# Patient Record
Sex: Female | Born: 1990 | Race: White | Hispanic: No | Smoking: Former smoker
Health system: Southern US, Community
[De-identification: ages and names within clinical notes are randomized; demographics above are authoritative.]

## PROBLEM LIST (undated history)

## (undated) ENCOUNTER — Inpatient Hospital Stay: Payer: Self-pay

## (undated) ENCOUNTER — Inpatient Hospital Stay (HOSPITAL_COMMUNITY): Payer: Self-pay

## (undated) DIAGNOSIS — J45909 Unspecified asthma, uncomplicated: Secondary | ICD-10-CM

## (undated) DIAGNOSIS — D649 Anemia, unspecified: Secondary | ICD-10-CM

## (undated) DIAGNOSIS — R569 Unspecified convulsions: Secondary | ICD-10-CM

## (undated) DIAGNOSIS — F1910 Other psychoactive substance abuse, uncomplicated: Secondary | ICD-10-CM

## (undated) DIAGNOSIS — N809 Endometriosis, unspecified: Secondary | ICD-10-CM

## (undated) DIAGNOSIS — N83209 Unspecified ovarian cyst, unspecified side: Secondary | ICD-10-CM

## (undated) HISTORY — PX: LASER ABLATION/CAUTERIZATION OF ENDOMETRIAL IMPLANTS: SHX1951

## (undated) HISTORY — DX: Anemia, unspecified: D64.9

---
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
## 2010-05-29 ENCOUNTER — Emergency Department: Payer: Self-pay | Admitting: Emergency Medicine

## 2010-07-09 ENCOUNTER — Emergency Department: Payer: Self-pay | Admitting: Emergency Medicine

## 2011-01-29 ENCOUNTER — Emergency Department: Payer: Self-pay | Admitting: Emergency Medicine

## 2011-03-16 ENCOUNTER — Emergency Department: Payer: Self-pay | Admitting: Unknown Physician Specialty

## 2011-05-15 ENCOUNTER — Emergency Department: Payer: Self-pay | Admitting: Emergency Medicine

## 2011-07-06 ENCOUNTER — Ambulatory Visit: Payer: Self-pay | Admitting: Neurology

## 2011-09-15 ENCOUNTER — Emergency Department: Payer: Self-pay | Admitting: Emergency Medicine

## 2011-09-18 ENCOUNTER — Emergency Department: Payer: Self-pay | Admitting: Emergency Medicine

## 2011-09-18 LAB — URINALYSIS, COMPLETE
Bacteria: NONE SEEN
Bilirubin,UR: NEGATIVE
Glucose,UR: NEGATIVE mg/dL (ref 0–75)
Ketone: NEGATIVE
Leukocyte Esterase: NEGATIVE
Ph: 6 (ref 4.5–8.0)
RBC,UR: 3 /HPF (ref 0–5)
Squamous Epithelial: 12
WBC UR: 1 /HPF (ref 0–5)

## 2011-09-18 LAB — DRUG SCREEN, URINE
Amphetamines, Ur Screen: NEGATIVE (ref ?–1000)
Barbiturates, Ur Screen: NEGATIVE (ref ?–200)
Benzodiazepine, Ur Scrn: NEGATIVE (ref ?–200)
Cannabinoid 50 Ng, Ur ~~LOC~~: NEGATIVE (ref ?–50)
Cocaine Metabolite,Ur ~~LOC~~: NEGATIVE (ref ?–300)
Methadone, Ur Screen: NEGATIVE (ref ?–300)
Opiate, Ur Screen: NEGATIVE (ref ?–300)
Phencyclidine (PCP) Ur S: NEGATIVE (ref ?–25)
Tricyclic, Ur Screen: NEGATIVE (ref ?–1000)

## 2011-09-18 LAB — COMPREHENSIVE METABOLIC PANEL
Albumin: 4 g/dL (ref 3.4–5.0)
Alkaline Phosphatase: 83 U/L (ref 50–136)
Anion Gap: 12 (ref 7–16)
Bilirubin,Total: 0.2 mg/dL (ref 0.2–1.0)
Calcium, Total: 8.6 mg/dL (ref 8.5–10.1)
Chloride: 108 mmol/L — ABNORMAL HIGH (ref 98–107)
Co2: 20 mmol/L — ABNORMAL LOW (ref 21–32)
Creatinine: 0.87 mg/dL (ref 0.60–1.30)
EGFR (African American): >60
Glucose: 94 mg/dL (ref 65–99)
SGOT(AST): 20 U/L (ref 15–37)
Total Protein: 7.7 g/dL (ref 6.4–8.2)

## 2011-09-18 LAB — ETHANOL: Ethanol: <3 mg/dL

## 2011-09-18 LAB — CBC
MCHC: 33.6 g/dL (ref 32.0–36.0)
Platelet: 338 10*3/uL (ref 150–440)
WBC: 10.8 10*3/uL (ref 3.6–11.0)

## 2011-10-11 ENCOUNTER — Emergency Department: Payer: Self-pay | Admitting: Emergency Medicine

## 2011-10-20 ENCOUNTER — Emergency Department: Payer: Self-pay | Admitting: Emergency Medicine

## 2012-01-18 ENCOUNTER — Emergency Department: Payer: Self-pay | Admitting: Emergency Medicine

## 2012-02-16 ENCOUNTER — Emergency Department: Payer: Self-pay | Admitting: Emergency Medicine

## 2012-02-16 LAB — URINALYSIS, COMPLETE
Bilirubin,UR: NEGATIVE
Blood: NEGATIVE
Glucose,UR: NEGATIVE mg/dL (ref 0–75)
Ketone: NEGATIVE
Nitrite: NEGATIVE
Ph: 5 (ref 4.5–8.0)
Protein: NEGATIVE
Specific Gravity: 1.026 (ref 1.003–1.030)
Squamous Epithelial: 3

## 2012-06-24 ENCOUNTER — Emergency Department: Payer: Self-pay | Admitting: Emergency Medicine

## 2012-06-24 LAB — CBC WITH DIFFERENTIAL/PLATELET
Basophil %: 0.2 %
HCT: 41.3 % (ref 35.0–47.0)
Lymphocyte #: 2.9 10*3/uL (ref 1.0–3.6)
Lymphocyte %: 26 %
Neutrophil #: 7.5 10*3/uL — ABNORMAL HIGH (ref 1.4–6.5)

## 2012-06-25 LAB — COMPREHENSIVE METABOLIC PANEL
BUN: 15 mg/dL (ref 7–18)
Calcium, Total: 10 mg/dL (ref 8.5–10.1)
Chloride: 106 mmol/L (ref 98–107)
Co2: 25 mmol/L (ref 21–32)
Creatinine: 0.96 mg/dL (ref 0.60–1.30)
EGFR (African American): >60
Osmolality: 279 (ref 275–301)
Potassium: 3.9 mmol/L (ref 3.5–5.1)
Sodium: 140 mmol/L (ref 136–145)
Total Protein: 8.8 g/dL — ABNORMAL HIGH (ref 6.4–8.2)

## 2012-06-25 LAB — URINALYSIS, COMPLETE
Blood: NEGATIVE
Leukocyte Esterase: NEGATIVE
Nitrite: NEGATIVE
Squamous Epithelial: 2
WBC UR: 5 /HPF (ref 0–5)

## 2012-09-07 ENCOUNTER — Emergency Department: Payer: Self-pay | Admitting: Emergency Medicine

## 2012-09-07 LAB — URINALYSIS, COMPLETE
Bilirubin,UR: NEGATIVE
Glucose,UR: NEGATIVE mg/dL (ref 0–75)
Leukocyte Esterase: NEGATIVE
Nitrite: NEGATIVE
Squamous Epithelial: 4

## 2012-09-07 LAB — CBC WITH DIFFERENTIAL/PLATELET
Basophil %: 0.5 %
Eosinophil %: 1.8 %
HGB: 12.2 g/dL (ref 12.0–16.0)
Lymphocyte %: 32.8 %
MCH: 29.1 pg (ref 26.0–34.0)
MCHC: 34.6 g/dL (ref 32.0–36.0)
MCV: 84 fL (ref 80–100)
Monocyte #: 0.6 x10 3/mm (ref 0.2–0.9)
Neutrophil #: 4.1 10*3/uL (ref 1.4–6.5)
Neutrophil %: 56.3 %
Platelet: 257 10*3/uL (ref 150–440)
WBC: 7.2 10*3/uL (ref 3.6–11.0)

## 2012-09-07 LAB — WET PREP, GENITAL

## 2012-10-07 ENCOUNTER — Emergency Department: Payer: Self-pay | Admitting: Emergency Medicine

## 2012-10-07 LAB — PREGNANCY, URINE: Pregnancy Test, Urine: NEGATIVE m[IU]/mL

## 2012-10-07 LAB — URINALYSIS, COMPLETE
Bacteria: NONE SEEN
Bilirubin,UR: NEGATIVE
Glucose,UR: NEGATIVE mg/dL (ref 0–75)
Nitrite: NEGATIVE
Ph: 6 (ref 4.5–8.0)
Protein: NEGATIVE
RBC,UR: 1 /HPF (ref 0–5)
Specific Gravity: 1.02 (ref 1.003–1.030)

## 2012-10-07 LAB — CBC
HGB: 13.2 g/dL (ref 12.0–16.0)
MCH: 29.6 pg (ref 26.0–34.0)
MCHC: 34.5 g/dL (ref 32.0–36.0)
WBC: 8 10*3/uL (ref 3.6–11.0)

## 2012-10-07 LAB — COMPREHENSIVE METABOLIC PANEL
Alkaline Phosphatase: 78 U/L (ref 50–136)
Anion Gap: 3 — ABNORMAL LOW (ref 7–16)
BUN: 13 mg/dL (ref 7–18)
Bilirubin,Total: 0.1 mg/dL — ABNORMAL LOW (ref 0.2–1.0)
Chloride: 107 mmol/L (ref 98–107)
Co2: 28 mmol/L (ref 21–32)
EGFR (Non-African Amer.): >60
Potassium: 3.8 mmol/L (ref 3.5–5.1)

## 2012-10-07 LAB — LIPASE, BLOOD: Lipase: 147 U/L (ref 73–393)

## 2013-03-14 ENCOUNTER — Emergency Department: Payer: Self-pay | Admitting: Emergency Medicine

## 2013-03-14 LAB — URINALYSIS, COMPLETE
Bacteria: NONE SEEN
Bilirubin,UR: NEGATIVE
Glucose,UR: NEGATIVE mg/dL (ref 0–75)
Ketone: NEGATIVE
Nitrite: NEGATIVE
Ph: 5 (ref 4.5–8.0)
Specific Gravity: 1.02 (ref 1.003–1.030)
Squamous Epithelial: 4
WBC UR: 1 /HPF (ref 0–5)

## 2013-03-14 LAB — COMPREHENSIVE METABOLIC PANEL
Alkaline Phosphatase: 76 U/L (ref 50–136)
Calcium, Total: 8.6 mg/dL (ref 8.5–10.1)
Co2: 22 mmol/L (ref 21–32)
Creatinine: 0.8 mg/dL (ref 0.60–1.30)
EGFR (African American): >60
EGFR (Non-African Amer.): >60
Glucose: 104 mg/dL — ABNORMAL HIGH (ref 65–99)
Osmolality: 274 (ref 275–301)
Potassium: 3.8 mmol/L (ref 3.5–5.1)
SGOT(AST): 15 U/L (ref 15–37)
Sodium: 137 mmol/L (ref 136–145)
Total Protein: 7.2 g/dL (ref 6.4–8.2)

## 2013-03-14 LAB — CBC
MCH: 29.5 pg (ref 26.0–34.0)
MCV: 87 fL (ref 80–100)
Platelet: 271 10*3/uL (ref 150–440)
RBC: 4.56 10*6/uL (ref 3.80–5.20)

## 2013-11-20 ENCOUNTER — Emergency Department: Payer: Self-pay | Admitting: Emergency Medicine

## 2013-11-20 LAB — URINALYSIS, COMPLETE
BACTERIA: NONE SEEN
Bilirubin,UR: NEGATIVE
Glucose,UR: NEGATIVE mg/dL (ref 0–75)
KETONE: NEGATIVE
Nitrite: NEGATIVE
Ph: 6 (ref 4.5–8.0)
Protein: 30
SPECIFIC GRAVITY: 1.013 (ref 1.003–1.030)
Squamous Epithelial: 2
WBC UR: 18 /HPF (ref 0–5)

## 2013-11-20 LAB — COMPREHENSIVE METABOLIC PANEL
ALBUMIN: 4 g/dL (ref 3.4–5.0)
ALK PHOS: 83 U/L
Anion Gap: 5 — ABNORMAL LOW (ref 7–16)
BUN: 8 mg/dL (ref 7–18)
Bilirubin,Total: 0.4 mg/dL (ref 0.2–1.0)
CO2: 28 mmol/L (ref 21–32)
CREATININE: 0.81 mg/dL (ref 0.60–1.30)
Calcium, Total: 9.2 mg/dL (ref 8.5–10.1)
Chloride: 105 mmol/L (ref 98–107)
EGFR (African American): >60
Glucose: 82 mg/dL (ref 65–99)
Osmolality: 273 (ref 275–301)
Potassium: 3.9 mmol/L (ref 3.5–5.1)
SGOT(AST): 14 U/L — ABNORMAL LOW (ref 15–37)
SGPT (ALT): 16 U/L (ref 12–78)
SODIUM: 138 mmol/L (ref 136–145)
Total Protein: 7.7 g/dL (ref 6.4–8.2)

## 2013-11-20 LAB — WET PREP, GENITAL

## 2013-11-20 LAB — CBC WITH DIFFERENTIAL/PLATELET
BASOS PCT: 0.4 %
Basophil #: 0 10*3/uL (ref 0.0–0.1)
Eosinophil #: 0.2 10*3/uL (ref 0.0–0.7)
Eosinophil %: 3.1 %
HCT: 42 % (ref 35.0–47.0)
HGB: 14.4 g/dL (ref 12.0–16.0)
Lymphocyte #: 1.9 10*3/uL (ref 1.0–3.6)
Lymphocyte %: 27.1 %
MCH: 30.2 pg (ref 26.0–34.0)
MCHC: 34.4 g/dL (ref 32.0–36.0)
MCV: 88 fL (ref 80–100)
MONO ABS: 0.6 x10 3/mm (ref 0.2–0.9)
MONOS PCT: 8.8 %
NEUTROS ABS: 4.2 10*3/uL (ref 1.4–6.5)
Neutrophil %: 60.6 %
Platelet: 293 10*3/uL (ref 150–440)
RBC: 4.78 10*6/uL (ref 3.80–5.20)
RDW: 13.1 % (ref 11.5–14.5)
WBC: 7 10*3/uL (ref 3.6–11.0)

## 2013-11-20 LAB — GC/CHLAMYDIA PROBE AMP

## 2013-11-20 LAB — HCG, QUANTITATIVE, PREGNANCY

## 2014-07-10 ENCOUNTER — Emergency Department: Payer: Self-pay | Admitting: Emergency Medicine

## 2014-07-10 LAB — URINALYSIS, COMPLETE
BILIRUBIN, UR: NEGATIVE
Bacteria: NONE SEEN
Blood: NEGATIVE
Glucose,UR: NEGATIVE mg/dL (ref 0–75)
KETONE: NEGATIVE
Leukocyte Esterase: NEGATIVE
NITRITE: NEGATIVE
PH: 6 (ref 4.5–8.0)
Protein: NEGATIVE
Specific Gravity: 1.019 (ref 1.003–1.030)
Squamous Epithelial: 5

## 2014-08-07 ENCOUNTER — Emergency Department: Payer: Self-pay | Admitting: Internal Medicine

## 2014-09-24 ENCOUNTER — Emergency Department: Admit: 2014-09-24 | Disposition: A | Discharge status: Home / Self Care | Payer: Self-pay | Admitting: Student

## 2014-09-29 ENCOUNTER — Emergency Department (HOSPITAL_COMMUNITY)
Admission: EM | Admit: 2014-09-29 | Discharge: 2014-09-30 | Discharge status: Against Medical Advice | Payer: Self-pay | Attending: Emergency Medicine | Admitting: Emergency Medicine

## 2014-09-29 ENCOUNTER — Encounter (HOSPITAL_COMMUNITY): Payer: Self-pay | Admitting: Emergency Medicine

## 2014-09-29 DIAGNOSIS — Z720 Tobacco use: Secondary | ICD-10-CM | POA: Insufficient documentation

## 2014-09-29 DIAGNOSIS — R509 Fever, unspecified: Secondary | ICD-10-CM | POA: Insufficient documentation

## 2014-09-29 DIAGNOSIS — R109 Unspecified abdominal pain: Secondary | ICD-10-CM | POA: Insufficient documentation

## 2014-09-29 DIAGNOSIS — R1110 Vomiting, unspecified: Secondary | ICD-10-CM | POA: Insufficient documentation

## 2014-09-29 HISTORY — DX: Unspecified ovarian cyst, unspecified side: N83.209

## 2014-09-29 HISTORY — DX: Endometriosis, unspecified: N80.9

## 2014-09-29 LAB — URINALYSIS, ROUTINE W REFLEX MICROSCOPIC
BILIRUBIN URINE: NEGATIVE
GLUCOSE, UA: NEGATIVE mg/dL
KETONES UR: NEGATIVE mg/dL
Nitrite: POSITIVE — AB
PH: 5.5 (ref 5.0–8.0)
Protein, ur: NEGATIVE mg/dL
Specific Gravity, Urine: 1.016 (ref 1.005–1.030)
UROBILINOGEN UA: 1 mg/dL (ref 0.0–1.0)

## 2014-09-29 LAB — URINE MICROSCOPIC-ADD ON

## 2014-09-29 LAB — COMPREHENSIVE METABOLIC PANEL
ALBUMIN: 2.8 g/dL — AB (ref 3.5–5.2)
ALK PHOS: 80 U/L (ref 39–117)
ALT: 17 U/L (ref 0–35)
AST: 20 U/L (ref 0–37)
Anion gap: 11 (ref 5–15)
BUN: 7 mg/dL (ref 6–23)
CO2: 25 mmol/L (ref 19–32)
Calcium: 8.4 mg/dL (ref 8.4–10.5)
Chloride: 98 mmol/L (ref 96–112)
Creatinine, Ser: 1.1 mg/dL (ref 0.50–1.10)
GFR calc Af Amer: 81 mL/min — ABNORMAL LOW (ref >90)
GFR calc non Af Amer: 70 mL/min — ABNORMAL LOW (ref >90)
GLUCOSE: 78 mg/dL (ref 70–99)
Potassium: 3.4 mmol/L — ABNORMAL LOW (ref 3.5–5.1)
SODIUM: 134 mmol/L — AB (ref 135–145)
Total Bilirubin: 0.5 mg/dL (ref 0.3–1.2)
Total Protein: 6.4 g/dL (ref 6.0–8.3)

## 2014-09-29 LAB — CBC WITH DIFFERENTIAL/PLATELET
BASOS PCT: 0 % (ref 0–1)
Basophils Absolute: 0 10*3/uL (ref 0.0–0.1)
EOS ABS: 0 10*3/uL (ref 0.0–0.7)
Eosinophils Relative: 0 % (ref 0–5)
HEMATOCRIT: 32.2 % — AB (ref 36.0–46.0)
Hemoglobin: 11.2 g/dL — ABNORMAL LOW (ref 12.0–15.0)
LYMPHS PCT: 13 % (ref 12–46)
Lymphs Abs: 2.1 10*3/uL (ref 0.7–4.0)
MCH: 28.3 pg (ref 26.0–34.0)
MCHC: 34.8 g/dL (ref 30.0–36.0)
MCV: 81.3 fL (ref 78.0–100.0)
MONOS PCT: 10 % (ref 3–12)
Monocytes Absolute: 1.7 10*3/uL — ABNORMAL HIGH (ref 0.1–1.0)
NEUTROS PCT: 77 % (ref 43–77)
Neutro Abs: 12.5 10*3/uL — ABNORMAL HIGH (ref 1.7–7.7)
PLATELETS: 305 10*3/uL (ref 150–400)
RBC: 3.96 MIL/uL (ref 3.87–5.11)
RDW: 13.5 % (ref 11.5–15.5)
WBC: 16.3 10*3/uL — ABNORMAL HIGH (ref 4.0–10.5)

## 2014-09-29 LAB — POC URINE PREG, ED: Preg Test, Ur: NEGATIVE

## 2014-09-29 MED ORDER — ONDANSETRON 4 MG PO TBDP
8.0000 mg | ORAL_TABLET | Freq: Once | ORAL | Status: AC
  Administered 2014-09-29: 8 mg via ORAL

## 2014-09-29 MED ORDER — ONDANSETRON 4 MG PO TBDP
ORAL_TABLET | ORAL | Status: AC
  Filled 2014-09-29: qty 2

## 2014-09-29 NOTE — ED Notes (Signed)
Pt reports right flank pain, n/v, "stinky" urine, and fevers- as high as 103.3F x 5 days; pt denies diarrhea; pt reports antipyretics have been effective in reducing fevers- pt afebrile in triage;

## 2014-10-04 ENCOUNTER — Inpatient Hospital Stay
Admit: 2014-10-04 | Disposition: A | Discharge status: Home / Self Care | Payer: Self-pay | Attending: Internal Medicine | Admitting: Internal Medicine

## 2014-10-04 LAB — CBC WITH DIFFERENTIAL/PLATELET
BASOS ABS: 0 10*3/uL (ref 0.0–0.1)
Basophil %: 0.3 %
Eosinophil #: 0.1 10*3/uL (ref 0.0–0.7)
Eosinophil %: 1.2 %
HCT: 34.3 % — AB (ref 35.0–47.0)
HGB: 11.5 g/dL — AB (ref 12.0–16.0)
Lymphocyte #: 1.6 10*3/uL (ref 1.0–3.6)
Lymphocyte %: 17.4 %
MCH: 28.2 pg (ref 26.0–34.0)
MCHC: 33.4 g/dL (ref 32.0–36.0)
MCV: 84 fL (ref 80–100)
MONOS PCT: 7.2 %
Monocyte #: 0.7 x10 3/mm (ref 0.2–0.9)
NEUTROS ABS: 6.9 10*3/uL — AB (ref 1.4–6.5)
Neutrophil %: 73.9 %
Platelet: 500 10*3/uL — ABNORMAL HIGH (ref 150–440)
RBC: 4.07 10*6/uL (ref 3.80–5.20)
RDW: 14.9 % — ABNORMAL HIGH (ref 11.5–14.5)
WBC: 9.4 10*3/uL (ref 3.6–11.0)

## 2014-10-04 LAB — URINALYSIS, COMPLETE
Bilirubin,UR: NEGATIVE
GLUCOSE, UR: NEGATIVE mg/dL (ref 0–75)
Ketone: NEGATIVE
NITRITE: POSITIVE
Ph: 6 (ref 4.5–8.0)
Protein: NEGATIVE
SPECIFIC GRAVITY: 1.014 (ref 1.003–1.030)

## 2014-10-04 LAB — COMPREHENSIVE METABOLIC PANEL
ALK PHOS: 79 U/L
ALT: 24 U/L
ANION GAP: 7 (ref 7–16)
Albumin: 3.4 g/dL — ABNORMAL LOW
BILIRUBIN TOTAL: 0.4 mg/dL
BUN: 10 mg/dL
CO2: 27 mmol/L
Calcium, Total: 8.8 mg/dL — ABNORMAL LOW
Chloride: 103 mmol/L
Creatinine: 0.81 mg/dL
Glucose: 89 mg/dL
Potassium: 4.2 mmol/L
SGOT(AST): 18 U/L
SODIUM: 137 mmol/L
Total Protein: 7.9 g/dL

## 2014-10-04 LAB — LIPASE, BLOOD: Lipase: 23 U/L

## 2014-10-04 LAB — PREGNANCY, URINE: Pregnancy Test, Urine: NEGATIVE m[IU]/mL

## 2014-10-05 LAB — CBC WITH DIFFERENTIAL/PLATELET
BASOS PCT: 0.2 %
Basophil #: 0 10*3/uL (ref 0.0–0.1)
Eosinophil #: 0.1 10*3/uL (ref 0.0–0.7)
Eosinophil %: 1.1 %
HCT: 29.9 % — AB (ref 35.0–47.0)
HGB: 9.9 g/dL — AB (ref 12.0–16.0)
LYMPHS PCT: 15.2 %
Lymphocyte #: 1.9 10*3/uL (ref 1.0–3.6)
MCH: 27.8 pg (ref 26.0–34.0)
MCHC: 33.2 g/dL (ref 32.0–36.0)
MCV: 84 fL (ref 80–100)
MONOS PCT: 7.2 %
Monocyte #: 0.9 x10 3/mm (ref 0.2–0.9)
NEUTROS ABS: 9.5 10*3/uL — AB (ref 1.4–6.5)
Neutrophil %: 76.3 %
Platelet: 383 10*3/uL (ref 150–440)
RBC: 3.57 10*6/uL — ABNORMAL LOW (ref 3.80–5.20)
RDW: 14.1 % (ref 11.5–14.5)
WBC: 12.4 10*3/uL — ABNORMAL HIGH (ref 3.6–11.0)

## 2014-10-05 LAB — MAGNESIUM: Magnesium: 1.8 mg/dL

## 2014-10-06 LAB — URINE CULTURE

## 2014-10-07 LAB — CREATININE, SERUM: Creatinine: 0.75 mg/dL

## 2014-10-07 LAB — WBC: WBC: 12.3 10*3/uL — ABNORMAL HIGH (ref 3.6–11.0)

## 2014-10-08 LAB — URINE CULTURE

## 2014-10-21 NOTE — H&P (Signed)
PATIENT NAME:  Colleen Pearson, RITTHALER MR#:  [id number] DATE OF BIRTH:  [birth year]  DATE OF ADMISSION:  10/04/2014   PRIMARY CARE PHYSICIAN:  Not on call.  REFERRING PHYSICIAN:  Janalyn Harder, M.D.   CHIEF COMPLAINT:  Right abdominal pain for 4 to 5 days.   HISTORY OF PRESENT ILLNESS: A 24 year old Caucasian female with a history of asthma and a seizure disorder was brought to the ED due to right side abdominal pain.  The patient is alert, awake, and oriented in no acute distress.  The patient said that she has had abdominal pain on the right side for the past 4 to 5 days which is crampy, intermittent, with possible radiation to her right flank.  The patient denies any hematuria, dysuria, or incontinence.  Denies any fever or chills.  The patient got a CAT scan of the abdomen which showed possible pyelonephritis with possible kidney abscess.  The patient's urinalysis showed UTI.  The patient is being admitted for pyelonephritis.   PAST MEDICAL HISTORY:  Seizure disorder, asthma, bipolar disorder, and endometriosis.   SOCIAL HISTORY:  Smokes half pack a day for 5 years.  Denies any alcohol drinking or illicit drugs.  ALLERGIES:  LAMOTRIGINE, NAPROXEN AND TRAMADOL.    HOME MEDICATIONS:  Medication reconciliation is not done yet.  We will update.    REVIEW OF SYSTEMS: CONSTITUTIONAL:  The patient denies any fever or chills.  No headache, dizziness, or weakness.  EYES:  No double vision or blurred vision.  EARS, NOSE, AND THROAT:  No postnasal drip or facial dysphagia.  CARDIOVASCULAR:  No chest pain, palpitation, orthopnea, or nocturnal dyspnea.  No leg edema.  PULMONARY:  No cough, sputum, shortness of breath, or hemoptysis.  GASTROINTESTINAL:  Positive for abdominal pain.  No nausea, vomiting, diarrhea.  No bloody stool.  No melena.  GENITOURINARY:  No dysuria, hematuria, or incontinence, but has right flank pain.  NEUROLOGY:  No syncope, loss of consciousness, or seizure.  ENDOCRINOLOGY:  No  polyuria, polydipsia, heat or cold intolerance.  HEMATOLOGY:  No easy bruising or bleeding.   PHYSICAL EXAMINATION:  VITAL SIGNS:  Temperature 98.4, blood pressure 116/67, pulse was 113 and decreased to 93, oxygen saturation 100% on room air.  GENERAL:  The patient is alert, awake, oriented, in no acute distress.  HEENT:  Pupils round and equal and reactive to light and accommodation.  Moist oral mucosa. Clear pharynx.  NECK:  Supple. No JVD or carotid bruit. No lymphadenopathy.  No thyromegaly.  CARDIOVASCULAR:  S1 and S2.  Regular rate and rhythm.  No murmurs.   PULMONARY:  Bilateral air entry.  No wheezing or rales.  No use of axillary muscles to breathe.  ABDOMEN:  Soft.  Tenderness on the right middle side.  No rigidity.  No rebound.  Bowel sounds are present.  No organomegaly.  Right flank tenderness.  EXTREMITIES:  No edema, clubbing or cyanosis.  No calf tenderness, bilateral pedal pulses are present.  SKIN: No rash or jaundice.  NEUROLOGY:  Alert and oriented x 3.  No focal deficit.   Power 5/5.  Sensation intact.   LABORATORY DATA:  CAT scan of the abdomen and pelvis with contrast showed a 3.9 cm complex cystic lesion in the interpolar  right kidney new from 2015  worrisome for  pyelonephritis with associated perirenal abscess.    Urinalysis showed nitrate- positive.   WBCs 6 to 30,  RBCs 0 to 5.  CBC showed WBC  9.4, hemoglobin 11.5, platelets 500,000.  Glucose 89, BUN 10, creatinine 0.81, electrolytes normal.  Pregnancy test is negative.   IMPRESSIONS:  1.  Acute pyelonephritis.  2.  Possible right kidney abscess.  3.  History of asthma and seizure disorder.   PLAN OF TREATMENT:  1.  The patient was treated with Rocephin in the ED. Will continue Rocephin.  Followup urine culture and complete blood count.  2.  We will get a urology consult.  3.  Pain control with Zofran p.r.n.  4.  Start normal saline IV support.  5.  For tobacco abuse, the patient was counseled for smoking  cessation for three meetings.  Will give a nicotine patch.  Discussed the patient's condition and plan of treatment with the patient.   TIME SPENT:  About 53 minutes.    ____________________________ Shaune PollackQing Aria Jarrard, MD [phone] D: 10/04/2014 21:52:00 ET T: 10/04/2014 22:47:09 ET JOB#: [id number]  cc: Shaune PollackQing Isidro Monks, MD, <Dictator> Shaune PollackQING Jaquari Reckner MD ELECTRONICALLY SIGNED 10/05/2014 16:43

## 2014-10-21 NOTE — Consult Note (Signed)
Chief Complaint:  Subjective/Chief Complaint Colleen Pearson is having less pain today but still had some chills through the night.  She reports no voiding complaints but the urine remains malodorous.    ROS: - nausea, - hematuria   Brief Assessment:  GEN well developed, well nourished, no acute distress   Gastrointestinal Soft with reduced RUQ tenderness   Additional Physical Exam She was afebrile with VSS at her last check prior to MN   Assessment/Plan:  Assessment/Plan:  Assessment She remains afebrile and reports reduced pain on the rocephin and vancomycin.   AM labs and final culture report pending.   Her Abscess is just over 3cm which of further review of recommended therapy is borderline size for percutaneous drainage.   Plan Since she is responding to therapy, I would continue current meds pending the culture and then convert to appropriate oral antibiotics. She will need treatment for 4 weeks and not 6 weeks as I originally recommended and if she continues to improve follow up imaging in a week is recommended.    I will send her information to Dr. Apolinar Pearson so that she can be followed at [facility] post discharge.   Electronic Signatures: Anner CreteWrenn, John J (MD)  (Signed 16-Apr-16 06:40)  Authored: Chief Complaint, Brief Assessment, Assessment/Plan   Last Updated: 16-Apr-16 06:40 by Anner CreteWrenn, John J (MD)

## 2014-10-21 NOTE — Consult Note (Signed)
PATIENT NAME:  Colleen Pearson, Colleen Pearson MR#:  [id number] DATE OF BIRTH:  [birth year]  DATE OF CONSULTATION:  10/05/2014  REFERRING PHYSICIAN:   CONSULTING PHYSICIAN:  Excell SeltzerJohn Pearson. Annabell HowellsWrenn, MD  CHIEF COMPLAINT: Right flank pain.  HISTORY OF PRESENT ILLNESS: Ms. Cleophas DunkerWhitfield is a 24 year old white female who I was asked to see in consultation by Dr. Shaune PollackQing Chen for right polynephritis with probable abscess.   Colleen Pearson had the onset, 6 days ago, of right flank pain with fevers to 103 that Colleen Pearson managed with ibuprofen Colleen Pearson presented to the Emergency Room today, was found to have evidence of urinary tract infection with the nitrite-positive urine with 3-6 white cells per high-powered field, a white count of 9.4 and hemoglobin of 11.1 and creatinine was 0.81. A CT without contrast was obtained, which reveals multifocal low-density areas in the right kidney that are suggestive for renal abscess. No stones or obstructions were identified.   Colleen Pearson had no hematuria, dysuria, or incontinence, but has had malodorous urine. Colleen Pearson has had some nausea and vomiting today. Colleen Pearson has no prior history of polynephritis or stones, but did have a prior CT scan in June of last year, which showed no findings of cystic lesions in the right kidney. Her fever has abated on Rocephin since admission.   REVIEW OF SYSTEMS:  CONSTITUTIONAL: Colleen Pearson has had no headache. Colleen Pearson has had the fever as noted, but that has resolved. Colleen Pearson has had nausea with vomiting today. Colleen Pearson has had the malodorous urine and the right flank pain, which can be severe. Colleen Pearson denies any diarrhea or constipation. Colleen Pearson has had no lower extremity tenderness or edema. Colleen Pearson has had no recent seizures or other neurologic complaints. Colleen Pearson is otherwise entirely without complaints on a 12-point review of systems, except as noted above.  PAST MEDICAL HISTORY PERTINENT FOR:  ALLERGIES: LAMOTRIGINE, NAPROXEN, AND TRAMADOL.   HOME MEDICATIONS: Included ibuprofen only.   HOSPITAL MEDICATIONS: Colleen Pearson is  currently on Rocephin.  PAST MEDICAL HISTORY: Pertinent for seizure disorder with no recent activity, asthma, bipolar disorder, and endometriosis.  PAST SURGICAL HISTORY:  Significant for prior laparoscopy with laser treatment of endometriosis. Colleen Pearson is gravida 0, para 0.   SOCIAL HISTORY: Colleen Pearson is a 1/2 pack a day smoker for 5 years. Colleen Pearson denies alcohol or recreational drugs. In particular, Colleen Pearson denies injectable drugs.   FAMILY HISTORY: Unremarkable.  PHYSICAL EXAMINATION:  VITAL SIGNS:  Temperature 98.6, heart rate 107, respiration 18, blood pressure 127/79. GENERAL: Colleen Pearson is a well-developed, well-nourished white female who is currently having a chill and appears uncomfortable when Colleen Pearson moves, but is otherwise in no acute distress. Colleen Pearson is alert and oriented x 3.  HEAD AND FACE: Normocephalic, atraumatic.  NECK: Supple without thyromegaly or bruits. LUNGS: Clear with normal effort.  HEART: Tachycardic without murmur.  ABDOMEN: Soft, flat, with marked right upper quadrant and right CVA tenderness with some rebound tenderness on the left. No mass or hepatosplenomegaly are noted. No hernias are noted.  GENITOURINARY:AND RECTAL: Not performed.  EXTREMITIES: Full range of motion with no calf tenderness or edema.  SKIN: Warm and dry. Colleen Pearson has tattoos on the chest.  NEUROLOGICAL: No focal deficits.   IMAGING: I have reviewed her CT films and report, and Colleen Pearson has a 3.9 x 3 x 2.6 cm regular septated cystic lesion in inner pole of right kidney with additional smaller cystic lesion in the right kidney with heterogenous perfusion of the right kidney, which is concerning for polynephritis with an inner polar perirenal abscess, as noted,  and no hydronephrosis or stones were noted. The left kidney is unremarkable. The current film was compared with the study from 2015.   LABORATORY STUDIES: Demonstrates a CMP that is normal with the exception of a calcium of 8.8 and an albumin of 3.4. White count is 12.4 with a  hemoglobin of 9.9, hematocrit of 29.9, and increased neutrophils. This is an increase in the white count from 9.4 on admission, yesterday afternoon, and hemoglobin is down with hydration from 11.5. A urine culture currently has colonies too small to read. The urinalysis noted previously, urine pregnancy test is negative.   IMPRESSION: Right polynephritis with a renal abscess.  RECOMMENDATIONS: At this time, pending the culture, I would broaden her antibiotic coverage to include vancomycin to ensure gram-positive coverage. Hopefully, we will get more definitive bacteriological findings with the culture going forward. I would recommend continuing antibiotic therapy with an appropriate oral agent for approximately 6 weeks and would recommend a repeat imaging in appropriately a week, unless her clinical condition worsens. A renal ultrasound would be appropriate for follow-up imaging. I will continue to monitor her and will be back in over the weekend on Sunday.     ____________________________ Excell Seltzer. Annabell Howells, MD [id number] D: 10/05/2014 18:06:40 ET T: 10/05/2014 18:48:19 ET JOB#: [id number]  cc: Excell Seltzer. Annabell Howells, MD, <Dictator> Excell Seltzer [facility] MD ELECTRONICALLY SIGNED 10/06/2014 7:15

## 2014-10-22 NOTE — Discharge Summary (Signed)
PATIENT NAME:  Colleen BanisterWHITFIELD, Heran J MR#:  [id number] DATE OF BIRTH:  1991/02/10  DATE OF ADMISSION:  10/04/2014 DATE OF DISCHARGE:  10/07/2014  PRESENTING COMPLAINT:  Abdominal pain.   DISCHARGE DIAGNOSES:  1.  Right-sided pyelonephritis with approximately 3 cm renal abscess.   2.  FULL CODE.  MEDICATIONS:  1.  Bactrim DS 1 tablet twice daily.   2.  Acetaminophen/hydrocodone 5/325 1 every four hours as needed.   DISCHARGE INSTRUCTIONS:   1.  Followup with Dr. Vanna ScotlandAshley Brandon first available appointment for right renal abscess.  2.  The patient advised to call ultrasound to get a renal ultrasound done on April 20.  Phone number was given to call for appointment.  3.  Urology consultation  Dr. Bjorn PippinJohn Wrenn.    DIAGNOSTIC DATA:  White count was 12.3.  Urine culture no growth in 36 hours.  CT of the abdomen and pelvis with contrast showed 3.9 cm complex cystic lesion.  CT of the abdomen showed 3.9 cm complex cystic lesion in the interpolar right kidney which is new from 2015.   BRIEF HISTORY:  Nelida MeuseKelsey Kustra is a 24 year old Caucasian female who comes in with abdominal pain.  She was diagnosed with:  1.  Acute pyelonephritis with renal abscess with questionable renal complex cyst with questionable abscess on the right side.  She was started on IV Rocephin and vancomycin, changed to oral Bactrim which she will need at least for 4 weeks.  This was discussed with Dr. Annabell HowellsWrenn, urology, who recommends followup renal ultrasound appointment.  Information was given to the patient and followup with Dr. Apolinar JunesBrandon as outpatient .  Blood cultures, urine cultures were negative.  2.  History of asthma and seizure disorder not on any medications.  She has been seizure-free for the last two years.  Hospital stay otherwise remains stable.  She remained a FULL CODE.     TIME SPENT:   40 minutes. TIME SPENT: 40 minutes and CM  ____________________________ Jearl KlinefelterSona A. Allena KatzPatel, MD [id number] D: 10/19/2014 14:20:57  ET T: 10/19/2014 21:05:21 ET JOB#: [id number]  cc: Shamari Lofquist A. Allena KatzPatel, MD, <Dictator> Excell SeltzerJohn J. Annabell HowellsWrenn, MD Claris GladdenAshley J. Brandon, MD Willow OraSONA A Neil Brickell MD ELECTRONICALLY SIGNED 10/20/2014 15:11

## 2015-01-24 ENCOUNTER — Encounter: Payer: Self-pay | Admitting: Urgent Care

## 2015-01-24 ENCOUNTER — Emergency Department: Payer: Self-pay

## 2015-01-24 ENCOUNTER — Emergency Department
Admission: EM | Admit: 2015-01-24 | Discharge: 2015-01-24 | Disposition: A | Discharge status: Home / Self Care | Payer: Self-pay | Attending: Emergency Medicine | Admitting: Emergency Medicine

## 2015-01-24 DIAGNOSIS — Z720 Tobacco use: Secondary | ICD-10-CM | POA: Insufficient documentation

## 2015-01-24 DIAGNOSIS — Z3202 Encounter for pregnancy test, result negative: Secondary | ICD-10-CM | POA: Insufficient documentation

## 2015-01-24 DIAGNOSIS — R319 Hematuria, unspecified: Secondary | ICD-10-CM | POA: Insufficient documentation

## 2015-01-24 DIAGNOSIS — R109 Unspecified abdominal pain: Secondary | ICD-10-CM | POA: Insufficient documentation

## 2015-01-24 DIAGNOSIS — Z880 Allergy status to penicillin: Secondary | ICD-10-CM | POA: Insufficient documentation

## 2015-01-24 HISTORY — DX: Unspecified asthma, uncomplicated: J45.909

## 2015-01-24 LAB — POCT PREGNANCY, URINE: Preg Test, Ur: NEGATIVE

## 2015-01-24 LAB — URINALYSIS COMPLETE WITH MICROSCOPIC (ARMC ONLY)
Bilirubin Urine: NEGATIVE
Glucose, UA: NEGATIVE mg/dL
Ketones, ur: NEGATIVE mg/dL
Leukocytes, UA: NEGATIVE
Nitrite: NEGATIVE
PH: 5 (ref 5.0–8.0)
PROTEIN: 30 mg/dL — AB
SPECIFIC GRAVITY, URINE: 1.02 (ref 1.005–1.030)

## 2015-01-24 LAB — BASIC METABOLIC PANEL
Anion gap: 5 (ref 5–15)
BUN: 13 mg/dL (ref 6–20)
CO2: 25 mmol/L (ref 22–32)
Calcium: 8.9 mg/dL (ref 8.9–10.3)
Chloride: 109 mmol/L (ref 101–111)
Creatinine, Ser: 0.92 mg/dL (ref 0.44–1.00)
GFR calc Af Amer: >60 mL/min (ref >60)
Glucose, Bld: 98 mg/dL (ref 65–99)
Potassium: 3.8 mmol/L (ref 3.5–5.1)
Sodium: 139 mmol/L (ref 135–145)

## 2015-01-24 LAB — CBC
HCT: 36 % (ref 35.0–47.0)
Hemoglobin: 12.4 g/dL (ref 12.0–16.0)
MCH: 28.7 pg (ref 26.0–34.0)
MCHC: 34.5 g/dL (ref 32.0–36.0)
MCV: 83.2 fL (ref 80.0–100.0)
Platelets: 332 10*3/uL (ref 150–440)
RBC: 4.33 MIL/uL (ref 3.80–5.20)
RDW: 13.4 % (ref 11.5–14.5)
WBC: 8.8 10*3/uL (ref 3.6–11.0)

## 2015-01-24 MED ORDER — ONDANSETRON HCL 4 MG/2ML IJ SOLN
INTRAMUSCULAR | Status: AC
  Administered 2015-01-24: 4 mg via INTRAVENOUS
  Filled 2015-01-24: qty 2

## 2015-01-24 MED ORDER — KETOROLAC TROMETHAMINE 10 MG PO TABS: 10.0000 mg | ORAL_TABLET | Freq: Once | ORAL | Status: DC

## 2015-01-24 MED ORDER — KETOROLAC TROMETHAMINE 30 MG/ML IJ SOLN
30.0000 mg | Freq: Once | INTRAMUSCULAR | Status: AC
  Administered 2015-01-24: 30 mg via INTRAVENOUS
  Filled 2015-01-24: qty 1

## 2015-01-24 MED ORDER — KETOROLAC TROMETHAMINE 10 MG PO TABS
10.0000 mg | ORAL_TABLET | Freq: Three times a day (TID) | ORAL | Status: DC | PRN
Start: 2015-01-24 — End: 2017-07-31

## 2015-01-24 MED ORDER — MORPHINE SULFATE 2 MG/ML IJ SOLN
2.0000 mg | Freq: Once | INTRAMUSCULAR | Status: AC
  Administered 2015-01-24: 2 mg via INTRAVENOUS

## 2015-01-24 MED ORDER — MORPHINE SULFATE 2 MG/ML IJ SOLN
INTRAMUSCULAR | Status: AC
  Administered 2015-01-24: 2 mg via INTRAVENOUS
  Filled 2015-01-24: qty 1

## 2015-01-24 MED ORDER — ONDANSETRON HCL 4 MG/2ML IJ SOLN
4.0000 mg | Freq: Once | INTRAMUSCULAR | Status: AC
  Administered 2015-01-24: 4 mg via INTRAVENOUS

## 2015-01-24 NOTE — ED Notes (Signed)
Patient presents with c/o RIGHT flank pain with (+) radiation into RLQ of abd. (+) dysuria reported. (+) N/V. Of note, patient reports recent admission for the same.

## 2015-01-24 NOTE — ED Provider Notes (Signed)
[facility] Emergency Department Provider Note  ____________________________________________  Time seen: 2:30 AM  I have reviewed the triage vital signs and the nursing notes.   HISTORY  Chief Complaint Dysuria     HPI Colleen Pearson is a 24 y.o. female presents with progressive right flank pain times one week accompanied by nausea. Patient denies any fever denies any dysuria.     Past Medical History  Diagnosis Date  . Ovarian cyst   . Endometriosis   . Asthma     There are no active problems to display for this patient.   Past Surgical History  Procedure Laterality Date  . Laser ablation/cauterization of endometrial implants      No current outpatient prescriptions on file.  Allergies Penicillins and Tramadol  No family history on file.  Social History History  Substance Use Topics  . Smoking status: Current Every Day Smoker -- 1.00 packs/day    Types: Cigarettes  . Smokeless tobacco: Not on file  . Alcohol Use: No    Review of Systems  Constitutional: Negative for fever. Eyes: Negative for visual changes. ENT: Negative for sore throat. Cardiovascular: Negative for chest pain. Respiratory: Negative for shortness of breath. Gastrointestinal: Negative for abdominal pain, vomiting and diarrhea. Positive for right flank pain Genitourinary: Negative for dysuria. Positive for hematuria Musculoskeletal: Negative for back pain. Skin: Negative for rash. Neurological: Negative for headaches, focal weakness or numbness.   10-point ROS otherwise negative.  ____________________________________________   PHYSICAL EXAM:  VITAL SIGNS: ED Triage Vitals  Enc Vitals Group     BP 01/24/15 0145 121/53 mmHg     Pulse Rate 01/24/15 0145 60     Resp 01/24/15 0145 20     Temp 01/24/15 0145 98 F (36.7 C)     Temp Source 01/24/15 0145 Oral     SpO2 01/24/15 0145 98 %     Weight 01/24/15 0145 125 lb (56.7 kg)     Height 01/24/15  0145 5\' 1"  (1.549 m)     Head Cir --      Peak Flow --      Pain Score 01/24/15 0149 8     Pain Loc --      Pain Edu? --      Excl. in GC? --     Constitutional: Alert and oriented. Well appearing and in no distress. Eyes: Conjunctivae are normal. PERRL. Normal extraocular movements. ENT   Head: Normocephalic and atraumatic.   Nose: No congestion/rhinnorhea.   Mouth/Throat: Mucous membranes are moist.   Neck: No stridor.. Cardiovascular: Normal rate, regular rhythm. Normal and symmetric distal pulses are present in all extremities. No murmurs, rubs, or gallops. Respiratory: Normal respiratory effort without tachypnea nor retractions. Breath sounds are clear and equal bilaterally. No wheezes/rales/rhonchi. Gastrointestinal: Soft and nontender. No distention. There is no CVA tenderness. Genitourinary: deferred Musculoskeletal: Nontender with normal range of motion in all extremities. No joint effusions.  No lower extremity tenderness nor edema. Neurologic:  Normal speech and language. No gross focal neurologic deficits are appreciated. Speech is normal.  Skin:  Skin is warm, dry and intact. No rash noted. Psychiatric: Mood and affect are normal. Speech and behavior are normal. Patient exhibits appropriate insight and judgment.  ____________________________________________    LABS (pertinent positives/negatives)  Labs Reviewed  URINALYSIS COMPLETEWITH MICROSCOPIC ([facility] ONLY) - Abnormal; Notable for the following:    Color, Urine YELLOW (*)    APPearance CLEAR (*)    Hgb urine dipstick 3+ (*)  Protein, ur 30 (*)    Bacteria, UA RARE (*)    Squamous Epithelial / LPF 0-5 (*)    All other components within normal limits  BASIC METABOLIC PANEL  CBC  POC URINE PREG, ED  POCT PREGNANCY, URINE       RADIOLOGY  CT scan of the abdomen revealed  ______ IMPRESSION: 1. No urinary calculi. No hydronephrosis or ureteral dilatation. 2. Normal appendix 3. No acute  findings are evident in the abdomen or pelvis 4. The complex right renal cystic lesion observed on 10/04/2014 is not visible on this unenhanced scan.   Electronically Signed By: Ellery Plunk M.D. On: 01/24/2015 03:30_______________  INITIAL IMPRESSION / ASSESSMENT AND PLAN / ED COURSE  Pertinent labs & imaging results that were available during my care of the patient were reviewed by me and considered in my medical decision making (see chart for details).  Patient with noted hematuria in her urine sample however CT scan of the abdomen did not reveal any potential etiology for the patient's hematuria as such patient will be referred to the urologist  ____________________________________________   FINAL CLINICAL IMPRESSION(S) / ED DIAGNOSES  Final diagnoses:  Right flank pain  Hematuria      Darci Current, MD 01/24/15 [phone]

## 2015-01-24 NOTE — Discharge Instructions (Signed)

## 2017-03-11 ENCOUNTER — Emergency Department
Admission: EM | Admit: 2017-03-11 | Discharge: 2017-03-11 | Disposition: A | Discharge status: Home / Self Care | Payer: Self-pay | Attending: Emergency Medicine | Admitting: Emergency Medicine

## 2017-03-11 ENCOUNTER — Emergency Department: Payer: Self-pay

## 2017-03-11 ENCOUNTER — Encounter: Payer: Self-pay | Admitting: Emergency Medicine

## 2017-03-11 DIAGNOSIS — J40 Bronchitis, not specified as acute or chronic: Secondary | ICD-10-CM | POA: Insufficient documentation

## 2017-03-11 DIAGNOSIS — F17210 Nicotine dependence, cigarettes, uncomplicated: Secondary | ICD-10-CM | POA: Insufficient documentation

## 2017-03-11 DIAGNOSIS — R0981 Nasal congestion: Secondary | ICD-10-CM | POA: Insufficient documentation

## 2017-03-11 MED ORDER — BENZONATATE 100 MG PO CAPS: 100.0000 mg | ORAL_CAPSULE | Freq: Three times a day (TID) | ORAL | 0 refills | Status: DC | PRN

## 2017-03-11 MED ORDER — NAPROXEN 500 MG PO TABS: 500.0000 mg | ORAL_TABLET | Freq: Two times a day (BID) | ORAL | Status: DC

## 2017-03-11 MED ORDER — SULFAMETHOXAZOLE-TRIMETHOPRIM 800-160 MG PO TABS: 1.0000 | ORAL_TABLET | Freq: Two times a day (BID) | ORAL | 0 refills | Status: DC

## 2017-03-11 NOTE — ED Triage Notes (Signed)
Pt c/o cough and congestion x 5-7 days. Taking otc but states not helping

## 2017-03-11 NOTE — ED Provider Notes (Signed)
[facility] Emergency Department Provider Note   ____________________________________________   First MD Initiated Contact with Patient 03/11/17 1159     (approximate)  I have reviewed the triage vital signs and the nursing notes.   HISTORY  Chief Complaint Nasal Congestion and Cough    HPI Colleen Pearson is a 26 y.o. female his complaint of cough congestion for 1 week. Patient stated no relief over-the-counter medications. Patient complaining of productive cough and chest wall pain. Patient stated intermittent fever and chills. Patient denies vomiting diarrhea but state there is nausea.Patient rates her pain discomfort as a 7/10. Patient described the pain as "achy".   Past Medical History:  Diagnosis Date  . Asthma   . Endometriosis   . Ovarian cyst     There are no active problems to display for this patient.   Past Surgical History:  Procedure Laterality Date  . LASER ABLATION/CAUTERIZATION OF ENDOMETRIAL IMPLANTS      Prior to Admission medications   Medication Sig Start Date End Date Taking? Authorizing Provider  benzonatate (TESSALON PERLES) 100 MG capsule Take 1 capsule (100 mg total) by mouth 3 (three) times daily as needed for cough. 03/11/17 03/11/18  Joni Reining, PA-C  ketorolac (TORADOL) 10 MG tablet Take 1 tablet (10 mg total) by mouth every 8 (eight) hours as needed. 01/24/15   Darci Current, MD  naproxen (NAPROSYN) 500 MG tablet Take 1 tablet (500 mg total) by mouth 2 (two) times daily with a meal. 03/11/17   Joni Reining, PA-C  sulfamethoxazole-trimethoprim (BACTRIM DS,SEPTRA DS) 800-160 MG tablet Take 1 tablet by mouth 2 (two) times daily. 03/11/17   Joni Reining, PA-C    Allergies Penicillins and Tramadol  History reviewed. No pertinent family history.  Social History Social History  Substance Use Topics  . Smoking status: Current Every Day Smoker    Packs/day: 1.00    Types: Cigarettes  . Smokeless  tobacco: Never Used  . Alcohol use No    Review of Systems Constitutional: No fever/chills Eyes: No visual changes. ENT: No sore throat. Nasal congestion Cardiovascular: Denies chest pain. Respiratory: Denies shortness of breath. Productive cough Gastrointestinal: No abdominal pain. Nausea without vomiting.  No diarrhea.  No constipation. Genitourinary: Negative for dysuria. Musculoskeletal: Chest wall pain. Skin: Negative for rash. Neurological: Negative for headaches, focal weakness or numbness.   ____________________________________________   PHYSICAL EXAM:  VITAL SIGNS: ED Triage Vitals  Enc Vitals Group     BP 03/11/17 1148 133/63     Pulse Rate 03/11/17 1148 87     Resp 03/11/17 1148 16     Temp 03/11/17 1148 98.3 F (36.8 C)     Temp Source 03/11/17 1148 Oral     SpO2 03/11/17 1148 98 %     Weight 03/11/17 1149 125 lb (56.7 kg)     Height 03/11/17 1149  (1.549 m)     Head Circumference --      Peak Flow --      Pain Score 03/11/17 1152 7     Pain Loc --      Pain Edu? --      Excl. in GC? --    Constitutional: Alert and oriented. Well appearing and in no acute distress. Neck: No stridor.   Hematological/Lymphatic/Immunilogical: No cervical lymphadenopathy. Cardiovascular: Normal rate, regular rhythm. Grossly normal heart sounds.  Good peripheral circulation. Respiratory: Normal respiratory effort.  No retractions. Lungs CTAB. Gastrointestinal: Soft and nontender. No distention.  No abdominal bruits. No CVA tenderness. Neurologic:  Normal speech and language. No gross focal neurologic deficits are appreciated. No gait instability. Skin:  Skin is warm, dry and intact. No rash noted. Psychiatric: Mood and affect are normal. Speech and behavior are normal.  ____________________________________________   LABS (all labs ordered are listed, but only abnormal results are displayed)  Labs Reviewed - No data to  display ____________________________________________  EKG   ____________________________________________  RADIOLOGY  Dg Chest 2 View  Result Date: 03/11/2017 CLINICAL DATA:  Cough and congestion . EXAM: CHEST  2 VIEW COMPARISON:  08/07/2014 FINDINGS: Mediastinum hilar structures are normal. Mild peribronchial cuffing noted suggesting bronchitis. No focal alveolar infiltrate. No pleural effusion or pneumothorax. Degenerative changes and scoliosis thoracic spine . IMPRESSION: Mild bilateral peribronchial cuffing suggesting bronchitis. Electronically Signed   By: Maisie Fus  Register   On: 03/11/2017 12:44    _x-ray findings consistent of bronchitis.___________________________________________   PROCEDURES  Procedure(s) performed: None  Procedures  Critical Care performed: No  ____________________________________________   INITIAL IMPRESSION / ASSESSMENT AND PLAN / ED COURSE  Pertinent labs & imaging results that were available during my care of the patient were reviewed by me and considered in my medical decision making (see chart for details).  Patient presents with nasal and chest congestion for one week. X-ray findings of bronchitis. Patient given discharge Instructions and a return to work note for tomorrow. Patient passed take medication as directed. Patient [facility].    ____________________________________________   FINAL CLINICAL IMPRESSION(S) / ED DIAGNOSES  Final diagnoses:  Bronchitis  Nasal congestion      NEW MEDICATIONS STARTED DURING THIS VISIT:  New Prescriptions   BENZONATATE (TESSALON PERLES) 100 MG CAPSULE    Take 1 capsule (100 mg total) by mouth 3 (three) times daily as needed for cough.   NAPROXEN (NAPROSYN) 500 MG TABLET    Take 1 tablet (500 mg total) by mouth 2 (two) times daily with a meal.   SULFAMETHOXAZOLE-TRIMETHOPRIM (BACTRIM DS,SEPTRA DS) 800-160 MG TABLET    Take 1 tablet by mouth 2  (two) times daily.     Note:  This document was prepared using Dragon voice recognition software and may include unintentional dictation errors.    Joni Reining, PA-C 03/11/17 1306    Jene Every, MD 03/11/17 [id number]

## 2017-03-11 NOTE — ED Notes (Signed)
Pt had knife and brass knuckles in possession - and these items collected and at security.

## 2017-07-22 ENCOUNTER — Other Ambulatory Visit: Payer: Self-pay | Admitting: Physician Assistant

## 2017-07-22 DIAGNOSIS — Z3481 Encounter for supervision of other normal pregnancy, first trimester: Secondary | ICD-10-CM

## 2017-07-22 LAB — OB RESULTS CONSOLE HIV ANTIBODY (ROUTINE TESTING): HIV: NONREACTIVE

## 2017-07-22 LAB — OB RESULTS CONSOLE GBS: GBS: POSITIVE

## 2017-07-23 ENCOUNTER — Other Ambulatory Visit: Payer: Self-pay | Admitting: Physician Assistant

## 2017-07-23 DIAGNOSIS — Z3689 Encounter for other specified antenatal screening: Secondary | ICD-10-CM

## 2017-07-23 LAB — OB RESULTS CONSOLE RUBELLA ANTIBODY, IGM: Rubella: IMMUNE

## 2017-07-23 LAB — OB RESULTS CONSOLE HEPATITIS B SURFACE ANTIGEN: Hepatitis B Surface Ag: NEGATIVE

## 2017-07-23 LAB — OB RESULTS CONSOLE VARICELLA ZOSTER ANTIBODY, IGG: VARICELLA IGG: IMMUNE

## 2017-07-23 LAB — OB RESULTS CONSOLE RPR: RPR: NONREACTIVE

## 2017-07-24 LAB — OB RESULTS CONSOLE GBS: STREP GROUP B AG: POSITIVE

## 2017-07-30 ENCOUNTER — Other Ambulatory Visit: Payer: Self-pay | Admitting: Physician Assistant

## 2017-07-31 ENCOUNTER — Observation Stay
Admission: EM | Admit: 2017-07-31 | Discharge: 2017-07-31 | Disposition: A | Discharge status: Home / Self Care | Payer: Medicaid Other | Attending: Obstetrics & Gynecology | Admitting: Obstetrics & Gynecology

## 2017-07-31 DIAGNOSIS — Z3A32 32 weeks gestation of pregnancy: Secondary | ICD-10-CM | POA: Insufficient documentation

## 2017-07-31 DIAGNOSIS — O99323 Drug use complicating pregnancy, third trimester: Secondary | ICD-10-CM | POA: Insufficient documentation

## 2017-07-31 DIAGNOSIS — F1490 Cocaine use, unspecified, uncomplicated: Secondary | ICD-10-CM | POA: Diagnosis not present

## 2017-07-31 DIAGNOSIS — O368130 Decreased fetal movements, third trimester, not applicable or unspecified: Secondary | ICD-10-CM | POA: Diagnosis present

## 2017-07-31 DIAGNOSIS — O368190 Decreased fetal movements, unspecified trimester, not applicable or unspecified: Secondary | ICD-10-CM | POA: Diagnosis present

## 2017-07-31 LAB — URINE DRUG SCREEN, QUALITATIVE (ARMC ONLY)
AMPHETAMINES, UR SCREEN: NOT DETECTED
BARBITURATES, UR SCREEN: NOT DETECTED
Benzodiazepine, Ur Scrn: NOT DETECTED
COCAINE METABOLITE, UR ~~LOC~~: POSITIVE — AB
Cannabinoid 50 Ng, Ur ~~LOC~~: NOT DETECTED
MDMA (Ecstasy)Ur Screen: NOT DETECTED
METHADONE SCREEN, URINE: NOT DETECTED
Opiate, Ur Screen: NOT DETECTED
Phencyclidine (PCP) Ur S: NOT DETECTED
TRICYCLIC, UR SCREEN: NOT DETECTED

## 2017-07-31 LAB — URINALYSIS, ROUTINE W REFLEX MICROSCOPIC
BILIRUBIN URINE: NEGATIVE
Glucose, UA: NEGATIVE mg/dL
Hgb urine dipstick: NEGATIVE
Ketones, ur: NEGATIVE mg/dL
Nitrite: NEGATIVE
Protein, ur: NEGATIVE mg/dL
SPECIFIC GRAVITY, URINE: 1.003 — AB (ref 1.005–1.030)
pH: 7 (ref 5.0–8.0)

## 2017-07-31 MED ORDER — ONDANSETRON HCL 4 MG/2ML IJ SOLN: 4.0000 mg | Freq: Four times a day (QID) | INTRAMUSCULAR | Status: DC | PRN

## 2017-07-31 MED ORDER — ACETAMINOPHEN 325 MG PO TABS: 650.0000 mg | ORAL_TABLET | ORAL | Status: DC | PRN

## 2017-07-31 NOTE — Final Progress Note (Signed)
Physician Final Progress Note  Patient ID: Colleen Pearson MRN: [id number] DOB/AGE: 01/04/1991 27 y.o.  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  Fetal Wellbeing reassuring.  Positive test for Cocaine.  Procedures: A NST procedure was performed with FHR monitoring and a normal baseline established, appropriate time of 20-40 minutes of evaluation, and accels >2 seen w 15x15 characteristics.  Results show a REACTIVE NST.   Discharge Condition: good  Disposition: 01-Home or Self Care  Diet: Regular diet  Discharge Activity: Activity as tolerated  Discharge Instructions    Call MD for:   Complete by:  As directed    Worsening contractions or pain; leakage of fluid; bleeding.   Diet general   Complete by:  As directed    Increase activity slowly   Complete by:  As directed      Allergies as of 07/31/2017      Reactions   Penicillins Hives   Tramadol       Medication List    STOP taking these medications   ketorolac 10 MG tablet Commonly known as:  TORADOL   naproxen 500 MG tablet Commonly known as:  NAPROSYN   sulfamethoxazole-trimethoprim 800-160 MG tablet Commonly known as:  BACTRIM DS,SEPTRA DS     TAKE these medications   benzonatate 100 MG capsule Commonly known as:  TESSALON PERLES Take 1 capsule (100 mg total) by mouth 3 (three) times daily as needed for cough.      Total time spent taking care of this patient: TRIAGE  Signed: Letitia Libraobert Paul Jovontae Banko 07/31/2017, 7:40 PM

## 2017-07-31 NOTE — Discharge Summary (Signed)
  See FPN 

## 2017-07-31 NOTE — OB Triage Note (Signed)
Patient here for decreased fetal movement, lower abdominal pressure and pain in her legs. No LOF some bleeding when she last voided. No burning when she voids sates the last time she felt baby move was yesterday sometime the cramps started this afternoon though the patient cannot tell me what time.

## 2017-07-31 NOTE — Discharge Instructions (Signed)
LABOR: When contractions begin, you should start to time them from the beginning of one contraction to the beginning of the next.  When contractions are 5-10 minutes apart or less and have been regular for at least an hour, you should call your health care provider.  Notify your doctor if any of the following occur: 1. Bleeding from the vagina 7. Sudden, constant, or occasional abdominal pain  2. Pain or burning when urinating 8. Sudden gushing of fluid from the vagina (with or without continued leaking)  3. Chills or fever 9. Fainting spells, "black outs" or loss of consciousness  4. Increase in vaginal discharge 10. Severe or continued nausea or vomiting  5. Pelvic pressure (sudden increase) 11. Blurring of vision or spots before the eyes  6. Baby moving less than usual 12. Leaking of fluid    FETAL KICK COUNT: Lie on your left side for one hour after a meal, and count the number of times your baby kicks. If it is less than 5 times, get up, move around and drink some juice. Repeat the test 30 minutes later. If it is still less than 5 kicks in an hour, notify your doctor.Placental Abruption Placental abruption is a condition in which the placenta partly or completely separates from the uterus before the baby is born. The placenta is the organ that nourishes the unborn baby (fetus). The baby gets his or her blood supply and nutrients through the placenta. It is the babys life support system. The placenta is attached to the inside of the uterus until after the baby is born. Placental abruption is rare, but it can happen any time after 20 weeks of pregnancy. A small separation may not cause problems, but a large separation may be dangerous for you and your baby. A large separation is usually an emergency. It requires treatment right away. What are the causes? In most cases, the cause of this condition is not known. What increases the risk? This condition is more likely to develop in women  who:  Have experienced a recent trauma such as a fall, an abdominal injury, or a car accident.  Have a previous placental abruption.  Have high blood pressure (hypertension).  Smoke cigarettes, use alcohol, or use illegal drugs such as cocaine.  Have blood clotting problems.  Experience preterm premature rupture of membranes (PPROM).  Have multiples (twins, triplets, or more).  Have had children before.  Are 27 years of age or older.  What are the signs or symptoms? Symptoms of this condition can vary from mild to severe. A small placental abruption may not cause symptoms, or it may cause mild symptoms, which may include:  Mild abdominal pain or lower back pain.  Slight vaginal bleeding.  A severe placental abruption will cause symptoms. The symptoms will depend on the size of the separation and the stage of pregnancy. They may include:  Abdominal pain or lower back pain.  Vaginal bleeding.  Tender and hard uterus.  Severe abdominal pain with tenderness.  Continual contractions of your uterus.  Weakness and light-headedness.  How is this diagnosed? This condition may be diagnosed based on:  Your symptoms.  A physical exam.  Ultrasound.  Blood work. This will be done to make sure that there are enough healthy red blood cells and that there are no clotting problems or signs of too much blood loss.  How is this treated? Treatment for placental abruption depends on the severity of the condition. For mild cases, treatment may involve  monitoring your condition and managing your symptoms. This may involve:  Bed rest and close observation.  For more severe cases, emergency treatment is needed. This may involve:  Staying in the hospital until you and your baby are stabilized.  Cesarean delivery of your baby.  A blood transfusion or other fluids given through an IV tube.  Other treatments, depending on: ? The amount of bleeding you have. ? Whether you or  your baby are in distress. ? The stage of your pregnancy. ? The maturity of the baby.  Follow these instructions at home:  Take over-the-counter and prescription medicines only as told by your health care provider. Do not take any medicines that your health care provider has not approved.  Arrange for help at home before and after you deliver your baby, especially if you had a cesarean delivery or if you lost a lot of blood.  Get plenty of rest and sleep.  Do not use illegal drugs.  Do not drink alcohol.  Do not have sexual intercourse until your health care provider says it is okay.  Do not use tampons or douche unless your health care provider says it is okay.  Do not use any products that contain nicotine or tobacco, such as cigarettes and e-cigarettes. If you need help quitting, ask your health care provider. Get help right away if:  You have vaginal bleeding or spotting.  You have any type of trauma, such as a fall, abdominal trauma, or a car accident.  You have abdominal pain.  You have continuous uterine contractions.  You have a hard, tender uterus.  You do not feel the baby move, or the baby moves very little. This information is not intended to replace advice given to you by your health care provider. Make sure you discuss any questions you have with your health care provider. Document Released: 06/08/2005 Document Revised: 02/06/2016 Document Reviewed: 12/29/2015 Elsevier Interactive Patient Education  2018 [employer].  Finding Treatment for Addiction What is addiction? Addiction is a complex disease of the brain. It causes an uncontrollable (compulsive) need for a substance. You can be addicted to alcohol, illegal drugs, or prescription medicines such as painkillers. Addiction can also be a behavior, like gambling or shopping. The need for the drug or activity can become so strong that you think about it all the time. You can also become physically dependent on  a substance. Addiction can change the way your brain works. Because of these changes, getting more of whatever you are addicted to becomes the most important thing to you and feels better than other activities or relationships. Addiction can lead to changes in health, behavior, emotions, relationships, and choices that affect you and everyone around you. How do I know if I need treatment for addiction? Addiction is a progressive disease. Without treatment, addiction can get worse. Living with addiction puts you at higher risk for injury, poor health, lost employment, loss of money, and even death. You might need treatment for addiction if:  You have tried to stop or cut down, but you cannot.  Your addiction is causing physical health problems.  You find it annoying that your friends and family are concerned about your alcohol or substance use.  You feel guilty about substance abuse or a compulsive behavior.  You have lied or tried to hide your addiction.  You need a particular substance or activity to start your day or to calm down.  You are getting in trouble at school, work, home,  or with the police.  You have done something illegal to support your addiction.  You are running out of money because of your addiction.  You have no time for anything other than your addiction.  What types of treatment are available? The treatment program that is right for you will depend on many factors, including the type of addiction you have. Treatment programs can be outpatient or inpatient. In an outpatient program, you live at home and go to work or school, but you also go to a clinic for treatment. With an inpatient program, you live and sleep at the program facility during treatment. After treatment, you might need a plan for support during recovery. Other treatment options include:  Medicine. ? Some addictions may be treated with prescription medicines. ? You might also need medicine to treat  anxiety or depression.  Counseling and behavior therapy. Therapy can help individuals and families behave in healthier ways and relate more effectively.  Support groups. Confidential group therapy, such as a 12-step program, can help individuals and families during treatment and recovery.  No single type of program is right for everyone. Many treatment programs involve a combination of education, counseling, and a 12-step, spiritually-based approach. Some treatment programs are government sponsored. They are geared for patients who do not have private insurance. Treatment programs can vary in many respects, such as:  Cost and types of insurance that are accepted.  Types of on-site medical services that are offered.  Length of stay, setting, and size.  Overall philosophy of treatment.  What should I consider when selecting a treatment program? It is important to think about your individual requirements when selecting a treatment program. There are a number of things to consider, such as:  If the program is certified by the appropriate government agency. Even private programs must be certified and employ certified professionals.  If the program is covered by your insurance. If finances are a concern, the first call you should make is to your insurance company, if you have health insurance. Ask for a list of treatment programs that are in your network, and confirm any copayments and deductibles that you may have to pay. ? If you do not have insurance, or if you choose to attend a program that does not accept your insurance, discuss whether a payment plan can be set up.  If treatment is available in languages other than English, if needed.  If the program offers detoxification treatment, if needed.  If 12-step meetings are held at the center or if transport is available for patients to attend meetings at other locations.  If the program is professional, organized, and clean.  If the  program meets all of your needs, including physical and cultural needs.  If the facility offers specific treatment for your particular addiction.  If support continues to be offered after you have left the program.  If your treatment plan is continually looked at to make sure you are receiving the right treatment at the right time.  If mental health counseling is part of your treatment.  If medicine is included in treatment, if needed.  If your family is included in your treatment plan and if support is offered to them throughout the treatment process.  How the treatment works to prevent relapse.  Where else can I get help?  Your health care provider. Ask him or her to help you find addiction treatment. These discussions are confidential.  The [employer] on Alcoholism and Drug Dependence (NCADD).  This group has information about treatment centers and programs for people who have an addiction and for family members. ? The telephone number is 1-800-NCA-CALL (236-393-7975). ? The website is https://ncadd.org/about-ncadd/our-affiliates  The Substance Abuse and Mental Health Services Administration [facility]). This group will help you find publicly funded treatment centers, help hotlines, and counseling services near you. ? The telephone number is 1-800-662-HELP (250-238-7622). ? The website is www.findtreatment.[employer].pl In countries outside of the [place]. and [place], look in [employer] for contact information for services in your area. This information is not intended to replace advice given to you by your health care provider. Make sure you discuss any questions you have with your health care provider. Document Released: 05/07/2005 Document Revised: 05/04/2016 Document Reviewed: 03/27/2014 Elsevier Interactive Patient Education  2017 [employer].

## 2017-08-02 ENCOUNTER — Ambulatory Visit
Admission: RE | Admit: 2017-08-02 | Discharge: 2017-08-02 | Disposition: A | Discharge status: Home / Self Care | Payer: Medicaid Other | Source: Ambulatory Visit | Attending: Physician Assistant | Admitting: Physician Assistant

## 2017-08-02 ENCOUNTER — Ambulatory Visit: Payer: Self-pay

## 2017-08-02 DIAGNOSIS — Z3481 Encounter for supervision of other normal pregnancy, first trimester: Secondary | ICD-10-CM | POA: Diagnosis present

## 2017-08-02 DIAGNOSIS — Z3A32 32 weeks gestation of pregnancy: Secondary | ICD-10-CM | POA: Diagnosis not present

## 2017-08-02 DIAGNOSIS — Z3483 Encounter for supervision of other normal pregnancy, third trimester: Secondary | ICD-10-CM | POA: Diagnosis not present

## 2017-08-16 ENCOUNTER — Ambulatory Visit (HOSPITAL_BASED_OUTPATIENT_CLINIC_OR_DEPARTMENT_OTHER)
Admission: RE | Admit: 2017-08-16 | Discharge: 2017-08-16 | Disposition: A | Discharge status: Home / Self Care | Payer: Medicaid Other | Source: Ambulatory Visit | Attending: Maternal & Fetal Medicine | Admitting: Maternal & Fetal Medicine

## 2017-08-16 ENCOUNTER — Ambulatory Visit
Admission: RE | Admit: 2017-08-16 | Discharge: 2017-08-16 | Disposition: A | Discharge status: Home / Self Care | Payer: Medicaid Other | Source: Ambulatory Visit | Attending: Physician Assistant | Admitting: Physician Assistant

## 2017-08-16 ENCOUNTER — Ambulatory Visit: Payer: Medicaid Other

## 2017-08-16 VITALS — BP 118/74 | HR 99 | Temp 97.4°F | Resp 18 | Ht 62.5 in | Wt 135.6 lb

## 2017-08-16 DIAGNOSIS — Z8279 Family history of other congenital malformations, deformations and chromosomal abnormalities: Secondary | ICD-10-CM | POA: Insufficient documentation

## 2017-08-16 DIAGNOSIS — Z3689 Encounter for other specified antenatal screening: Secondary | ICD-10-CM

## 2017-08-16 NOTE — Progress Notes (Signed)
Patient seen by me, agree with assessment and plan as outlined in [facility]'s note 

## 2017-08-16 NOTE — Progress Notes (Signed)
Referring Provider:   [facility] Department Length of Consultation: 40 minutes   Colleen Pearson was referred to [facility] for genetic counseling to discuss her family history and prenatal testing options.  This note is a summary of our discussion.  We first obtained a detailed family history. Colleen Pearson reported that her maternal half sister has Down syndrome. This sister is 27 years old and was born with their mother was 39 years old.  There is no other family history of children with birth defects, developmental delays, persons with multiple miscarriages or chromosome conditions.  There is a history of diabetes and hypertension, which we reviewed likely have both genetic as well as lifestyle factors.  She provided no medical or family history information about the father of the pregnancy, other than that he is of African American ancestry.  This is the third pregnancy for Colleen Pearson, the first with her current partner.  She had one very early miscarriage and one elective termination for personal reasons. She denied any complications during this pregnancy or exposures to medications or alcohol.  The patient stated that she is smoking cigarettes, but has cut back from 1 pack per day to less than 1/2 packs per day.  As we discussed, smoking during pregnancy has been associated with low birth weight, premature delivery and pregnancy loss.  For this reason, we suggest that she continue to cut back or avoid smoking for the remainder of the pregnancy.  She also reported smoking marijuana occasionally.  The use of marijuana in pregnancy is known to be associated with low birth weight and premature delivery.  We therefore suggested the patient avoid marijuana as well as other recreational drugs for the remainder of the pregnancy.   Due to the family history, we discussed that chromosomes are the inherited structures that contain our instructions for development  (genes).  Each cell of our body normally has 46 chromosomes, matched up into 23 pairs.  The last pair determines our gender and are called the sex chromosomes.  A female has an X and a Y chromosome, while a female has two X chromosomes.  Rarely, when a mother's egg and father's sperm unite, an extra or missing chromosome can be passed on to the baby by mistake.  Changes in the number or the structure of the chromosomes may result in a child with some degree of mental retardation and physical problems.  Down syndrome is caused by having three copies (instead of the usual two copies) of the genes on chromosome number 21.  There are two types of Down syndrome.  Most often (about 95% of the time), Down syndrome is caused by an entire third copy of chromosome 21, known as Trisomy 79.  This is the type that is most commonly associated with advancing maternal age.  In the other cases, Down syndrome is caused by a rearrangement of the chromosomes, known as a translocation.    Because we do not have documentation of the type of Down syndrome, we discussed the recurrence of both types.  If her sister has the more common type, it is thought to be a sporadic event that would not be likely to happen again in the family and should not increase the chance for Colleen Pearson to have a child with Down syndrome.  If her sister has the translocation type of Down syndrome, the recurrence risk may be higher depending upon which, if any, family members may be translocation carriers. She  planned to speak with her mother and try to get more information.  We offered to review medical records on chromosome testing for her sister if they are available.  Given the late gestational age, we discussed the following prenatal screening and testing options for this pregnancy:  Targeted ultrasound uses high frequency sound waves to create an image of the developing fetus.  An ultrasound is often recommended as a routine means of evaluating the  pregnancy.  It is also used to screen for fetal anatomy problems (for example, a heart defect) that might be suggestive of a chromosomal or other abnormality.   Amniocentesis involves the removal of a small amount of amniotic fluid from the sac surrounding the fetus ("bag of water") with the use of a thin needle inserted through the mother's abdomen and uterus.  Ultrasound guidance is used throughout the procedure.  Fetal cells from amniotic fluid are directly evaluated and > 99.5% of chromosome problems and > 98% of open neural tube defects can be detected. This procedure is generally performed after the 15th week of pregnancy.  The main risks to this procedure include complications leading to miscarriage in less than 1 in 200 cases (0.5%).   We also reviewed the availability of cell free fetal DNA testing from maternal blood to determine whether or not the baby may have either Down syndrome, trisomy 38, or trisomy 8.  This test utilizes a maternal blood sample and DNA sequencing technology to isolate circulating cell free fetal DNA from maternal plasma.  The fetal DNA can then be analyzed for DNA sequences that are derived from the three most common chromosomes involved in aneuploidy, chromosomes 13, 18, and 21.  If the overall amount of DNA is greater than the expected level for any of these chromosomes, aneuploidy is suspected.  While we do not consider it a replacement for invasive testing and karyotype analysis, a negative result from this testing would be reassuring, though not a guarantee of a normal chromosome complement for the baby.  An abnormal result is certainly suggestive of an abnormal chromosome complement, though we would still recommend CVS or amniocentesis to confirm any findings from this testing.  Cystic Fibrosis and Spinal Muscular Atrophy (SMA) screening were also discussed with the patient. Both conditions are recessive, which means that both parents must be carriers in order to have  a child with the disease.  Cystic fibrosis (CF) is one of the most common genetic conditions in persons of Caucasian ancestry.  This condition occurs in approximately 1 in 2,500 Caucasian persons and results in thickened secretions in the lungs, digestive, and reproductive systems.  For a baby to be at risk for having CF, both of the parents must be carriers for this condition.  Approximately 1 in 66 Caucasian persons is a carrier for CF.  Current carrier testing looks for the most common mutations in the gene for CF and can detect approximately 90% of carriers in the Caucasian population.  This means that the carrier screening can greatly reduce, but cannot eliminate, the chance for an individual to have a child with CF.  If an individual is found to be a carrier for CF, then carrier testing would be available for the partner. As part of [place] [facility]'s newborn screening profile, all babies born in the state of [place] will have a two-tier screening process.  Specimens are first tested to determine the concentration of immunoreactive trypsinogen (IRT).  The top 5% of specimens with the highest IRT values  then undergo DNA testing using a panel of over 40 common CF mutations. SMA is a neurodegenerative disorder that leads to atrophy of skeletal muscle and overall weakness.  This condition is also more prevalent in the Caucasian population, with 1 in 40-1 in 60 persons being a carrier and 1 in 6,000-1 in 10,000 children being affected.  There are multiple forms of the disease, with some causing death in infancy to other forms with survival into adulthood.  The genetics of SMA is complex, but carrier screening can detect up to 95% of carriers in the Caucasian population.  Similar to CF, a negative result can greatly reduce, but cannot eliminate, the chance to have a child with SMA.  We also offered the option of hemoglobinopathy screening because the father of the baby is reported to be African American.  We  discussed the prenatal options in detail and after consideration, the patient elected to have an ultrasound only and to decline all other screening and testing options.  An ultrasound was performed at the time of this visit.  See that report for details.  Though no findings were seen to suggest an increased risk for birth defects or a chromosome condition in the pregnancy, we cannot detect or rule out all birth differences or chromosome conditions.  We appreciate very much the opportunity to be involved with the care of this patient.  If the patient has further questions or concerns, please do not hesitate to call us at [phone].  Cherly Andersoneborah F. Jazlyn Tippens, MS, CGC

## 2017-08-29 ENCOUNTER — Encounter: Payer: Self-pay | Admitting: *Deleted

## 2017-08-29 ENCOUNTER — Other Ambulatory Visit: Payer: Self-pay

## 2017-08-29 ENCOUNTER — Inpatient Hospital Stay
Admission: EM | Admit: 2017-08-29 | Discharge: 2017-09-02 | DRG: 786 | Disposition: A | Discharge status: Home / Self Care | Payer: Medicaid Other | Attending: Maternal Newborn | Admitting: Maternal Newborn

## 2017-08-29 DIAGNOSIS — O365990 Maternal care for other known or suspected poor fetal growth, unspecified trimester, not applicable or unspecified: Secondary | ICD-10-CM

## 2017-08-29 DIAGNOSIS — Z880 Allergy status to penicillin: Secondary | ICD-10-CM

## 2017-08-29 DIAGNOSIS — F1490 Cocaine use, unspecified, uncomplicated: Secondary | ICD-10-CM | POA: Diagnosis present

## 2017-08-29 DIAGNOSIS — O99824 Streptococcus B carrier state complicating childbirth: Secondary | ICD-10-CM | POA: Diagnosis present

## 2017-08-29 DIAGNOSIS — D62 Acute posthemorrhagic anemia: Secondary | ICD-10-CM | POA: Diagnosis not present

## 2017-08-29 DIAGNOSIS — O99344 Other mental disorders complicating childbirth: Secondary | ICD-10-CM | POA: Diagnosis present

## 2017-08-29 DIAGNOSIS — F1410 Cocaine abuse, uncomplicated: Secondary | ICD-10-CM | POA: Diagnosis present

## 2017-08-29 DIAGNOSIS — O9081 Anemia of the puerperium: Secondary | ICD-10-CM | POA: Diagnosis not present

## 2017-08-29 DIAGNOSIS — Z98891 History of uterine scar from previous surgery: Secondary | ICD-10-CM

## 2017-08-29 DIAGNOSIS — O99334 Smoking (tobacco) complicating childbirth: Secondary | ICD-10-CM | POA: Diagnosis present

## 2017-08-29 DIAGNOSIS — O4103X0 Oligohydramnios, third trimester, not applicable or unspecified: Secondary | ICD-10-CM | POA: Diagnosis present

## 2017-08-29 DIAGNOSIS — F319 Bipolar disorder, unspecified: Secondary | ICD-10-CM | POA: Diagnosis present

## 2017-08-29 DIAGNOSIS — F17210 Nicotine dependence, cigarettes, uncomplicated: Secondary | ICD-10-CM | POA: Diagnosis present

## 2017-08-29 DIAGNOSIS — O99323 Drug use complicating pregnancy, third trimester: Secondary | ICD-10-CM

## 2017-08-29 DIAGNOSIS — Z3A36 36 weeks gestation of pregnancy: Secondary | ICD-10-CM

## 2017-08-29 DIAGNOSIS — O99324 Drug use complicating childbirth: Secondary | ICD-10-CM | POA: Diagnosis present

## 2017-08-29 LAB — URINE DRUG SCREEN, QUALITATIVE (ARMC ONLY)
Amphetamines, Ur Screen: NOT DETECTED
BARBITURATES, UR SCREEN: NOT DETECTED
BENZODIAZEPINE, UR SCRN: NOT DETECTED
Cannabinoid 50 Ng, Ur ~~LOC~~: NOT DETECTED
Cocaine Metabolite,Ur ~~LOC~~: POSITIVE — AB
MDMA (Ecstasy)Ur Screen: NOT DETECTED
METHADONE SCREEN, URINE: NOT DETECTED
OPIATE, UR SCREEN: NOT DETECTED
PHENCYCLIDINE (PCP) UR S: NOT DETECTED
Tricyclic, Ur Screen: NOT DETECTED

## 2017-08-29 LAB — CBC
HEMATOCRIT: 39 % (ref 35.0–47.0)
Hemoglobin: 13.3 g/dL (ref 12.0–16.0)
MCH: 29.2 pg (ref 26.0–34.0)
MCHC: 34.1 g/dL (ref 32.0–36.0)
MCV: 85.5 fL (ref 80.0–100.0)
PLATELETS: 253 10*3/uL (ref 150–440)
RBC: 4.56 MIL/uL (ref 3.80–5.20)
RDW: 15.6 % — AB (ref 11.5–14.5)
WBC: 9.9 10*3/uL (ref 3.6–11.0)

## 2017-08-29 LAB — OB RESULTS CONSOLE GC/CHLAMYDIA
Chlamydia: NEGATIVE
GC PROBE AMP, GENITAL: NEGATIVE

## 2017-08-29 LAB — TYPE AND SCREEN
ABO/RH(D): O POS
Antibody Screen: NEGATIVE

## 2017-08-29 LAB — CHLAMYDIA/NGC RT PCR (ARMC ONLY)
Chlamydia Tr: NOT DETECTED
N GONORRHOEAE: NOT DETECTED

## 2017-08-29 MED ORDER — CEFAZOLIN SODIUM-DEXTROSE 1-4 GM/50ML-% IV SOLN
1.0000 g | Freq: Three times a day (TID) | INTRAVENOUS | Status: DC
  Administered 2017-08-29 – 2017-08-30 (×3): 1 g via INTRAVENOUS
  Filled 2017-08-29 (×3): qty 50

## 2017-08-29 MED ORDER — ONDANSETRON HCL 4 MG/2ML IJ SOLN
4.0000 mg | Freq: Four times a day (QID) | INTRAMUSCULAR | Status: DC | PRN
  Administered 2017-08-30: 4 mg via INTRAVENOUS

## 2017-08-29 MED ORDER — MISOPROSTOL 200 MCG PO TABS
ORAL_TABLET | ORAL | Status: AC
  Filled 2017-08-29: qty 4

## 2017-08-29 MED ORDER — OXYTOCIN BOLUS FROM INFUSION: 500.0000 mL | Freq: Once | INTRAVENOUS | Status: DC

## 2017-08-29 MED ORDER — LACTATED RINGERS IV SOLN: 500.0000 mL | INTRAVENOUS | Status: DC | PRN

## 2017-08-29 MED ORDER — OXYTOCIN 40 UNITS IN LACTATED RINGERS INFUSION - SIMPLE MED
2.5000 [IU]/h | INTRAVENOUS | Status: DC
  Administered 2017-08-30: 1 mL via INTRAVENOUS
  Administered 2017-08-30: 399 mL via INTRAVENOUS
  Administered 2017-08-30: 2.5 [IU]/h via INTRAVENOUS
  Filled 2017-08-29: qty 1000

## 2017-08-29 MED ORDER — ACETAMINOPHEN 325 MG PO TABS
650.0000 mg | ORAL_TABLET | ORAL | Status: DC | PRN
  Administered 2017-08-29: 650 mg via ORAL
  Filled 2017-08-29: qty 2

## 2017-08-29 MED ORDER — FENTANYL CITRATE (PF) 100 MCG/2ML IJ SOLN
50.0000 ug | INTRAMUSCULAR | Status: DC | PRN
  Administered 2017-08-29: 50 ug via INTRAVENOUS
  Filled 2017-08-29: qty 2

## 2017-08-29 MED ORDER — BETAMETHASONE SOD PHOS & ACET 6 (3-3) MG/ML IJ SUSP
12.0000 mg | Freq: Once | INTRAMUSCULAR | Status: AC
  Administered 2017-08-29: 12 mg via INTRAMUSCULAR

## 2017-08-29 MED ORDER — OXYTOCIN 40 UNITS IN LACTATED RINGERS INFUSION - SIMPLE MED
INTRAVENOUS | Status: AC
  Filled 2017-08-29: qty 1000

## 2017-08-29 MED ORDER — CEFAZOLIN SODIUM-DEXTROSE 2-4 GM/100ML-% IV SOLN
2.0000 g | Freq: Once | INTRAVENOUS | Status: AC
  Administered 2017-08-29: 2 g via INTRAVENOUS
  Filled 2017-08-29: qty 100

## 2017-08-29 MED ORDER — LACTATED RINGERS IV SOLN
INTRAVENOUS | Status: DC
  Administered 2017-08-29 – 2017-08-30 (×5): via INTRAVENOUS

## 2017-08-29 MED ORDER — BETAMETHASONE SOD PHOS & ACET 6 (3-3) MG/ML IJ SUSP
12.0000 mg | Freq: Once | INTRAMUSCULAR | Status: AC
  Administered 2017-08-30: 12 mg via INTRAMUSCULAR

## 2017-08-29 MED ORDER — ZOLPIDEM TARTRATE 5 MG PO TABS
5.0000 mg | ORAL_TABLET | Freq: Every evening | ORAL | Status: AC | PRN
  Administered 2017-08-29: 5 mg via ORAL
  Filled 2017-08-29: qty 1

## 2017-08-29 MED ORDER — BETAMETHASONE SOD PHOS & ACET 6 (3-3) MG/ML IJ SUSP
INTRAMUSCULAR | Status: AC
  Filled 2017-08-29: qty 1

## 2017-08-29 MED ORDER — OXYTOCIN 10 UNIT/ML IJ SOLN
INTRAMUSCULAR | Status: AC
  Filled 2017-08-29: qty 2

## 2017-08-29 MED ORDER — LIDOCAINE HCL (PF) 1 % IJ SOLN
INTRAMUSCULAR | Status: AC
  Filled 2017-08-29: qty 30

## 2017-08-29 MED ORDER — LIDOCAINE HCL (PF) 1 % IJ SOLN
30.0000 mL | INTRAMUSCULAR | Status: DC | PRN
  Filled 2017-08-29: qty 30

## 2017-08-29 MED ORDER — AMMONIA AROMATIC IN INHA
RESPIRATORY_TRACT | Status: AC
  Filled 2017-08-29: qty 10

## 2017-08-29 NOTE — Progress Notes (Signed)
  Labor Progress Note   27 y.o. G3P0010 @ 3184w1d , admitted for  Pregnancy, Labor Management. Admitted for contractions  Subjective:  Patient feels contractions every few minutes. She is coping well through them. Discussion of no change with cervical exam and recommendations for care given history. Recommend patient stay in-patient overnight and go to MFM in the morning for growth scan and receive second dose of betamethasone.  Objective:  BP 119/75 (BP Location: Right Arm)   Pulse 65   Temp 98.1 F (36.7 C) (Oral)   Resp 18   Ht 5' (1.524 m)   Wt 130 lb (59 kg)   LMP  (LMP Unknown)   BMI 25.39 kg/m  Abd: mild Extr: no edema SVE: CERVIX: 3 cm dilated, 100 effaced, 0 station per RN at 1556. No change from earlier exam.  EFM: FHR: 135 bpm, variability: moderate,  accelerations:  Present,  decelerations:  Absent Toco: Frequency: Every 2-8 minutes Labs: I have reviewed the patient's lab results.   Assessment & Plan:  G3P0010 @ 2884w1d, admitted for  Pregnancy and Labor/Delivery Management  1. Pain management: none. 2. FWB: FHT category I.  3. ID: GBS positive prophylaxis with Ancef 4. Labor management: No augmentation at this time. Awaiting MFM consult for growth and recommendations for delivery.  All discussed with patient, see orders  Tresea MallJane Leila Schuff, CNM [facility], [facility] 08/29/2017  6:13 PM

## 2017-08-29 NOTE — Plan of Care (Signed)
Discussed plan of care with patient. Patient verbalized understanding.

## 2017-08-29 NOTE — H&P (Addendum)
OB History & Physical   History of Present Illness:  Chief Complaint: contractions  HPI:  Colleen Pearson is a 27 y.o. G3P0010 female at [redacted]w[redacted]d dated by 32 week ultrasound.  Her pregnancy has been complicated by late entry to care, bipolar disorder, polysubstance abuse, GBS bacteriuria, trichomoniasis, tobacco abuse, history of asthma, FOB unsure.    She reports contractions.   She denies leakage of fluid.   She denies vaginal bleeding.   She reports fetal movement.    Maternal Medical History:   Past Medical History:  Diagnosis Date  . Asthma   . Endometriosis   . Ovarian cyst     Past Surgical History:  Procedure Laterality Date  . LASER ABLATION/CAUTERIZATION OF ENDOMETRIAL IMPLANTS      Allergies  Allergen Reactions  . Penicillins Hives  . Tramadol Hives    Prior to Admission medications   Medication Sig Start Date End Date Taking? Authorizing Provider  Prenatal Vit-Fe Fumarate-FA (PRENATAL MULTIVITAMIN) TABS tablet Take 1 tablet by mouth daily at 12 noon.   Yes [provider]    OB History  Gravida Para Term Preterm AB Living  3 0 0 0 1 0  SAB TAB Ectopic Multiple Live Births  1            # Outcome Date GA Lbr Len/2nd Weight Sex Delivery Anes PTL Lv  3 Current           2 Gravida           1 SAB               Prenatal care site: [facility]  Social History: She  reports that she has been smoking cigarettes.  She has been smoking about 0.25 packs per day. she has never used smokeless tobacco. She reports that she uses drugs. Drugs: Marijuana and Cocaine. She reports that she does not drink alcohol.  Family History: Down Syndrome in sister    Review of Systems: Negative x 10 systems reviewed except as noted in the HPI.    Physical Exam:  Vital Signs: BP 119/75 (BP Location: Right Arm)   Pulse 65   Temp 98.1 F (36.7 C) (Oral)   Resp 18   Ht 5' (1.524 m)   Wt 130 lb (59 kg)   LMP  (LMP Unknown)   BMI 25.39 kg/m  Constitutional: Well  nourished, well developed female in no acute distress.  HEENT: normal Skin: Warm and dry.  Cardiovascular: Regular rate and rhythm.   Extremity: no edema  Respiratory: Clear to auscultation bilateral. Normal respiratory effort Abdomen: FHT present Back: no CVAT Neuro: DTRs 2+, Cranial nerves grossly intact Psych: Alert and Oriented x3. No memory deficits. Normal mood and affect.  MS: normal gait, normal bilateral lower extremity ROM/strength/stability.  Pelvic exam:  is not limited by body habitus EGBUS: within normal limits Vagina: within normal limits and with normal mucosa  Cervix: 3/100/0   Pertinent Results:  Prenatal Labs: Blood type/Rh O positive  Antibody screen negative  Rubella MMRx2  Varicella Immune    RPR Non-reactive  HBsAg negative  HIV negative  GC negative  Chlamydia negative  Genetic screening Too late for screening  1 hour GTT Not done  3 hour GTT NA  GBS positive on 1/31 in urine   Baseline FHR: 135 beats/min   Variability: moderate   Accelerations: present   Decelerations: absent Contractions: present frequency: every 2-4 minutes Overall assessment: Category I    Assessment:  Colleen Mings  Lenox PondsJ Pearson is a 27 y.o. 383P0010 female at 3879w1d with active labor.   Plan:  1. Admit to Labor & Delivery  2. CBC, T&S, Clrs, IVF 3. GBS positive. Cefazolin prophylaxis given hives reaction to penicillin   4. Betamethasone for fetal lung maturity- give 1st dose and if still pregnant in 24 hours give 2nd dose 5. Fetal well-being: Category I 6. Expectant management for vaginal delivery, augment as needed  [name], CNM 08/29/2017 12:20 PM
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
## 2017-08-29 NOTE — Progress Notes (Signed)
Patient back to bed from ambulation. Reports having "one contractions" while ambulating. Resting in bed comfortably at this time. Requesting chocolate ice cream.

## 2017-08-30 ENCOUNTER — Inpatient Hospital Stay: Payer: Medicaid Other | Admitting: Anesthesiology

## 2017-08-30 ENCOUNTER — Encounter
Admission: EM | Disposition: A | Discharge status: Home / Self Care | Payer: Self-pay | Source: Home / Self Care | Attending: Maternal Newborn

## 2017-08-30 ENCOUNTER — Inpatient Hospital Stay: Payer: Medicaid Other

## 2017-08-30 DIAGNOSIS — O99323 Drug use complicating pregnancy, third trimester: Secondary | ICD-10-CM

## 2017-08-30 DIAGNOSIS — Z98891 History of uterine scar from previous surgery: Secondary | ICD-10-CM

## 2017-08-30 DIAGNOSIS — F1490 Cocaine use, unspecified, uncomplicated: Secondary | ICD-10-CM | POA: Diagnosis present

## 2017-08-30 DIAGNOSIS — O4103X0 Oligohydramnios, third trimester, not applicable or unspecified: Secondary | ICD-10-CM

## 2017-08-30 DIAGNOSIS — Z3A36 36 weeks gestation of pregnancy: Secondary | ICD-10-CM

## 2017-08-30 HISTORY — DX: Cocaine use, unspecified, uncomplicated: F14.90

## 2017-08-30 HISTORY — DX: History of uterine scar from previous surgery: Z98.891

## 2017-08-30 HISTORY — DX: Drug use complicating pregnancy, third trimester: O99.323

## 2017-08-30 LAB — URINALYSIS, COMPLETE (UACMP) WITH MICROSCOPIC
Bilirubin Urine: NEGATIVE
Glucose, UA: 50 mg/dL — AB
Ketones, ur: NEGATIVE mg/dL
NITRITE: NEGATIVE
PH: 7 (ref 5.0–8.0)
Protein, ur: NEGATIVE mg/dL
SPECIFIC GRAVITY, URINE: 1.008 (ref 1.005–1.030)

## 2017-08-30 LAB — ROM PLUS (ARMC ONLY): Rom Plus: NEGATIVE

## 2017-08-30 SURGERY — Surgical Case
Anesthesia: Epidural

## 2017-08-30 MED ORDER — GENTAMICIN SULFATE 40 MG/ML IJ SOLN
5.0000 mg/kg | INTRAVENOUS | Status: DC
  Filled 2017-08-30: qty 7.5

## 2017-08-30 MED ORDER — NALBUPHINE HCL 10 MG/ML IJ SOLN: 5.0000 mg | Freq: Once | INTRAMUSCULAR | Status: DC | PRN

## 2017-08-30 MED ORDER — LABETALOL HCL 5 MG/ML IV SOLN
80.0000 mg | Freq: Once | INTRAVENOUS | Status: DC | PRN
  Filled 2017-08-30: qty 16

## 2017-08-30 MED ORDER — DIPHENHYDRAMINE HCL 25 MG PO CAPS: 25.0000 mg | ORAL_CAPSULE | Freq: Four times a day (QID) | ORAL | Status: DC | PRN

## 2017-08-30 MED ORDER — DIPHENHYDRAMINE HCL 25 MG PO CAPS: 25.0000 mg | ORAL_CAPSULE | ORAL | Status: DC | PRN

## 2017-08-30 MED ORDER — LABETALOL HCL 5 MG/ML IV SOLN
40.0000 mg | Freq: Once | INTRAVENOUS | Status: DC | PRN
  Filled 2017-08-30: qty 8

## 2017-08-30 MED ORDER — FENTANYL CITRATE (PF) 100 MCG/2ML IJ SOLN
INTRAMUSCULAR | Status: DC | PRN
  Administered 2017-08-30: 100 ug via EPIDURAL

## 2017-08-30 MED ORDER — BUPIVACAINE 0.25 % ON-Q PUMP DUAL CATH 400 ML
400.0000 mL | INJECTION | Status: DC
  Filled 2017-08-30: qty 400

## 2017-08-30 MED ORDER — OXYCODONE HCL 5 MG PO TABS
5.0000 mg | ORAL_TABLET | ORAL | Status: DC | PRN
  Administered 2017-08-31 (×2): 5 mg via ORAL
  Filled 2017-08-30 (×2): qty 1

## 2017-08-30 MED ORDER — DIBUCAINE 1 % RE OINT: 1.0000 "application " | TOPICAL_OINTMENT | RECTAL | Status: DC | PRN

## 2017-08-30 MED ORDER — MORPHINE SULFATE (PF) 0.5 MG/ML IJ SOLN
INTRAMUSCULAR | Status: AC
  Filled 2017-08-30: qty 10

## 2017-08-30 MED ORDER — CEFAZOLIN SODIUM-DEXTROSE 2-4 GM/100ML-% IV SOLN
2.0000 g | INTRAVENOUS | Status: AC
  Administered 2017-08-30: 2 g via INTRAVENOUS
  Filled 2017-08-30: qty 100

## 2017-08-30 MED ORDER — MIDAZOLAM HCL 2 MG/2ML IJ SOLN
INTRAMUSCULAR | Status: AC
  Filled 2017-08-30: qty 2

## 2017-08-30 MED ORDER — SODIUM CHLORIDE 0.9% FLUSH: 3.0000 mL | INTRAVENOUS | Status: DC | PRN

## 2017-08-30 MED ORDER — MIDAZOLAM HCL 2 MG/2ML IJ SOLN
INTRAMUSCULAR | Status: DC | PRN
  Administered 2017-08-30 (×2): 1 mg via INTRAVENOUS

## 2017-08-30 MED ORDER — FERROUS SULFATE 325 (65 FE) MG PO TABS
325.0000 mg | ORAL_TABLET | Freq: Two times a day (BID) | ORAL | Status: DC
  Administered 2017-08-31 – 2017-09-01 (×3): 325 mg via ORAL
  Filled 2017-08-30 (×3): qty 1

## 2017-08-30 MED ORDER — KETOROLAC TROMETHAMINE 30 MG/ML IJ SOLN
30.0000 mg | Freq: Four times a day (QID) | INTRAMUSCULAR | Status: AC
  Administered 2017-08-30 – 2017-08-31 (×4): 30 mg via INTRAVENOUS
  Filled 2017-08-30 (×3): qty 1

## 2017-08-30 MED ORDER — OXYCODONE-ACETAMINOPHEN 5-325 MG PO TABS
2.0000 | ORAL_TABLET | ORAL | Status: DC | PRN
  Administered 2017-09-01 – 2017-09-02 (×8): 2 via ORAL
  Filled 2017-08-30 (×8): qty 2

## 2017-08-30 MED ORDER — OXYCODONE-ACETAMINOPHEN 5-325 MG PO TABS
1.0000 | ORAL_TABLET | ORAL | Status: DC | PRN
  Administered 2017-08-31 – 2017-09-01 (×2): 1 via ORAL
  Filled 2017-08-30 (×2): qty 1

## 2017-08-30 MED ORDER — ACETAMINOPHEN 325 MG PO TABS
650.0000 mg | ORAL_TABLET | Freq: Four times a day (QID) | ORAL | Status: AC
  Administered 2017-08-30 – 2017-08-31 (×3): 650 mg via ORAL
  Filled 2017-08-30 (×3): qty 2

## 2017-08-30 MED ORDER — PRENATAL MULTIVITAMIN CH
1.0000 | ORAL_TABLET | Freq: Every day | ORAL | Status: DC
  Administered 2017-08-31 – 2017-09-02 (×3): 1 via ORAL
  Filled 2017-08-30 (×3): qty 1

## 2017-08-30 MED ORDER — SOD CITRATE-CITRIC ACID 500-334 MG/5ML PO SOLN
ORAL | Status: AC
  Filled 2017-08-30: qty 15

## 2017-08-30 MED ORDER — LORAZEPAM 2 MG/ML IJ SOLN: 0.5000 mg | Freq: Once | INTRAMUSCULAR | Status: DC

## 2017-08-30 MED ORDER — OXYTOCIN 40 UNITS IN LACTATED RINGERS INFUSION - SIMPLE MED
2.5000 [IU]/h | INTRAVENOUS | Status: AC
  Filled 2017-08-30: qty 1000

## 2017-08-30 MED ORDER — BUPIVACAINE HCL (PF) 0.5 % IJ SOLN
5.0000 mL | Freq: Once | INTRAMUSCULAR | Status: DC
  Filled 2017-08-30: qty 30

## 2017-08-30 MED ORDER — SIMETHICONE 80 MG PO CHEW
80.0000 mg | CHEWABLE_TABLET | Freq: Three times a day (TID) | ORAL | Status: DC
  Administered 2017-08-31 – 2017-09-02 (×5): 80 mg via ORAL
  Filled 2017-08-30 (×9): qty 1

## 2017-08-30 MED ORDER — LACTATED RINGERS IV SOLN
INTRAVENOUS | Status: DC
  Administered 2017-08-31: 08:00:00 via INTRAVENOUS

## 2017-08-30 MED ORDER — MORPHINE SULFATE-NACL 0.5-0.9 MG/ML-% IV SOSY
PREFILLED_SYRINGE | INTRAVENOUS | Status: DC | PRN
  Administered 2017-08-30: 3 mg via EPIDURAL

## 2017-08-30 MED ORDER — WITCH HAZEL-GLYCERIN EX PADS: 1.0000 "application " | MEDICATED_PAD | CUTANEOUS | Status: DC | PRN

## 2017-08-30 MED ORDER — NALBUPHINE HCL 10 MG/ML IJ SOLN: 5.0000 mg | INTRAMUSCULAR | Status: DC | PRN

## 2017-08-30 MED ORDER — BUPIVACAINE HCL 0.5 % IJ SOLN
INTRAMUSCULAR | Status: DC | PRN
  Administered 2017-08-30: 10 mL

## 2017-08-30 MED ORDER — COCONUT OIL OIL: 1.0000 "application " | TOPICAL_OIL | Status: DC | PRN

## 2017-08-30 MED ORDER — ONDANSETRON HCL 4 MG/2ML IJ SOLN
4.0000 mg | Freq: Three times a day (TID) | INTRAMUSCULAR | Status: DC | PRN
  Administered 2017-08-30: 4 mg via INTRAVENOUS
  Filled 2017-08-30: qty 2

## 2017-08-30 MED ORDER — FENTANYL CITRATE (PF) 100 MCG/2ML IJ SOLN
INTRAMUSCULAR | Status: AC
  Filled 2017-08-30: qty 2

## 2017-08-30 MED ORDER — PROPOFOL 10 MG/ML IV BOLUS
INTRAVENOUS | Status: AC
  Filled 2017-08-30: qty 20

## 2017-08-30 MED ORDER — LIDOCAINE-EPINEPHRINE (PF) 2 %-1:200000 IJ SOLN
INTRAMUSCULAR | Status: DC | PRN
  Administered 2017-08-30: 3 mL via EPIDURAL
  Administered 2017-08-30: 2 mL via EPIDURAL
  Administered 2017-08-30 (×3): 5 mL via EPIDURAL

## 2017-08-30 MED ORDER — SOD CITRATE-CITRIC ACID 500-334 MG/5ML PO SOLN: 30.0000 mL | ORAL | Status: DC

## 2017-08-30 MED ORDER — MEPERIDINE HCL 25 MG/ML IJ SOLN: 6.2500 mg | INTRAMUSCULAR | Status: DC | PRN

## 2017-08-30 MED ORDER — SENNOSIDES-DOCUSATE SODIUM 8.6-50 MG PO TABS
2.0000 | ORAL_TABLET | ORAL | Status: DC
  Administered 2017-08-31 – 2017-09-02 (×3): 2 via ORAL
  Filled 2017-08-30 (×3): qty 2

## 2017-08-30 MED ORDER — BUPIVACAINE HCL (PF) 0.5 % IJ SOLN: 5.0000 mL | Freq: Once | INTRAMUSCULAR | Status: DC

## 2017-08-30 MED ORDER — CLINDAMYCIN PHOSPHATE 900 MG/50ML IV SOLN: 900.0000 mg | INTRAVENOUS | Status: DC

## 2017-08-30 MED ORDER — LABETALOL HCL 5 MG/ML IV SOLN
20.0000 mg | Freq: Once | INTRAVENOUS | Status: DC | PRN
  Filled 2017-08-30: qty 4

## 2017-08-30 MED ORDER — NALOXONE HCL 0.4 MG/ML IJ SOLN: 0.4000 mg | INTRAMUSCULAR | Status: DC | PRN

## 2017-08-30 MED ORDER — MENTHOL 3 MG MT LOZG
1.0000 | LOZENGE | OROMUCOSAL | Status: DC | PRN
  Filled 2017-08-30: qty 9

## 2017-08-30 MED ORDER — IBUPROFEN 600 MG PO TABS
600.0000 mg | ORAL_TABLET | Freq: Four times a day (QID) | ORAL | Status: DC
  Administered 2017-08-31 – 2017-09-02 (×7): 600 mg via ORAL
  Filled 2017-08-30 (×7): qty 1

## 2017-08-30 MED ORDER — DIPHENHYDRAMINE HCL 50 MG/ML IJ SOLN: 12.5000 mg | INTRAMUSCULAR | Status: DC | PRN

## 2017-08-30 MED ORDER — BETAMETHASONE SOD PHOS & ACET 6 (3-3) MG/ML IJ SUSP
INTRAMUSCULAR | Status: AC
  Filled 2017-08-30: qty 1

## 2017-08-30 MED ORDER — KETOROLAC TROMETHAMINE 30 MG/ML IJ SOLN
30.0000 mg | Freq: Four times a day (QID) | INTRAMUSCULAR | Status: AC
  Filled 2017-08-30: qty 1

## 2017-08-30 SURGICAL SUPPLY — 32 items
CANISTER SUCT 3000ML PPV (MISCELLANEOUS) ×3 IMPLANT
CATH KIT ON-Q SILVERSOAK 5IN (CATHETERS) ×6 IMPLANT
CLOSURE WOUND 1/2 X4 (GAUZE/BANDAGES/DRESSINGS)
DERMABOND ADVANCED (GAUZE/BANDAGES/DRESSINGS) ×2
DERMABOND ADVANCED .7 DNX12 (GAUZE/BANDAGES/DRESSINGS) ×1 IMPLANT
DRSG OPSITE POSTOP 4X10 (GAUZE/BANDAGES/DRESSINGS) ×3 IMPLANT
DRSG TELFA 3X8 NADH (GAUZE/BANDAGES/DRESSINGS) IMPLANT
ELECT CAUTERY BLADE 6.4 (BLADE) ×3 IMPLANT
ELECT REM PT RETURN 9FT ADLT (ELECTROSURGICAL) ×3
ELECTRODE REM PT RTRN 9FT ADLT (ELECTROSURGICAL) ×1 IMPLANT
GAUZE SPONGE 4X4 12PLY STRL (GAUZE/BANDAGES/DRESSINGS) IMPLANT
GLOVE BIO SURGEON STRL SZ7 (GLOVE) ×9 IMPLANT
GLOVE INDICATOR 7.5 STRL GRN (GLOVE) ×9 IMPLANT
GOWN STRL REUS W/ TWL LRG LVL3 (GOWN DISPOSABLE) ×3 IMPLANT
GOWN STRL REUS W/TWL LRG LVL3 (GOWN DISPOSABLE) ×6
NS IRRIG 1000ML POUR BTL (IV SOLUTION) ×3 IMPLANT
PACK C SECTION AR (MISCELLANEOUS) ×3 IMPLANT
PAD OB MATERNITY 4.3X12.25 (PERSONAL CARE ITEMS) ×3 IMPLANT
PAD PREP 24X41 OB/GYN DISP (PERSONAL CARE ITEMS) ×3 IMPLANT
SPONGE LAP 18X18 5 PK (GAUZE/BANDAGES/DRESSINGS) IMPLANT
STRIP CLOSURE SKIN 1/2X4 (GAUZE/BANDAGES/DRESSINGS) IMPLANT
SUT CHROMIC GUT BROWN 0 54 (SUTURE) ×1 IMPLANT
SUT CHROMIC GUT BROWN 0 54IN (SUTURE) ×3
SUT MNCRL 4-0 (SUTURE) ×2
SUT MNCRL 4-0 27XMFL (SUTURE) ×1
SUT PDS AB 1 TP1 96 (SUTURE) ×3 IMPLANT
SUT PLAIN GUT 0 (SUTURE) IMPLANT
SUT VIC AB 0 CTX 36 (SUTURE) ×4
SUT VIC AB 0 CTX36XBRD ANBCTRL (SUTURE) ×2 IMPLANT
SUT VICRYL/POLYSORB 3.0 (SUTURE) ×3 IMPLANT
SUTURE MNCRL 4-0 27XMF (SUTURE) ×1 IMPLANT
SWABSTK COMLB BENZOIN TINCTURE (MISCELLANEOUS) IMPLANT

## 2017-08-30 NOTE — Anesthesia Preprocedure Evaluation (Signed)
Anesthesia Evaluation  Patient identified by MRN, date of birth, ID band Patient awake    Reviewed: Allergy & Precautions, NPO status , Patient's Chart, lab work & pertinent test results  History of Anesthesia Complications Negative for: history of anesthetic complications  Airway Mallampati: II  TM Distance: >3 FB Neck ROM: Full    Dental no notable dental hx.    Pulmonary asthma (mild intermittent) , Current Smoker,    breath sounds clear to auscultation- rhonchi (-) wheezing      Cardiovascular Exercise Tolerance: Good (-) hypertension(-) CAD, (-) Past MI, (-) Cardiac Stents and (-) CABG  Rhythm:Regular Rate:Normal - Systolic murmurs and - Diastolic murmurs    Neuro/Psych negative neurological ROS  negative psych ROS   GI/Hepatic negative GI ROS, Neg liver ROS,   Endo/Other  negative endocrine ROS  Renal/GU negative Renal ROS     Musculoskeletal negative musculoskeletal ROS (+)   Abdominal (+) - obese, Gravid abdomen  Peds  Hematology negative hematology ROS (+)   Anesthesia Other Findings Pt refusing vaginal delivery, no prior cesarean delivery  UDS positive for cocaine  Past Medical History: No date: Asthma No date: Endometriosis No date: Ovarian cyst   Reproductive/Obstetrics (+) Pregnancy                             Lab Results  Component Value Date   WBC 9.9 08/29/2017   HGB 13.3 08/29/2017   HCT 39.0 08/29/2017   MCV 85.5 08/29/2017   PLT 253 08/29/2017    Anesthesia Physical Anesthesia Plan  ASA: II  Anesthesia Plan: Epidural   Post-op Pain Management:    Induction:   PONV Risk Score and Plan: 1 and Ondansetron  Airway Management Planned: Natural Airway  Additional Equipment:   Intra-op Plan:   Post-operative Plan:   Informed Consent: I have reviewed the patients History and Physical, chart, labs and discussed the procedure including the risks,  benefits and alternatives for the proposed anesthesia with the patient or authorized representative who has indicated his/her understanding and acceptance.   Dental advisory given  Plan Discussed with: CRNA and Anesthesiologist  Anesthesia Plan Comments: (Plan for epidural to allow gradual dosing rather than spinal given positive UDS)        Anesthesia Quick Evaluation

## 2017-08-30 NOTE — Progress Notes (Signed)
Patient very irate, demanding to have a c-section right now because her baby "doesn't have any fluid around it".  Plan of care reviewed again with patient, plan to continued fetal monitoring, betamethasone at 1400 and continued IV treatment for GBS. Patient demanding to see the MD.  CNM called and notified.

## 2017-08-30 NOTE — Transfer of Care (Signed)
Immediate Anesthesia Transfer of Care Note  Patient: Colleen Pearson  Procedure(s) Performed: CESAREAN SECTION (N/A )  Patient Location: PACU  Anesthesia Type:Epidural  Level of Consciousness: awake and alert   Airway & Oxygen Therapy: Patient Spontanous Breathing  Post-op Assessment: Report given to RN and Post -op Vital signs reviewed and stable  Post vital signs: Reviewed and stable  Last Vitals:  Vitals:   08/30/17 1500 08/30/17 1646  BP: 118/60 131/69  Pulse: 67 61  Resp: 19   Temp: 36.7 C     Last Pain:  Vitals:   08/30/17 1628  TempSrc:   PainSc: 8       Patients Stated Pain Goal: 0 (08/29/17 2134)  Complications: No apparent anesthesia complications

## 2017-08-30 NOTE — Discharge Summary (Addendum)
OB Discharge Summary     Patient Name: Colleen Pearson DOB: 18-May-1991 MRN: [id number]  Date of admission: 08/29/2017 Delivering MD: Thomasene Mohair, MD  Date of Delivery: 08/30/2017  Date of discharge: 09/02/2017  Admitting diagnosis: 36 weeks Intrauterine pregnancy: [redacted]w[redacted]d     Secondary diagnosis: SROM, labor, cocaine use     Discharge diagnosis: Preterm Pregnancy Delivered                                                                                                Post partum procedures: none  Augmentation: none  Complications: None  Hospital course:  The patient was admitted on 08/29/17 with labor. She stopped laboring at 3cm. She was seen at [facility] on 08/30/17 and was found to have oligohydramnios, but did have a 2x2cm pocket of fluid. After returning to the floor she had spontaneous rupture of membranes and the onset of labor.  Upon admission she was given Ancef for GBS prophylaxis.  The patient also received betamethasone on 3/10 and 3/11. Given she was laboring and had SROM, the plan was to augment her labor, if she did not progress. However, she adamantly requested elective primary cesarean delivery.   After extensive counseling she was ultimately offered cesarean delivery, which she underwent on 08/30/17 with epidural anesthesia without incident.  Her postpartum course was routine, and she was discharged in good condition, tolerating a regular diet, ambulating and voiding without difficulty, with adequate pain control.  Physical exam  Vitals:   09/01/17 0747 09/01/17 1543 09/01/17 2314 09/02/17 0730  BP: (!) 117/52 127/65 129/80 117/71  Pulse: 65 63 (!) 59 (!) 57  Resp: 18 18 18 18   Temp: 97.9 F (36.6 C) 97.7 F (36.5 C) 98.4 F (36.9 C) 98.2 F (36.8 C)  TempSrc: Oral Oral Oral Oral  SpO2: 100% 100% 100% 99%  Weight:      Height:       General: alert, cooperative and no distress Lochia: appropriate Uterine Fundus: firm Incision: Dressing is clean, dry,  and intact DVT Evaluation: No evidence of DVT seen on physical exam.  Labs: Lab Results  Component Value Date   WBC 20.8 (H) 08/31/2017   HGB 11.1 (L) 08/31/2017   HCT 32.5 (L) 08/31/2017   MCV 85.4 08/31/2017   PLT 207 08/31/2017    Discharge instruction: per After Visit Summary.  Medications:  Allergies as of 09/02/2017      Reactions   Penicillins Hives   Tramadol Hives      Medication List    TAKE these medications   nicotine 21 mg/24hr patch Commonly known as:  NICODERM CQ - dosed in mg/24 hours Place 1 patch (21 mg total) onto the skin daily.   oxyCODONE-acetaminophen 5-325 MG tablet Commonly known as:  PERCOCET/ROXICET Take 1-2 tablets by mouth every 6 (six) hours as needed.   prenatal multivitamin Tabs tablet Take 1 tablet by mouth daily at 12 noon.            Discharge Care Instructions  (From admission, onward)        Start     Ordered  09/02/17 0000  Discharge wound care:    Comments:  You may apply a light dressing for minor discharge from the incision or to keep waistbands of clothing from rubbing.  You may also have been discharge with a clear dressing in which case this will be removed at your postoperative clinic visit.  You may shower, use soap on your incision.  Avoid baths or soaking the incision in the first 6 weeks following your surgery.Marland Kitchen.   09/02/17 1050      Diet: routine diet  Activity: Advance as tolerated. Pelvic rest for 6 weeks.   Outpatient follow up: Follow-up Information    Conard NovakJackson, Stephen D, MD. Schedule an appointment as soon as possible for a visit in 1 week(s).   Specialty:  Obstetrics and Gynecology Why:  postop incision check Contact information: [address] [place] [address] [phone]             Postpartum contraception: Nexplanon Rhogam Given postpartum: no Rubella vaccine given postpartum: no Varicella vaccine given postpartum: no TDaP given antepartum or postpartum: antepartum  08/06/2017  Newborn Data: Live born female  Birth Weight: 5 lb 6.1 oz (2440 g) APGAR: 8, 8  Newborn Delivery   Birth date/time:  08/30/2017 19:28:00 Delivery type:  C-Section, Low Transverse C-section categorization:  Primary      Baby Feeding: Formula  Disposition: home with mother (supervised by grandmother; open DSS case).  SIGNED: Marcelyn BruinsJacelyn Schmid, CNM 09/02/2017  11:40 AM

## 2017-08-30 NOTE — Progress Notes (Signed)
Maryruth EveJaci Schmid, CNM at bedside discussing plan of care with patient and family.

## 2017-08-30 NOTE — Anesthesia Post-op Follow-up Note (Signed)
Anesthesia QCDR form completed.        

## 2017-08-30 NOTE — Progress Notes (Signed)
Pt transported per RN via bed to special care nursery.

## 2017-08-30 NOTE — Progress Notes (Signed)
Patient ID: Colleen Pearson, female   DOB: [birth year], 27 y.o.   MRN: [id number]  Patient now with SROM and labor. Her cervix has changed to 3.5 from 3 cm.   She is painfully contracting. She adamantly requests a c-section. Discussed reasoning with patient, who states that she does not want to push and is afraid to do so. She claims she has seizures and is worried she will not be able to push.  Discussed that with epidural (anesthesia has agreed to place) she should have excellent pain control and pushing will not be painful.  She is quite adamant still about having a cesarean delivery. Risks of cesarean section explained and it was explained that newborns tend to do better overall after vaginal delivery.   She understands that future pregnancies will be affected by her c-section. She was warned about risk of placenta accreta and the potentially very grave nature of this condition. She was explained thoroughly the risks of major surgery versus risks of a vaginal delivery. It was explained to her that she would have a faster recovery from a vaginal delivery. With all the counseling provided, the patient remains steadfast in her desire for a cesarean delivery. Based upon the ethical principal of autonomy, I assented to her informed decision.  To OR when able.  FWB: reassuring at this time with category 1 tracing, though tracing is intermittent.   Thomasene MohairStephen Freya Zobrist, MD, Merlinda FrederickFACOG [facility], [facility] 08/30/2017 6:27 PM

## 2017-08-30 NOTE — Progress Notes (Signed)
Pt transported per L&D RN from special care nursery to MB unit via bed.

## 2017-08-30 NOTE — Progress Notes (Signed)
Patient and family irate, voices raised, demanding c-section of providers. Provider (CNM) and Nursing supervisor at bedside attempting to education patient and family members on best practices, best plan of care concerning patients history and current EFM tracing.

## 2017-08-30 NOTE — Op Note (Signed)
Cesarean Section Operative Note    Colleen Pearson   08/30/2017   Pre-operative Diagnosis:  1) intrauterine pregnancy at [redacted]w[redacted]d 2) active labor 3) spontaneous rupture of membranes 4) desired elective cesarean section 5) cocaine use  Post-operative Diagnosis:  1) intrauterine pregnancy at [redacted]w[redacted]d 2) active labor 3) spontaneous rupture of membranes 4) desired elective cesarean section 5) cocaine use   Procedure: Primary Low Transverse Cesarean Section via Pfannenstiel incision with double-layer uterine closure  Surgeon: Surgeon(s) and Role:    Conard Novak, MD - Primary   Assistants: Maryruth Eve, CNM  Anesthesia: epidural   Findings:  1) normal appearing gravid uterus, fallopian tubes, and ovaries 2) viable female infant with weight of 2,440 grams (5 lb 6 oz), APGARs 8 and 8.   Estimated Blood Loss: 700 mL  Total IV Fluids: 1,000 ml crystalloid  Urine Output: 100 mL clear urine at end of case  Specimens: none  Complications: no complications  Disposition: PACU - hemodynamically stable.   Maternal Condition: stable   Baby condition / location:  NICU  Procedure Details:  The patient was seen in the Holding Room. The risks, benefits, complications, treatment options, and expected outcomes were discussed with the patient. The patient concurred with the proposed plan, giving informed consent. identified as Milus Banister and the procedure verified as C-Section Delivery. A Time Out was held and the above information confirmed.   After induction of anesthesia, the patient was draped and prepped in the usual sterile manner. A Pfannenstiel incision was made and carried down through the subcutaneous tissue to the fascia. Fascial incision was made and extended transversely. The fascia was separated from the underlying rectus tissue superiorly and inferiorly. The peritoneum was identified and entered. Peritoneal incision was extended longitudinally. The bladder flap  was not freed from the lower uterine segment. A low transverse uterine incision was made and the hysterotomy was extended with cranial-caudal tension. Delivered from cephalic presentation was a 2,440 gram Living newborn infant(s) or Female with Apgar scores of 8 at one minute and 8 at five minutes. Cord ph was not sent the umbilical cord was clamped and cut cord blood was obtained for evaluation. The placenta was removed Intact and appeared normal. The uterine outline, tubes and ovaries appeared normal. The uterine incision was closed with running locked sutures of 0 Vicryl.  A second layer of the same suture was thrown in an imbricating fashion.  Hemostasis was assured.  The uterus was returned to the abdomen and the paracolic gutters were cleared of all clots and debris.  The rectus muscles were inspected and found to be hemostatic.  The On-Q catheter pumps were inserted in accordance with the manufacturer's recommendations.  The catheters were inserted approximately 4cm cephelad to the incision line, approximately 1cm apart, straddling the midline.  They were inserted to a depth of the 4th mark. They were positioned superficial to the rectus abdominus muscles and deep to the rectus fascia.    The fascia was then reapproximated with running sutures of 1-0 PDS, looped. Three interrupted 3-0 vicryl stitches were thrown to reduce tension on the skin closure. The subcuticular closure was performed using 4-0 monocryl. The skin closure was reinforced using surgical skin glue.  The On-Q catheters were bolused with 5 mL of 0.5% marcaine plain for a total of 10 mL.  The catheters were affixed to the skin with surgical skin glue, steri-strips, and tegaderm.    Instrument, sponge, and needle counts were correct prior the  abdominal closure and were correct at the conclusion of the case.  The patient received Ancef 2 gram IV prior to skin incision (within 30 minutes). For VTE prophylaxis she was wearing SCDs throughout  the case.  Signed: Conard NovakStephen D. Sherlene Rickel, MD 08/30/2017 8:14 PM

## 2017-08-30 NOTE — Anesthesia Procedure Notes (Signed)
Epidural Patient location during procedure: OB Start time: 08/30/2017 6:50 PM End time: 08/30/2017 6:56 PM  Staffing Anesthesiologist: Alver FisherPenwarden, Makinlee Awwad, MD Performed: anesthesiologist   Preanesthetic Checklist Completed: patient identified, site marked, surgical consent, pre-op evaluation, timeout performed, IV checked, risks and benefits discussed and monitors and equipment checked  Epidural Patient position: sitting Prep: ChloraPrep Patient monitoring: heart rate, continuous pulse ox and blood pressure Approach: midline Location: L3-L4 Injection technique: LOR saline  Needle:  Needle type: Tuohy  Needle gauge: 18 G Needle length: 9 cm and 9 Needle insertion depth: 6 cm Catheter type: closed end flexible Catheter size: 20 Guage Catheter at skin depth: 10 cm Test dose: negative and 2% lidocaine with Epi 1:200 K  Assessment Events: blood not aspirated, injection not painful, no injection resistance, negative IV test and no paresthesia  Additional Notes   Patient tolerated the insertion well without complications.Reason for block:surgical anesthesia

## 2017-08-30 NOTE — Progress Notes (Signed)
Was informed patient was upset with her plan of care and wanted to have a C-section. Heard loud voices in the room and profanity from patient.Monica Newton RN and Maryruth EveJaci Schmid CNMW at bed side speaking calmly to patient. Patient was demanding to have a C-section. The CNMW explained there were no indications for a c/s at this time. Explained the reasons why. Patient speaking loudly, demanding to have a c/s. Staff speaking calmly to the patient. Family present. Family stating they want to have a c/s scheduled, relating things that had happened in their experience.CNMW explained each pregnancy is different. Again explained the plan of care, what was being done to monitor and keep the baby and the patient safe, why at this time a C/S was not indicated at . Patient repeating multiple times " I just want to have a C-section, I am not having this baby naturally, I have decided I am going to have a c/s and will not have this baby naturally". Patient stated she wanted to talk with Dr. Jean RosenthalJackson, Shelby BingJaci said Dr. Jean RosenthalJackson would see her after office hours, sometime after 1730

## 2017-08-30 NOTE — Progress Notes (Signed)
Subjective:  Patient with labile mood, calm at this time. Reports painful contractions.  Objective:   Vitals: Blood pressure (!) 131/58, pulse 63, temperature 98 F (36.7 C), temperature source Oral, resp. rate 17, height 5' (1.524 m), weight 130 lb (59 kg). General: anxious, cooperative Abdomen: mild Cervical Exam:  Dilation: 3 Effacement (%): 100 Cervical Position: Middle Station: 0 Presentation: Vertex  FHT: baseline 130, moderate variability, accelerations present, one prolonged deceleration to 60-70s with good recovery to baseline.  Toco: irregular contractions  Results for orders placed or performed during the hospital encounter of 08/29/17 (from the past 24 hour(s))  CBC     Status: Abnormal   Collection Time: 08/29/17  1:30 PM  Result Value Ref Range   WBC 9.9 3.6 - 11.0 K/uL   RBC 4.56 3.80 - 5.20 MIL/uL   Hemoglobin 13.3 12.0 - 16.0 g/dL   HCT 16.139.0 09.635.0 - 04.547.0 %   MCV 85.5 80.0 - 100.0 fL   MCH 29.2 26.0 - 34.0 pg   MCHC 34.1 32.0 - 36.0 g/dL   RDW 40.915.6 (H) 81.111.5 - 91.414.5 %   Platelets 253 150 - 440 K/uL  Type and screen [facility]     Status: None   Collection Time: 08/29/17  1:30 PM  Result Value Ref Range   ABO/RH(D) O POS    Antibody Screen NEG    Sample Expiration      09/01/2017 Performed at [facility], [address] [place], [place] [id number]     Assessment:   27 y.o. G3P0010 523w2d admitted with contractions.  Plan:   1) Labor - has not made cervical change since yesterday at 1556. Latent labor with irregular, painful contractions.   2) Fetus - FWB Category II, continue to monitor closely.  3) Per [facility] Perinatal recommendation, re-assessed for ruptured membranes. Patient denies leaking fluid and no amniotic fluid seen on exam. Collected ROM plus and sample sent to lab.  4) Growth ultrasound showed oligohydramnios with AFI of 3.7. Recommendation to keep patient in house until 37 weeks for IOL at that time, unless  ROM occurs, in which case consider labor augmentation. Rescan for AFI at 37 weeks if not delivered before that time.  5) Give second dose of betamethasone at 1400.  Plan discussed with patient, see orders.  Marcelyn BruinsJacelyn Schmid, CNM 08/30/2017  11:45 AM

## 2017-08-31 ENCOUNTER — Encounter: Payer: Self-pay | Admitting: Obstetrics and Gynecology

## 2017-08-31 DIAGNOSIS — O9081 Anemia of the puerperium: Secondary | ICD-10-CM

## 2017-08-31 DIAGNOSIS — D62 Acute posthemorrhagic anemia: Secondary | ICD-10-CM

## 2017-08-31 DIAGNOSIS — O99824 Streptococcus B carrier state complicating childbirth: Secondary | ICD-10-CM

## 2017-08-31 LAB — CBC
HCT: 32.5 % — ABNORMAL LOW (ref 35.0–47.0)
Hemoglobin: 11.1 g/dL — ABNORMAL LOW (ref 12.0–16.0)
MCH: 29.2 pg (ref 26.0–34.0)
MCHC: 34.3 g/dL (ref 32.0–36.0)
MCV: 85.4 fL (ref 80.0–100.0)
PLATELETS: 207 10*3/uL (ref 150–440)
RBC: 3.81 MIL/uL (ref 3.80–5.20)
RDW: 15.8 % — AB (ref 11.5–14.5)
WBC: 20.8 10*3/uL — AB (ref 3.6–11.0)

## 2017-08-31 LAB — URINE CULTURE: Culture: NO GROWTH

## 2017-08-31 LAB — RPR: RPR: NONREACTIVE

## 2017-08-31 MED ORDER — NICOTINE 21 MG/24HR TD PT24
21.0000 mg | MEDICATED_PATCH | Freq: Every day | TRANSDERMAL | Status: DC
  Administered 2017-08-31 – 2017-09-01 (×2): 21 mg via TRANSDERMAL
  Filled 2017-08-31 (×2): qty 1

## 2017-08-31 MED ORDER — PROMETHAZINE HCL 25 MG/ML IJ SOLN
25.0000 mg | Freq: Four times a day (QID) | INTRAMUSCULAR | Status: DC | PRN
  Administered 2017-08-31: 25 mg via INTRAVENOUS
  Filled 2017-08-31: qty 1

## 2017-08-31 NOTE — Progress Notes (Signed)
  Subjective:   Post Op Day 1 LTCS. Patient admits tolerating PO intake and good pain control with IV and PO medications. She also has On Q pump.   Objective:  Blood pressure 111/63, pulse 63, temperature 97.8 F (36.6 C), temperature source Oral, resp. rate 18, height 5' (1.524 m), weight 130 lb (59 kg), SpO2 100 %.  General: NAD Pulmonary: no increased work of breathing Abdomen: non-distended, non-tender, fundus firm at level of umbilicus Incision: honeycomb dressing is c/d/i, On Q pump has some sero-sang drainage on gauze Extremities: no edema, no erythema, no tenderness  Results for orders placed or performed during the hospital encounter of 08/29/17 (from the past 24 hour(s))  ROM Plus ([facility] only)     Status: None   Collection Time: 08/30/17 11:30 AM  Result Value Ref Range   Rom Plus NEGATIVE   Urinalysis, Complete w Microscopic     Status: Abnormal   Collection Time: 08/30/17 11:47 AM  Result Value Ref Range   Color, Urine YELLOW (A) YELLOW   APPearance HAZY (A) CLEAR   Specific Gravity, Urine 1.008 1.005 - 1.030   pH 7.0 5.0 - 8.0   Glucose, UA 50 (A) NEGATIVE mg/dL   Hgb urine dipstick SMALL (A) NEGATIVE   Bilirubin Urine NEGATIVE NEGATIVE   Ketones, ur NEGATIVE NEGATIVE mg/dL   Protein, ur NEGATIVE NEGATIVE mg/dL   Nitrite NEGATIVE NEGATIVE   Leukocytes, UA LARGE (A) NEGATIVE   RBC / HPF 0-5 0 - 5 RBC/hpf   WBC, UA 0-5 0 - 5 WBC/hpf   Bacteria, UA RARE (A) NONE SEEN   Squamous Epithelial / LPF 0-5 (A) NONE SEEN  CBC     Status: Abnormal   Collection Time: 08/31/17  5:02 AM  Result Value Ref Range   WBC 20.8 (H) 3.6 - 11.0 K/uL   RBC 3.81 3.80 - 5.20 MIL/uL   Hemoglobin 11.1 (L) 12.0 - 16.0 g/dL   HCT 32.5 (L) 35.0 - 47.0 %   MCV 85.4 80.0 - 100.0 fL   MCH 29.2 26.0 - 34.0 pg   MCHC 34.3 32.0 - 36.0 g/dL   RDW 15.8 (H) 11.5 - 14.5 %   Platelets 207 150 - 440 K/uL    Intake/Output Summary (Last 24 hours) at 08/31/2017 1052 Last data filed at 08/31/2017  1023 Gross per 24 hour  Intake 840 ml  Output 2275 ml  Net -1435 ml      Assessment:   27 y.o. Pearson postoperativeday # 1   Plan:  1) Acute blood loss anemia - hemodynamically stable and asymptomatic - po ferrous sulfate  2) O positive, MMR x2, Varicella Immune  3) TDAP status needs postpartum  4) Fomula/Contraception: undecided  5) Social work consult- order has been placed  6) Disposition: discharge to home depending on progress  Rod Can, CNM

## 2017-08-31 NOTE — Clinical Social Work Maternal (Addendum)
CLINICAL SOCIAL WORK MATERNAL/CHILD NOTE  Patient Details  Name: Colleen Pearson MRN: [id number] Date of Birth: 1991/06/20  Date:  08/31/2017  Clinical Social Worker Initiating Note:  Shela Leff MSW,LCSW Date/Time: Initiated:  08/31/17/      Child's Name:      Biological Parents:  Mother   Need for Interpreter:  None   Reason for Referral:  Current Substance Use/Substance Use During Pregnancy    Address:  [address] [place] [address]    Phone number:  [phone] (home)     Additional phone number: none  Household Members/Support Persons (HM/SP):       HM/SP Name Relationship DOB or Age  HM/SP -1        HM/SP -2        HM/SP -3        HM/SP -4        HM/SP -5        HM/SP -6        HM/SP -7        HM/SP -8          Natural Supports (not living in the home):  Extended Family   Professional Supports: None   Employment: Full-time   Type of Work:     Education:      Homebound arranged:    Museum/gallery curator Resources:  Medicaid   Other Resources:  [employer], Physicist, medical    Cultural/Religious Considerations Which May Impact Care:  none  Strengths:  Ability to meet basic needs , Home prepared for child    Psychotropic Medications:         Pediatrician:       Pediatrician List:   [place]      Pediatrician Fax Number:    Risk Factors/Current Problems:  Substance Use    Cognitive State:  Alert , Able to Concentrate , Poor Insight    Mood/Affect:  Calm , Tearful , Agitated    CSW Assessment: CSW met with patient and her mother in patient's room this afternoon. CSW explained role and purpose of visit. Patient gave permission for her mother to remain in the room during assessment.Patient reports that she will be living with her mother indefinitely and patient's mother confirmed this. Patient went to live with her mother 2 weeks ago. Patient and  mother confirm that they have all supplies for her newborn and have no concerns regarding necessities. Patient and mother report that they have transportation. They have medicaid, [place], and food stamps. This is patient's first child.   CSW addressed education surrounding postpartum depression. Patient and mother confirm that patient was diagnosed with bipolar by [facility] agency but was never put on medication so they are not sure how accurate the diagnosis has been. CSW addressed patient and newborn being cocaine positive and CSW explained that a DSS CPS report would need to be made. As CSW explained the DSS CPS process, patient became very defensive, very agitated, yelling, and crying. Patient kept thinking DSS CPS was going to take her newborn away from her. CSW and patient's mother deescalated patient and CSW discussed at length with patient mom what DSS CPS process would be. We discussed that DSS may assign kinship placement and if her mother is found to be a suitable caregiver, that DSS may consider patient's mother as the principal caregiver and provide supervision for  patient while caring for her newborn. CSW answered many questions. Once assessment completed, CSW made the DSS CPS report to [facility] and also provided information that patient's nurse provided to [place] which entailed patient having difficulty with infant's feeding regimen and keeping patient's infant's temperature regulated. Patient and mother aware that DSS may come today to see her or tomorrow.   CSW Plan/Description:  Child Protective Service Report     Shela Leff, LCSW 08/31/2017, 2:30 PM

## 2017-09-01 ENCOUNTER — Encounter: Payer: Self-pay | Admitting: Certified Nurse Midwife

## 2017-09-01 DIAGNOSIS — D62 Acute posthemorrhagic anemia: Secondary | ICD-10-CM

## 2017-09-01 DIAGNOSIS — O9081 Anemia of the puerperium: Secondary | ICD-10-CM

## 2017-09-01 DIAGNOSIS — O99824 Streptococcus B carrier state complicating childbirth: Secondary | ICD-10-CM

## 2017-09-01 MED ORDER — FERROUS SULFATE 325 (65 FE) MG PO TABS
325.0000 mg | ORAL_TABLET | Freq: Every day | ORAL | Status: DC
  Administered 2017-09-02: 325 mg via ORAL
  Filled 2017-09-01: qty 1

## 2017-09-01 NOTE — Progress Notes (Signed)
POD #2 Elective primary Cesarean section Subjective:  Tolerating regular diet. Passing flatus. OOB and ambulating. HAs noticed more swelling in her ankles since she has been up out of bed. Voiding without difficulty. ON Q pump leaking. Seems to be comfortable. Bottle feeding baby. DSS will be here to see patient this AM.   Objective:  Blood pressure (!) 117/52, pulse 65, temperature 97.9 F (36.6 C), temperature source Oral, resp. rate 18, height 5' (1.524 m), weight 59 kg (130 lb), SpO2 100 %.  General: WF in NAD Heart: RRR without murmur Pulmonary: no increased work of breathing, CTAB Abdomen: non-distended, non-tender, BS present x 4 Incision: Dressing over ON Q pump saturated and leaking onto honey comb dressing. Both dressings changed and more Dermabond placed at the base of the catheters. Extremities: ankle edema present bilaterally, no erythema, no tenderness  Results for orders placed or performed during the hospital encounter of 08/29/17 (from the past 72 hour(s))  Urine Drug Screen, Qualitative ([facility] only)     Status: Abnormal   Collection Time: 08/29/17 11:10 AM  Result Value Ref Range   Tricyclic, Ur Screen NONE DETECTED NONE DETECTED   Amphetamines, Ur Screen NONE DETECTED NONE DETECTED   MDMA (Ecstasy)Ur Screen NONE DETECTED NONE DETECTED   Cocaine Metabolite,Ur [place] POSITIVE (A) NONE DETECTED   Opiate, Ur Screen NONE DETECTED NONE DETECTED   Phencyclidine (PCP) Ur S NONE DETECTED NONE DETECTED   Cannabinoid 50 Ng, Ur [place] NONE DETECTED NONE DETECTED   Barbiturates, Ur Screen NONE DETECTED NONE DETECTED   Benzodiazepine, Ur Scrn NONE DETECTED NONE DETECTED   Methadone Scn, Ur NONE DETECTED NONE DETECTED    Comment: (NOTE) Tricyclics + metabolites, urine    Cutoff 1000 ng/mL Amphetamines + metabolites, urine  Cutoff 1000 ng/mL MDMA (Ecstasy), urine              Cutoff 500 ng/mL Cocaine Metabolite, urine          Cutoff 300 ng/mL Opiate + metabolites, urine        Cutoff  300 ng/mL Phencyclidine (PCP), urine         Cutoff 25 ng/mL Cannabinoid, urine                 Cutoff 50 ng/mL Barbiturates + metabolites, urine  Cutoff 200 ng/mL Benzodiazepine, urine              Cutoff 200 ng/mL Methadone, urine                   Cutoff 300 ng/mL The urine drug screen provides only a preliminary, unconfirmed analytical test result and should not be used for non-medical purposes. Clinical consideration and professional judgment should be applied to any positive drug screen result due to possible interfering substances. A more specific alternate chemical method must be used in order to obtain a confirmed analytical result. Gas chromatography / mass spectrometry (GC/MS) is the preferred confirmat ory method. Performed at [facility], [address] [place], [place] [address]   [place] rt PCR [facility] only)     Status: None   Collection Time: 08/29/17 11:10 AM  Result Value Ref Range   Specimen source GC/Chlam URINE, RANDOM    Chlamydia Tr NOT DETECTED NOT DETECTED   N gonorrhoeae NOT DETECTED NOT DETECTED    Comment: (NOTE) 100  This methodology has not been evaluated in pregnant women or in 200  patients with a history of hysterectomy. 300 400  This methodology will not be  performed on patients less than 46  years of age. Performed at [facility], [address] [place], [place] [address]   CBC     Status: Abnormal   Collection Time: 08/29/17  1:30 PM  Result Value Ref Range   WBC 9.9 3.6 - 11.0 K/uL   RBC 4.56 3.80 - 5.20 MIL/uL   Hemoglobin 13.3 12.0 - 16.0 g/dL   HCT 39.0 35.0 - 47.0 %   MCV 85.5 80.0 - 100.0 fL   MCH 29.2 26.0 - 34.0 pg   MCHC 34.1 32.0 - 36.0 g/dL   RDW 15.6 (H) 11.5 - 14.5 %   Platelets 253 150 - 440 K/uL    Comment: Performed at [facility], [address] [place], [place] [address]  Type and screen [place]     Status: None   Collection Time: 08/29/17  1:30 PM   Result Value Ref Range   ABO/RH(D) O POS    Antibody Screen NEG    Sample Expiration      09/01/2017 Performed at [facility], [address] [place], [place] [address]   RPR     Status: None   Collection Time: 08/29/17  1:30 PM  Result Value Ref Range   RPR Ser Ql Non Reactive Non Reactive    Comment: (NOTE) Performed At: [facility] [place], [place] [id number] Rush Farmer MD [phone] Performed at [facility], [address] [place], [place] [address]   ROM Plus [facility] only)     Status: None   Collection Time: 08/30/17 11:30 AM  Result Value Ref Range   Rom Plus NEGATIVE     Comment: Performed at [facility], [address] [place], [place] [address]  Urinalysis, Complete w Microscopic     Status: Abnormal   Collection Time: 08/30/17 11:47 AM  Result Value Ref Range   Color, Urine YELLOW (A) YELLOW   APPearance HAZY (A) CLEAR   Specific Gravity, Urine 1.008 1.005 - 1.030   pH 7.0 5.0 - 8.0   Glucose, UA 50 (A) NEGATIVE mg/dL   Hgb urine dipstick SMALL (A) NEGATIVE   Bilirubin Urine NEGATIVE NEGATIVE   Ketones, ur NEGATIVE NEGATIVE mg/dL   Protein, ur NEGATIVE NEGATIVE mg/dL   Nitrite NEGATIVE NEGATIVE   Leukocytes, UA LARGE (A) NEGATIVE   RBC / HPF 0-5 0 - 5 RBC/hpf   WBC, UA 0-5 0 - 5 WBC/hpf   Bacteria, UA RARE (A) NONE SEEN   Squamous Epithelial / LPF 0-5 (A) NONE SEEN    Comment: Performed at [facility], [address] [place], [place] [address]  Urine Culture     Status: None   Collection Time: 08/30/17 11:47 AM  Result Value Ref Range   Specimen Description      URINE, RANDOM Performed at [facility], [address] [place], [place] [address]    Special Requests      NONE Performed at [facility], [address], [place], [place] [address]    Culture      NO GROWTH Performed at [facility], [place] [address], [place], [place] [address]    Report  Status 08/31/2017 FINAL   CBC     Status: Abnormal   Collection Time: 08/31/17  5:02 AM  Result Value Ref Range   WBC 20.8 (H) 3.6 - 11.0 K/uL   RBC 3.81 3.80 - 5.20 MIL/uL   Hemoglobin 11.1 (L)  12.0 - 16.0 g/dL   HCT 32.5 (L) 35.0 - 47.0 %   MCV 85.4 80.0 - 100.0 fL   MCH 29.2 26.0 - 34.0 pg   MCHC 34.3 32.0 - 36.0 g/dL   RDW 15.8 (H) 11.5 - 14.5 %   Platelets 207 150 - 440 K/uL    Comment: Performed at [facility], [address] [place], [place] [address]     Assessment:   27 y.o. [id number] postoperativeday # 2 s/p elective Cesarean section-stable  Continue teaching regarding self and baby care  Encourage ambulation Cocaine abuse-DSS to see patient today. Will need referral to mental health for substance abuse and treatment of bipolar disorder  Plan:  1) Acute blood loss anemia - hemodynamically stable and asymptomatic - po ferrous sulfate  2)O POS/MMR x2, VI  3) TDAP given 08/06/2017  4) Bottle  5) Contraception: undecided  6) Discharge on POD4-per patient request  Dalia Heading, CNM  Dalia Heading, CNM  Dalia Heading, CNM

## 2017-09-01 NOTE — Anesthesia Post-op Follow-up Note (Signed)
  Anesthesia Pain Follow-up Note  Patient: Colleen Pearson  Day #: 2  Date of Follow-up: 09/01/2017 Time: 7:17 AM  Last Vitals:  Vitals:   08/31/17 1537 08/31/17 2341  BP: 114/62 120/76  Pulse: 62 66  Resp: 18 18  Temp: 36.6 C (!) 36.4 C  SpO2:  99%    Level of Consciousness: alert  Pain: mild   Side Effects:None  Catheter Site Exam:clean, dry     Plan: D/C from anesthesia care at surgeon's request  Psalm Schappell

## 2017-09-01 NOTE — Clinical Social Work Note (Signed)
Keri with DSS CPS: [phone] called and left message for CSW last evening to say that he would be by this morning about 10am to see patient. CSW has notified patient's nurse and made patient aware. [place] SpanielMonica Rameen Gohlke MSW,LCSW [phone]

## 2017-09-01 NOTE — Anesthesia Postprocedure Evaluation (Signed)
Anesthesia Post Note  Patient: Milus BanisterKelsey J Gasner  Procedure(s) Performed: CESAREAN SECTION (N/A )  Patient location during evaluation: Mother Baby Anesthesia Type: Epidural Level of consciousness: oriented and awake and alert Pain management: pain level controlled Vital Signs Assessment: post-procedure vital signs reviewed and stable Respiratory status: spontaneous breathing, respiratory function stable and patient connected to nasal cannula oxygen Cardiovascular status: blood pressure returned to baseline and stable Postop Assessment: no headache, no backache and no apparent nausea or vomiting Anesthetic complications: no     Last Vitals:  Vitals:   08/31/17 1537 08/31/17 2341  BP: 114/62 120/76  Pulse: 62 66  Resp: 18 18  Temp: 36.6 C (!) 36.4 C  SpO2:  99%    Last Pain:  Vitals:   09/01/17 0100  TempSrc:   PainSc: 6                  Mahek Schlesinger

## 2017-09-01 NOTE — Clinical Social Work Note (Signed)
CSW spoke with Keri at DSS CPS and followed up with him regarding the visit with patient this morning. Lorina RabonKeri stated that patient will be able to discharge with her newborn but the patient's mother will have to supervise patient in the home. [place] SpanielMonica Asta Corbridge MSW,LCSW [phone]

## 2017-09-02 DIAGNOSIS — O9081 Anemia of the puerperium: Secondary | ICD-10-CM

## 2017-09-02 DIAGNOSIS — D62 Acute posthemorrhagic anemia: Secondary | ICD-10-CM

## 2017-09-02 DIAGNOSIS — O99824 Streptococcus B carrier state complicating childbirth: Secondary | ICD-10-CM

## 2017-09-02 MED ORDER — OXYCODONE-ACETAMINOPHEN 5-325 MG PO TABS: 1.0000 | ORAL_TABLET | Freq: Four times a day (QID) | ORAL | 0 refills | Status: DC | PRN

## 2017-09-02 MED ORDER — NICOTINE 21 MG/24HR TD PT24: 21.0000 mg | MEDICATED_PATCH | TRANSDERMAL | 3 refills | Status: DC

## 2017-09-02 NOTE — Progress Notes (Signed)
Message left with Sherrilyn RistKari with DSS about patient being discharged

## 2017-09-02 NOTE — Progress Notes (Signed)
Patient discharged home. Discharge instructions, prescriptions and follow up appointment given to and reviewed with patient. Patient verbalized understanding. Infant discharged home with patients mother (grandmother of infant). DSS notified of discharge.

## 2017-09-02 NOTE — Plan of Care (Signed)
Vs stable; ambulating well; tolerating regular diet; taking motrin and percocet for pain control; writes down very well baby feedings and diaper changes; pt has been appropriate with baby

## 2017-09-13 ENCOUNTER — Other Ambulatory Visit: Payer: Self-pay | Admitting: *Deleted

## 2017-09-13 DIAGNOSIS — Z8279 Family history of other congenital malformations, deformations and chromosomal abnormalities: Secondary | ICD-10-CM

## 2017-09-13 DIAGNOSIS — O99323 Drug use complicating pregnancy, third trimester: Secondary | ICD-10-CM

## 2017-09-13 DIAGNOSIS — F1490 Cocaine use, unspecified, uncomplicated: Secondary | ICD-10-CM

## 2017-09-14 ENCOUNTER — Encounter: Payer: Self-pay | Admitting: Obstetrics and Gynecology

## 2017-09-14 ENCOUNTER — Ambulatory Visit (INDEPENDENT_AMBULATORY_CARE_PROVIDER_SITE_OTHER): Payer: Medicaid Other | Admitting: Obstetrics and Gynecology

## 2017-09-14 ENCOUNTER — Telehealth: Payer: Self-pay | Admitting: Obstetrics and Gynecology

## 2017-09-14 VITALS — BP 100/70 | Ht 60.0 in | Wt 128.0 lb

## 2017-09-14 DIAGNOSIS — Z09 Encounter for follow-up examination after completed treatment for conditions other than malignant neoplasm: Secondary | ICD-10-CM

## 2017-09-14 NOTE — Telephone Encounter (Signed)
Patient scheduled 4/22 for 6 wk PP w/ nexplanon insertion SDJ

## 2017-09-14 NOTE — Telephone Encounter (Signed)
Noted. Will order to arrive by apt date/time. 

## 2017-09-14 NOTE — Progress Notes (Signed)
   Postoperative Follow-up Patient presents post op from cesarean section  2weeks ago.  Subjective: She denies fever, chills, nausea and vomiting. Eating a regular diet without difficulty. Pain is moderately well controlled with ibuprofen.    Activity: increasing slowly. She denies issues with her incision.    Objective: BP 100/70   Ht 5' (1.524 m)   Wt 128 lb (58.1 kg)   LMP  (LMP Unknown)   BMI 25.00 kg/m   Constitutional: Well nourished, well developed female in no acute distress.  HEENT: normal Skin: Warm and dry.  Abdomen: S/NT/ND, uterine fundus soft and non-tender at U-4 clean, dry, intact and no erythema, induration, warmth, and tenderness Extremity: no edema   Assessment: 27 y.o. s/p cesarean section progressing well  Plan: Patient has done well after surgery with no apparent complications.  I have discussed the post-operative course to date, and the expected progress moving forward.  The patient understands what complications to be concerned about.    Activity plan: increasing slowly  Discussed Nexplanon. Will place at next visit.  Return in about 1 month (around 10/12/2017) for Six Week Postpartum and Nexplanon placement with Dr Jean RosenthalJackson.  Thomasene MohairStephen Vergil Burby, MD 09/14/2017 11:29 AM

## 2017-09-16 ENCOUNTER — Ambulatory Visit: Payer: Medicaid Other

## 2017-10-06 NOTE — Telephone Encounter (Signed)
Patient is needing to reschedule due to work. Patient is schedule 10/19/17 at 8 am with [facility]

## 2017-10-11 ENCOUNTER — Ambulatory Visit: Payer: Medicaid Other | Admitting: Obstetrics and Gynecology

## 2017-10-13 NOTE — Telephone Encounter (Signed)
Noted. Nexplanon still reserved.

## 2017-10-19 ENCOUNTER — Encounter: Payer: Self-pay | Admitting: Obstetrics and Gynecology

## 2017-10-19 ENCOUNTER — Ambulatory Visit (INDEPENDENT_AMBULATORY_CARE_PROVIDER_SITE_OTHER): Payer: Medicaid Other | Admitting: Obstetrics and Gynecology

## 2017-10-19 DIAGNOSIS — Z30017 Encounter for initial prescription of implantable subdermal contraceptive: Secondary | ICD-10-CM | POA: Diagnosis not present

## 2017-10-19 DIAGNOSIS — Z124 Encounter for screening for malignant neoplasm of cervix: Secondary | ICD-10-CM

## 2017-10-19 DIAGNOSIS — Z113 Encounter for screening for infections with a predominantly sexual mode of transmission: Secondary | ICD-10-CM

## 2017-10-19 MED ORDER — ETONOGESTREL 68 MG ~~LOC~~ IMPL: 1.0000 | DRUG_IMPLANT | Freq: Once | SUBCUTANEOUS | 0 refills | Status: DC

## 2017-10-19 NOTE — Progress Notes (Signed)
Postpartum Visit  Chief Complaint:  Chief Complaint  Patient presents with  . 6 week post partum    History of Present Illness: Patient is a 27 y.o. [id number] presents for postpartum visit.  Date of delivery: 08/30/2017 Type of delivery: C-Section Episiotomy No.  Laceration: no  Pregnancy or labor problems:  Preterm delivery, PPROM, cocaine use, trichomonas in pregnancy,  Any problems since the delivery:  no  Newborn Details:  SINGLETON :  1. Baby's name: [name]. Birth weight: 5.6 Maternal Details:  Breast Feeding:  no Post partum depression/anxiety noted:  no Edinburgh Post-Partum Depression Score:  7  Date of last PAP: unknown  Past Medical History:  Diagnosis Date  . Asthma   . Endometriosis   . Ovarian cyst     Past Surgical History:  Procedure Laterality Date  . CESAREAN SECTION N/A 08/30/2017   Procedure: CESAREAN SECTION;  Surgeon: Conard Novak, MD;  Location: [facility] ORS;  Service: Obstetrics;  Laterality: N/A;  . LASER ABLATION/CAUTERIZATION OF ENDOMETRIAL IMPLANTS      Prior to Admission medications   Medication Sig Start Date End Date Taking? Authorizing Provider  Prenatal Vit-Fe Fumarate-FA (PRENATAL MULTIVITAMIN) TABS tablet Take 1 tablet by mouth daily at 12 noon.    [provider]    Allergies  Allergen Reactions  . Penicillins Hives  . Tramadol Hives     Social History   Socioeconomic History  . Marital status: Single    Spouse name: Not on file  . Number of children: Not on file  . Years of education: Not on file  . Highest education level: Not on file  Occupational History  . Not on file  Social Needs  . Financial resource strain: Not on file  . Food insecurity:    Worry: Not on file    Inability: Not on file  . Transportation needs:    Medical: Not on file    Non-medical: Not on file  Tobacco Use  . Smoking status: Current Every Day Smoker    Packs/day: 0.25    Types: Cigarettes  . Smokeless tobacco: Never Used    Substance and Sexual Activity  . Alcohol use: No  . Drug use: Yes    Types: Marijuana, Cocaine  . Sexual activity: Yes    Birth control/protection: None  Lifestyle  . Physical activity:    Days per week: Not on file    Minutes per session: Not on file  . Stress: Not on file  Relationships  . Social connections:    Talks on phone: Not on file    Gets together: Not on file    Attends religious service: Not on file    Active member of club or organization: Not on file    Attends meetings of clubs or organizations: Not on file    Relationship status: Not on file  . Intimate partner violence:    Fear of current or ex partner: Not on file    Emotionally abused: Not on file    Physically abused: Not on file    Forced sexual activity: Not on file  Other Topics Concern  . Not on file  Social History Narrative  . Not on file   Family History: denies gyn cancers in family  Review of Systems  Constitutional: Negative.   HENT: Negative.   Eyes: Negative.   Respiratory: Negative.   Cardiovascular: Negative.   Gastrointestinal: Negative.   Genitourinary: Negative.   Musculoskeletal: Negative.   Skin: Negative.   Neurological:  Negative.   Psychiatric/Behavioral: Negative.      Physical Exam BP 118/74   Ht  (1.549 m)   Wt 138 lb (62.6 kg)   LMP 10/17/2017   BMI 26.07 kg/m   Physical Exam  Constitutional: She is oriented to person, place, and time. She appears well-developed and well-nourished. No distress.  Genitourinary: Vagina normal and uterus normal. Pelvic exam was performed with patient supine. There is no rash, tenderness or lesion on the right labia. There is no rash, tenderness or lesion on the left labia. Vagina exhibits no lesion. No erythema in the vagina. No foreign body in the vagina. No vaginal discharge found. Right adnexum does not display mass, does not display tenderness and does not display fullness. Left adnexum does not display mass, does not display  tenderness and does not display fullness. Cervix does not exhibit motion tenderness, lesion or friability.   Uterus is mobile. Uterus is not enlarged, tender or exhibiting a mass.  Eyes: EOM are normal. No scleral icterus.  Neck: Normal range of motion. Neck supple.  Cardiovascular: Normal rate and regular rhythm.  Pulmonary/Chest: Effort normal and breath sounds normal. No respiratory distress. She has no wheezes. She has no rales.  Abdominal: Soft. Bowel sounds are normal. She exhibits no distension and no mass. There is no tenderness. There is no rebound and no guarding.  Musculoskeletal: Normal range of motion. She exhibits no edema.  Neurological: She is alert and oriented to person, place, and time. No cranial nerve deficit.  Skin: Skin is warm and dry. No erythema.  Psychiatric: She has a normal mood and affect. Her behavior is normal. Judgment normal.    Female Chaperone present during breast and/or pelvic exam.   GYNECOLOGY PROCEDURE NOTE  Patient is a 27 y.o. Z6X0960 presenting for Nexplanon insertion as her desires means of contraception.  She provided informed consent, signed copy in the chart, time out was performed. Pregnancy test was not done (menstruating), with self reported LMP of Patient's last menstrual period was 10/17/2017.  She understands that Nexplanon is a progesterone only therapy, and that patients often patients have irregular and unpredictable vaginal bleeding or amenorrhea. She understands that other side effects are possible related to systemic progesterone, including but not limited to, headaches, breast tenderness, nausea, and irritability. While effective at preventing pregnancy long acting reversible contraceptives do not prevent transmission of sexually transmitted diseases and use of barrier methods for this purpose was discussed. The placement procedure for Nexplanon was reviewed with the patient in detail including risks of nerve injury, infection, bleeding  and injury to other muscles or tendons. She understands that the Nexplanon implant is good for 3 years and needs to be removed at the end of that time.  She understands that Nexplanon is an extremely effective option for contraception, with failure rate of <1%. This information is reviewed today and all questions were answered. Informed consent was obtained, both verbally and written.   The patient is healthy and has no contraindications to Implanon use. Urine pregnancy test was performed today and was negative.  Procedure Appropriate time out taken.  Patient placed in dorsal supine with left arm above head, elbow flexed at 90 degrees, arm resting on examination table with hand behind her head.  The bicipital grove was palpated and site 8-10cm proximal to the medial epicondyle was indentified.  Per the manufacturer's recommendations, the insertion site was marked along a line 3-5 cm posterior (toward the triceps) to the bicipital groove and at  8-10 cm medial to the medial epicondyle. The insertion site was prepped with a two betadine swabs and then injected with 2 cc of 1% lidocaine without epinephrine.  Nexplanon removed form sterile blister packaging,  Device confirmed in needle, before inserting full length of needle, tenting up the skin as the needle was advance.  The drug eluting rod was then deployed by pulling back the slider per the manufactures recommendation.  The implant was palpable by the clinician as well as the patient.  The insertion site covered dressed with a 1/2" steri-strip before applying  a kerlex bandage pressure dressing..Minimal blood loss was noted during the procedure.  The patient tolerated the procedure well.   She was instructed to wear the bandage for 24 hours, call with any signs of infection.  She was given the Implanon card and instructed to have the rod removed in 3 years.  Assessment: 27 y.o. Z6X0960 presenting for 6 week postpartum visit  Plan: Problem List Items  Addressed This Visit    None    Visit Diagnoses    Postpartum care and examination    -  Primary   Nexplanon insertion       Relevant Medications   etonogestrel (NEXPLANON) 68 MG IMPL implant   Pap smear for cervical cancer screening       Relevant Orders   IGP, rfx Aptima HPV ASCU   Screen for STD (sexually transmitted disease)       Relevant Orders   Chlamydia/Gonococcus/Trichomonas, NAA       1) Contraception Education given regarding options for contraception, including Nexplanon. Placed today.Marland Kitchen  2)  Pap - ASCCP guidelines and rational discussed.  Patient opts for routine screening interval. Obtained today, though menstruating. So, may not return with result.  3) Patient underwent screening for postpartum depression with no concerns noted.  4) Follow up 1 year for routine annual exam  Thomasene Mohair, MD 10/19/2017 8:40 AM

## 2017-10-21 LAB — IGP, RFX APTIMA HPV ASCU: PAP SMEAR COMMENT: 0

## 2017-10-21 LAB — CHLAMYDIA/GONOCOCCUS/TRICHOMONAS, NAA
Chlamydia by NAA: NEGATIVE
Gonococcus by NAA: NEGATIVE
Trich vag by NAA: POSITIVE — AB

## 2017-10-25 ENCOUNTER — Telehealth: Payer: Self-pay

## 2017-10-25 NOTE — Telephone Encounter (Signed)
Please let me know when pt callsback ?

## 2017-10-25 NOTE — Telephone Encounter (Signed)
-----   Message from Conard Novak, MD sent at 10/22/2017  2:55 PM EDT ----- Would you mind letting Colleen Pearson know that her testing came back positive for trichomonas?  She needs treatment and we can send that in too.  Her partner needs to be treated, otherwise she will continue to get this infection again and again. Please let me know once she has received this and I can send in her Rx. Thanks!

## 2017-11-04 NOTE — Telephone Encounter (Signed)
Calling # we have on file again, busy tone. Please let me know if she returns my first call. Have not been able to get in touch with pt

## 2017-11-18 ENCOUNTER — Telehealth: Payer: Self-pay | Admitting: Obstetrics and Gynecology

## 2017-11-18 ENCOUNTER — Other Ambulatory Visit: Payer: Self-pay | Admitting: Obstetrics and Gynecology

## 2017-11-18 DIAGNOSIS — A5901 Trichomonal vulvovaginitis: Secondary | ICD-10-CM

## 2017-11-18 MED ORDER — METRONIDAZOLE 500 MG PO TABS: 2000.0000 mg | ORAL_TABLET | Freq: Every day | ORAL | 0 refills | Status: AC

## 2017-11-18 NOTE — Telephone Encounter (Signed)
Patient aware of trich results.  Will send in rx. She understands her partner needs to be treated in order for her not to get re-infected.

## 2018-07-21 ENCOUNTER — Other Ambulatory Visit: Payer: Self-pay

## 2018-07-21 ENCOUNTER — Emergency Department
Admission: EM | Admit: 2018-07-21 | Discharge: 2018-07-21 | Disposition: A | Discharge status: Home / Self Care | Payer: Medicaid Other | Attending: Emergency Medicine | Admitting: Emergency Medicine

## 2018-07-21 DIAGNOSIS — F1210 Cannabis abuse, uncomplicated: Secondary | ICD-10-CM | POA: Insufficient documentation

## 2018-07-21 DIAGNOSIS — R569 Unspecified convulsions: Secondary | ICD-10-CM

## 2018-07-21 DIAGNOSIS — J45909 Unspecified asthma, uncomplicated: Secondary | ICD-10-CM | POA: Insufficient documentation

## 2018-07-21 DIAGNOSIS — F17210 Nicotine dependence, cigarettes, uncomplicated: Secondary | ICD-10-CM | POA: Diagnosis not present

## 2018-07-21 DIAGNOSIS — F1410 Cocaine abuse, uncomplicated: Secondary | ICD-10-CM | POA: Insufficient documentation

## 2018-07-21 LAB — CBC
HEMATOCRIT: 37.5 % (ref 36.0–46.0)
HEMOGLOBIN: 12.6 g/dL (ref 12.0–15.0)
MCH: 27.5 pg (ref 26.0–34.0)
MCHC: 33.6 g/dL (ref 30.0–36.0)
MCV: 81.9 fL (ref 80.0–100.0)
Platelets: 323 10*3/uL (ref 150–400)
RBC: 4.58 MIL/uL (ref 3.87–5.11)
RDW: 12.7 % (ref 11.5–15.5)
WBC: 5.5 10*3/uL (ref 4.0–10.5)
nRBC: 0 % (ref 0.0–0.2)

## 2018-07-21 LAB — BASIC METABOLIC PANEL
Anion gap: 6 (ref 5–15)
BUN: 12 mg/dL (ref 6–20)
CHLORIDE: 106 mmol/L (ref 98–111)
CO2: 25 mmol/L (ref 22–32)
CREATININE: 0.94 mg/dL (ref 0.44–1.00)
Calcium: 8.9 mg/dL (ref 8.9–10.3)
GFR calc Af Amer: >60 mL/min (ref >60)
GFR calc non Af Amer: >60 mL/min (ref >60)
GLUCOSE: 74 mg/dL (ref 70–99)
Potassium: 3.7 mmol/L (ref 3.5–5.1)
Sodium: 137 mmol/L (ref 135–145)

## 2018-07-21 MED ORDER — ACETAMINOPHEN 325 MG PO TABS: 650.0000 mg | ORAL_TABLET | Freq: Once | ORAL | Status: DC

## 2018-07-21 MED ORDER — LEVETIRACETAM 500 MG PO TABS: 500.0000 mg | ORAL_TABLET | Freq: Two times a day (BID) | ORAL | 1 refills | Status: DC

## 2018-07-21 MED ORDER — IBUPROFEN 600 MG PO TABS: 600.0000 mg | ORAL_TABLET | ORAL | Status: DC

## 2018-07-21 NOTE — ED Triage Notes (Signed)
Pt arrived via EMS from home d/t concerns of seizure activity. Pt reports she was going to pick her daughter up out of crib, her dad told her to put her back down d/t her being unsteady on her feet. Pt states she then turned around and that was the last thing she remembers. Pt reports parents stating that she then face planted on the floor. Pt was alert upon EMS arrival. Pt is A&O x at this time with c/o of facial and jaw pain.

## 2018-07-21 NOTE — Discharge Instructions (Signed)
You have been seen in the emergency department today for a likely seizure.  Your workup today including labs are within normal limits.  Please follow up with your doctor as soon as possible regarding today's emergency department visit and your likely seizure.  You will also need to follow up with a neurologist as soon as possible, please call for appointment.  If you have been prescribed a medication for your seizures, please take this medication as prescribed.  As we have discussed it is very important that you DO NOT drive until you have been seen and cleared by your neurologist.   Return to the emergency department if you have any further seizures, develop any weakness/numbness of any arm/leg, confusion, slurred speech, or sudden/severe headache.

## 2018-07-21 NOTE — ED Provider Notes (Signed)
[facility] Emergency Department Provider Note   ____________________________________________   First MD Initiated Contact with Patient 07/21/18 [phone]     (approximate)  I have reviewed the triage vital signs and the nursing notes.   HISTORY  Chief Complaint Seizures    HPI Colleen Pearson is a 28 y.o. female here for evaluation after possible seizure  Patient is here with her father-in-law who witnessed the event.  Reports she was getting ready to get her daughter about her trip, he was with her and she began to seem a little off, almost a little bit "wobbly" and then she fell forward and she was witnessed to have shaking activity for at least several seconds was foaming at the mouth and had bit her tongue slightly  Patient reports that she woke up on the floor, was a little disoriented and had a headache, but now knows what she is doing   Past Medical History:  Diagnosis Date  . Asthma   . Endometriosis   . Ovarian cyst     Patient Active Problem List   Diagnosis Date Noted  . Cocaine use complicating pregnancy in third trimester 08/30/2017  . Status post cesarean section 08/30/2017  . Indication for care in labor and delivery, antepartum 08/29/2017  . Labor and delivery, indication for care 08/29/2017  . Family history of Down syndrome   . Decreased fetal movement 07/31/2017    Past Surgical History:  Procedure Laterality Date  . CESAREAN SECTION N/A 08/30/2017   Procedure: CESAREAN SECTION;  Surgeon: Conard NovakJackson, Stephen D, MD;  Location: [facility] ORS;  Service: Obstetrics;  Laterality: N/A;  . LASER ABLATION/CAUTERIZATION OF ENDOMETRIAL IMPLANTS      Prior to Admission medications   Medication Sig Start Date End Date Taking? Authorizing Provider  Aspirin-Acetaminophen-Caffeine (GOODY HEADACHE PO) Take 1 Package by mouth as needed.   Yes [provider]  etonogestrel (NEXPLANON) 68 MG IMPL implant 1 each (68 mg total) by Subdermal  route once for 1 dose. 10/19/17 07/21/18 Yes Conard NovakJackson, Stephen D, MD  levETIRAcetam (KEPPRA) 500 MG tablet Take 1 tablet (500 mg total) by mouth 2 (two) times daily. 07/21/18   Sharyn CreamerQuale, Bronislaw Switzer, MD    Allergies Lamotrigine; Penicillins; and Tramadol  History reviewed. No pertinent family history.  Social History Social History   Tobacco Use  . Smoking status: Current Every Day Smoker    Packs/day: 0.25    Types: Cigarettes  . Smokeless tobacco: Never Used  Substance Use Topics  . Alcohol use: No  . Drug use: Yes    Types: Marijuana, Cocaine  Patient denies that she has an ongoing history of drug use.  Occasional marijuana, No cocaine  Review of Systems Constitutional: No fever/chills normal health.  Had breakfast, watching TV this morning. Eyes: No visual changes. ENT: No sore throat.  Felt like she had a little bit of blood in her mouth afterwards, but does not feel a cut on the tongue now but she may have had normal earlier Cardiovascular: Denies chest pain. Respiratory: Denies shortness of breath. Gastrointestinal: No abdominal pain.  Denies pregnancy. Genitourinary: Negative for dysuria. Musculoskeletal: Negative for back pain. Skin: Negative for rash. Neurological: Negative for  areas of focal weakness or numbness.  Seizures see HPI    ____________________________________________   PHYSICAL EXAM:  VITAL SIGNS: ED Triage Vitals  Enc Vitals Group     BP 07/21/18 0841 116/62     Pulse Rate 07/21/18 0841 82     Resp 07/21/18  0841 13     Temp 07/21/18 0841 97.9 F (36.6 C)     Temp Source 07/21/18 0841 Oral     SpO2 07/21/18 0841 98 %     Weight 07/21/18 0842 130 lb (59 kg)     Height 07/21/18 0842 5' (1.524 m)     Head Circumference --      Peak Flow --      Pain Score 07/21/18 0842 8     Pain Loc --      Pain Edu? --      Excl. in GC? --     Constitutional: Alert and oriented. Well appearing and in no acute distress. Eyes: Conjunctivae are normal. Head:  Atraumatic.  Reports some slight tenderness over the right side of the forehead but no injury.  Normal jaw opening and closure.  Normal dental occlusion. Nose: No congestion/rhinnorhea. Mouth/Throat: Mucous membranes are moist.  No tongue lacerations noted. Neck: No stridor.  Cardiovascular: Normal rate, regular rhythm. Grossly normal heart sounds.  Good peripheral circulation. Respiratory: Normal respiratory effort.  No retractions. Lungs CTAB. Gastrointestinal: Soft and nontender. No distention. Musculoskeletal: No lower extremity tenderness nor edema. Neurologic:  Normal speech and language. No gross focal neurologic deficits are appreciated.  Skin:  Skin is warm, dry and intact. No rash noted. Psychiatric: Mood and affect are normal. Speech and behavior are normal.  ____________________________________________   LABS (all labs ordered are listed, but only abnormal results are displayed)  Labs Reviewed  CBC  BASIC METABOLIC PANEL   ____________________________________________  EKG  Reviewed entered by me at 842 Heart rate 80 QRS 90 QTc 400 Normal sinus rhythm no evidence ischemia ____________________________________________  RADIOLOGY  No results found.  Reviewed previous imaging including patient's previous MRI brain wo contrast. Discussed with Dr. Thad Rangereynolds as well.  ____________________________________________   PROCEDURES  Procedure(s) performed: None  Procedures  Critical Care performed: No  ____________________________________________   INITIAL IMPRESSION / ASSESSMENT AND PLAN / ED COURSE  Pertinent labs & imaging results that were available during my care of the patient were reviewed by me and considered in my medical decision making (see chart for details).   Patient presents for possible seizure-like episode.  Currently appears well, no acute distress.  Reports just slight headache over the right side.  No nausea vomiting.  No neurologic deficits.   Symptoms suggest probably seizure versus potential orthostatic type syncope.  Denies any cardiac or pulmonary symptoms.  Resting comfortably at this time.  Reports prior seizures as well, follow-up was years ago and has not had a seizure for over 2 years.  Clinical Course as of Jul 21 1126  Thu Jul 21, 2018  1025 Dr. Thad Rangereynolds recommends Keppra 500 mg BID if orthostatics normal.  Dr. Thad Rangereynolds advised the patient will need ongoing outpatient work-up first potential seizure.   [MQ]    Clinical Course User Index [MQ] Sharyn CreamerQuale, Misk Galentine, MD   Patient comfortable with plan for discharge.  She does not drive or work in dangerous environments.  She is comfortable with plan to go home, will start Keppra and set up follow-up with neurology for further outpatient work-up.  She does not breast-feed.  Denies pregnancy.  Return precautions and treatment recommendations and follow-up discussed with the patient who is agreeable with the plan.    ____________________________________________   FINAL CLINICAL IMPRESSION(S) / ED DIAGNOSES  Final diagnoses:  Seizure-like activity (HCC)        Note:  This document was prepared using Dragon voice recognition  software and may include unintentional dictation errors       Sharyn Creamer, MD 07/21/18 1128

## 2018-11-05 IMAGING — US US MFM OB FOLLOW-UP
1 series · 12 of 28 positions shown · non-contrast
Comparison: none

PATIENT INFO:

PERFORMED BY:
RTN PA
SERVICE(S) PROVIDED:
INDICATIONS:
36 weeks gestation of pregnancy
Polysubstamce abuse
Late entry to care
FETAL EVALUATION:
Num Of Fetuses:     1
Fetal Heart         128
Rate(bpm):
Presentation:       Cephalic
Placenta:           Anterior
AFI Sum(cm)     %Tile       Largest Pocket(cm)
3.7             < 3
RUQ(cm)       RLQ(cm)       LUQ(cm)        LLQ(cm)
1.96          0.98          0.76           0
BIOMETRY:
BPD:      86.9  mm     G. Age:  35w 0d         26  %    CI:        85.72   %    70 - 86
FL/HC:      22.6   %    20.1 -
HC:      295.6  mm     G. Age:  32w 5d        < 3  %    HC/AC:      0.93        0.93 -
AC:      318.6  mm     G. Age:  35w 5d         47  %    FL/BPD:     76.8   %    71 - 87
FL:       66.7  mm     G. Age:  34w 2d          8  %    FL/AC:      20.9   %    20 - 24
HUM:        56  mm     G. Age:  32w 4d        < 5  %
Est. FW:    5593  gm    5 lb 10 oz      25  %
GESTATIONAL AGE:
LMP:           27w 0d        Date:  02/22/17                 EDD:   11/29/17
U/S Today:     34w 3d                                        EDD:   10/08/17
Best:          36w 2d     Det. By:  Previous Ultrasound      EDD:   09/25/17
(08/02/17)
ANATOMY:
Cavum:                 Visualized             Stomach:                Seen
previously
Ventricles:            Normal appearance      Abdominal Wall:         Visualized
Cerebellum:            Visualized             Cord Vessels:           3 vessels,
previously                                     visualized previously
Posterior Fossa:       Visualized             Kidneys:                Normal appearance
Face:                  Orbits visualized      Bladder:                Seen
Lips:                  Visualized             Spine:                  Visualized
previously                                     previously
Heart:                 4-Chamber view         Upper Extremities:      Visualized
appears normal                                 previously
RVOT:                  Normal appearance      Lower Extremities:      Visualized
LVOT:                  Normal appearance

[Series 1: us mfm ob follow-up · 0.26mm/px · 12 of 55 slices shown]
[im 3/55]
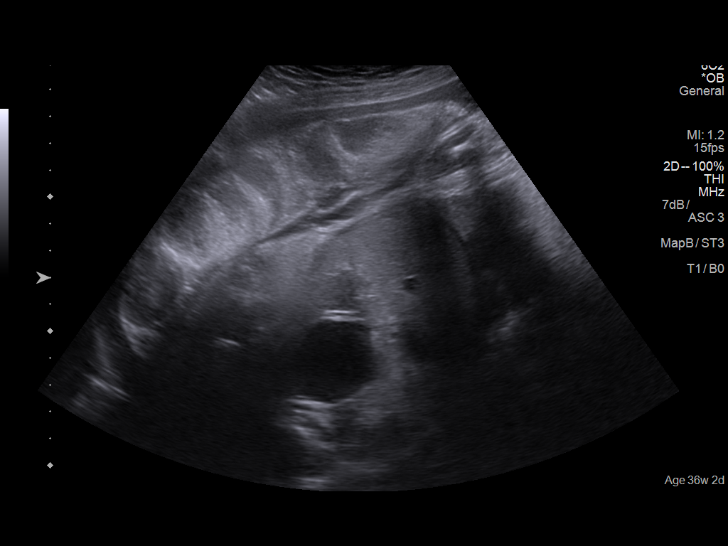
[im 7/55]
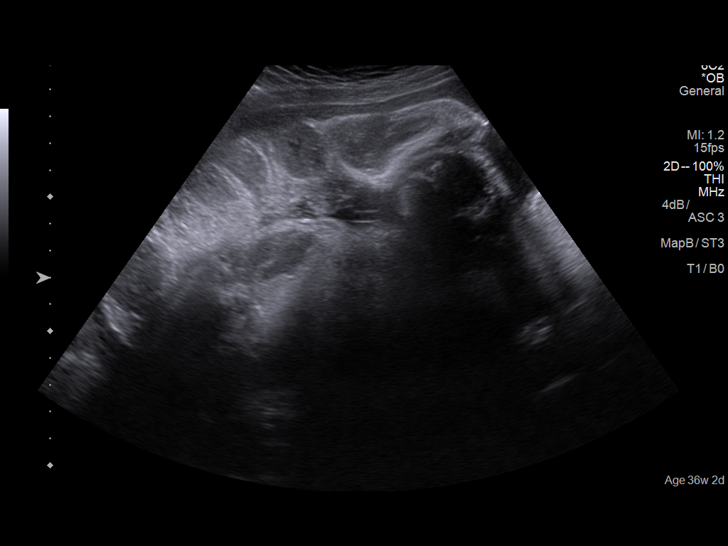
[im 11/55]
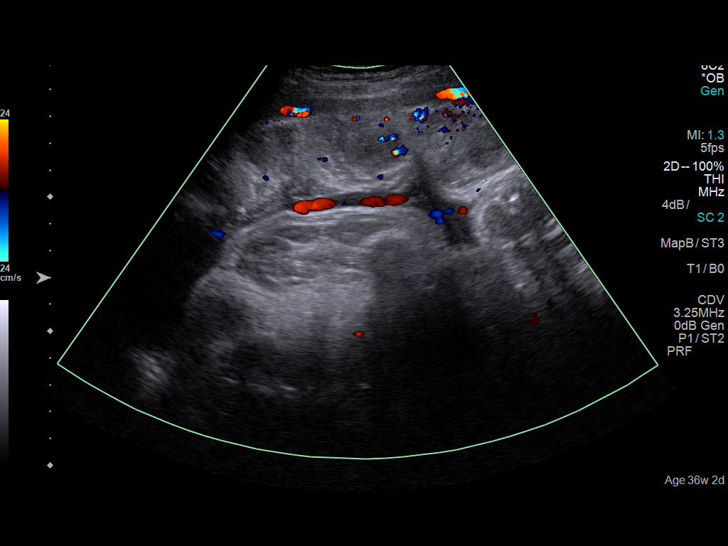
[im 17/55]
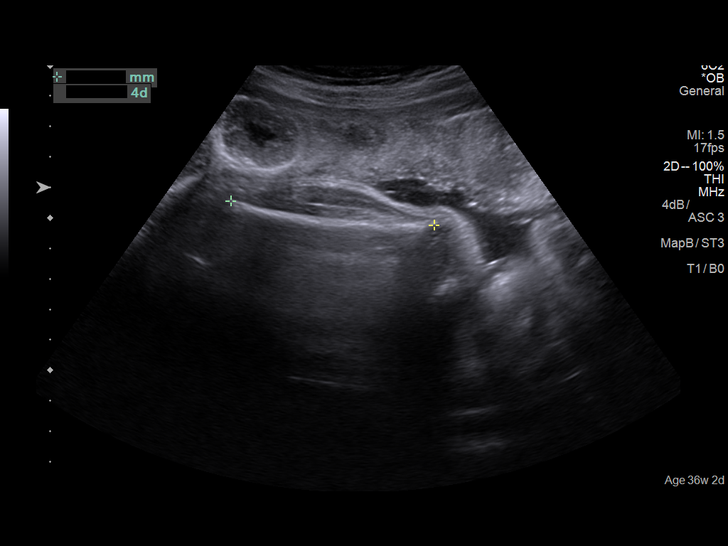
[im 21/55]
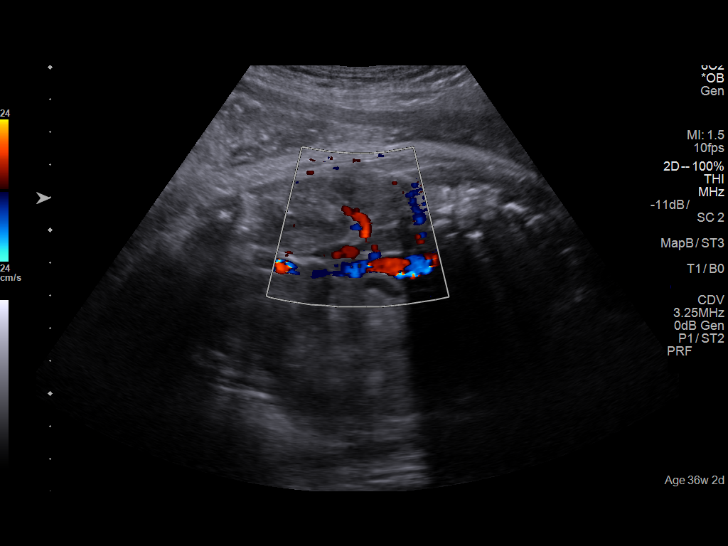
[im 25/55]
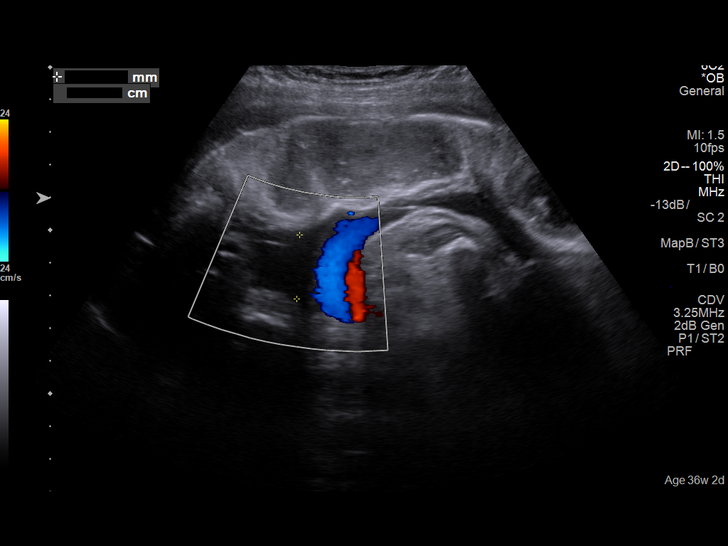
[im 31/55]
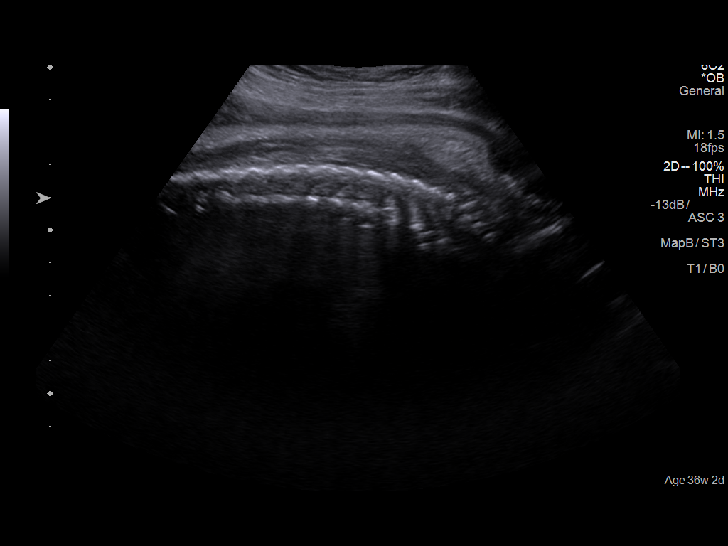
[im 35/55]
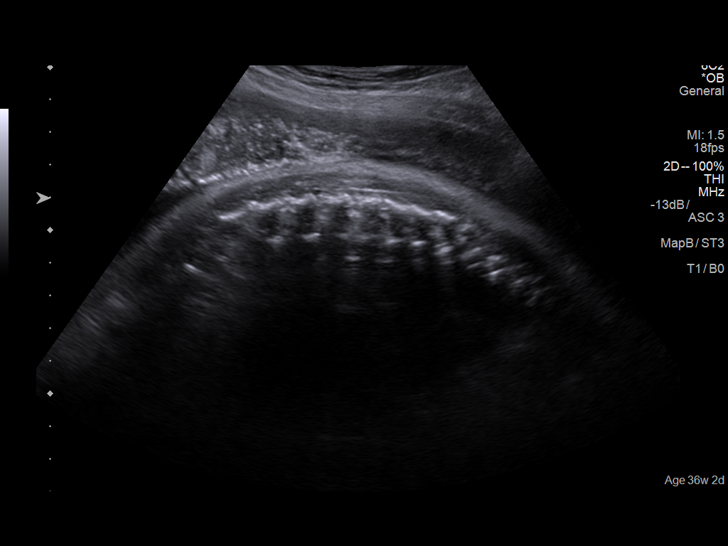
[im 39/55]
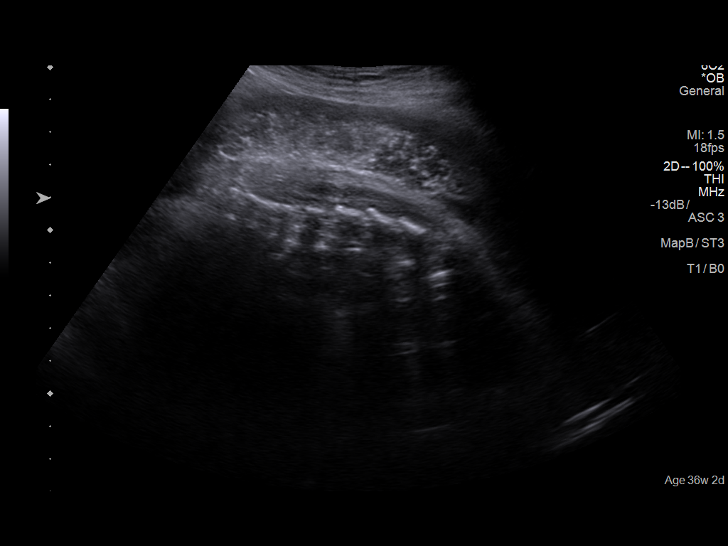
[im 45/55]
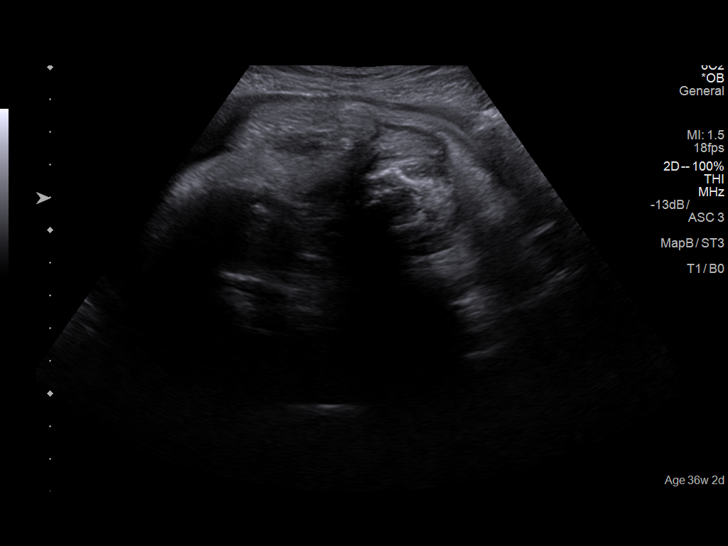
[im 49/55]
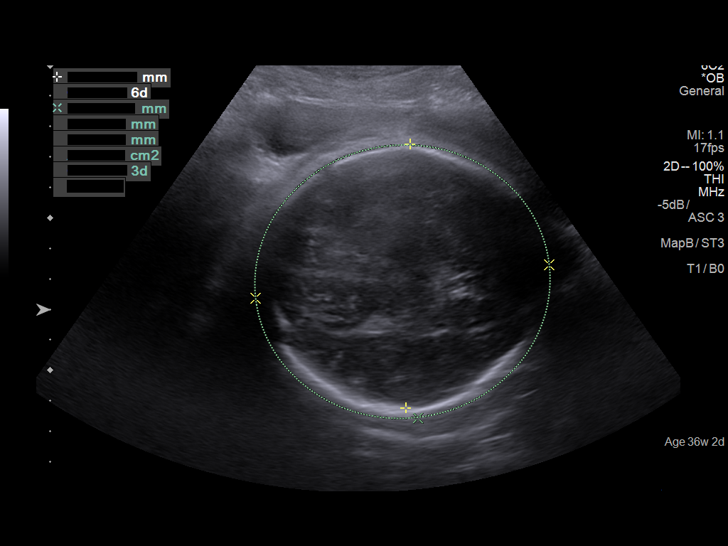
[im 53/55]
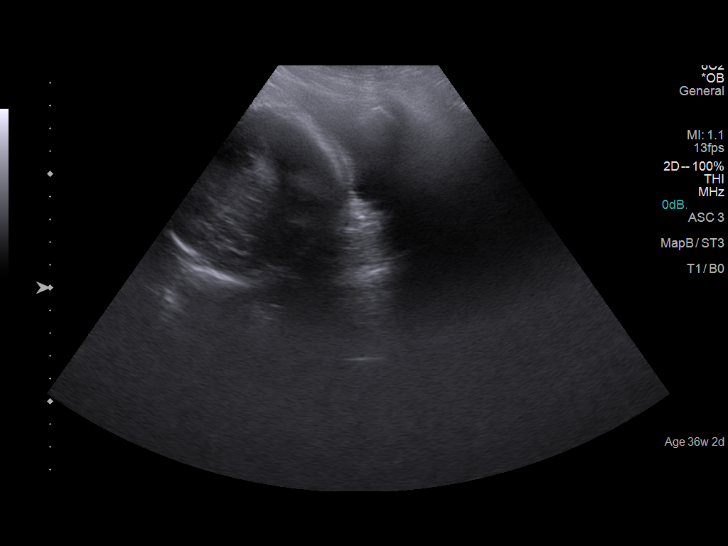

[12 of 28 positions shown; findings below may reference images not displayed]

IMPRESSION: Dear Dr. RTN,

Thank you for referring your patient to [facility] for a
fetal growth evaluation.

There is a singleton gestation with oligohydramnios with AFI
= 3.7. Scan by me revealed a >3x3cm pocket of amniotic fluid
(
3x3).

The fetal biometry correlates with established dating.
Adequate interval growth noted.
The estimated fetal weight is at the 25   percentile. MARJOLEIN.

MFM consultation:
The patient presented with contractions and was found to be
[DATE]. She has ongoing polysubstance abuse. Exam today
reveals adequate fetal growth and size but oligohydramnios
by AFI but with a 2x2 pocket of fluid present.
I have the following recommendations:
1. Consider re-rule out rupture of membranes. The patient
initially reported leaking fluid but then denied it.
2.Continue in-house monitoring for one week (until 37 weeks).
3. Agree with Sokol administration.
4. If develops early labor, augmentation would be reasonable.
5. If no labor recommend continue in-house with repeat AFI
at 29w4d. If oligohyrdramnios beyond 37 weeks  or if no 2x2
pocket at any time, recommend IOL.
6. If oligohydramnios resolves would await 39 weeks
gestation for delivery.

Thank you for allowing us to participate in your patient's care.
assistance.

## 2019-08-07 NOTE — Telephone Encounter (Signed)
Nexplanon Rcvd/charged 10/19/17

## 2020-10-29 ENCOUNTER — Ambulatory Visit: Payer: Medicaid Other | Admitting: Obstetrics and Gynecology

## 2020-11-04 ENCOUNTER — Encounter: Payer: Self-pay | Admitting: Obstetrics

## 2020-11-04 ENCOUNTER — Ambulatory Visit (INDEPENDENT_AMBULATORY_CARE_PROVIDER_SITE_OTHER): Payer: Medicaid Other | Admitting: Obstetrics

## 2020-11-04 ENCOUNTER — Other Ambulatory Visit: Payer: Self-pay

## 2020-11-04 VITALS — BP 118/70 | Ht 60.0 in | Wt 131.4 lb

## 2020-11-04 DIAGNOSIS — Z975 Presence of (intrauterine) contraceptive device: Secondary | ICD-10-CM

## 2020-11-04 DIAGNOSIS — Z3040 Encounter for surveillance of contraceptives, unspecified: Secondary | ICD-10-CM

## 2020-11-04 DIAGNOSIS — Z30017 Encounter for initial prescription of implantable subdermal contraceptive: Secondary | ICD-10-CM

## 2020-11-04 DIAGNOSIS — Z3046 Encounter for surveillance of implantable subdermal contraceptive: Secondary | ICD-10-CM | POA: Diagnosis not present

## 2020-11-04 HISTORY — DX: Presence of (intrauterine) contraceptive device: Z97.5

## 2020-11-04 LAB — POCT URINE PREGNANCY: Preg Test, Ur: NEGATIVE

## 2020-11-04 MED ORDER — NEXPLANON 68 MG ~~LOC~~ IMPL: 1.0000 | DRUG_IMPLANT | Freq: Once | SUBCUTANEOUS | 0 refills | Status: DC

## 2020-11-04 NOTE — Progress Notes (Signed)
GYNECOLOGY PROCEDURE NOTE  Implanon removal discussed in detail.  Risks of infection, bleeding, nerve injury all reviewed.  Patient understands risks and desires to proceed.  Verbal consent obtained.  Patient is certain she wants the implanon removed.  All questions answered.  Procedure: Patient placed in dorsal supine with left arm above head, elbow flexed at 90 degrees, arm resting on examination table.  Implanon identified without problems.  Betadine scrub x3.  1 ml of 1% lidocaine injected under implanon device without problems.  Sterile gloves applied.  Small 0.5cm incision made at distal tip of implanon device with 11 blade scalpel.  Implanon brought to incision and grasped with a small kelly clamp.  Implanon removed intact without problems.  Pressure applied to incision.  Hemostasis obtained.  Steri-strips applied, followed by bandage and compression dressing.  Patient tolerated procedure well.  No complications.   Assessment: 30 y.o. year old female now s/p uncomplicated implanon removal. She plasn oninsettion of antoher Nexplanon today.  Plan: 1.  Patient given post procedure precautions and asked to call for fever, chills, redness or drainage from her incision, bleeding from incision.  She understands she will likely have a small bruise near site of removal and can remove bandage tomorrow and steri-strips in approximately 1 week.  2) Contraception insertion of anothr Nexplanon.  J2001 for lidocaine block, [id number] for nexplanon removal     GYNECOLOGY PROCEDURE NOTE  Patient is a 30 y.o. [id number] presenting for Nexplanon insertion as her desires means of contraception.  She provided informed consent, signed copy in the chart, time out was performed. Pregnancy test was not done as she had Nexplanon in place already, with self reported LMP of Patient's last menstrual period was 10/14/2020.  She understands that Nexplanon is a progesterone only therapy, and that patients often patients  have irregular and unpredictable vaginal bleeding or amenorrhea. She understands that other side effects are possible related to systemic progesterone, including but not limited to, headaches, breast tenderness, nausea, and irritability. While effective at preventing pregnancy long acting reversible contraceptives do not prevent transmission of sexually transmitted diseases and use of barrier methods for this purpose was discussed. The placement procedure for Nexplanon was reviewed with the patient in detail including risks of nerve injury, infection, bleeding and injury to other muscles or tendons. She understands that the Nexplanon implant is good for 3 years and needs to be removed at the end of that time.  She understands that Nexplanon is an extremely effective option for contraception, with failure rate of <1%. This information is reviewed today and all questions were answered. Informed consent was obtained, both verbally and written.   The patient is healthy and has no contraindications to Implanon use. Urine pregnancy test was performed today and was negative.  Procedure Appropriate time out taken.  Patient placed in dorsal supine with her left arm above head, elbow flexed at 90 degrees, arm resting on examination table.  The bicipital grove was palpated and site 8-10cm proximal to the medial epicondyle was indentified . The insertion site was prepped with a two betadine swabs and then injected with 1cc of 1% lidocaine without epinephrine.  Nexplanon removed form sterile blister packaging,  Device confirmed in needle, before inserting full length of needle, tenting up the skin as the needle was advance.  The drug eluting rod was then deployed by pulling back the slider per the manufactures recommendation.  The implant was palpable by the clinician as well as the patient.  The insertion  site covered dressed with a band aid before applying  a kerlex bandage pressure dressing..Minimal blood loss was noted  during the procedure.  The patientt tolerated the procedure well.   She was instructed to wear the bandage for 24 hours, call with any signs of infection.  She was given the Implanon card and instructed to have the rod removed in 3 years.  Charge [phone] for nexplanon device, CPT [id number] for procedure J2001 for lidocaine administration Modifer 25, plus Modifer 79 is done during a global billing visit   [name], CNM  11/04/2020 12:28 PM

## 2020-11-11 ENCOUNTER — Ambulatory Visit: Payer: Medicaid Other | Admitting: Obstetrics and Gynecology

## 2020-11-24 ENCOUNTER — Other Ambulatory Visit: Payer: Self-pay

## 2020-11-24 ENCOUNTER — Emergency Department
Admission: EM | Admit: 2020-11-24 | Discharge: 2020-11-24 | Disposition: A | Discharge status: Home / Self Care | Payer: Medicaid Other | Attending: Emergency Medicine | Admitting: Emergency Medicine

## 2020-11-24 ENCOUNTER — Emergency Department: Payer: Medicaid Other

## 2020-11-24 DIAGNOSIS — Z7982 Long term (current) use of aspirin: Secondary | ICD-10-CM | POA: Insufficient documentation

## 2020-11-24 DIAGNOSIS — J45909 Unspecified asthma, uncomplicated: Secondary | ICD-10-CM | POA: Insufficient documentation

## 2020-11-24 DIAGNOSIS — R569 Unspecified convulsions: Secondary | ICD-10-CM

## 2020-11-24 DIAGNOSIS — G40909 Epilepsy, unspecified, not intractable, without status epilepticus: Secondary | ICD-10-CM | POA: Insufficient documentation

## 2020-11-24 DIAGNOSIS — F17210 Nicotine dependence, cigarettes, uncomplicated: Secondary | ICD-10-CM | POA: Insufficient documentation

## 2020-11-24 LAB — ETHANOL: Alcohol, Ethyl (B): <10 mg/dL (ref <10)

## 2020-11-24 LAB — COMPREHENSIVE METABOLIC PANEL
ALT: 24 U/L (ref 0–44)
AST: 31 U/L (ref 15–41)
Albumin: 3.9 g/dL (ref 3.5–5.0)
Alkaline Phosphatase: 70 U/L (ref 38–126)
Anion gap: 9 (ref 5–15)
BUN: 11 mg/dL (ref 6–20)
CO2: 22 mmol/L (ref 22–32)
Calcium: 8.7 mg/dL — ABNORMAL LOW (ref 8.9–10.3)
Chloride: 108 mmol/L (ref 98–111)
Creatinine, Ser: 0.8 mg/dL (ref 0.44–1.00)
GFR, Estimated: >60 mL/min (ref >60)
Glucose, Bld: 123 mg/dL — ABNORMAL HIGH (ref 70–99)
Potassium: 3.4 mmol/L — ABNORMAL LOW (ref 3.5–5.1)
Sodium: 139 mmol/L (ref 135–145)
Total Bilirubin: 0.6 mg/dL (ref 0.3–1.2)
Total Protein: 7 g/dL (ref 6.5–8.1)

## 2020-11-24 LAB — CBC WITH DIFFERENTIAL/PLATELET
Abs Immature Granulocytes: 0.01 10*3/uL (ref 0.00–0.07)
Basophils Absolute: 0 10*3/uL (ref 0.0–0.1)
Basophils Relative: 0 %
Eosinophils Absolute: 0.2 10*3/uL (ref 0.0–0.5)
Eosinophils Relative: 4 %
HCT: 30.6 % — ABNORMAL LOW (ref 36.0–46.0)
Hemoglobin: 10.4 g/dL — ABNORMAL LOW (ref 12.0–15.0)
Immature Granulocytes: 0 %
Lymphocytes Relative: 29 %
Lymphs Abs: 1.4 10*3/uL (ref 0.7–4.0)
MCH: 27.2 pg (ref 26.0–34.0)
MCHC: 34 g/dL (ref 30.0–36.0)
MCV: 80.1 fL (ref 80.0–100.0)
Monocytes Absolute: 0.5 10*3/uL (ref 0.1–1.0)
Monocytes Relative: 11 %
Neutro Abs: 2.6 10*3/uL (ref 1.7–7.7)
Neutrophils Relative %: 56 %
Platelets: 271 10*3/uL (ref 150–400)
RBC: 3.82 MIL/uL — ABNORMAL LOW (ref 3.87–5.11)
RDW: 13.9 % (ref 11.5–15.5)
WBC: 4.6 10*3/uL (ref 4.0–10.5)
nRBC: 0 % (ref 0.0–0.2)

## 2020-11-24 MED ORDER — LORAZEPAM 2 MG/ML IJ SOLN
1.0000 mg | Freq: Once | INTRAMUSCULAR | Status: AC
  Administered 2020-11-24: 1 mg via INTRAVENOUS
  Filled 2020-11-24: qty 1

## 2020-11-24 MED ORDER — LEVETIRACETAM 500 MG PO TABS: 500.0000 mg | ORAL_TABLET | Freq: Two times a day (BID) | ORAL | 0 refills | Status: DC

## 2020-11-24 MED ORDER — LEVETIRACETAM IN NACL 500 MG/100ML IV SOLN
500.0000 mg | Freq: Once | INTRAVENOUS | Status: AC
  Administered 2020-11-24: 500 mg via INTRAVENOUS
  Filled 2020-11-24: qty 100

## 2020-11-24 MED ORDER — SODIUM CHLORIDE 0.9 % IV BOLUS
1000.0000 mL | Freq: Once | INTRAVENOUS | Status: AC
  Administered 2020-11-24: 1000 mL via INTRAVENOUS

## 2020-11-24 NOTE — ED Notes (Signed)
Patient transported to CT 

## 2020-11-24 NOTE — ED Provider Notes (Signed)
[facility] Emergency Department Provider Note   ____________________________________________   Event Date/Time   First MD Initiated Contact with Patient 11/24/20 0310     (approximate)  I have reviewed the triage vital signs and the nursing notes.   HISTORY  Chief Complaint Seizures (Brought in by ambulance, witnessed seizure at home.  Hx of seizure was 3-4 years ago, was on Ativan, but no longer has insurance so she does not receive medications.)    HPI Colleen Pearson is a 30 y.o. female brought to the ED via EMS from home with a chief complaint of seizure.  Patient reports sitting at rest and her family saying good night to her.  That was the last thing she remembers and next thing she recalls is EMS was at her house and she had a tonic-clonic seizure witnessed by her family.  Did not bite tongue or suffer urinary incontinence.  Patient has a history of seizure disorder and is supposed to be taking Keppra 500 mg twice daily but has not done so since 2020 due to financial issues.  Tells me Ativan has worked for her seizures in the past.  Admits to marijuana use 3 days ago.  Denies fever, cough, chest pain, shortness of breath, abdominal pain, nausea, vomiting or dizziness.  Denies EtOH use.  Denies fall/injury/trauma.     Past Medical History:  Diagnosis Date  . Asthma   . Cocaine use complicating pregnancy in third trimester 08/30/2017  . Endometriosis   . Ovarian cyst   . Status post cesarean section 08/30/2017    Patient Active Problem List   Diagnosis Date Noted  . Nexplanon in place 11/04/2020  . Family history of Down syndrome     Past Surgical History:  Procedure Laterality Date  . CESAREAN SECTION N/A 08/30/2017   Procedure: CESAREAN SECTION;  Surgeon: Conard Novak, MD;  Location: [facility] ORS;  Service: Obstetrics;  Laterality: N/A;  . LASER ABLATION/CAUTERIZATION OF ENDOMETRIAL IMPLANTS      Prior to Admission medications    Medication Sig Start Date End Date Taking? Authorizing Provider  levETIRAcetam (KEPPRA) 500 MG tablet Take 1 tablet (500 mg total) by mouth 2 (two) times daily. 11/24/20  Yes Irean Hong, MD  Aspirin-Acetaminophen-Caffeine (GOODY HEADACHE PO) Take 1 Package by mouth as needed. Patient not taking: Reported on 11/04/2020    [provider]  etonogestrel (NEXPLANON) 68 MG IMPL implant 1 each (68 mg total) by Subdermal route once for 1 dose. 11/04/20 11/04/20  [name], CNM    Allergies Lamotrigine, Penicillins, and Tramadol  No family history on file.  Social History Social History   Tobacco Use  . Smoking status: Current Every Day Smoker    Packs/day: 0.25    Types: Cigarettes  . Smokeless tobacco: Never Used  Substance Use Topics  . Alcohol use: No  . Drug use: Yes    Types: Marijuana, Cocaine    Review of Systems  Constitutional: No fever/chills Eyes: No visual changes. ENT: No sore throat. Cardiovascular: Denies chest pain. Respiratory: Denies shortness of breath. Gastrointestinal: No abdominal pain.  No nausea, no vomiting.  No diarrhea.  No constipation. Genitourinary: Negative for dysuria. Musculoskeletal: Negative for back pain. Skin: Negative for rash. Neurological: Positive for seizure.  Negative for headaches, focal weakness or numbness.   ____________________________________________   PHYSICAL EXAM:  VITAL SIGNS: ED Triage Vitals  Enc Vitals Group     BP      Pulse  Resp      Temp      Temp src      SpO2      Weight      Height      Head Circumference      Peak Flow      Pain Score      Pain Loc      Pain Edu?      Excl. in GC?     Constitutional: Alert and oriented. Well appearing and in no acute distress. Eyes: Conjunctivae are normal. PERRL. EOMI. Head: Atraumatic. Nose: No congestion/rhinnorhea. Mouth/Throat: Mucous membranes are moist.  No tongue laceration or abrasion.   Neck: No stridor.  No cervical spine  tenderness to palpation. Cardiovascular: Normal rate, regular rhythm. Grossly normal heart sounds.  Good peripheral circulation. Respiratory: Normal respiratory effort.  No retractions. Lungs CTAB. Gastrointestinal: Soft and nontender. No distention. No abdominal bruits. No CVA tenderness. Musculoskeletal: No lower extremity tenderness nor edema.  No joint effusions. Neurologic: Not postictal.  Alert and oriented x3.  CN II to XII grossly intact.  Normal speech and language. No gross focal neurologic deficits are appreciated.  Skin:  Skin is warm, dry and intact. No rash noted. Psychiatric: Mood and affect are normal. Speech and behavior are normal.  ____________________________________________   LABS (all labs ordered are listed, but only abnormal results are displayed)  Labs Reviewed  CBC WITH DIFFERENTIAL/PLATELET - Abnormal; Notable for the following components:      Result Value   RBC 3.82 (*)    Hemoglobin 10.4 (*)    HCT 30.6 (*)    All other components within normal limits  COMPREHENSIVE METABOLIC PANEL - Abnormal; Notable for the following components:   Potassium 3.4 (*)    Glucose, Bld 123 (*)    Calcium 8.7 (*)    All other components within normal limits  ETHANOL  URINE DRUG SCREEN, QUALITATIVE ([facility] ONLY)  POC URINE PREG, ED   ____________________________________________  EKG  ED ECG REPORT I, Falecia Vannatter J, the attending physician, personally viewed and interpreted this ECG.   Date: 11/24/2020  EKG Time: 0317  Rate: 62  Rhythm: normal EKG, normal sinus rhythm  Axis: Normal  Intervals:none  ST&T Change: Nonspecific  ____________________________________________  RADIOLOGY I, Adilee Lemme J, personally viewed and evaluated these images (plain radiographs) as part of my medical decision making, as well as reviewing the written report by the radiologist.  ED MD interpretation: No ICH  Official radiology report(s): CT Head Wo Contrast  Result Date:  11/24/2020 CLINICAL DATA:  Seizure.  Nontraumatic.  Mental status change. EXAM: CT HEAD WITHOUT CONTRAST TECHNIQUE: Contiguous axial images were obtained from the base of the skull through the vertex without intravenous contrast. COMPARISON:  CT head 02/12/2012 FINDINGS: Brain: Nonspecific bilateral basal ganglia mineralization. No evidence of large-territorial acute infarction. No parenchymal hemorrhage. No mass lesion. No extra-axial collection. No mass effect or midline shift. No hydrocephalus. Basilar cisterns are patent. Vascular: No hyperdense vessel. Skull: No acute fracture or focal lesion. Sinuses/Orbits: Paranasal sinuses and mastoid air cells are clear. The orbits are unremarkable. Other: None. IMPRESSION: No acute intracranial abnormality. Electronically Signed   By: Tish Frederickson M.D.   On: 11/24/2020 04:03    ____________________________________________   PROCEDURES  Procedure(s) performed (including Critical Care):  .1-3 Lead EKG Interpretation Performed by: Irean Hong, MD Authorized by: Irean Hong, MD     Interpretation: normal     ECG rate:  71   ECG rate assessment:  normal     Rhythm: sinus rhythm     Ectopy: none     Conduction: normal   Comments:     Patient placed on cardiac monitor to evaluate for arrthymias     ____________________________________________   INITIAL IMPRESSION / ASSESSMENT AND PLAN / ED COURSE  As part of my medical decision making, I reviewed the following data within the electronic MEDICAL RECORD NUMBER Nursing notes reviewed and incorporated, Labs reviewed, EKG interpreted, Old chart reviewed, Radiograph reviewed and Notes from prior ED visits     30 year old female who presents to the ED with seizure.  Differential diagnosis includes but is not limited to subtherapeutic Keppra level, EtOH, drug use, metabolic, infectious etiology, etc.  Will obtain basic lab work, UA.  CT head, administer IV Keppra and Ativan.  Will reassess.  Clinical  Course as of 11/24/20 1884  Wynelle Link Nov 24, 2020  0608 CT and lab work unremarkable.  Patient sleeping soundly after Ativan.  Will administer second liter IV fluids.  Anticipate discharge once patient is awake.  Will discharge home on Keppra 500 twice daily and follow-up with neurology. [JS]  0655 Remains asleep.  Anticipate discharge home once patient is awake.  Will discharge home on Keppra 500 mg twice daily and close follow-up with neurology. [JS]    Clinical Course User Index [JS] Irean Hong, MD     ____________________________________________   FINAL CLINICAL IMPRESSION(S) / ED DIAGNOSES  Final diagnoses:  Seizure [facility])     ED Discharge Orders         Ordered    levETIRAcetam (KEPPRA) 500 MG tablet  2 times daily        11/24/20 0406           Note:  This document was prepared using Dragon voice recognition software and may include unintentional dictation errors.   Irean Hong, MD 11/24/20 [phone]

## 2020-11-24 NOTE — ED Triage Notes (Signed)
AOx4 able to voice needs appropriately.  No acute distress.

## 2020-11-24 NOTE — ED Provider Notes (Signed)
On reassessment patient is awake and alert.  Discussed plan for restarting her on Keppra.  She is amenable to this.  Discharged stable condition.  Rx written for Keppra.   Gilles Chiquito, MD 11/24/20 [phone]

## 2020-11-24 NOTE — Discharge Instructions (Signed)
Restart Keppra 500mg  twice daily for seizures.  Do not drive or drink alcohol until cleared by the neurologist.  Return to the ER for worsening symptoms, persistent vomiting, difficulty breathing or other concerns.

## 2020-12-03 ENCOUNTER — Ambulatory Visit: Payer: Medicaid Other | Admitting: Obstetrics

## 2021-07-20 IMAGING — CT CT HEAD W/O CM
3 series · 15 of 46 positions shown, 18 images · non-contrast
Comparison: CT head 02/12/2012

CLINICAL DATA: Seizure.  Nontraumatic.  Mental status change.

EXAM:
CT HEAD WITHOUT CONTRAST
TECHNIQUE: Contiguous axial images were obtained from the base of the skull
through the vertex without intravenous contrast.

[Series 2: head wo · axial · 0.39mm/px · z∈[-163,-43]mm · 9 of 29 slices shown, 12 images]
[im 3/29  brain]
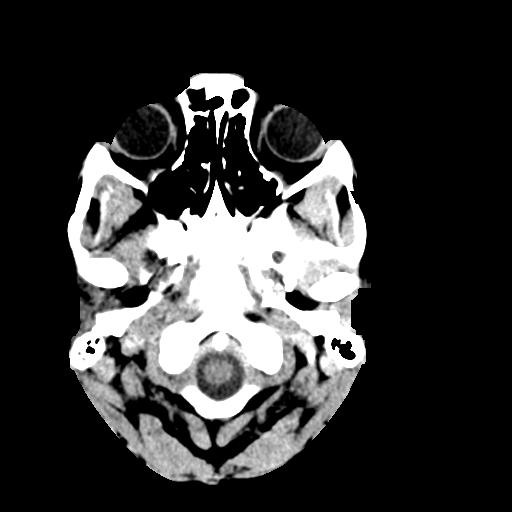
[im 3/29  bone]
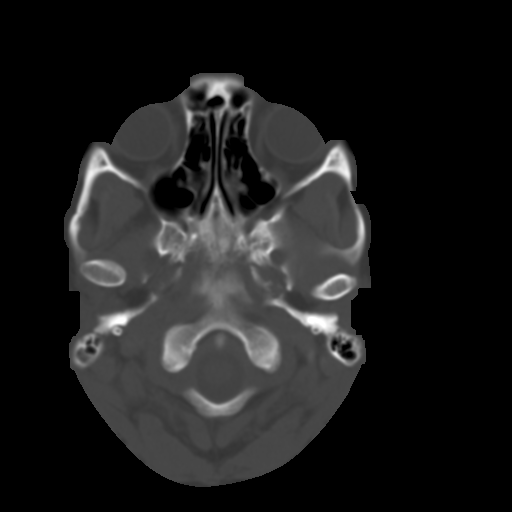
[im 6/29  brain]
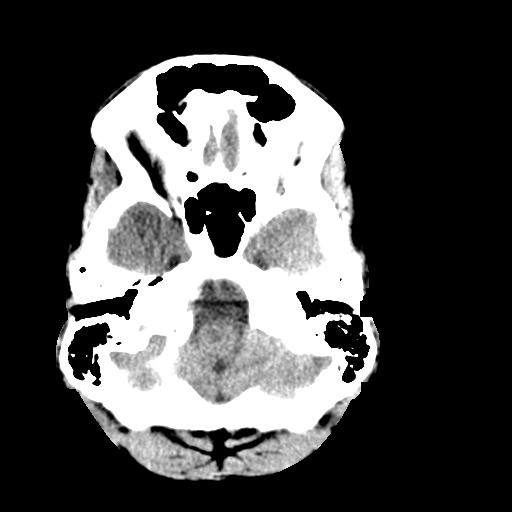
[im 9/29  brain]
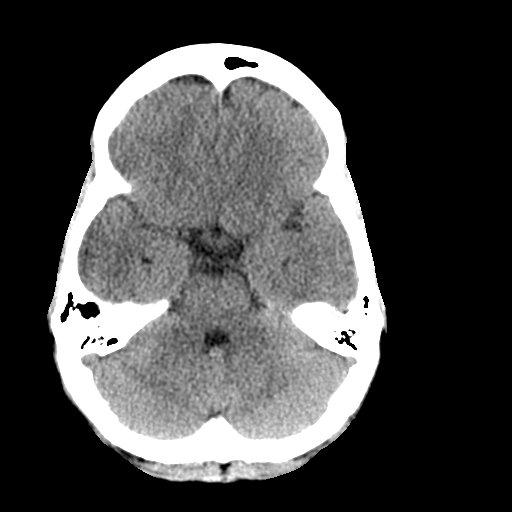
[im 12/29  brain]
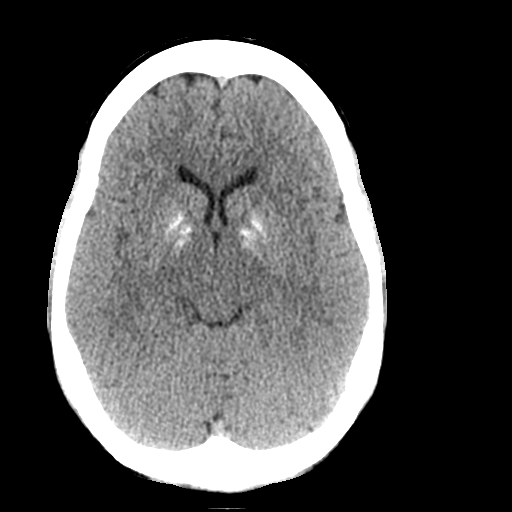
[im 15/29  brain]
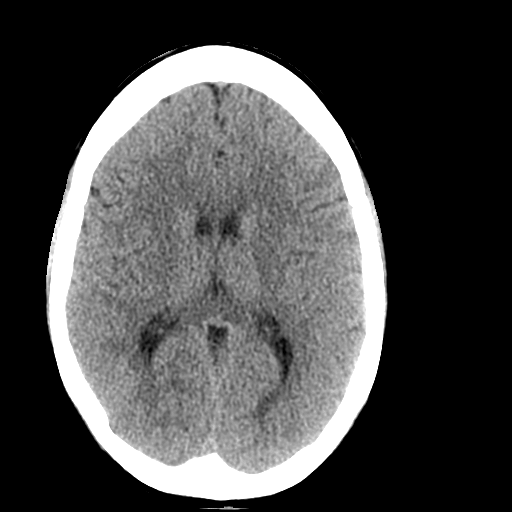
[im 15/29  bone]
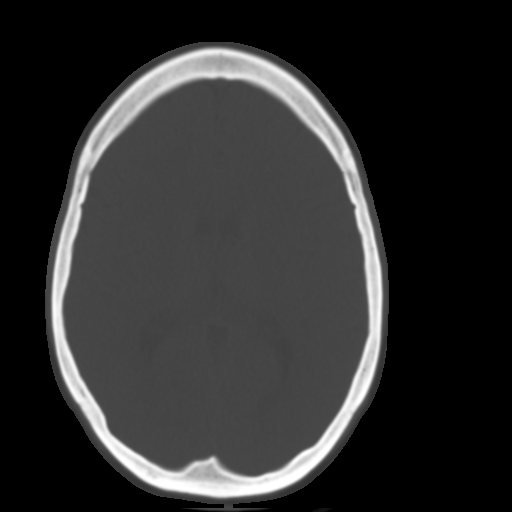
[im 18/29  brain]
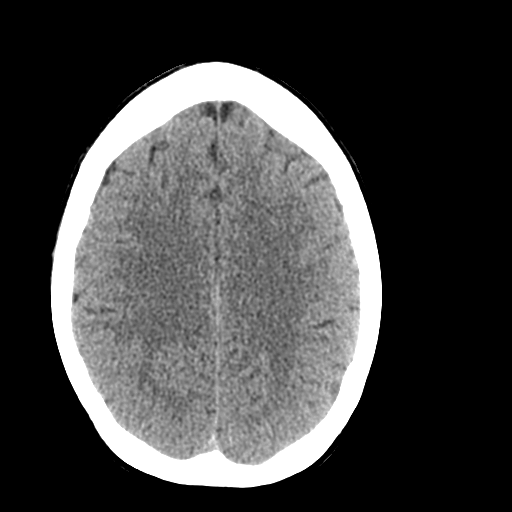
[im 21/29  brain]
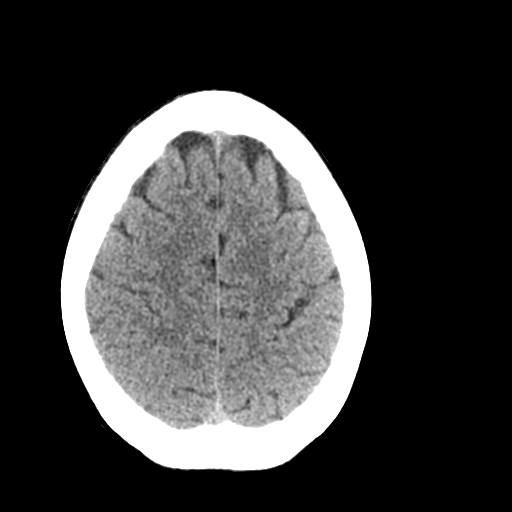
[im 24/29  brain]
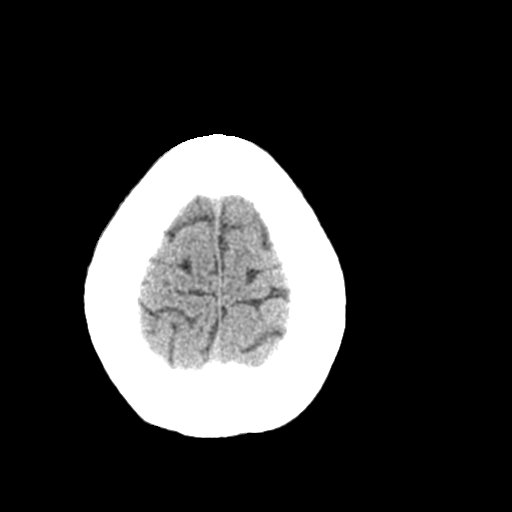
[im 27/29  brain]
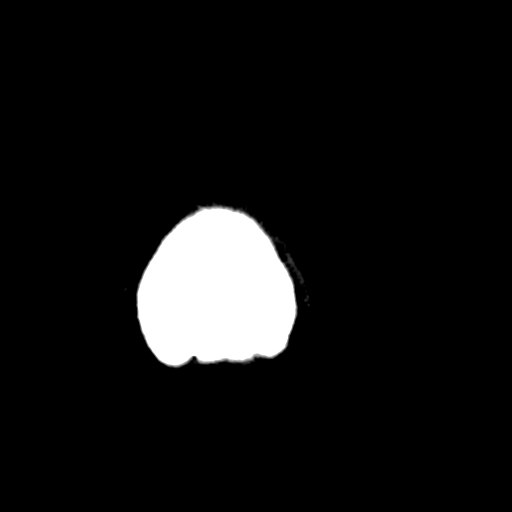
[im 27/29  bone]
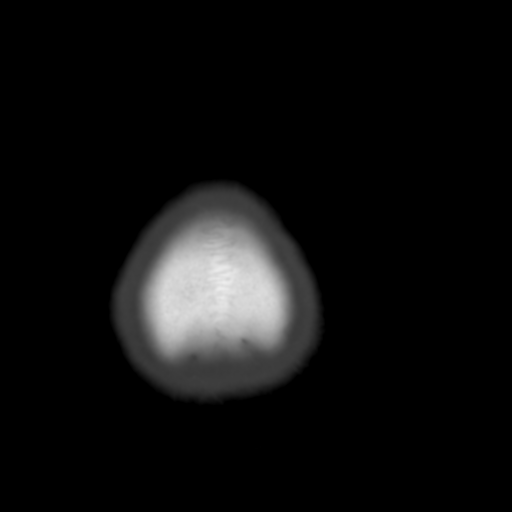

[Series 3: coronal soft tissue · coronal · 0.31mm/px · 3 of 67 slices shown]
[im 23/67  brain]
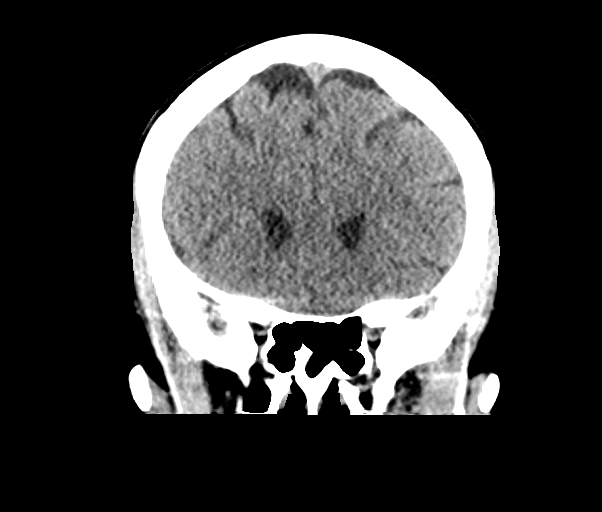
[im 30/67  brain]
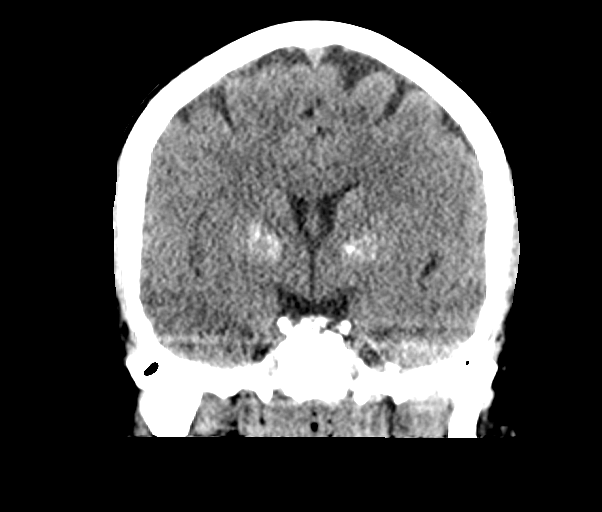
[im 37/67  brain]
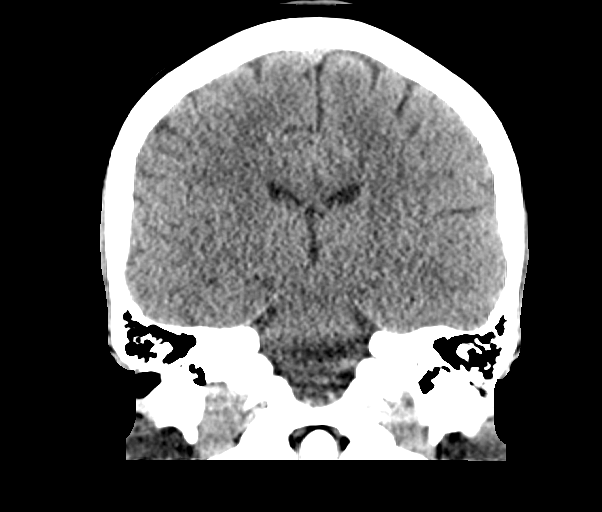

[Series 5: sagittal soft tissue · sagittal · 0.31mm/px · 3 of 59 slices shown]
[im 20/59  brain]
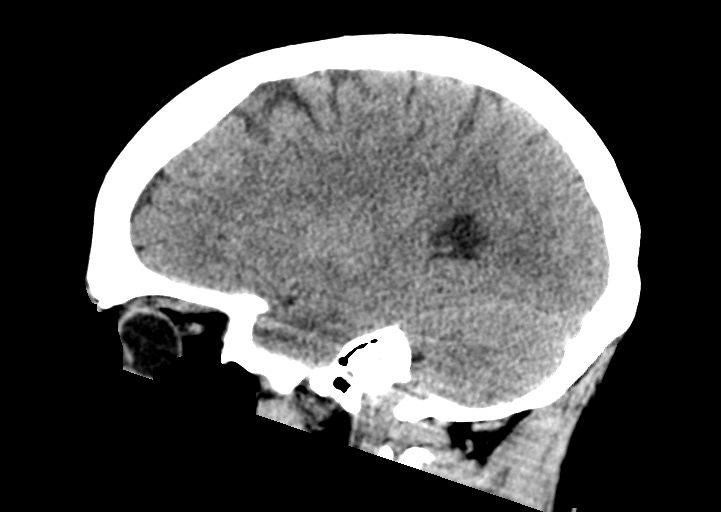
[im 30/59  brain]
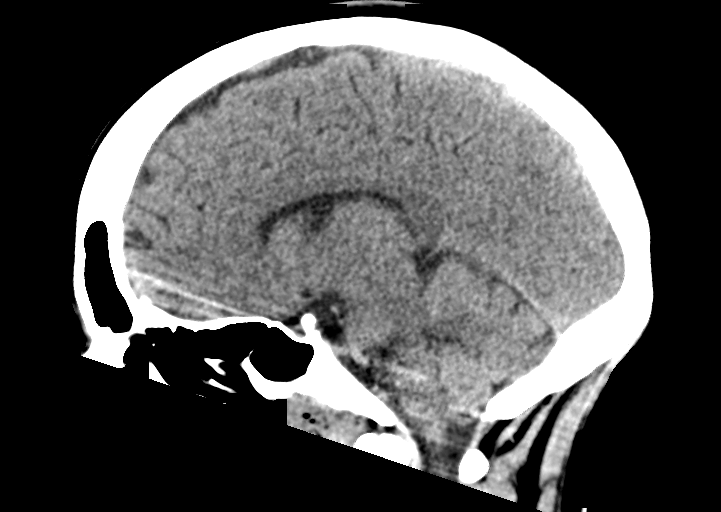
[im 39/59  brain]
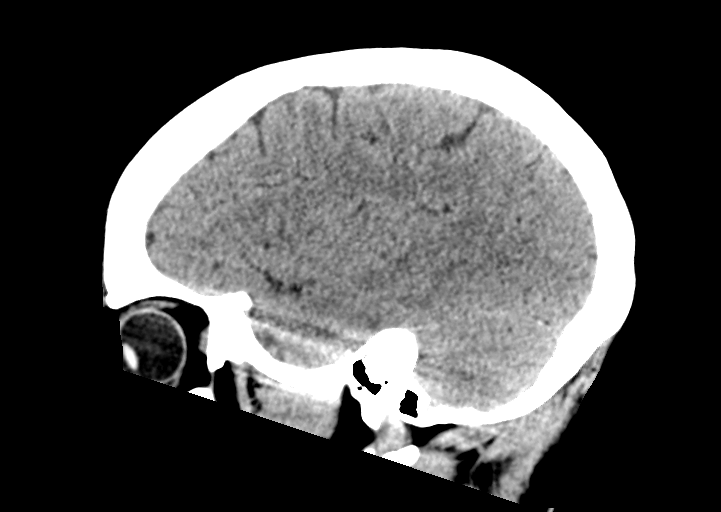

[15 of 46 positions shown; findings below may reference images not displayed]

FINDINGS: Brain:

Nonspecific bilateral basal ganglia mineralization.

No evidence of large-territorial acute infarction. No parenchymal
hemorrhage. No mass lesion. No extra-axial collection.

No mass effect or midline shift. No hydrocephalus. Basilar cisterns
are patent.

Vascular: No hyperdense vessel.

Skull: No acute fracture or focal lesion.

Sinuses/Orbits: Paranasal sinuses and mastoid air cells are clear.
The orbits are unremarkable.

Other: None.
IMPRESSION: No acute intracranial abnormality.

## 2021-08-21 ENCOUNTER — Emergency Department
Admission: EM | Admit: 2021-08-21 | Discharge: 2021-08-21 | Disposition: A | Discharge status: Home / Self Care | Payer: Medicaid Other | Attending: Emergency Medicine | Admitting: Emergency Medicine

## 2021-08-21 ENCOUNTER — Other Ambulatory Visit: Payer: Self-pay

## 2021-08-21 DIAGNOSIS — F1910 Other psychoactive substance abuse, uncomplicated: Secondary | ICD-10-CM | POA: Insufficient documentation

## 2021-08-21 DIAGNOSIS — R569 Unspecified convulsions: Secondary | ICD-10-CM | POA: Insufficient documentation

## 2021-08-21 DIAGNOSIS — J45909 Unspecified asthma, uncomplicated: Secondary | ICD-10-CM | POA: Insufficient documentation

## 2021-08-21 HISTORY — DX: Unspecified convulsions: R56.9

## 2021-08-21 LAB — URINE DRUG SCREEN, QUALITATIVE (ARMC ONLY)
Amphetamines, Ur Screen: POSITIVE — AB
Barbiturates, Ur Screen: NOT DETECTED
Benzodiazepine, Ur Scrn: NOT DETECTED
Cannabinoid 50 Ng, Ur ~~LOC~~: POSITIVE — AB
Cocaine Metabolite,Ur ~~LOC~~: POSITIVE — AB
MDMA (Ecstasy)Ur Screen: NOT DETECTED
Methadone Scn, Ur: NOT DETECTED
Opiate, Ur Screen: NOT DETECTED
Phencyclidine (PCP) Ur S: NOT DETECTED
Tricyclic, Ur Screen: NOT DETECTED

## 2021-08-21 LAB — URINALYSIS, COMPLETE (UACMP) WITH MICROSCOPIC
Bilirubin Urine: NEGATIVE
Glucose, UA: NEGATIVE mg/dL
Ketones, ur: NEGATIVE mg/dL
Nitrite: NEGATIVE
Protein, ur: 100 mg/dL — AB
Specific Gravity, Urine: 1.018 (ref 1.005–1.030)
pH: 6 (ref 5.0–8.0)

## 2021-08-21 LAB — COMPREHENSIVE METABOLIC PANEL
ALT: 29 U/L (ref 0–44)
AST: 42 U/L — ABNORMAL HIGH (ref 15–41)
Albumin: 4.1 g/dL (ref 3.5–5.0)
Alkaline Phosphatase: 66 U/L (ref 38–126)
Anion gap: 14 (ref 5–15)
BUN: 14 mg/dL (ref 6–20)
CO2: 19 mmol/L — ABNORMAL LOW (ref 22–32)
Calcium: 9.2 mg/dL (ref 8.9–10.3)
Chloride: 104 mmol/L (ref 98–111)
Creatinine, Ser: 0.87 mg/dL (ref 0.44–1.00)
GFR, Estimated: >60 mL/min (ref >60)
Glucose, Bld: 112 mg/dL — ABNORMAL HIGH (ref 70–99)
Potassium: 3.7 mmol/L (ref 3.5–5.1)
Sodium: 137 mmol/L (ref 135–145)
Total Bilirubin: 0.3 mg/dL (ref 0.3–1.2)
Total Protein: 7.6 g/dL (ref 6.5–8.1)

## 2021-08-21 LAB — CBC
HCT: 35.8 % — ABNORMAL LOW (ref 36.0–46.0)
Hemoglobin: 12.1 g/dL (ref 12.0–15.0)
MCH: 28.2 pg (ref 26.0–34.0)
MCHC: 33.8 g/dL (ref 30.0–36.0)
MCV: 83.4 fL (ref 80.0–100.0)
Platelets: 341 10*3/uL (ref 150–400)
RBC: 4.29 MIL/uL (ref 3.87–5.11)
RDW: 13.1 % (ref 11.5–15.5)
WBC: 8.9 10*3/uL (ref 4.0–10.5)
nRBC: 0 % (ref 0.0–0.2)

## 2021-08-21 LAB — HCG, QUANTITATIVE, PREGNANCY: hCG, Beta Chain, Quant, S: <1 m[IU]/mL (ref <5)

## 2021-08-21 LAB — ETHANOL: Alcohol, Ethyl (B): <10 mg/dL (ref <10)

## 2021-08-21 MED ORDER — LEVETIRACETAM 500 MG PO TABS: 500.0000 mg | ORAL_TABLET | Freq: Two times a day (BID) | ORAL | 6 refills | Status: DC

## 2021-08-21 MED ORDER — LEVETIRACETAM IN NACL 1500 MG/100ML IV SOLN
1500.0000 mg | Freq: Once | INTRAVENOUS | Status: AC
  Administered 2021-08-21: 1500 mg via INTRAVENOUS
  Filled 2021-08-21: qty 100

## 2021-08-21 NOTE — ED Triage Notes (Signed)
Pt c/o seizure tonight while asleep. EMS reports pt's mother walked in at end of the seizure. Pt reports she was feeling a little dizzy when she went to bed.  ?

## 2021-08-21 NOTE — ED Provider Notes (Signed)
? ?[facility] ?Provider Note ? ? ? Event Date/Time  ? First MD Initiated Contact with Patient 08/21/21 0215   ?  (approximate) ? ? ?History  ? ?Seizures ? ? ?HPI ? ?Colleen Pearson is a 31 y.o. female with a history of epilepsy, medication noncompliant, asthma who presents after having a generalized tonic-clonic seizure.  Patient reports that she has run out of her medications for over 6 months.  She does not have a doctor and no one to prescribe her antiepileptic medications.  She had a generalized tonic-clonic seizure while sleeping today.  Her mother heard her grunting and went to check on her, and found her seizing.  Patient reports that she usually has a seizure every week.  She does not often come to the hospital.  She denies any recent illness, fever or chills, chest pain or shortness of breath, abdominal pain, vomiting or diarrhea.  She denies any drugs or alcohol. ?  ? ? ?Past Medical History:  ?Diagnosis Date  ? Asthma   ? Cocaine use complicating pregnancy in third trimester 08/30/2017  ? Endometriosis   ? Ovarian cyst   ? Seizures (HCC)   ? Status post cesarean section 08/30/2017  ? ? ?Past Surgical History:  ?Procedure Laterality Date  ? CESAREAN SECTION N/A 08/30/2017  ? Procedure: CESAREAN SECTION;  Surgeon: Conard Novak, MD;  Location: [facility] ORS;  Service: Obstetrics;  Laterality: N/A;  ? LASER ABLATION/CAUTERIZATION OF ENDOMETRIAL IMPLANTS    ? ? ? ?Physical Exam  ? ?Triage Vital Signs: ?ED Triage Vitals  ?Enc Vitals Group  ?   BP --   ?   Pulse --   ?   Resp --   ?   Temp --   ?   Temp src --   ?   SpO2 --   ?   Weight 08/21/21 0215 134 lb (60.8 kg)  ?   Height 08/21/21 0215 5' (1.524 m)  ?   Head Circumference --   ?   Peak Flow --   ?   Pain Score 08/21/21 0214 0  ?   Pain Loc --   ?   Pain Edu? --   ?   Excl. in GC? --   ? ? ?Most recent vital signs: ?Vitals:  ? 08/21/21 0243 08/21/21 0320  ?BP:  108/68  ?Pulse:  79  ?Resp:  (!) 26  ?SpO2: 100% 100%   ? ? ? ?Constitutional: Alert and oriented. Well appearing and in no apparent distress. ?HEENT: ?     Head: Normocephalic and atraumatic.    ?     Eyes: Conjunctivae are normal. Sclera is non-icteric.  ?     Mouth/Throat: Mucous membranes are moist.  ?     Neck: Supple with no signs of meningismus. ?Cardiovascular: Regular rate and rhythm. No murmurs, gallops, or rubs. 2+ symmetrical distal pulses are present in all extremities.  ?Respiratory: Normal respiratory effort. Lungs are clear to auscultation bilaterally.  ?Gastrointestinal: Soft, non tender, and non distended with positive bowel sounds. No rebound or guarding. ?Genitourinary: No CVA tenderness. ?Musculoskeletal:  No edema, cyanosis, or erythema of extremities. ?Neurologic: Normal speech and language. Face is symmetric. Moving all extremities. No gross focal neurologic deficits are appreciated. ?Skin: Skin is warm, dry and intact. No rash noted. ?Psychiatric: Mood and affect are normal. Speech and behavior are normal. ? ?ED Results / Procedures / Treatments  ? ?Labs ?(all labs ordered are listed, but only abnormal results  are displayed) ?Labs Reviewed  ?CBC - Abnormal; Notable for the following components:  ?    Result Value  ? HCT 35.8 (*)   ? All other components within normal limits  ?COMPREHENSIVE METABOLIC PANEL - Abnormal; Notable for the following components:  ? CO2 19 (*)   ? Glucose, Bld 112 (*)   ? AST 42 (*)   ? All other components within normal limits  ?URINE DRUG SCREEN, QUALITATIVE ([facility] ONLY) - Abnormal; Notable for the following components:  ? Amphetamines, Ur Screen POSITIVE (*)   ? Cocaine Metabolite,Ur [place] POSITIVE (*)   ? Cannabinoid 50 Ng, Ur [place] POSITIVE (*)   ? All other components within normal limits  ?URINALYSIS, COMPLETE (UACMP) WITH MICROSCOPIC - Abnormal; Notable for the following components:  ? Color, Urine YELLOW (*)   ? APPearance CLOUDY (*)   ? Hgb urine dipstick SMALL (*)   ? Protein, ur 100 (*)   ? Leukocytes,Ua MODERATE  (*)   ? Bacteria, UA RARE (*)   ? All other components within normal limits  ?HCG, QUANTITATIVE, PREGNANCY  ?ETHANOL  ? ? ? ?EKG ? ?ED ECG REPORT ?I, Nita Sickle, the attending physician, personally viewed and interpreted this ECG. ? ?Sinus rhythm with a rate of 86, normal intervals, normal axis, no ST elevations or depressions. ? ?RADIOLOGY ?none ? ? ?PROCEDURES: ? ?Critical Care performed: No ? ?Procedures ? ? ? ?IMPRESSION / MDM / ASSESSMENT AND PLAN / ED COURSE  ?I reviewed the triage vital signs and the nursing notes. ? ?31 y.o. female with a history of epilepsy, medication noncompliant, asthma who presents after having a generalized tonic-clonic seizure.  Patient arrives after having a generalized tonic-clonic seizure in the setting of medication noncompliance.  Per EMS patient was postictal.  Upon arrival to the emergency room patient is well-appearing, GCS of 15, alert and oriented, neurologically intact, she has no complaints. ? ?Plan: We will load with Keppra, check blood work including CBC, CMP, ethanol level, UDS, urinalysis, and pregnancy test.  Seizure precautions initiated.  Patient placed on telemetry for close monitoring. ? ? ?MEDICATIONS GIVEN IN ED: ?Medications  ?levETIRAcetam (KEPPRA) IVPB 1500 mg/ 100 mL premix (0 mg Intravenous Stopped 08/21/21 0306)  ? ? ? ?ED COURSE: No significant electrolyte derangements, UDS positive for cannabinoids, cocaine and amphetamines.  UA with bacteria's and WBCs but patient denies any signs of a UTI.  No signs of sepsis with normal white count and no fever.  Pregnancy test negative.  We did discuss the importance of taking her antiepileptic medications and the importance of abstaining from drugs as these will lower her seizure threshold.  I discussed seizure precautions including not driving, operating heavy machinery, or swimming alone.  Recommended follow-up and a referral for primary care has been sent.  Prescription for her Keppra with refills for 6  months has been sent to the pharmacy.  Patient reports that she is able to afford it and will get the prescription in the morning.  She was loaded with Keppra and monitored for 2 hours in the emergency room and no further episodes of seizure.  Admission was considered but felt unnecessary since patient is now loaded and is able to afford her medications.  Discussed my standard return precautions. ? ? ?Consults: None ? ? ?EMR reviewed including patient's last visit to primary care doctor from May 2022 for contraceptive care ? ? ? ?FINAL CLINICAL IMPRESSION(S) / ED DIAGNOSES  ? ?Final diagnoses:  ?Seizure (HCC)  ?Polysubstance  abuse (HCC)  ? ? ? ?Rx / DC Orders  ? ?ED Discharge Orders   ? ?      Ordered  ?  levETIRAcetam (KEPPRA) 500 MG tablet  2 times daily       ? 08/21/21 0352  ? ?  ?  ? ?  ? ? ? ?Note:  This document was prepared using Dragon voice recognition software and may include unintentional dictation errors. ? ? ?Please note:  Patient was evaluated in Emergency Department today for the symptoms described in the history of present illness. Patient was evaluated in the context of the global COVID-19 pandemic, which necessitated consideration that the patient might be at risk for infection with the SARS-CoV-2 virus that causes COVID-19. Institutional protocols and algorithms that pertain to the evaluation of patients at risk for COVID-19 are in a state of rapid change based on information released by regulatory bodies including the CDC and federal and state organizations. These policies and algorithms were followed during the patient's care in the ED.  Some ED evaluations and interventions may be delayed as a result of limited staffing during the pandemic. ? ? ? ? ?  ?Nita Sickle, MD ?08/21/21 5427 ? ?

## 2022-05-09 ENCOUNTER — Encounter: Payer: Self-pay | Admitting: Intensive Care

## 2022-05-09 ENCOUNTER — Other Ambulatory Visit: Payer: Self-pay

## 2022-05-09 ENCOUNTER — Emergency Department
Admission: EM | Admit: 2022-05-09 | Discharge: 2022-05-09 | Disposition: A | Discharge status: Home / Self Care | Payer: Medicaid Other | Attending: Emergency Medicine | Admitting: Emergency Medicine

## 2022-05-09 DIAGNOSIS — G40909 Epilepsy, unspecified, not intractable, without status epilepticus: Secondary | ICD-10-CM | POA: Insufficient documentation

## 2022-05-09 LAB — BASIC METABOLIC PANEL
Anion gap: 7 (ref 5–15)
BUN: 10 mg/dL (ref 6–20)
CO2: 22 mmol/L (ref 22–32)
Calcium: 9 mg/dL (ref 8.9–10.3)
Chloride: 110 mmol/L (ref 98–111)
Creatinine, Ser: 0.73 mg/dL (ref 0.44–1.00)
GFR, Estimated: >60 mL/min (ref >60)
Glucose, Bld: 108 mg/dL — ABNORMAL HIGH (ref 70–99)
Potassium: 4 mmol/L (ref 3.5–5.1)
Sodium: 139 mmol/L (ref 135–145)

## 2022-05-09 LAB — CBC
HCT: 33 % — ABNORMAL LOW (ref 36.0–46.0)
Hemoglobin: 11.4 g/dL — ABNORMAL LOW (ref 12.0–15.0)
MCH: 28.1 pg (ref 26.0–34.0)
MCHC: 34.5 g/dL (ref 30.0–36.0)
MCV: 81.5 fL (ref 80.0–100.0)
Platelets: 287 10*3/uL (ref 150–400)
RBC: 4.05 MIL/uL (ref 3.87–5.11)
RDW: 13 % (ref 11.5–15.5)
WBC: 7.1 10*3/uL (ref 4.0–10.5)
nRBC: 0 % (ref 0.0–0.2)

## 2022-05-09 MED ORDER — LEVETIRACETAM 500 MG PO TABS
1000.0000 mg | ORAL_TABLET | Freq: Once | ORAL | Status: AC
  Administered 2022-05-09: 1000 mg via ORAL
  Filled 2022-05-09: qty 2

## 2022-05-09 NOTE — Discharge Instructions (Signed)
Please fill your Keppra prescription, and once again begin utilizing it.  It works best if you take it as prescribed and do not miss days.  Missing days puts you at risk of having additional seizures  No driving, as you informed me you do not currently drive, and no driving for at least 6 months.  [facility] as [facility] already [facility].  I also recommend you make an appointment with our neurologist

## 2022-05-09 NOTE — ED Provider Notes (Signed)
[facility] Provider Note    Event Date/Time   First MD Initiated Contact with Patient 05/09/22 1331     (approximate)   History   Seizures   HPI  Colleen Pearson is a 31 y.o. female who has a history of epilepsy  She was preparing to go to work today when she next recalls suddenly there were about "10 paramedics" at her side.  She was informed she had had a seizure.  She has had same in the past.  She bit her tongue on the left side and is no longer causing pain or bleeding.  She feels fine now.  She generally feels sore and has slight headache after her seizures, this is true of today and she is no longer experiencing a headache but just feels slightly achy.  She has been up able to move around and walk since then.  She reports she has not taken her seizure medicine for a few days.  She has 2 or 3 refills remaining at [facility] but had not yet picked it up.  She tells me that she is going to have her mother bring her to [facility] to pick up her Keppra prescription today  No recent illness.  No fevers or chills.  Denies pregnancy.  Has an Implanon.  No neck pain.     Physical Exam   Triage Vital Signs: ED Triage Vitals [05/09/22 1203]  Enc Vitals Group     BP 102/65     Pulse Rate 74     Resp 16     Temp 97.6 F (36.4 C)     Temp Source Oral     SpO2 98 %     Weight 125 lb (56.7 kg)     Height 5\' 1"  (1.549 m)     Head Circumference      Peak Flow      Pain Score 10     Pain Loc      Pain Edu?      Excl. in GC?     Most recent vital signs: Vitals:   05/09/22 1203  BP: 102/65  Pulse: 74  Resp: 16  Temp: 97.6 F (36.4 C)  SpO2: 98%     General: Awake, no distress.  Sitting comfortably.  Fully alert well oriented no distress.  Pleasant.CV:  Good peripheral perfusion.  Resp:  Normal effort. Abd:  No distention.  Moves all extremities normally.  Normal facial expressions.  Oropharynx is normal, she has a small no longer bleeding area  that appears to been a small laceration on the medial surface of the left tongue.  No lingular edema. Other:     ED Results / Procedures / Treatments   Labs (all labs ordered are listed, but only abnormal results are displayed) Labs Reviewed  CBC - Abnormal; Notable for the following components:      Result Value   Hemoglobin 11.4 (*)    HCT 33.0 (*)    All other components within normal limits  BASIC METABOLIC PANEL - Abnormal; Notable for the following components:   Glucose, Bld 108 (*)    All other components within normal limits  POC URINE PREG, ED     EKG     RADIOLOGY     PROCEDURES:  Critical Care performed: No  Procedures   MEDICATIONS ORDERED IN ED: Medications  levETIRAcetam (KEPPRA) tablet 1,000 mg (1,000 mg Oral Given 05/09/22 1346)     IMPRESSION / MDM / ASSESSMENT  AND PLAN / ED COURSE  I reviewed the triage vital signs and the nursing notes.                              Differential diagnosis includes, but is not limited to, seizure, medication noncompliance, electrolyte derangements, etc.  Patient has a known history of epilepsy.  Appears that she had a witnessed seizure-like episode today.  Her clinical examination and history are consistent with same.  She reports that she is typically taking Keppra, but ran out and has a couple of refills remaining at [facility].  She will be able to fill those today, instructed her to start taking tomorrow and we will give her Keppra oral load here prior to her departure.  She is currently awake alert well-appearing without distress.  No evidence of trauma or injury to the small tongue laceration.  She does not drive.  She understands not to put herself in dangerous areas which could result in severe injury if she had a seizure unexpectedly  Patient's presentation is most consistent with acute complicated illness / injury requiring diagnostic workup.    Patient's mother driving her home  Return  precautions and treatment recommendations and follow-up discussed with the patient who is agreeable with the plan.   FINAL CLINICAL IMPRESSION(S) / ED DIAGNOSES   Final diagnoses:  Nonintractable epilepsy without status epilepticus, unspecified epilepsy type (HCC)     Rx / DC Orders   ED Discharge Orders     None        Note:  This document was prepared using Dragon voice recognition software and may include unintentional dictation errors.   Sharyn Creamer, MD 05/09/22 1353

## 2022-05-09 NOTE — ED Triage Notes (Signed)
Pt in via EMS from home with c/ seizure that last 10 minutes. Pt takes Keppra once a day but has been out for 3 days. Pt with hx of seizures. Pt alert at this time. 108/54, CBG 143, HR 119, 98% RA.

## 2022-05-09 NOTE — ED Notes (Signed)
Patient has noted bite mark on left side of tongue. No bleeding at this time

## 2022-06-12 ENCOUNTER — Emergency Department
Admission: EM | Admit: 2022-06-12 | Discharge: 2022-06-12 | Disposition: A | Discharge status: Home / Self Care | Payer: Medicaid Other | Attending: Emergency Medicine | Admitting: Emergency Medicine

## 2022-06-12 ENCOUNTER — Encounter: Payer: Self-pay | Admitting: Emergency Medicine

## 2022-06-12 ENCOUNTER — Other Ambulatory Visit: Payer: Self-pay

## 2022-06-12 DIAGNOSIS — G40909 Epilepsy, unspecified, not intractable, without status epilepticus: Secondary | ICD-10-CM | POA: Diagnosis not present

## 2022-06-12 LAB — CBC
HCT: 33.1 % — ABNORMAL LOW (ref 36.0–46.0)
Hemoglobin: 11.4 g/dL — ABNORMAL LOW (ref 12.0–15.0)
MCH: 28.7 pg (ref 26.0–34.0)
MCHC: 34.4 g/dL (ref 30.0–36.0)
MCV: 83.4 fL (ref 80.0–100.0)
Platelets: 247 10*3/uL (ref 150–400)
RBC: 3.97 MIL/uL (ref 3.87–5.11)
RDW: 13.2 % (ref 11.5–15.5)
WBC: 6.1 10*3/uL (ref 4.0–10.5)
nRBC: 0 % (ref 0.0–0.2)

## 2022-06-12 LAB — BASIC METABOLIC PANEL
Anion gap: 6 (ref 5–15)
BUN: 14 mg/dL (ref 6–20)
CO2: 24 mmol/L (ref 22–32)
Calcium: 8.3 mg/dL — ABNORMAL LOW (ref 8.9–10.3)
Chloride: 109 mmol/L (ref 98–111)
Creatinine, Ser: 0.86 mg/dL (ref 0.44–1.00)
GFR, Estimated: >60 mL/min (ref >60)
Glucose, Bld: 121 mg/dL — ABNORMAL HIGH (ref 70–99)
Potassium: 3.8 mmol/L (ref 3.5–5.1)
Sodium: 139 mmol/L (ref 135–145)

## 2022-06-12 MED ORDER — LEVETIRACETAM 1000 MG PO TABS: 1000.0000 mg | ORAL_TABLET | Freq: Two times a day (BID) | ORAL | 1 refills | Status: DC

## 2022-06-12 MED ORDER — LEVETIRACETAM IN NACL 1500 MG/100ML IV SOLN
1500.0000 mg | Freq: Once | INTRAVENOUS | Status: AC
  Administered 2022-06-12: 1500 mg via INTRAVENOUS
  Filled 2022-06-12: qty 100

## 2022-06-12 NOTE — ED Triage Notes (Signed)
Pt to ED via EMS from home c/o seizures.  Witnessed seizure tonight by family lasting approx 5 min.  Hx of seizures and pt states can feel them coming on, felt typical.  Took seizure medication last night but forgot this morning.  Was post ictal with fire department, A&Ox4 with EMS and upon arrival, vomited x2 and given 4mg  zofran and 100cc normal saline, EMS vitals 113 CBG, 113/68 BP, 100% RA, 87 HR.  Denies biting tongue or pain, denies incontinence.

## 2022-06-12 NOTE — Discharge Instructions (Signed)
Stop taking the 500 mg Keppra and start taking 1000 mg twice daily.  We have prescribed a 11-month supply.  Return to the ER for any new or recurrent seizures or any other new or worsening symptoms that concern you..  Make sure to be careful not to miss any doses.  Follow-up with a neurologist; a referral has been provided.

## 2022-06-12 NOTE — ED Provider Notes (Addendum)
[facility] Provider Note    Event Date/Time   First MD Initiated Contact with Patient 06/12/22 1952     (approximate)   History   Seizures   HPI  Colleen Pearson is a 31 y.o. female with a known seizure disorder on Keppra 500 mg twice daily who presents after a seizure that was witnessed by family members at home.  She initially appeared postictal with the fire department and now has returned to baseline.  She had 2 episodes of vomiting but now denies any nausea, headache, or other acute symptoms.  She did not bite her tongue or have incontinence.  She states that her last seizure was about a month ago.  She reports that she has been compliant with her Keppra.  She had not yet taken her evening dose but did not miss any doses.  I reviewed the past medical records.  The patient was seen for a seizure on 11/18 after missing Keppra for some time.  In March she was seen at the [facility] ED for neck pain but LW BS.  She has no recent outpatient visits.   Physical Exam   Triage Vital Signs: ED Triage Vitals  Enc Vitals Group     BP      Pulse      Resp      Temp      Temp src      SpO2      Weight      Height      Head Circumference      Peak Flow      Pain Score      Pain Loc      Pain Edu?      Excl. in GC?     Most recent vital signs: Vitals:   06/12/22 2041  BP: (!) 104/48  Pulse: 66  Resp: 18  Temp: 97.8 F (36.6 C)  SpO2: 99%     General: Alert and oriented, well-appearing. CV:  Good peripheral perfusion.  Resp:  Normal effort.  Abd:  No distention.  Other:  EOMI.  PERRLA.  No facial droop.  Normal speech.  Motor and sensory intact in all extremities.  No ataxia on finger-to-nose.  No pronator drift.   ED Results / Procedures / Treatments   Labs (all labs ordered are listed, but only abnormal results are displayed) Labs Reviewed  BASIC METABOLIC PANEL - Abnormal; Notable for the following components:      Result Value    Glucose, Bld 121 (*)    Calcium 8.3 (*)    All other components within normal limits  CBC - Abnormal; Notable for the following components:   Hemoglobin 11.4 (*)    HCT 33.1 (*)    All other components within normal limits  POC URINE PREG, ED     EKG     RADIOLOGY    PROCEDURES:  Critical Care performed: No  Procedures   MEDICATIONS ORDERED IN ED: Medications  levETIRAcetam (KEPPRA) IVPB 1500 mg/ 100 mL premix (0 mg Intravenous Stopped 06/12/22 2055)     IMPRESSION / MDM / ASSESSMENT AND PLAN / ED COURSE  I reviewed the triage vital signs and the nursing notes.  31 year old female with PMH as noted above presents after an apparent seizure.  She states she has been compliant with Keppra.  She is currently asymptomatic and her neurologic exam is normal.  Differential diagnosis includes, but is not limited to, epileptic seizure, pseudoseizure, syncope.  We will obtain basic labs and observe the patient for few hours.  I will load with IV Keppra as the patient has not had her evening dose.  Patient's presentation is most consistent with acute presentation with potential threat to life or bodily function.  The patient is on the cardiac monitor to evaluate for evidence of arrhythmia and/or significant heart rate changes.   ----------------------------------------- 10:12 PM on 06/12/2022 -----------------------------------------  The patient received IV Keppra.  She has not had any recurrent seizure activity.  She remains asymptomatic.  Basic labs are unremarkable; electrolytes are normal and there is no leukocytosis or significant anemia.  She is stable for discharge home at this time.  Given that she has now had a couple of breakthrough seizures on her current dose of Keppra and has been on this dose for several months, we will increase to 1000 mg twice daily.  However, the patient has only received her Keppra prescriptions from the ED.  She needs follow-up with a  neurologist.  I counseled her on this and have referred her to see a neurologist at [facility] clinic.  Return precautions given, and she expresses understanding.   FINAL CLINICAL IMPRESSION(S) / ED DIAGNOSES   Final diagnoses:  Seizure disorder (HCC)     Rx / DC Orders   ED Discharge Orders          Ordered    levETIRAcetam (KEPPRA) 1000 MG tablet  2 times daily        06/12/22 2211             Note:  This document was prepared using Dragon voice recognition software and may include unintentional dictation errors.    Dionne Bucy, MD 06/12/22 2226    Dionne Bucy, MD 06/12/22 2227

## 2022-10-09 ENCOUNTER — Other Ambulatory Visit: Payer: Self-pay | Admitting: Neurology

## 2022-10-09 DIAGNOSIS — R42 Dizziness and giddiness: Secondary | ICD-10-CM

## 2022-10-09 DIAGNOSIS — R569 Unspecified convulsions: Secondary | ICD-10-CM

## 2022-12-17 ENCOUNTER — Emergency Department: Payer: Medicaid Other

## 2022-12-17 ENCOUNTER — Other Ambulatory Visit: Payer: Self-pay

## 2022-12-17 ENCOUNTER — Emergency Department
Admission: EM | Admit: 2022-12-17 | Discharge: 2022-12-17 | Disposition: A | Discharge status: Home / Self Care | Payer: Medicaid Other | Attending: Emergency Medicine | Admitting: Emergency Medicine

## 2022-12-17 ENCOUNTER — Encounter: Payer: Self-pay | Admitting: Pharmacy Technician

## 2022-12-17 DIAGNOSIS — G4089 Other seizures: Secondary | ICD-10-CM | POA: Diagnosis not present

## 2022-12-17 DIAGNOSIS — R531 Weakness: Secondary | ICD-10-CM | POA: Diagnosis not present

## 2022-12-17 DIAGNOSIS — R569 Unspecified convulsions: Secondary | ICD-10-CM | POA: Diagnosis present

## 2022-12-17 DIAGNOSIS — R202 Paresthesia of skin: Secondary | ICD-10-CM | POA: Insufficient documentation

## 2022-12-17 DIAGNOSIS — R0781 Pleurodynia: Secondary | ICD-10-CM | POA: Diagnosis not present

## 2022-12-17 DIAGNOSIS — R29898 Other symptoms and signs involving the musculoskeletal system: Secondary | ICD-10-CM

## 2022-12-17 DIAGNOSIS — G40919 Epilepsy, unspecified, intractable, without status epilepticus: Secondary | ICD-10-CM

## 2022-12-17 DIAGNOSIS — G8384 Todd's paralysis (postepileptic): Secondary | ICD-10-CM | POA: Diagnosis not present

## 2022-12-17 LAB — COMPREHENSIVE METABOLIC PANEL
ALT: 14 U/L (ref 0–44)
AST: 18 U/L (ref 15–41)
Albumin: 3.9 g/dL (ref 3.5–5.0)
Alkaline Phosphatase: 54 U/L (ref 38–126)
Anion gap: 5 (ref 5–15)
BUN: 15 mg/dL (ref 6–20)
CO2: 21 mmol/L — ABNORMAL LOW (ref 22–32)
Calcium: 8.6 mg/dL — ABNORMAL LOW (ref 8.9–10.3)
Chloride: 108 mmol/L (ref 98–111)
Creatinine, Ser: 0.83 mg/dL (ref 0.44–1.00)
GFR, Estimated: >60 mL/min (ref >60)
Glucose, Bld: 86 mg/dL (ref 70–99)
Potassium: 3.5 mmol/L (ref 3.5–5.1)
Sodium: 134 mmol/L — ABNORMAL LOW (ref 135–145)
Total Bilirubin: 0.7 mg/dL (ref 0.3–1.2)
Total Protein: 6.9 g/dL (ref 6.5–8.1)

## 2022-12-17 LAB — CBC WITH DIFFERENTIAL/PLATELET
Abs Immature Granulocytes: 0.03 10*3/uL (ref 0.00–0.07)
Basophils Absolute: 0 10*3/uL (ref 0.0–0.1)
Basophils Relative: 0 %
Eosinophils Absolute: 0 10*3/uL (ref 0.0–0.5)
Eosinophils Relative: 0 %
HCT: 34.7 % — ABNORMAL LOW (ref 36.0–46.0)
Hemoglobin: 12.1 g/dL (ref 12.0–15.0)
Immature Granulocytes: 0 %
Lymphocytes Relative: 10 %
Lymphs Abs: 0.7 10*3/uL (ref 0.7–4.0)
MCH: 29.1 pg (ref 26.0–34.0)
MCHC: 34.9 g/dL (ref 30.0–36.0)
MCV: 83.4 fL (ref 80.0–100.0)
Monocytes Absolute: 0.4 10*3/uL (ref 0.1–1.0)
Monocytes Relative: 6 %
Neutro Abs: 6.1 10*3/uL (ref 1.7–7.7)
Neutrophils Relative %: 84 %
Platelets: 271 10*3/uL (ref 150–400)
RBC: 4.16 MIL/uL (ref 3.87–5.11)
RDW: 12.8 % (ref 11.5–15.5)
WBC: 7.3 10*3/uL (ref 4.0–10.5)
nRBC: 0 % (ref 0.0–0.2)

## 2022-12-17 LAB — POC URINE PREG, ED: Preg Test, Ur: NEGATIVE

## 2022-12-17 MED ORDER — GADOBUTROL 1 MMOL/ML IV SOLN
6.0000 mL | Freq: Once | INTRAVENOUS | Status: AC | PRN
  Administered 2022-12-17: 6 mL via INTRAVENOUS

## 2022-12-17 NOTE — Discharge Instructions (Signed)
You were seen in the ER today after a seizure-like episode with right-sided weakness and numbness.  Please fill your prescription and take your medication as directed.  Let your neurologist know you were seen in the ER and arrange follow-up with them as soon as possible.  Please do not drive, swim or bathe unsupervised, or climb to heights for six months or until otherwise instructed by your doctor.  Return to the ER for new or worsening symptoms.

## 2022-12-17 NOTE — ED Provider Notes (Signed)
[facility] Provider Note    Event Date/Time   First MD Initiated Contact with Patient 12/17/22 1009     (approximate)   History   Seizures   HPI  Colleen Pearson is a 32 y.o. female with history of epilepsy presenting to the emergency department for evaluation following a breakthrough seizure episode.  Episode lasted for about a minute.  Patient did hit her head and her left chest, complains of rib pain.  Does additionally report blurry vision as well as numbness over her right arm and weakness.  Has had issues with weakness, but sensation changes are new.  Did miss her dose of Lamictal this morning and took a double dose of Keppra instead.  Does report headache.    Physical Exam   Triage Vital Signs: ED Triage Vitals  Enc Vitals Group     BP 12/17/22 1041 127/71     Pulse Rate 12/17/22 1041 63     Resp 12/17/22 1041 18     Temp 12/17/22 1041 98.1 F (36.7 C)     Temp src --      SpO2 12/17/22 1041 97 %     Weight --      Height --      Head Circumference --      Peak Flow --      Pain Score 12/17/22 1040 5     Pain Loc --      Pain Edu? --      Excl. in GC? --     Most recent vital signs: Vitals:   12/17/22 1041  BP: 127/71  Pulse: 63  Resp: 18  Temp: 98.1 F (36.7 C)  SpO2: 97%     General: Awake, interactive  CV:  Regular rate, good peripheral perfusion.  Resp:  Lungs clear, unlabored respirations.  Abd:  Soft, nondistended.  Neuro:  Symmetric facial movement, fluid speech, unable to sense light touch or pinpoint over the right upper extremity except in the fingertips, normal sensation throughout the remainder of the extremities.  Impaired vision over the right eye, 20/200, slightly impaired about 20/30 with the left eye.  5 of 5 strength in the left upper and bilateral lower extremities.  4/5 strength in the right upper extremity.   ED Results / Procedures / Treatments   Labs (all labs ordered are listed, but only  abnormal results are displayed) Labs Reviewed  COMPREHENSIVE METABOLIC PANEL - Abnormal; Notable for the following components:      Result Value   Sodium 134 (*)    CO2 21 (*)    Calcium 8.6 (*)    All other components within normal limits  CBC WITH DIFFERENTIAL/PLATELET - Abnormal; Notable for the following components:   HCT 34.7 (*)    All other components within normal limits  POC URINE PREG, ED     EKG EKG independently reviewed interpreted by myself (ER attending) demonstrates:    RADIOLOGY Imaging independently reviewed and interpreted by myself demonstrates:  X-Renae Mottley without evidence of rib fracture CT head without acute bleed, does demonstrate basal ganglia mineralization MRI brain without acute infarct  PROCEDURES:  Critical Care performed: No  Procedures   MEDICATIONS ORDERED IN ED: Medications  gadobutrol (GADAVIST) 1 MMOL/ML injection 6 mL (6 mLs Intravenous Contrast Given 12/17/22 1210)     IMPRESSION / MDM / ASSESSMENT AND PLAN / ED COURSE  I reviewed the triage vital signs and the nursing notes.  Differential diagnosis includes, but is not  limited to, breakthrough seizure in the setting of medication nonadherence, anemia, electrolyte abnormality, Todd's paralysis, CVA, demyelinating process, other acute intracranial process  Patient's presentation is most consistent with acute presentation with potential threat to life or bodily function.  32 year old female presenting with breakthrough seizure with known history of epilepsy found to have new right arm numbness and blurred vision.  Case reviewed with Dr. Wilford Corner.  Patient did present within the thrombolytic window, but her clinical presentation is much more suggestive of Todd's paralysis.  He did recommend obtaining an MRI, but did not feel that the patient needs to be activated as a code stroke.  Imaging reviewed with Dr. Jerrell Belfast.  He did feel that the patient could be discharged home with continued outpatient  follow-up.  Discussed with patient who is comfortable this plan.  Strict return precautions provided.     FINAL CLINICAL IMPRESSION(S) / ED DIAGNOSES   Final diagnoses:  Breakthrough seizure (HCC)  Todd's paralysis (HCC)  Right arm weakness  Paresthesia of right arm     Rx / DC Orders   ED Discharge Orders     None        Note:  This document was prepared using Dragon voice recognition software and may include unintentional dictation errors.   Trinna Post, MD 12/17/22 1640

## 2022-12-17 NOTE — ED Notes (Signed)
Patient Alert and oriented to baseline. Stable and ambulatory to baseline. Patient verbalized understanding of the discharge instructions.  Patient belongings were taken by the patient.   

## 2022-12-17 NOTE — ED Notes (Signed)
Patient transported to MRI 

## 2022-12-17 NOTE — ED Triage Notes (Signed)
Pt bib ems with witnessed seizure, hx epilepsy. Pt fell between bed and dresser, complains of ribcage pain. 12 lead unremarkable. Pt awake and alert on arrival. Pt endorses grey blurry vision to R eye and decreased sensation to R clavicle to hand. Pt with weakness to R side as well. Sensation deficits and weakness started after seizure this morning. Missed dose of lamictal this morning. Took double dose of keppra this morning. Pt endorses headache.

## 2023-02-22 ENCOUNTER — Emergency Department (HOSPITAL_COMMUNITY): Payer: Medicaid Other

## 2023-02-22 ENCOUNTER — Inpatient Hospital Stay (HOSPITAL_COMMUNITY): Payer: Medicaid Other

## 2023-02-22 ENCOUNTER — Inpatient Hospital Stay (HOSPITAL_COMMUNITY)
Admission: EM | Admit: 2023-02-22 | Discharge: 2023-03-03 | DRG: 100 | Disposition: A | Discharge status: Rehabilitation Facility | Payer: Medicaid Other | Attending: Internal Medicine | Admitting: Internal Medicine

## 2023-02-22 ENCOUNTER — Other Ambulatory Visit: Payer: Self-pay

## 2023-02-22 DIAGNOSIS — A4102 Sepsis due to Methicillin resistant Staphylococcus aureus: Secondary | ICD-10-CM | POA: Diagnosis present

## 2023-02-22 DIAGNOSIS — J45909 Unspecified asthma, uncomplicated: Secondary | ICD-10-CM | POA: Diagnosis present

## 2023-02-22 DIAGNOSIS — F1490 Cocaine use, unspecified, uncomplicated: Secondary | ICD-10-CM | POA: Diagnosis present

## 2023-02-22 DIAGNOSIS — F1290 Cannabis use, unspecified, uncomplicated: Secondary | ICD-10-CM | POA: Diagnosis present

## 2023-02-22 DIAGNOSIS — I952 Hypotension due to drugs: Secondary | ICD-10-CM | POA: Diagnosis not present

## 2023-02-22 DIAGNOSIS — F05 Delirium due to known physiological condition: Secondary | ICD-10-CM | POA: Diagnosis not present

## 2023-02-22 DIAGNOSIS — R739 Hyperglycemia, unspecified: Secondary | ICD-10-CM | POA: Diagnosis present

## 2023-02-22 DIAGNOSIS — F419 Anxiety disorder, unspecified: Secondary | ICD-10-CM | POA: Diagnosis present

## 2023-02-22 DIAGNOSIS — R6521 Severe sepsis with septic shock: Secondary | ICD-10-CM | POA: Diagnosis not present

## 2023-02-22 DIAGNOSIS — J690 Pneumonitis due to inhalation of food and vomit: Secondary | ICD-10-CM

## 2023-02-22 DIAGNOSIS — R6520 Severe sepsis without septic shock: Secondary | ICD-10-CM | POA: Diagnosis not present

## 2023-02-22 DIAGNOSIS — Z888 Allergy status to other drugs, medicaments and biological substances status: Secondary | ICD-10-CM

## 2023-02-22 DIAGNOSIS — R1310 Dysphagia, unspecified: Secondary | ICD-10-CM | POA: Diagnosis not present

## 2023-02-22 DIAGNOSIS — R0602 Shortness of breath: Secondary | ICD-10-CM | POA: Diagnosis not present

## 2023-02-22 DIAGNOSIS — J9601 Acute respiratory failure with hypoxia: Secondary | ICD-10-CM | POA: Diagnosis not present

## 2023-02-22 DIAGNOSIS — F17210 Nicotine dependence, cigarettes, uncomplicated: Secondary | ICD-10-CM | POA: Diagnosis present

## 2023-02-22 DIAGNOSIS — R000 Tachycardia, unspecified: Secondary | ICD-10-CM | POA: Diagnosis not present

## 2023-02-22 DIAGNOSIS — E872 Acidosis, unspecified: Secondary | ICD-10-CM | POA: Diagnosis present

## 2023-02-22 DIAGNOSIS — M6282 Rhabdomyolysis: Secondary | ICD-10-CM | POA: Insufficient documentation

## 2023-02-22 DIAGNOSIS — G40901 Epilepsy, unspecified, not intractable, with status epilepticus: Principal | ICD-10-CM | POA: Diagnosis present

## 2023-02-22 DIAGNOSIS — J9602 Acute respiratory failure with hypercapnia: Secondary | ICD-10-CM | POA: Diagnosis not present

## 2023-02-22 DIAGNOSIS — F1910 Other psychoactive substance abuse, uncomplicated: Secondary | ICD-10-CM | POA: Diagnosis not present

## 2023-02-22 DIAGNOSIS — R5381 Other malaise: Secondary | ICD-10-CM | POA: Diagnosis not present

## 2023-02-22 DIAGNOSIS — Z885 Allergy status to narcotic agent status: Secondary | ICD-10-CM

## 2023-02-22 DIAGNOSIS — I959 Hypotension, unspecified: Secondary | ICD-10-CM | POA: Diagnosis present

## 2023-02-22 DIAGNOSIS — R410 Disorientation, unspecified: Secondary | ICD-10-CM | POA: Diagnosis not present

## 2023-02-22 DIAGNOSIS — U071 COVID-19: Secondary | ICD-10-CM | POA: Insufficient documentation

## 2023-02-22 DIAGNOSIS — J9600 Acute respiratory failure, unspecified whether with hypoxia or hypercapnia: Secondary | ICD-10-CM

## 2023-02-22 DIAGNOSIS — N179 Acute kidney failure, unspecified: Secondary | ICD-10-CM | POA: Diagnosis present

## 2023-02-22 DIAGNOSIS — Z880 Allergy status to penicillin: Secondary | ICD-10-CM

## 2023-02-22 DIAGNOSIS — Z79899 Other long term (current) drug therapy: Secondary | ICD-10-CM

## 2023-02-22 DIAGNOSIS — B9562 Methicillin resistant Staphylococcus aureus infection as the cause of diseases classified elsewhere: Secondary | ICD-10-CM | POA: Diagnosis not present

## 2023-02-22 DIAGNOSIS — F17290 Nicotine dependence, other tobacco product, uncomplicated: Secondary | ICD-10-CM | POA: Diagnosis present

## 2023-02-22 DIAGNOSIS — I5A Non-ischemic myocardial injury (non-traumatic): Secondary | ICD-10-CM | POA: Diagnosis not present

## 2023-02-22 DIAGNOSIS — R7989 Other specified abnormal findings of blood chemistry: Secondary | ICD-10-CM | POA: Diagnosis not present

## 2023-02-22 DIAGNOSIS — M7989 Other specified soft tissue disorders: Secondary | ICD-10-CM | POA: Diagnosis not present

## 2023-02-22 DIAGNOSIS — R079 Chest pain, unspecified: Secondary | ICD-10-CM | POA: Diagnosis not present

## 2023-02-22 DIAGNOSIS — T796XXD Traumatic ischemia of muscle, subsequent encounter: Secondary | ICD-10-CM | POA: Diagnosis not present

## 2023-02-22 DIAGNOSIS — T4275XA Adverse effect of unspecified antiepileptic and sedative-hypnotic drugs, initial encounter: Secondary | ICD-10-CM | POA: Diagnosis not present

## 2023-02-22 DIAGNOSIS — Z793 Long term (current) use of hormonal contraceptives: Secondary | ICD-10-CM

## 2023-02-22 DIAGNOSIS — R569 Unspecified convulsions: Secondary | ICD-10-CM | POA: Diagnosis not present

## 2023-02-22 DIAGNOSIS — J1282 Pneumonia due to coronavirus disease 2019: Secondary | ICD-10-CM | POA: Diagnosis present

## 2023-02-22 DIAGNOSIS — T796XXA Traumatic ischemia of muscle, initial encounter: Secondary | ICD-10-CM | POA: Diagnosis present

## 2023-02-22 DIAGNOSIS — M79652 Pain in left thigh: Secondary | ICD-10-CM | POA: Diagnosis not present

## 2023-02-22 LAB — CBC WITH DIFFERENTIAL/PLATELET
Abs Immature Granulocytes: 0.21 10*3/uL — ABNORMAL HIGH (ref 0.00–0.07)
Basophils Absolute: 0.1 10*3/uL (ref 0.0–0.1)
Basophils Relative: 0 %
Eosinophils Absolute: 0.4 10*3/uL (ref 0.0–0.5)
Eosinophils Relative: 2 %
HCT: 39.2 % (ref 36.0–46.0)
Hemoglobin: 12 g/dL (ref 12.0–15.0)
Immature Granulocytes: 1 %
Lymphocytes Relative: 47 %
Lymphs Abs: 8.5 10*3/uL — ABNORMAL HIGH (ref 0.7–4.0)
MCH: 29 pg (ref 26.0–34.0)
MCHC: 30.6 g/dL (ref 30.0–36.0)
MCV: 94.7 fL (ref 80.0–100.0)
Monocytes Absolute: 1.7 10*3/uL — ABNORMAL HIGH (ref 0.1–1.0)
Monocytes Relative: 10 %
Neutro Abs: 7.2 10*3/uL (ref 1.7–7.7)
Neutrophils Relative %: 40 %
Platelets: 379 10*3/uL (ref 150–400)
RBC: 4.14 MIL/uL (ref 3.87–5.11)
RDW: 11.9 % (ref 11.5–15.5)
WBC: 18.1 10*3/uL — ABNORMAL HIGH (ref 4.0–10.5)
nRBC: 0 % (ref 0.0–0.2)

## 2023-02-22 LAB — AMMONIA: Ammonia: 45 umol/L — ABNORMAL HIGH (ref 9–35)

## 2023-02-22 LAB — CBC
HCT: 34.2 % — ABNORMAL LOW (ref 36.0–46.0)
HCT: 34.9 % — ABNORMAL LOW (ref 36.0–46.0)
Hemoglobin: 11.2 g/dL — ABNORMAL LOW (ref 12.0–15.0)
Hemoglobin: 11.8 g/dL — ABNORMAL LOW (ref 12.0–15.0)
MCH: 28.4 pg (ref 26.0–34.0)
MCH: 29.3 pg (ref 26.0–34.0)
MCHC: 32.7 g/dL (ref 30.0–36.0)
MCHC: 33.8 g/dL (ref 30.0–36.0)
MCV: 86.8 fL (ref 80.0–100.0)
MCV: 86.6 fL (ref 80.0–100.0)
Platelets: 223 10*3/uL (ref 150–400)
Platelets: 196 10*3/uL (ref 150–400)
RBC: 3.94 MIL/uL (ref 3.87–5.11)
RBC: 4.03 MIL/uL (ref 3.87–5.11)
RDW: 12.2 % (ref 11.5–15.5)
RDW: 12.2 % (ref 11.5–15.5)
WBC: 13.8 10*3/uL — ABNORMAL HIGH (ref 4.0–10.5)
WBC: 12 10*3/uL — ABNORMAL HIGH (ref 4.0–10.5)
nRBC: 0 % (ref 0.0–0.2)
nRBC: 0 % (ref 0.0–0.2)

## 2023-02-22 LAB — COMPREHENSIVE METABOLIC PANEL
ALT: 21 U/L (ref 0–44)
ALT: 36 U/L (ref 0–44)
AST: 35 U/L (ref 15–41)
AST: 80 U/L — ABNORMAL HIGH (ref 15–41)
Albumin: 3.1 g/dL — ABNORMAL LOW (ref 3.5–5.0)
Albumin: 3.6 g/dL (ref 3.5–5.0)
Alkaline Phosphatase: 62 U/L (ref 38–126)
Alkaline Phosphatase: 82 U/L (ref 38–126)
Anion gap: 7 (ref 5–15)
BUN: 15 mg/dL (ref 6–20)
BUN: 17 mg/dL (ref 6–20)
CO2: 17 mmol/L — ABNORMAL LOW (ref 22–32)
CO2: <7 mmol/L — ABNORMAL LOW (ref 22–32)
Calcium: 7.4 mg/dL — ABNORMAL LOW (ref 8.9–10.3)
Calcium: 8.9 mg/dL (ref 8.9–10.3)
Chloride: 100 mmol/L (ref 98–111)
Chloride: 110 mmol/L (ref 98–111)
Creatinine, Ser: 1.58 mg/dL — ABNORMAL HIGH (ref 0.44–1.00)
Creatinine, Ser: 1.74 mg/dL — ABNORMAL HIGH (ref 0.44–1.00)
GFR, Estimated: 39 mL/min — ABNORMAL LOW (ref >60)
GFR, Estimated: 44 mL/min — ABNORMAL LOW (ref >60)
Glucose, Bld: 120 mg/dL — ABNORMAL HIGH (ref 70–99)
Glucose, Bld: 274 mg/dL — ABNORMAL HIGH (ref 70–99)
Potassium: 3.5 mmol/L (ref 3.5–5.1)
Potassium: 4.5 mmol/L (ref 3.5–5.1)
Sodium: 131 mmol/L — ABNORMAL LOW (ref 135–145)
Sodium: 134 mmol/L — ABNORMAL LOW (ref 135–145)
Total Bilirubin: 0.4 mg/dL (ref 0.3–1.2)
Total Bilirubin: 0.5 mg/dL (ref 0.3–1.2)
Total Protein: 5.8 g/dL — ABNORMAL LOW (ref 6.5–8.1)
Total Protein: 6.6 g/dL (ref 6.5–8.1)

## 2023-02-22 LAB — GLUCOSE, CAPILLARY
Glucose-Capillary: 117 mg/dL — ABNORMAL HIGH (ref 70–99)
Glucose-Capillary: 93 mg/dL (ref 70–99)
Glucose-Capillary: 108 mg/dL — ABNORMAL HIGH (ref 70–99)
Glucose-Capillary: 108 mg/dL — ABNORMAL HIGH (ref 70–99)
Glucose-Capillary: 101 mg/dL — ABNORMAL HIGH (ref 70–99)
Glucose-Capillary: 142 mg/dL — ABNORMAL HIGH (ref 70–99)

## 2023-02-22 LAB — POCT I-STAT 7, (LYTES, BLD GAS, ICA,H+H)
Acid-base deficit: 10 mmol/L — ABNORMAL HIGH (ref 0.0–2.0)
Acid-base deficit: 12 mmol/L — ABNORMAL HIGH (ref 0.0–2.0)
Bicarbonate: 15.5 mmol/L — ABNORMAL LOW (ref 20.0–28.0)
Bicarbonate: 14.4 mmol/L — ABNORMAL LOW (ref 20.0–28.0)
Calcium, Ion: 1.09 mmol/L — ABNORMAL LOW (ref 1.15–1.40)
Calcium, Ion: 1.12 mmol/L — ABNORMAL LOW (ref 1.15–1.40)
HCT: 30 % — ABNORMAL LOW (ref 36.0–46.0)
HCT: 35 % — ABNORMAL LOW (ref 36.0–46.0)
Hemoglobin: 10.2 g/dL — ABNORMAL LOW (ref 12.0–15.0)
Hemoglobin: 11.9 g/dL — ABNORMAL LOW (ref 12.0–15.0)
O2 Saturation: 91 %
O2 Saturation: 90 %
Patient temperature: 39.2
Patient temperature: 38.9
Potassium: 3.8 mmol/L (ref 3.5–5.1)
Potassium: 3.8 mmol/L (ref 3.5–5.1)
Sodium: 135 mmol/L (ref 135–145)
Sodium: 137 mmol/L (ref 135–145)
TCO2: 16 mmol/L — ABNORMAL LOW (ref 22–32)
TCO2: 15 mmol/L — ABNORMAL LOW (ref 22–32)
pCO2 arterial: 34.3 mmHg (ref 32–48)
pCO2 arterial: 35.1 mmHg (ref 32–48)
pH, Arterial: 7.274 — ABNORMAL LOW (ref 7.35–7.45)
pH, Arterial: 7.231 — ABNORMAL LOW (ref 7.35–7.45)
pO2, Arterial: 77 mmHg — ABNORMAL LOW (ref 83–108)
pO2, Arterial: 77 mmHg — ABNORMAL LOW (ref 83–108)

## 2023-02-22 LAB — I-STAT CHEM 8, ED
BUN: 17 mg/dL (ref 6–20)
Calcium, Ion: 1.19 mmol/L (ref 1.15–1.40)
Chloride: 105 mmol/L (ref 98–111)
Creatinine, Ser: 1.6 mg/dL — ABNORMAL HIGH (ref 0.44–1.00)
Glucose, Bld: 261 mg/dL — ABNORMAL HIGH (ref 70–99)
HCT: 38 % (ref 36.0–46.0)
Hemoglobin: 12.9 g/dL (ref 12.0–15.0)
Potassium: 4.5 mmol/L (ref 3.5–5.1)
Sodium: 134 mmol/L — ABNORMAL LOW (ref 135–145)
TCO2: 9 mmol/L — ABNORMAL LOW (ref 22–32)

## 2023-02-22 LAB — URINALYSIS, ROUTINE W REFLEX MICROSCOPIC
Bacteria, UA: NONE SEEN
Bilirubin Urine: NEGATIVE
Glucose, UA: 150 mg/dL — AB
Ketones, ur: NEGATIVE mg/dL
Leukocytes,Ua: NEGATIVE
Nitrite: NEGATIVE
Protein, ur: 100 mg/dL — AB
Specific Gravity, Urine: 1.011 (ref 1.005–1.030)
pH: 6 (ref 5.0–8.0)

## 2023-02-22 LAB — I-STAT VENOUS BLOOD GAS, ED
Acid-base deficit: 27 mmol/L — ABNORMAL HIGH (ref 0.0–2.0)
Bicarbonate: 6.3 mmol/L — ABNORMAL LOW (ref 20.0–28.0)
Calcium, Ion: 1.2 mmol/L (ref 1.15–1.40)
HCT: 37 % (ref 36.0–46.0)
Hemoglobin: 12.6 g/dL (ref 12.0–15.0)
O2 Saturation: 98 %
Potassium: 4.5 mmol/L (ref 3.5–5.1)
Sodium: 135 mmol/L (ref 135–145)
TCO2: 7 mmol/L — ABNORMAL LOW (ref 22–32)
pCO2, Ven: 36.9 mmHg — ABNORMAL LOW (ref 44–60)
pH, Ven: 6.838 — CL (ref 7.25–7.43)
pO2, Ven: 189 mmHg — ABNORMAL HIGH (ref 32–45)

## 2023-02-22 LAB — LACTIC ACID, PLASMA: Lactic Acid, Venous: 2.8 mmol/L (ref 0.5–1.9)

## 2023-02-22 LAB — PROCALCITONIN
Procalcitonin: 0.74 ng/mL
Procalcitonin: 4.2 ng/mL

## 2023-02-22 LAB — HCG, SERUM, QUALITATIVE: Preg, Serum: NEGATIVE

## 2023-02-22 LAB — I-STAT ARTERIAL BLOOD GAS, ED
Acid-base deficit: 17 mmol/L — ABNORMAL HIGH (ref 0.0–2.0)
Bicarbonate: 10.7 mmol/L — ABNORMAL LOW (ref 20.0–28.0)
Calcium, Ion: 1.14 mmol/L — ABNORMAL LOW (ref 1.15–1.40)
HCT: 30 % — ABNORMAL LOW (ref 36.0–46.0)
Hemoglobin: 10.2 g/dL — ABNORMAL LOW (ref 12.0–15.0)
O2 Saturation: 100 %
Patient temperature: 99.5
Potassium: 3.9 mmol/L (ref 3.5–5.1)
Sodium: 136 mmol/L (ref 135–145)
TCO2: 12 mmol/L — ABNORMAL LOW (ref 22–32)
pCO2 arterial: 32.1 mmHg (ref 32–48)
pH, Arterial: 7.133 — CL (ref 7.35–7.45)
pO2, Arterial: 492 mmHg — ABNORMAL HIGH (ref 83–108)

## 2023-02-22 LAB — I-STAT CG4 LACTIC ACID, ED
Lactic Acid, Venous: 6.3 mmol/L (ref 0.5–1.9)
Lactic Acid, Venous: >15 mmol/L (ref 0.5–1.9)

## 2023-02-22 LAB — RAPID URINE DRUG SCREEN, HOSP PERFORMED
Amphetamines: NOT DETECTED
Barbiturates: NOT DETECTED
Benzodiazepines: POSITIVE — AB
Cocaine: NOT DETECTED
Opiates: NOT DETECTED
Tetrahydrocannabinol: POSITIVE — AB

## 2023-02-22 LAB — BETA-HYDROXYBUTYRIC ACID: Beta-Hydroxybutyric Acid: 0.12 mmol/L (ref 0.05–0.27)

## 2023-02-22 LAB — MAGNESIUM: Magnesium: 2.3 mg/dL (ref 1.7–2.4)

## 2023-02-22 LAB — LIPASE, BLOOD: Lipase: 46 U/L (ref 11–51)

## 2023-02-22 LAB — CK
Total CK: 252 U/L — ABNORMAL HIGH (ref 38–234)
Total CK: 3583 U/L — ABNORMAL HIGH (ref 38–234)

## 2023-02-22 LAB — HEMOGLOBIN A1C
Hgb A1c MFr Bld: 5 % (ref 4.8–5.6)
Mean Plasma Glucose: 96.8 mg/dL

## 2023-02-22 LAB — PHOSPHORUS
Phosphorus: 2.5 mg/dL (ref 2.5–4.6)
Phosphorus: 3.6 mg/dL (ref 2.5–4.6)

## 2023-02-22 LAB — CREATININE, SERUM
Creatinine, Ser: 1.4 mg/dL — ABNORMAL HIGH (ref 0.44–1.00)
GFR, Estimated: 51 mL/min — ABNORMAL LOW (ref >60)

## 2023-02-22 LAB — MRSA NEXT GEN BY PCR, NASAL: MRSA by PCR Next Gen: DETECTED — AB

## 2023-02-22 LAB — ETHANOL: Alcohol, Ethyl (B): <10 mg/dL (ref <10)

## 2023-02-22 LAB — HIV ANTIBODY (ROUTINE TESTING W REFLEX): HIV Screen 4th Generation wRfx: NONREACTIVE

## 2023-02-22 MED ORDER — IOHEXOL 350 MG/ML SOLN
75.0000 mL | Freq: Once | INTRAVENOUS | Status: AC | PRN
  Administered 2023-02-22: 75 mL via INTRAVENOUS

## 2023-02-22 MED ORDER — CHLORHEXIDINE GLUCONATE CLOTH 2 % EX PADS
6.0000 | MEDICATED_PAD | Freq: Every day | CUTANEOUS | Status: DC
  Administered 2023-02-22 – 2023-03-03 (×11): 6 via TOPICAL

## 2023-02-22 MED ORDER — POLYETHYLENE GLYCOL 3350 17 G PO PACK
17.0000 g | PACK | Freq: Every day | ORAL | Status: DC
  Administered 2023-02-22 – 2023-02-25 (×3): 17 g
  Filled 2023-02-22 (×4): qty 1

## 2023-02-22 MED ORDER — LAMOTRIGINE 150 MG PO TABS
150.0000 mg | ORAL_TABLET | Freq: Two times a day (BID) | ORAL | Status: DC
  Administered 2023-02-22 – 2023-03-03 (×18): 150 mg
  Filled 2023-02-22: qty 2
  Filled 2023-02-22 (×2): qty 1
  Filled 2023-02-22 (×3): qty 2
  Filled 2023-02-22: qty 1
  Filled 2023-02-22: qty 2
  Filled 2023-02-22: qty 1
  Filled 2023-02-22 (×3): qty 2
  Filled 2023-02-22: qty 1
  Filled 2023-02-22 (×2): qty 2
  Filled 2023-02-22: qty 1
  Filled 2023-02-22: qty 2
  Filled 2023-02-22: qty 1
  Filled 2023-02-22 (×2): qty 2

## 2023-02-22 MED ORDER — FENTANYL BOLUS VIA INFUSION
50.0000 ug | INTRAVENOUS | Status: DC | PRN
  Administered 2023-02-25 – 2023-02-26 (×8): 100 ug via INTRAVENOUS

## 2023-02-22 MED ORDER — FENTANYL CITRATE PF 50 MCG/ML IJ SOSY
50.0000 ug | PREFILLED_SYRINGE | INTRAMUSCULAR | Status: AC | PRN
  Administered 2023-02-22 – 2023-02-26 (×3): 50 ug via INTRAVENOUS
  Filled 2023-02-22: qty 1

## 2023-02-22 MED ORDER — DOCUSATE SODIUM 50 MG/5ML PO LIQD: 100.0000 mg | Freq: Two times a day (BID) | ORAL | Status: DC | PRN

## 2023-02-22 MED ORDER — CHLORHEXIDINE GLUCONATE CLOTH 2 % EX PADS
6.0000 | MEDICATED_PAD | Freq: Every day | CUTANEOUS | Status: AC
  Administered 2023-02-22 – 2023-02-26 (×4): 6 via TOPICAL

## 2023-02-22 MED ORDER — POTASSIUM CHLORIDE 20 MEQ PO PACK
40.0000 meq | PACK | Freq: Once | ORAL | Status: AC
  Administered 2023-02-22: 40 meq
  Filled 2023-02-22: qty 2

## 2023-02-22 MED ORDER — ORAL CARE MOUTH RINSE
15.0000 mL | OROMUCOSAL | Status: DC
  Administered 2023-02-22 – 2023-02-23 (×21): 15 mL via OROMUCOSAL

## 2023-02-22 MED ORDER — INSULIN ASPART 100 UNIT/ML IJ SOLN
0.0000 [IU] | INTRAMUSCULAR | Status: DC
  Administered 2023-02-22 – 2023-02-23 (×3): 2 [IU] via SUBCUTANEOUS
  Administered 2023-02-23 (×2): 3 [IU] via SUBCUTANEOUS
  Administered 2023-02-24: 5 [IU] via SUBCUTANEOUS
  Administered 2023-02-24: 2 [IU] via SUBCUTANEOUS
  Administered 2023-02-24: 3 [IU] via SUBCUTANEOUS
  Administered 2023-02-24: 2 [IU] via SUBCUTANEOUS
  Administered 2023-02-24 (×2): 3 [IU] via SUBCUTANEOUS
  Administered 2023-02-25: 5 [IU] via SUBCUTANEOUS
  Administered 2023-02-25 (×2): 8 [IU] via SUBCUTANEOUS
  Administered 2023-02-25 (×2): 3 [IU] via SUBCUTANEOUS
  Administered 2023-02-26: 2 [IU] via SUBCUTANEOUS
  Administered 2023-02-26 (×3): 3 [IU] via SUBCUTANEOUS
  Administered 2023-02-26: 2 [IU] via SUBCUTANEOUS
  Administered 2023-02-27: 3 [IU] via SUBCUTANEOUS
  Administered 2023-02-27 – 2023-03-02 (×6): 2 [IU] via SUBCUTANEOUS

## 2023-02-22 MED ORDER — LACTATED RINGERS IV BOLUS
1000.0000 mL | Freq: Once | INTRAVENOUS | Status: AC
  Administered 2023-02-22: 1000 mL via INTRAVENOUS

## 2023-02-22 MED ORDER — MUPIROCIN 2 % EX OINT
1.0000 | TOPICAL_OINTMENT | Freq: Two times a day (BID) | CUTANEOUS | Status: AC
  Administered 2023-02-22 – 2023-02-26 (×10): 1 via NASAL
  Filled 2023-02-22: qty 22

## 2023-02-22 MED ORDER — PHENYLEPHRINE HCL-NACL 20-0.9 MG/250ML-% IV SOLN
25.0000 ug/min | INTRAVENOUS | Status: DC
  Administered 2023-02-22 – 2023-02-23 (×2): 25 ug/min via INTRAVENOUS
  Administered 2023-02-23 (×2): 85 ug/min via INTRAVENOUS
  Administered 2023-02-24: 95 ug/min via INTRAVENOUS
  Filled 2023-02-22 (×5): qty 250

## 2023-02-22 MED ORDER — FENTANYL CITRATE PF 50 MCG/ML IJ SOSY
50.0000 ug | PREFILLED_SYRINGE | INTRAMUSCULAR | Status: DC | PRN
  Administered 2023-02-22 – 2023-02-26 (×4): 50 ug via INTRAVENOUS
  Administered 2023-02-27: 100 ug via INTRAVENOUS
  Administered 2023-02-27: 50 ug via INTRAVENOUS
  Administered 2023-02-27: 100 ug via INTRAVENOUS
  Filled 2023-02-22 (×4): qty 2
  Filled 2023-02-22 (×3): qty 1
  Filled 2023-02-22: qty 2
  Filled 2023-02-22: qty 1

## 2023-02-22 MED ORDER — SUCCINYLCHOLINE CHLORIDE 200 MG/10ML IV SOSY
100.0000 mg | PREFILLED_SYRINGE | Freq: Once | INTRAVENOUS | Status: AC
  Administered 2023-02-22: 100 mg via INTRAVENOUS

## 2023-02-22 MED ORDER — ACETAMINOPHEN 160 MG/5ML PO SOLN
650.0000 mg | Freq: Four times a day (QID) | ORAL | Status: DC | PRN
  Administered 2023-02-22 – 2023-03-01 (×11): 650 mg
  Filled 2023-02-22 (×10): qty 20.3

## 2023-02-22 MED ORDER — SODIUM CHLORIDE 0.9 % IV SOLN: 250.0000 mL | INTRAVENOUS | Status: DC

## 2023-02-22 MED ORDER — ORAL CARE MOUTH RINSE: 15.0000 mL | OROMUCOSAL | Status: DC | PRN

## 2023-02-22 MED ORDER — LACTULOSE 10 GM/15ML PO SOLN
30.0000 g | Freq: Two times a day (BID) | ORAL | Status: DC
  Administered 2023-02-22: 30 g
  Filled 2023-02-22: qty 45

## 2023-02-22 MED ORDER — PROPOFOL 1000 MG/100ML IV EMUL
INTRAVENOUS | Status: AC
  Administered 2023-02-22: 20 ug/kg/min via INTRAVENOUS
  Filled 2023-02-22: qty 100

## 2023-02-22 MED ORDER — SODIUM CHLORIDE 0.9 % IV BOLUS
1000.0000 mL | Freq: Once | INTRAVENOUS | Status: AC
  Administered 2023-02-22: 1000 mL via INTRAVENOUS

## 2023-02-22 MED ORDER — FENTANYL CITRATE PF 50 MCG/ML IJ SOSY: 50.0000 ug | PREFILLED_SYRINGE | Freq: Once | INTRAMUSCULAR | Status: DC

## 2023-02-22 MED ORDER — PROPOFOL 1000 MG/100ML IV EMUL
5.0000 ug/kg/min | INTRAVENOUS | Status: DC
  Administered 2023-02-22: 50 ug/kg/min via INTRAVENOUS
  Administered 2023-02-22: 60 ug/kg/min via INTRAVENOUS
  Administered 2023-02-22 – 2023-02-23 (×4): 50 ug/kg/min via INTRAVENOUS
  Filled 2023-02-22 (×7): qty 100

## 2023-02-22 MED ORDER — HEPARIN SODIUM (PORCINE) 5000 UNIT/ML IJ SOLN
5000.0000 [IU] | Freq: Three times a day (TID) | INTRAMUSCULAR | Status: DC
  Administered 2023-02-22 – 2023-03-01 (×22): 5000 [IU] via SUBCUTANEOUS
  Filled 2023-02-22 (×20): qty 1

## 2023-02-22 MED ORDER — SODIUM BICARBONATE 8.4 % IV SOLN
INTRAVENOUS | Status: DC
  Filled 2023-02-22: qty 1000
  Filled 2023-02-22: qty 150
  Filled 2023-02-22 (×2): qty 1000

## 2023-02-22 MED ORDER — PROSOURCE TF20 ENFIT COMPATIBL EN LIQD
60.0000 mL | Freq: Every day | ENTERAL | Status: DC
  Administered 2023-02-22 – 2023-03-02 (×9): 60 mL
  Filled 2023-02-22 (×8): qty 60

## 2023-02-22 MED ORDER — FAMOTIDINE 20 MG PO TABS
20.0000 mg | ORAL_TABLET | Freq: Two times a day (BID) | ORAL | Status: DC
  Administered 2023-02-22 – 2023-02-24 (×4): 20 mg
  Filled 2023-02-22 (×4): qty 1

## 2023-02-22 MED ORDER — POLYETHYLENE GLYCOL 3350 17 G PO PACK: 17.0000 g | PACK | Freq: Every day | ORAL | Status: DC | PRN

## 2023-02-22 MED ORDER — LACTATED RINGERS IV SOLN: INTRAVENOUS | Status: DC

## 2023-02-22 MED ORDER — MIDAZOLAM HCL 2 MG/2ML IJ SOLN: 2.0000 mg | Freq: Once | INTRAMUSCULAR | Status: AC

## 2023-02-22 MED ORDER — LEVETIRACETAM 500 MG PO TABS
1000.0000 mg | ORAL_TABLET | Freq: Two times a day (BID) | ORAL | Status: DC
  Administered 2023-02-22 – 2023-02-23 (×3): 1000 mg
  Filled 2023-02-22 (×3): qty 2

## 2023-02-22 MED ORDER — LEVETIRACETAM IN NACL 1000 MG/100ML IV SOLN
1000.0000 mg | Freq: Once | INTRAVENOUS | Status: AC
  Administered 2023-02-22: 1000 mg via INTRAVENOUS
  Filled 2023-02-22: qty 100

## 2023-02-22 MED ORDER — DOCUSATE SODIUM 50 MG/5ML PO LIQD
100.0000 mg | Freq: Two times a day (BID) | ORAL | Status: DC
  Administered 2023-02-22 – 2023-02-28 (×6): 100 mg
  Filled 2023-02-22 (×9): qty 10

## 2023-02-22 MED ORDER — DOCUSATE SODIUM 50 MG/5ML PO LIQD: 100.0000 mg | Freq: Two times a day (BID) | ORAL | Status: DC

## 2023-02-22 MED ORDER — OSMOLITE 1.5 CAL PO LIQD
1000.0000 mL | ORAL | Status: DC
  Administered 2023-02-22 – 2023-03-01 (×7): 1000 mL

## 2023-02-22 MED ORDER — SODIUM CHLORIDE 0.9 % IV SOLN
1.0000 g | INTRAVENOUS | Status: DC
  Administered 2023-02-22 – 2023-02-23 (×2): 1 g via INTRAVENOUS
  Filled 2023-02-22 (×2): qty 10

## 2023-02-22 MED ORDER — MIDAZOLAM HCL 2 MG/2ML IJ SOLN
INTRAMUSCULAR | Status: AC
  Administered 2023-02-22: 2 mg via INTRAVENOUS
  Filled 2023-02-22: qty 2

## 2023-02-22 MED ORDER — INSULIN ASPART 100 UNIT/ML IJ SOLN: 0.0000 [IU] | INTRAMUSCULAR | Status: DC

## 2023-02-22 MED ORDER — ETOMIDATE 2 MG/ML IV SOLN
20.0000 mg | Freq: Once | INTRAVENOUS | Status: AC
  Administered 2023-02-22: 20 mg via INTRAVENOUS

## 2023-02-22 MED ORDER — FENTANYL 2500MCG IN NS 250ML (10MCG/ML) PREMIX INFUSION
0.0000 ug/h | INTRAVENOUS | Status: DC
  Administered 2023-02-22 – 2023-02-23 (×2): 50 ug/h via INTRAVENOUS
  Administered 2023-02-24: 200 ug/h via INTRAVENOUS
  Administered 2023-02-24: 50 ug/h via INTRAVENOUS
  Administered 2023-02-25: 300 ug/h via INTRAVENOUS
  Administered 2023-02-25: 200 ug/h via INTRAVENOUS
  Administered 2023-02-26: 300 ug/h via INTRAVENOUS
  Filled 2023-02-22 (×7): qty 250

## 2023-02-22 NOTE — Procedures (Signed)
Patient Name: Colleen Pearson  MRN: [id number]  Epilepsy Attending: Charlsie Quest  Referring Physician/Provider: Rejeana Brock, MD  Duration: 02/22/2023 0142 to 0700  Patient history: 32 y.o. female with a history of seizure disorder, cocaine use who presents with status epilepticus. EEG to evaluate for seizure  Level of alertness: comatose  AEDs during EEG study: LEV, propofol  Technical aspects: This EEG was obtained using a 10 lead EEG system positioned circumferentially without any parasagittal coverage (rapid EEG). Computer selected EEG is reviewed as  well as background features and all clinically significant events.  Description: EEG showed continuous generalized 3 to 6 Hz theta-delta slowing admixed with 13-15hz  beta activity distributed symmetrically and diffusely. At around 0326 on 02/22/2023, EEG showed rhythmic polymorphic 3-6z theta-delta slowing in left temporal region  which appeared to evolve in morphology and involve right temporal region lasting for about 35 seconds. No video available with rapid EEG. Hyperventilation and photic stimulation were not performed.     Parts of study were difficult to interpret due to significant electrode artifact.   ABNORMALITY - Continuous slow, generalized  IMPRESSION: This study is suggestive of severe diffuse encephalopathy likely related to sedation.  At around 0326 on 02/22/2023, EEG showed rhythmic polymorphic 3-6z theta-delta slowing in left temporal region  which appeared to evolve in morphology and involve right temporal region lasting for about 35 seconds. This pattern was concerning for focal seizure. However, artifact cannot be completley excluded due to lack of video and limited EEG leads.   Please obtain conventional video eeg for further evaluation.   Noah Pelaez [name]

## 2023-02-22 NOTE — Progress Notes (Signed)
RT and RN transported vent patient to CT and back to ICU. Vital signs stable through out.

## 2023-02-22 NOTE — ED Provider Notes (Signed)
  Home EMERGENCY DEPARTMENT AT [facility] Provider Note      Procedures Procedure Name: Intubation Date/Time: 02/22/2023 12:20 AM  Performed by: Roxy Horseman, PA-CPre-anesthesia Checklist: Patient identified, Patient being monitored, Emergency Drugs available, Timeout performed and Suction available Oxygen Delivery Method: Non-rebreather mask Preoxygenation: Pre-oxygenation with 100% oxygen Induction Type: Rapid sequence Ventilation: Mask ventilation without difficulty Laryngoscope Size: Glidescope and 3 Tube size: 7.5 mm Number of attempts: 1 Airway Equipment and Method: Video-laryngoscopy Placement Confirmation: ETT inserted through vocal cords under direct vision, CO2 detector and Breath sounds checked- equal and bilateral Secured at: 22 cm Tube secured with: ETT holder Dental Injury: Teeth and Oropharynx as per pre-operative assessment          Roxy Horseman, PA-C 02/22/23 0021    Gilda Crease, MD 02/22/23 [phone]

## 2023-02-22 NOTE — Progress Notes (Deleted)
Pharmacy Antibiotic Note  Colleen Pearson is a 32 y.o. female admitted on 02/22/2023 with seizures and concern for aspiration pneumonia.  Pharmacy has been consulted for Unasyn dosing.

## 2023-02-22 NOTE — H&P (Addendum)
NAME:  Colleen Pearson, MRN:  [id number], DOB:  [birth year], LOS: 0 ADMISSION DATE:  02/22/2023, CONSULTATION DATE:  9/2 REFERRING MD:  Blinda Leatherwood, CHIEF COMPLAINT:  statis epilepticus    History of Present Illness:   This is a 32 year old female patient with a history of epilepsy, for which she takes Keppra 1000 mg twice daily, last breakthrough seizure noted in June 2024.  Presents to the hospital via EMS after family contacted them for prolonged seizure.  Reportedly seizure lasted for approximately 45 minutes. She was still in status epilepticus on EMS arrival requiring multiple doses of midazolam prehospital.  On initial evaluation by EDP patient was unresponsive, requiring assisted ventilation via mask.  She was intubated loaded with IV Keppra seen by neurology, CT head was negative, additionally received a total of 4 L of crystalloid for intermittent hypotension UDS positive for THC.   Additional ER evaluation demonstrated the following: White blood cell count 18.1, sodium 131, bicarbonate less than 7, glucose 274, creatinine 1.74, lipase 46 beta hydroxybutyric acid was negative at 0.12 initial total CKs 252 urine pregnancy negative initial venous blood gas showed pH of 6.84 pCO2 of 37 and HCO3 of 6.3 her lactate was > 15.   Critical care asked to admit Pertinent  Medical History  Seizure disorder, followed at [facility] Events: Including procedures, antibiotic start and stop dates in addition to other pertinent events   32 year old female patient admitted in status epilepticus intubated in the emergency room, received 4 L of crystalloid for intermittent hypotension.  Loaded with Keppra, , started on ceftriaxone for possible aspiration    Interim History / Subjective:  Sedated on propofol, but currently starting to reach for endotracheal tube   Objective   Blood pressure 118/74, pulse (!) 123, temperature 99.1 F (37.3 C), resp. rate (!) 30, height 5\' 1"  (1.549 m),  weight 62.4 kg, SpO2 100%.    Vent Mode: PRVC FiO2 (%):  [40 %-100 %] 40 % Set Rate:  [14 bmp-30 bmp] 30 bmp Vt Set:  [380 mL] 380 mL PEEP:  [5 cmH20] 5 cmH20 Plateau Pressure:  [19 cmH20] 19 cmH20   Intake/Output Summary (Last 24 hours) at 02/22/2023 0253 Last data filed at 02/22/2023 4098 Gross per 24 hour  Intake 3100 ml  Output --  Net 3100 ml   Filed Weights   02/22/23 0010  Weight: 62.4 kg    Examination: General: Well-nourished 32 year old female, currently sedated on propofol HENT: Normocephalic atraumatic orally intubated pupils pinpoint Lungs: Coarse scattered rhonchi.  Portable chest x-ray personally reviewed.  Endotracheal tube was in satisfactory position there was no obvious infiltrate or consolidation Cardiovascular: Tachycardic regular rhythm Abdomen: Soft not tender Extremities: Multiple tattoos, brisk capillary refill, strong pulses, no edema Neuro: Sedated on propofol, moving the right side a little bit more readily than the left.  Seemingly purposeful reaching for endotracheal tube, but not able to follow commands GU: Due to void  Resolved Hospital Problem list     Assessment & Plan:  Status epilepticus.  Etiology not clear.  She has had breakthrough seizure in the past after missing medications. Plan Keppra as directed by neurology Continuous EEG Sedation with propofol RASS goal -3 until we are sure no further seizure Additional recommendations per neurology   Intubated for airway protection/ventilator management -Currently has a fairly high minute ventilation.  Almost certainly secondary to her severe lactic acidosis after prolonged seizure Plan Continuing full ventilator support, minute ventilation adjusted to try to meet patient's  demand Follow-up serial arterial blood gas, as lactate clears minute ventilation requirements should improve PAD protocol RASS goal -3 VAP bundle Starting IV ceftriaxone to cover for possible aspiration Check respiratory  culture Trend procalcitonin A.m. chest x-ray   Severe anion gap/lactic acidosis and following prolonged refractory seizure Will have received 4 L of crystalloid in the emergency room, beta hydroxybutyric acid was negative.  Think her acidosis is solely from lactic acidosis Plan Ensure mean arterial pressure greater than 65 Treat seizures Trend lactate   AKI after prolonged seizure.  Suspect also element of volume depletion Plan Continuing IV hydration Checking total CKs again, the initial 252 Send urine myoglobin   Hyperglycemia. No recorded history of diabetes.  I wonder if she received D50 at some point Plan Sliding scale insulin Goal glucose 140-180 Check hemoglobin A1c  Elevated ammonia Plan Lactulose   Best Practice (right click and "Reselect all SmartList Selections" daily)   Diet/type: NPO w/ meds via tube DVT prophylaxis: prophylactic heparin  GI prophylaxis: H2B Lines: N/A Foley:  Yes, and it is still needed Code Status:  full code Last date of multidisciplinary goals of care discussion [pending]  Labs   CBC: Recent Labs  Lab 02/22/23 0007 02/22/23 0020 02/22/23 0128  WBC 18.1*  --   --   NEUTROABS 7.2  --   --   HGB 12.0 12.9  12.6 10.2*  HCT 39.2 38.0  37.0 30.0*  MCV 94.7  --   --   PLT 379  --   --     Basic Metabolic Panel: Recent Labs  Lab 02/22/23 0007 02/22/23 0020 02/22/23 0128  NA 131* 134*  135 136  K 4.5 4.5  4.5 3.9  CL 100 105  --   CO2 <7*  --   --   GLUCOSE 274* 261*  --   BUN 17 17  --   CREATININE 1.74* 1.60*  --   CALCIUM 8.9  --   --    GFR: Estimated Creatinine Clearance: 42.7 mL/min (A) (by C-G formula based on SCr of 1.6 mg/dL (H)). Recent Labs  Lab 02/22/23 0007 02/22/23 0020  WBC 18.1*  --   LATICACIDVEN  --  >15.0*    Liver Function Tests: Recent Labs  Lab 02/22/23 0007  AST 35  ALT 21  ALKPHOS 82  BILITOT 0.4  PROT 6.6  ALBUMIN 3.6   Recent Labs  Lab 02/22/23 0007  LIPASE 46   Recent  Labs  Lab 02/22/23 0053  AMMONIA 45*    ABG    Component Value Date/Time   PHART 7.133 (LL) 02/22/2023 0128   PCO2ART 32.1 02/22/2023 0128   PO2ART 492 (H) 02/22/2023 0128   HCO3 10.7 (L) 02/22/2023 0128   TCO2 12 (L) 02/22/2023 0128   ACIDBASEDEF 17.0 (H) 02/22/2023 0128   O2SAT 100 02/22/2023 0128     Coagulation Profile: No results for input(s): "INR", "PROTIME" in the last 168 hours.  Cardiac Enzymes: Recent Labs  Lab 02/22/23 0007  CKTOTAL 252*    HbA1C: No results found for: "HGBA1C"  CBG: No results for input(s): "GLUCAP" in the last 168 hours.  Review of Systems:   Not able   Past Medical History:  She,  has a past medical history of Asthma, Cocaine use complicating pregnancy in third trimester (08/30/2017), Endometriosis, Ovarian cyst, Seizures (HCC), and Status post cesarean section (08/30/2017).   Surgical History:   Past Surgical History:  Procedure Laterality Date   CESAREAN SECTION N/A 08/30/2017  Procedure: CESAREAN SECTION;  Surgeon: Conard Novak, MD;  Location: [facility] ORS;  Service: Obstetrics;  Laterality: N/A;   LASER ABLATION/CAUTERIZATION OF ENDOMETRIAL IMPLANTS       Social History:   reports that she has been smoking cigarettes and e-cigarettes. She has never used smokeless tobacco. She reports that she does not currently use drugs after having used the following drugs: Marijuana and Cocaine. She reports that she does not drink alcohol.   Family History:  Her family history is not on file.   Allergies Allergies  Allergen Reactions   Lamotrigine Hives and Swelling   Penicillins Hives and Swelling   Tramadol Hives and Swelling     Home Medications  Prior to Admission medications   Medication Sig Start Date End Date Taking? Authorizing Provider  levETIRAcetam (KEPPRA) 1000 MG tablet Take 1 tablet (1,000 mg total) by mouth 2 (two) times daily. 06/12/22 02/22/24 Yes Dionne Bucy, MD  etonogestrel (NEXPLANON) 68 MG IMPL  implant 1 each (68 mg total) by Subdermal route once for 1 dose. 11/04/20 11/04/20  [name], CNM     Critical care time: 32 min     Simonne Martinet ACNP-BC [facility] Pager # [phone] OR # [phone] if no answer

## 2023-02-22 NOTE — Progress Notes (Signed)
   02/22/23 1103  Spiritual Encounters  Type of Visit Initial  Care provided to: Pt and family  Conversation partners present during encounter Nurse  Referral source Nurse (RN/NT/LPN)  Reason for visit Urgent spiritual support  OnCall Visit Yes   Responded to call for spiritual presence. Patient intubated and induced sleep due to seizure activity being monitored. Patient's two aunts present. Reviewed family dynamics. Patient has a 32 year old daughter at home. Patient's mother and sister will be visiting later this afternoon. Practiced reflective listening to comfort aunts and provided prayer for patient. Advised family to call chaplain if furter assistance required when other family members arrive.  Aunt was able to make contact with patient's pastor who will follow up with family.

## 2023-02-22 NOTE — Progress Notes (Signed)
PCCM:  32 year old female, past medical history of seizure disorder.  UDS positive for marijuana.  Patient presents to the hospital with approximately 45 minutes of seizure-like activity.  Patient was somnolent acidotic pH 6.8 upon presentation she was also found to be hyperglycemic.  Patient was intubated placed on mechanical support.  EEG started.  Loaded with Keppra seen by neurology.  Pulmonary critical care was asked to admit.  BP 103/61   Pulse (!) 132   Temp 99.4 F (37.4 C)   Resp (!) 30   Ht 5\' 1"  (1.549 m)   Wt 62.4 kg   SpO2 100%   BMI 25.99 kg/m   General: Young female intubated on mechanical life support HEENT: Endotracheal tube in place Heart: Regular rhythm S1-S2 Lungs: Bilateral mechanically ventilated breath sounds with anterior rhonchi  Labs: Reviewed  Assessment: Status epilepticus Known seizure disorder Concern for possible aspiration UDS positive THC. Severe lactic acidosis Anion gap metabolic acidosis  Plan: Admit to the ICU Fluid resuscitation Trend lactate AEDs per neurology Continue EEG LT VV PAD guideline sedation.- propofol  RASS goal -1  This patient is critically ill with multiple organ system failure; which, requires frequent high complexity decision making, assessment, support, evaluation, and titration of therapies. This was completed through the application of advanced monitoring technologies and extensive interpretation of multiple databases. During this encounter critical care time was devoted to patient care services described in this note for 32 minutes.  Full H&P to follow by Anders Simmonds, NP   Josephine Igo, DO [facility] 02/22/2023 2:41 AM

## 2023-02-22 NOTE — ED Provider Notes (Signed)
[place] EMERGENCY DEPARTMENT AT [facility] Provider Note   CSN: [id number] Arrival date & time: 02/22/23  0002     History  Chief Complaint  Patient presents with   Seizures    Colleen Pearson is a 32 y.o. female.  Patient with known history of seizure disorder is brought to the emergency department by ambulance from home.  EMS was called out for prolonged seizure, report that the patient has had seizure for at least 45 minutes continuously.  Patient given multiple doses of Versed prehospital and at arrival breathing is being assisted secondary to sedation and possible continued seizure.  No further information available.       Home Medications Prior to Admission medications   Medication Sig Start Date End Date Taking? Authorizing Provider  Aspirin-Acetaminophen-Caffeine (GOODY HEADACHE PO) Take 1 Package by mouth as needed. Patient not taking: Reported on 11/04/2020    [provider]  etonogestrel (NEXPLANON) 68 MG IMPL implant 1 each (68 mg total) by Subdermal route once for 1 dose. 11/04/20 11/04/20  [name], CNM  levETIRAcetam (KEPPRA) 1000 MG tablet Take 1 tablet (1,000 mg total) by mouth 2 (two) times daily. 06/12/22 08/11/22  Dionne Bucy, MD      Allergies    Lamotrigine, Penicillins, and Tramadol    Review of Systems   Review of Systems  Physical Exam Updated Vital Signs BP 103/61   Pulse (!) 132   Temp 99.4 F (37.4 C)   Resp (!) 30   Ht 5\' 1"  (1.549 m)   Wt 62.4 kg   SpO2 100%   BMI 25.99 kg/m  Physical Exam Constitutional:      General: She is in acute distress.  HENT:     Head: Atraumatic.     Mouth/Throat:     Mouth: Mucous membranes are dry.  Eyes:     Comments: Pupils pinpoint bilaterally  Cardiovascular:     Rate and Rhythm: Regular rhythm. Tachycardia present.     Heart sounds: Normal heart sounds.  Pulmonary:     Effort: Tachypnea present.     Breath sounds: Normal breath sounds and air entry.   Musculoskeletal:        General: No signs of injury.     Cervical back: Neck supple.  Skin:    General: Skin is warm.     Capillary Refill: Capillary refill takes less than 2 seconds.  Neurological:     Mental Status: She is lethargic.     Comments: Patient lethargic, minimally responsive at arrival.  No focal deficits noted.     ED Results / Procedures / Treatments   Labs (all labs ordered are listed, but only abnormal results are displayed) Labs Reviewed  CBC WITH DIFFERENTIAL/PLATELET - Abnormal; Notable for the following components:      Result Value   WBC 18.1 (*)    Lymphs Abs 8.5 (*)    Monocytes Absolute 1.7 (*)    Abs Immature Granulocytes 0.21 (*)    All other components within normal limits  COMPREHENSIVE METABOLIC PANEL - Abnormal; Notable for the following components:   Sodium 131 (*)    CO2 <7 (*)    Glucose, Bld 274 (*)    Creatinine, Ser 1.74 (*)    GFR, Estimated 39 (*)    All other components within normal limits  AMMONIA - Abnormal; Notable for the following components:   Ammonia 45 (*)    All other components within normal limits  URINALYSIS, ROUTINE  W REFLEX MICROSCOPIC - Abnormal; Notable for the following components:   Color, Urine STRAW (*)    Glucose, UA 150 (*)    Hgb urine dipstick MODERATE (*)    Protein, ur 100 (*)    All other components within normal limits  RAPID URINE DRUG SCREEN, HOSP PERFORMED - Abnormal; Notable for the following components:   Benzodiazepines POSITIVE (*)    Tetrahydrocannabinol POSITIVE (*)    All other components within normal limits  CK - Abnormal; Notable for the following components:   Total CK 252 (*)    All other components within normal limits  I-STAT VENOUS BLOOD GAS, ED - Abnormal; Notable for the following components:   pH, Ven 6.838 (*)    pCO2, Ven 36.9 (*)    pO2, Ven 189 (*)    Bicarbonate 6.3 (*)    TCO2 7 (*)    Acid-base deficit 27.0 (*)    All other components within normal limits  I-STAT  CG4 LACTIC ACID, ED - Abnormal; Notable for the following components:   Lactic Acid, Venous >15.0 (*)    All other components within normal limits  I-STAT CHEM 8, ED - Abnormal; Notable for the following components:   Sodium 134 (*)    Creatinine, Ser 1.60 (*)    Glucose, Bld 261 (*)    TCO2 9 (*)    All other components within normal limits  I-STAT ARTERIAL BLOOD GAS, ED - Abnormal; Notable for the following components:   pH, Arterial 7.133 (*)    pO2, Arterial 492 (*)    Bicarbonate 10.7 (*)    TCO2 12 (*)    Acid-base deficit 17.0 (*)    Calcium, Ion 1.14 (*)    HCT 30.0 (*)    Hemoglobin 10.2 (*)    All other components within normal limits  CULTURE, BLOOD (ROUTINE X 2)  CULTURE, BLOOD (ROUTINE X 2)  URINE CULTURE  LIPASE, BLOOD  ETHANOL  BETA-HYDROXYBUTYRIC ACID  HCG, SERUM, QUALITATIVE    EKG EKG Interpretation Date/Time:  Monday February 22 2023 00:05:01 EDT Ventricular Rate:  146 PR Interval:  149 QRS Duration:  80 QT Interval:  261 QTC Calculation: 407 R Axis:   76  Text Interpretation: Sinus tachycardia Consider right atrial enlargement Borderline repolarization abnormality Borderline ST elevation, anterolateral leads Confirmed by Gilda Crease [phone]) on 02/22/2023 12:21:56 AM  Radiology CT HEAD WO CONTRAST ( )  Result Date: 02/22/2023 CLINICAL DATA:  Initial evaluation for mental status change, unknown cause. Seizure. EXAM: CT HEAD WITHOUT CONTRAST TECHNIQUE: Contiguous axial images were obtained from the base of the skull through the vertex without intravenous contrast. RADIATION DOSE REDUCTION: This exam was performed according to the departmental dose-optimization program which includes automated exposure control, adjustment of the mA and/or kV according to patient size and/or use of iterative reconstruction technique. COMPARISON:  Comparison made with prior CT and MRI from 12/17/2022. FINDINGS: Brain: Cerebral volume within normal limits. Symmetric  calcification/mineralization involving the lentiform nuclei bilaterally noted, stable. No acute intracranial hemorrhage. No acute large vessel territory infarct. No mass lesion, midline shift or mass effect. No hydrocephalus or extra-axial fluid collection. Vascular: No abnormal hyperdense vessel. Skull: Scalp soft tissues demonstrate no acute finding. Calvarium intact. Sinuses/Orbits: Globes orbital soft tissues within normal limits. Mild scattered mucoperiosteal thickening present about the ethmoidal air cells and maxillary sinuses. Mastoid air cells are clear. Other: Endotracheal and enteric tubes in place. IMPRESSION: 1. No acute intracranial abnormality. 2. Symmetric calcification/mineralization involving the lentiform nuclei  bilaterally, stable. Electronically Signed   By: Rise Mu M.D.   On: 02/22/2023 01:31   DG Chest Port 1 View  Result Date: 02/22/2023 CLINICAL DATA:  Intubation EXAM: PORTABLE CHEST 1 VIEW COMPARISON:  Radiographs 12/17/2022 FINDINGS: Endotracheal tube tip in the mid intrathoracic trachea 2.6 cm from the carina. Subdiaphragmatic enteric tube. Stable cardiomediastinal silhouette. No focal consolidation, pleural effusion, or pneumothorax. No displaced rib fractures. IMPRESSION: Endotracheal tube tip 2.6 cm from the carina. Electronically Signed   By: Minerva Fester M.D.   On: 02/22/2023 00:33    Procedures Procedures    Medications Ordered in ED Medications  propofol (DIPRIVAN) 1000 MG/100ML infusion (40 mcg/kg/min  62.4 kg Intravenous Rate/Dose Change 02/22/23 0147)  lactated ringers bolus 1,000 mL (has no administration in time range)  etomidate (AMIDATE) injection 20 mg (20 mg Intravenous Given 02/22/23 0012)  succinylcholine (ANECTINE) syringe 100 mg (100 mg Intravenous Given 02/22/23 0013)  levETIRAcetam (KEPPRA) IVPB 1000 mg/100 mL premix (0 mg Intravenous Stopped 02/22/23 0146)  lactated ringers bolus 1,000 mL (1,000 mLs Intravenous New Bag/Given 02/22/23 0029)     Followed by  lactated ringers bolus 1,000 mL (1,000 mLs Intravenous New Bag/Given 02/22/23 0033)  sodium chloride 0.9 % bolus 1,000 mL (0 mLs Intravenous Stopped 02/22/23 0146)  midazolam (VERSED) injection 2 mg (2 mg Intravenous Given 02/22/23 0107)    ED Course/ Medical Decision Making/ A&P                                 Medical Decision Making Amount and/or Complexity of Data Reviewed Independent Historian: EMS External Data Reviewed: notes. Labs: ordered. Decision-making details documented in ED Course. Radiology: ordered and independent interpretation performed. Decision-making details documented in ED Course. ECG/medicine tests: ordered and independent interpretation performed. Decision-making details documented in ED Course.  Risk Prescription drug management.   Differential diagnosis considered includes, but not limited to: TIA; Stroke; ICH; Seizure; electrolyte abnormality; hypoglycemia; toxic/pharmacologic causes; CNS infection; psychiatric disorder  Patient brought to the emergency department by EMS from home with prolonged seizure.  She does have a known seizure disorder.  Not much more information was available by EMS.  Reviewing her prior hospital records does reveal at least a history of substance abuse.  It is not clear if she is compliant with her prescribed Keppra.  At arrival, patient's condition seems to be different than what EMS was seeing during transport.  Patient reportedly became sedated and bradypneic after multiple doses of Versed.  At arrival to the ED she has noted to be clinically dehydrated, increased respiratory rate that appears to be consistent with metabolic acidosis.    Decision was made to intubate as she was not protecting her airway.  EMS did document a blood sugar of 300 and she is not a known diabetic.  New onset diabetes and DKA was considered.  A venous blood gas at arrival reveals a pH of 6.8.  Lactic acid greater than 15.  Beta hydroxybutyric  acid is negative.  Current presentation appears to be severe lactic acidosis secondary to prolonged seizure.  Patient was administered IV Keppra and placed on propofol for sedation and she does not appear to be seizing at this time.  Head CT without abnormality.  Neurology consultation appreciated.  Will be admitted to critical care service.  CRITICAL CARE Performed by: Gilda Crease   Total critical care time: 30 minutes  Critical care time was exclusive of  separately billable procedures and treating other patients.  Critical care was necessary to treat or prevent imminent or life-threatening deterioration.  Critical care was time spent personally by me on the following activities: development of treatment plan with patient and/or surrogate as well as nursing, discussions with consultants, evaluation of patient's response to treatment, examination of patient, obtaining history from patient or surrogate, ordering and performing treatments and interventions, ordering and review of laboratory studies, ordering and review of radiographic studies, pulse oximetry and re-evaluation of patient's condition.         Final Clinical Impression(s) / ED Diagnoses Final diagnoses:  Seizure [facility])    Rx / DC Orders ED Discharge Orders     None         Sricharan Lacomb, Canary Brim, MD 02/22/23 [phone]

## 2023-02-22 NOTE — ED Notes (Signed)
Provider aware of abnormal I-Stats.

## 2023-02-22 NOTE — Progress Notes (Signed)
Cerebel rapid EEG reviewed at the bedside, no ongoing seizures, I will leave to continue monitoring for the time being, she will need formal EEG in the morning.  Ritta Slot, MD [facility] [phone]  If 7pm- 7am, please page neurology on call as listed in AMION.

## 2023-02-22 NOTE — Progress Notes (Addendum)
Same day progress note  S:// Seen and examined. Patient admitted overnight for concern for status epilepticus.  Known history of seizure disorder.  Questionable compliance to antiepileptics. Currently intubated, on sedation with propofol. Hooked up to Ceribell (Rapid EEG) overnight for a limited duration of time.  Change in exam prompted imaging for which ceribell was taken off and remains off after CTA, awaiting conventional EEG lead hook up for LTM  O:// General: Appears well-developed well-nourished HEENT: Normocephalic atraumatic Lungs: Vented Cardiovascular: Regular rhythm Extremities warm well-perfused Neurological exam: Sedated intubated No spontaneous movement Gaze disconjugate Pupils pinpoint minimally reactive No movement to noxious stimulation  Imaging personally reviewed-CT angiography head and neck with no emergent LVO. Rapid EEG read concerning for left temporal slowing which then evolved in morphology and involved right temporal region lasting about 35 seconds concerning for focal seizure.  CBC: WBC 12.0, hemoglobin 10.2, platelet count 196,000. CMP: Sodium 134, potassium 3.5, glucose 120, chloride 110, bicarb 17.  Creatinine 1.58.  Magnesium 2.3. U-Tox: +THC and +Benzo(likely from EMS) Prior Utox 03/23 was  + for amphetamines, cocaine, benzo, THC and cannabinoids.  A&P:// Breakthrough seizure in a patient with known history of seizures that clinically abated with benzos. Rapid EEG concerning for possible underlying focal seizures  Status epilepticus-clinically abated with benzodiazepines-needs further monitoring with video EEG  - Hookup LTM EEG - Keppra 1 g twice daily -Vent management per primary team  Neurology will follow Plan discussed with Dr. Vassie Loll on the unit  - Addendum: Med reconciliation done by pharmacy and patient is on lamictal 150mg  BID despite allergy listed in Epic per Dr. Malvin Johns at [facility]. Last visit October 14, 2022 when dose was  increased to 150mg  BID. Will resume at home dose as last dose was yesterday per family.   Milon Dikes, MD Neurologist [facility] Pager: [phone]  CRITICAL CARE ATTESTATION Performed by: Milon Dikes, MD Total critical care time: 30 minutes Critical care time was exclusive of separately billable procedures and treating other patients and/or supervising APPs/Residents/Students Critical care was necessary to treat or prevent imminent or life-threatening deterioration. This patient is critically ill and at significant risk for neurological worsening and/or death and care requires constant monitoring. Critical care was time spent personally by me on the following activities: development of treatment plan with patient and/or surrogate as well as nursing, discussions with consultants, evaluation of patient's response to treatment, examination of patient, obtaining history from patient or surrogate, ordering and performing treatments and interventions, ordering and review of laboratory studies, ordering and review of radiographic studies, pulse oximetry, re-evaluation of patient's condition, participation in multidisciplinary rounds and medical decision making of high complexity in the care of this patient.

## 2023-02-22 NOTE — Consult Note (Signed)
Neurology Consultation Reason for Consult: Status epilepticus  Referring Physician: Polina, C  CC: Status epilepticus  History is obtained from: Chart review, referring physician  HPI: Colleen Pearson is a 32 y.o. female with a history of seizure disorder, cocaine use who presents with status epilepticus.  She apparently was seizing for about 15 to 20 minutes prior to EMS being called, and on EMS arrival she was given 10 mg of Versed.  She was not protecting her airway on arrival and was intubated.  It is estimated she seized for around 45 minutes.  She is supposed to be taking Keppra at home, unclear if compliant.  UDS is positive only for benzodiazepines which she was given prior to arrival.  Neurology was consulted for concern for possible continuing nonconvulsive seizures.  Past Medical History:  Diagnosis Date   Asthma    Cocaine use complicating pregnancy in third trimester 08/30/2017   Endometriosis    Ovarian cyst    Seizures (HCC)    Status post cesarean section 08/30/2017    No family history on file.   Social History:  reports that she has been smoking cigarettes and e-cigarettes. She has never used smokeless tobacco. She reports that she does not currently use drugs after having used the following drugs: Marijuana and Cocaine. She reports that she does not drink alcohol.   Exam: Current vital signs: BP 103/61   Pulse (!) 132   Temp 99.4 F (37.4 C)   Resp (!) 30   Ht 5\' 1"  (1.549 m)   Wt 62.4 kg   SpO2 100%   BMI 25.99 kg/m  Vital signs in last 24 hours: Temp:  [99.4 F (37.4 C)-100.4 F (38 C)] 99.4 F (37.4 C) (09/02 0145) Pulse Rate:  [126-157] 132 (09/02 0145) Resp:  [30-49] 30 (09/02 0145) BP: (101-131)/(54-86) 103/61 (09/02 0130) SpO2:  [99 %-100 %] 100 % (09/02 0145) FiO2 (%):  [40 %-100 %] 40 % (09/02 0216) Weight:  [62.4 kg] 62.4 kg (09/02 0010)   Physical Exam  Appears well-developed and well-nourished.  She is intubated and on  propofol  Neuro: Mental Status: She is obtunded, does not follow commands Cranial Nerves: II: She does not blink to threat. Pupils are equal, round, and reactive to light.   III,IV, VI: EOM intact to doll's eye, no gaze deviation V:VII: Corneals are intact but weak bilaterally X: Cough is intact Motor: Withdraws to noxious stimulation in all four extremities  sensory: As above  Cerebellar: She does not perform     I have reviewed labs in epic and the results pertinent to this consultation are: pH on arrival was 7.13  I have reviewed the images obtained: CT head-unremarkable  Impression: 31 year old female with a history of seizures who presents with status epilepticus.  Seizures have abated with benzodiazepines, and she has received a dose of Keppra.  We will need to rule out ongoing status epilepticus with rapid EEG and will leave it connected to continue to monitor.    Recommendations: 1) continue Keppra at 1 g twice daily 2) continue EEG monitoring 3) check magnesium   Ritta Slot, MD [facility] [phone]  If 7pm- 7am, please page neurology on call as listed in AMION.

## 2023-02-22 NOTE — ED Triage Notes (Addendum)
Pt BIB EMS for seizure activity for at least 45 minutes. Pt takes keppra unknown if pt has been taking meds. Pt received 10mg  of versed enroute. Pt being bagged with BVM on arrival.

## 2023-02-22 NOTE — Plan of Care (Signed)
  Problem: Pain Managment: Goal: General experience of comfort will improve Outcome: Progressing   Problem: Safety: Goal: Ability to remain free from injury will improve Outcome: Progressing   Problem: Skin Integrity: Goal: Risk for impaired skin integrity will decrease Outcome: Progressing   

## 2023-02-22 NOTE — Progress Notes (Signed)
LTM EEG hooked up and running - no initial skin breakdown - push button tested - [facility] monitoring.  

## 2023-02-22 NOTE — TOC Initial Note (Signed)
Transition of Care [facility]) - Initial/Assessment Note    Patient Details  Name: Colleen Pearson MRN: [id number] Date of Birth: January 12, 1991  Transition of [facility]) CM/SW Contact:    Mearl Latin, LCSW Phone Number: 02/22/2023, 3:16 PM  Clinical Narrative:                 Patient admitted from home with 32 year old child who is currently at home with family members. Patient is currently intubated. Please place [facility] consult if needs arise.     Barriers to Discharge: Continued Medical Work up   Patient Goals and CMS Choice            Expected Discharge Plan and Services       Living arrangements for the past 2 months: Single Family Home                                      Prior Living Arrangements/Services Living arrangements for the past 2 months: Single Family Home Lives with:: Minor Children, Parents Patient language and need for interpreter reviewed:: Yes        Need for Family Participation in Patient Care: Yes (Comment) Care giver support system in place?: Yes (comment)   Criminal Activity/Legal Involvement Pertinent to Current Situation/Hospitalization: No - Comment as needed  Activities of Daily Living      Permission Sought/Granted                  Emotional Assessment   Attitude/Demeanor/Rapport: Unable to Assess Affect (typically observed): Unable to Assess Orientation: :  (Intubated) Alcohol / Substance Use: Not Applicable Psych Involvement: No (comment)  Admission diagnosis:  Status epilepticus (HCC) [G40.901] Seizure (HCC) [R56.9] Patient Active Problem List   Diagnosis Date Noted   Status epilepticus (HCC) 02/22/2023   Nexplanon in place 11/04/2020   Family history of Down syndrome    PCP:  Morene Crocker, MD Pharmacy:   [facility] - [place], [place] - [address] [place] [address] Phone: [phone] Fax: [phone]  [facility] [address] [place]), [place] - [address] [place]) [place] [address] Phone: [phone] Fax: [phone]     Social Determinants of Health (SDOH) Social History: SDOH Screenings   Tobacco Use: High Risk (12/17/2022)   SDOH Interventions:     Readmission Risk Interventions     No data to display

## 2023-02-22 NOTE — Progress Notes (Signed)
Pt transported to 4N23. No complications noted

## 2023-02-22 NOTE — Progress Notes (Signed)
Pt transported to CT and back without complications. 

## 2023-02-22 NOTE — Progress Notes (Signed)
NAME:  Colleen Pearson, MRN:  [id number], DOB:  Nov 03, 1990, LOS: 0 ADMISSION DATE:  02/22/2023, CONSULTATION DATE:  9/2 REFERRING MD:  Blinda Leatherwood, CHIEF COMPLAINT:  statis epilepticus    History of Present Illness:   This is a 32 year old female patient with a history of epilepsy, for which she takes Keppra 1000 mg twice daily, last breakthrough seizure noted in June 2024.  Presents to the hospital via EMS after family contacted them for prolonged seizure.  Reportedly seizure lasted for approximately 45 minutes. She was still in status epilepticus on EMS arrival requiring multiple doses of midazolam prehospital.  On initial evaluation by EDP patient was unresponsive, requiring assisted ventilation via mask.  She was intubated loaded with IV Keppra seen by neurology, CT head was negative, additionally received a total of 4 L of crystalloid for intermittent hypotension UDS positive for THC.   Additional ER evaluation demonstrated the following: White blood cell count 18.1, sodium 131, bicarbonate less than 7, glucose 274, creatinine 1.74, lipase 46 beta hydroxybutyric acid was negative at 0.12 initial total CKs 252 urine pregnancy negative initial venous blood gas showed pH of 6.84 pCO2 of 37 and HCO3 of 6.3 her lactate was > 15.   Critical care asked to admit Pertinent  Medical History  Seizure disorder, followed at [facility] Events: Including procedures, antibiotic start and stop dates in addition to other pertinent events   32 year old female patient admitted in status epilepticus intubated in the emergency room, received 4 L of crystalloid for intermittent hypotension.  Loaded with Keppra, , started on ceftriaxone for possible aspiration 9/2 ceribell EEG suggesting focal seizure episode at 03 26  Interim History / Subjective:   Critically ill, sedated on propofol, fentanyl added overnight. Tachycardic, febrile 39 Good urine output   Objective   Blood pressure 102/71,  pulse (!) 119, temperature (!) 102.2 F (39 C), resp. rate 17, height 5\' 1"  (1.549 m), weight 66.4 kg, SpO2 100%.    Vent Mode: PRVC FiO2 (%):  [40 %-100 %] 40 % Set Rate:  [14 bmp-30 bmp] 20 bmp Vt Set:  [380 mL] 380 mL PEEP:  [5 cmH20] 5 cmH20 Plateau Pressure:  [19 cmH20] 19 cmH20   Intake/Output Summary (Last 24 hours) at 02/22/2023 0830 Last data filed at 02/22/2023 0800 Gross per 24 hour  Intake 4715.75 ml  Output 1725 ml  Net 2990.75 ml   Filed Weights   02/22/23 0010 02/22/23 0437  Weight: 62.4 kg 66.4 kg    Examination: General: Well-nourished  sedated on propofol and fentanyl, orally intubated HENT: Normocephalic atraumatic orally intubated pupils pinpoint Lungs: Bilateral ventilated breath sounds, no accessory muscle use Cardiovascular: Tachycardic regular rhythm Abdomen: Soft not tender Extremities: Multiple tattoos, brisk capillary refill, strong pulses, no edema Neuro: RASS -3 on propofol and fentanyl GU: Clear urine  ABG shows metabolic acidosis. Labs show mild hyponatremia, BUN/creatinine 15/1.6, CK3 583 , decreased leukocytosis, rising procalcitonin  Resolved Hospital Problem list     Assessment & Plan:  Status epilepticus.  Etiology not clear.   Known seizure disorder -she has had breakthrough seizure in the past after missing medications.  Clinically resolved with benzodiazepines CTA -no CVA or LVO Plan Keppra , neurology following Rapid EEG concerning for focal seizure , changing to LTM EEG Sedation with propofol RASS goal -3 until we are sure no further seizure  Acute respiratory failure Intubated for airway protection/ventilator management - Almost certainly secondary to her severe lactic acidosis after prolonged seizure Plan  Continuing full ventilator support, lower RR to 20 now that acidosis resolving VAP bundle Continue IV ceftriaxone to cover for possible aspiration Await respiratory culture , procalcitonin rising  Hypotension -mostly  related to propofol Add peripheral Neo-Synephrine    Severe anion gap/lactic acidosis and following prolonged refractory seizure , improving Will have received 4 L of crystalloid in the emergency room, beta hydroxybutyric acid was negative.  Think her acidosis is solely from lactic acidosis which is resolving Plan Repeat ABG and lactate to ensure they are resolving   AKI after prolonged seizure.   Mild rhabdomyolysis Plan Continuing IV hydration Trend CK   Hyperglycemia. No recorded history of diabetes.  I wonder if she received D50 at some point Plan DC SSI Goal glucose 140-180 Check hemoglobin A1c  Mildly elevated ammonia Plan Hold lactulose for now  Best Practice (right click and "Reselect all SmartList Selections" daily)   Diet/type: NPO w/ meds via tube DVT prophylaxis: prophylactic heparin  GI prophylaxis: H2B Lines: N/A Foley:  Yes, and it is still needed Code Status:  full code Last date of multidisciplinary goals of care discussion [pending]  Labs   CBC: Recent Labs  Lab 02/22/23 0007 02/22/23 0020 02/22/23 0128 02/22/23 0245 02/22/23 0413 02/22/23 0553  WBC 18.1*  --   --  13.8* 12.0*  --   NEUTROABS 7.2  --   --   --   --   --   HGB 12.0 12.9  12.6 10.2* 11.2* 11.8* 10.2*  HCT 39.2 38.0  37.0 30.0* 34.2* 34.9* 30.0*  MCV 94.7  --   --  86.8 86.6  --   PLT 379  --   --  223 196  --     Basic Metabolic Panel: Recent Labs  Lab 02/22/23 0007 02/22/23 0020 02/22/23 0128 02/22/23 0245 02/22/23 0413 02/22/23 0553  NA 131* 134*  135 136  --  134* 135  K 4.5 4.5  4.5 3.9  --  3.5 3.8  CL 100 105  --   --  110  --   CO2 <7*  --   --   --  17*  --   GLUCOSE 274* 261*  --   --  120*  --   BUN 17 17  --   --  15  --   CREATININE 1.74* 1.60*  --  1.40* 1.58*  --   CALCIUM 8.9  --   --   --  7.4*  --   MG  --   --   --   --  2.3  --    GFR: Estimated Creatinine Clearance: 44.5 mL/min (A) (by C-G formula based on SCr of 1.58 mg/dL (H)). Recent  Labs  Lab 02/22/23 0007 02/22/23 0020 02/22/23 0245 02/22/23 0251 02/22/23 0413  PROCALCITON  --   --  0.74  --  4.20  WBC 18.1*  --  13.8*  --  12.0*  LATICACIDVEN  --  >15.0*  --  6.3*  --     Liver Function Tests: Recent Labs  Lab 02/22/23 0007 02/22/23 0413  AST 35 80*  ALT 21 36  ALKPHOS 82 62  BILITOT 0.4 0.5  PROT 6.6 5.8*  ALBUMIN 3.6 3.1*   Recent Labs  Lab 02/22/23 0007  LIPASE 46   Recent Labs  Lab 02/22/23 0053  AMMONIA 45*    ABG    Component Value Date/Time   PHART 7.274 (L) 02/22/2023 0553   PCO2ART 34.3 02/22/2023 0553  PO2ART 77 (L) 02/22/2023 0553   HCO3 15.5 (L) 02/22/2023 0553   TCO2 16 (L) 02/22/2023 0553   ACIDBASEDEF 10.0 (H) 02/22/2023 0553   O2SAT 91 02/22/2023 0553     Coagulation Profile: No results for input(s): "INR", "PROTIME" in the last 168 hours.  Cardiac Enzymes: Recent Labs  Lab 02/22/23 0007 02/22/23 0413  CKTOTAL 252* 3,583*    HbA1C: Hgb A1c MFr Bld  Date/Time Value Ref Range Status  02/22/2023 02:45 AM 5.0 4.8 - 5.6 % Final    Comment:    (NOTE) Pre diabetes:          5.7%-6.4%  Diabetes:              >6.4%  Glycemic control for   <7.0% adults with diabetes     CBG: Recent Labs  Lab 02/22/23 0358  GLUCAP 117*    Critical care time: 35 min     Cyril Mourning MD. FCCP. [facility] Pager : [phone]  If no response to pager , please call [phone] until 7 pm After 7:00 pm call Elink  [phone]   02/22/2023

## 2023-02-22 NOTE — Progress Notes (Signed)
[facility] ADULT ICU REPLACEMENT PROTOCOL   The patient does apply for the [facility] Adult ICU Electrolyte Replacment Protocol based on the criteria listed below:   1.Exclusion criteria: TCTS, ECMO, Dialysis, and Myasthenia Gravis patients 2. Is GFR >/= 30 ml/min? Yes.    Patient's GFR today is 44 3. Is SCr </= 2? Yes.   Patient's SCr is 1.58 mg/dL 4. Did SCr increase >/= 0.5 in 24 hours? No. 5.Pt's weight >40kg  Yes.   6. Abnormal electrolyte(s): K  7. Electrolytes replaced per protocol 8.  Call MD STAT for K+ </= 2.5, Phos </= 1, or Mag </= 1 Physician:  Arnette Schaumann [facility] 02/22/2023 5:26 AM

## 2023-02-22 NOTE — Progress Notes (Signed)
RT obtained ABG @1122 , ABG as follows: 7.23/35.1/77/14.4. RT will continue to monitor.

## 2023-02-22 NOTE — Progress Notes (Addendum)
eLink Physician-Brief Progress Note Patient Name: Colleen Pearson DOB: Aug 22, 1990 MRN: [id number]   Date of Service  02/22/2023  HPI/Events of Note  40F with hx epilepsy admitted for SE. Intubated for airway protection. CT head negative.  Labs significant for WBC 18, Na 131, bicarb <7, Cr 1.74. Glucose 274 with normal BHA. VBG significant for metabolic acidosis. LA >15, improved to 6.3. Received 4L IVF  On MV, spontaneous movements, not following commands.  eICU Interventions  EEG in place  Full vent support PAD protocol: Start continuous fentanyl Maintenance fluids Trend LA. Likely d/t seizures PRN tylenol for fever    >>Cooling blanket orderd Post- intubation CXR pending>>ETT adequate position     Intervention Category Evaluation Type: New Patient Evaluation  Erhard Senske Mechele Collin 02/22/2023, 3:35 AM

## 2023-02-22 NOTE — Progress Notes (Signed)
Upon pt arrival to the unit, the pt was febrile. Contacted e-link for PRN tylenol. Awaited OG placement verification. In the meantime, applied ice packs and contacted e-link for cooling blanket. Per portable, there are none available and one will be delivered when available. Continued to assess and monitor the pt.   Upon pt assessment, the pt began what appeared to be posturing. Everett Graff, MD. He arrived and assessed the pt and ordered a STAT CT Angio. No new orders at this time, continuing to monitor and assess the pt and will notify of any changes.   Continuous EEG is ordered for this pt. They were contacted multiple times with no reply. Informed by Amada Jupiter, MD that they would be by around 0700. Continuing to monitor and assess the pt.

## 2023-02-22 NOTE — Progress Notes (Signed)
Initial Nutrition Assessment  DOCUMENTATION CODES:   Not applicable  INTERVENTION:   Initiate tube feeding via OG tube: Osmolite 1.5 at 55 ml/h (1320 ml per day) Prosource TF20 60 ml daily  Provides 2060 kcal, 102 gm protein, 1003 ml free water daily   NUTRITION DIAGNOSIS:   Inadequate oral intake related to inability to eat as evidenced by NPO status.  GOAL:   Patient will meet greater than or equal to 90% of their needs  MONITOR:   TF tolerance  REASON FOR ASSESSMENT:   Consult Enteral/tube feeding initiation and management  ASSESSMENT:   Pt with PMH of epilepsy with last breakthrough sz 11/2022 admitted in status epilepticus.   Pt discussed during ICU rounds and with RN.  Pt intubated, no family present. Started LTM this am.  Ok with MD to start nutrition.    Medications reviewed and include: colace, pepcid, heparin, SSI, keppra, miralax Fentanyl  LR @ 125 ml/hr Propofol @ 18 ml/hr provides: 475 kcal   Labs reviewed  40  F OG tube; gastric per xray   NUTRITION - FOCUSED PHYSICAL EXAM:  WNL  Diet Order:   Diet Order             Diet NPO time specified  Diet effective now                   EDUCATION NEEDS:   Not appropriate for education at this time  Skin:  Skin Assessment: Reviewed RN Assessment  Last BM:  unknown  Height:   Ht Readings from Last 1 Encounters:  02/22/23 5\' 1"  (1.549 m)    Weight:   Wt Readings from Last 1 Encounters:  02/22/23 66.4 kg    BMI:  Body mass index is 27.66 kg/m.  Estimated Nutritional Needs:   Kcal:  2000-2200  Protein:  90-105 grams  Fluid:  >2 L/day  Cammy Copa., RD, LDN, CNSC See AMiON for contact information

## 2023-02-23 ENCOUNTER — Other Ambulatory Visit: Payer: Self-pay

## 2023-02-23 ENCOUNTER — Inpatient Hospital Stay (HOSPITAL_COMMUNITY): Payer: Medicaid Other

## 2023-02-23 DIAGNOSIS — J9601 Acute respiratory failure with hypoxia: Secondary | ICD-10-CM

## 2023-02-23 DIAGNOSIS — G40901 Epilepsy, unspecified, not intractable, with status epilepticus: Secondary | ICD-10-CM | POA: Diagnosis not present

## 2023-02-23 LAB — BASIC METABOLIC PANEL
Anion gap: 8 (ref 5–15)
BUN: 17 mg/dL (ref 6–20)
CO2: 23 mmol/L (ref 22–32)
Calcium: 7.1 mg/dL — ABNORMAL LOW (ref 8.9–10.3)
Chloride: 108 mmol/L (ref 98–111)
Creatinine, Ser: 1.64 mg/dL — ABNORMAL HIGH (ref 0.44–1.00)
GFR, Estimated: 42 mL/min — ABNORMAL LOW (ref >60)
Glucose, Bld: 162 mg/dL — ABNORMAL HIGH (ref 70–99)
Potassium: 3.6 mmol/L (ref 3.5–5.1)
Sodium: 139 mmol/L (ref 135–145)

## 2023-02-23 LAB — URINE CULTURE: Culture: NO GROWTH

## 2023-02-23 LAB — CBC WITH DIFFERENTIAL/PLATELET
Abs Immature Granulocytes: 0.11 10*3/uL — ABNORMAL HIGH (ref 0.00–0.07)
Basophils Absolute: 0.1 10*3/uL (ref 0.0–0.1)
Basophils Relative: 1 %
Eosinophils Absolute: 0 10*3/uL (ref 0.0–0.5)
Eosinophils Relative: 0 %
HCT: 32.7 % — ABNORMAL LOW (ref 36.0–46.0)
Hemoglobin: 11.1 g/dL — ABNORMAL LOW (ref 12.0–15.0)
Immature Granulocytes: 1 %
Lymphocytes Relative: 5 %
Lymphs Abs: 0.9 10*3/uL (ref 0.7–4.0)
MCH: 29.3 pg (ref 26.0–34.0)
MCHC: 33.9 g/dL (ref 30.0–36.0)
MCV: 86.3 fL (ref 80.0–100.0)
Monocytes Absolute: 0.9 10*3/uL (ref 0.1–1.0)
Monocytes Relative: 5 %
Neutro Abs: 16.3 10*3/uL — ABNORMAL HIGH (ref 1.7–7.7)
Neutrophils Relative %: 88 %
Platelets: 175 10*3/uL (ref 150–400)
RBC: 3.79 MIL/uL — ABNORMAL LOW (ref 3.87–5.11)
RDW: 13.2 % (ref 11.5–15.5)
WBC: 18.4 10*3/uL — ABNORMAL HIGH (ref 4.0–10.5)
nRBC: 0.2 % (ref 0.0–0.2)

## 2023-02-23 LAB — GLUCOSE, CAPILLARY
Glucose-Capillary: 133 mg/dL — ABNORMAL HIGH (ref 70–99)
Glucose-Capillary: 176 mg/dL — ABNORMAL HIGH (ref 70–99)
Glucose-Capillary: 131 mg/dL — ABNORMAL HIGH (ref 70–99)
Glucose-Capillary: 151 mg/dL — ABNORMAL HIGH (ref 70–99)
Glucose-Capillary: 99 mg/dL (ref 70–99)
Glucose-Capillary: 104 mg/dL — ABNORMAL HIGH (ref 70–99)

## 2023-02-23 LAB — PHOSPHORUS: Phosphorus: 3 mg/dL (ref 2.5–4.6)

## 2023-02-23 LAB — CK: Total CK: 15317 U/L — ABNORMAL HIGH (ref 38–234)

## 2023-02-23 LAB — MAGNESIUM: Magnesium: 2.3 mg/dL (ref 1.7–2.4)

## 2023-02-23 MED ORDER — LEVETIRACETAM IN NACL 1000 MG/100ML IV SOLN
1000.0000 mg | Freq: Two times a day (BID) | INTRAVENOUS | Status: DC
  Administered 2023-02-23: 1000 mg via INTRAVENOUS
  Filled 2023-02-23: qty 100

## 2023-02-23 MED ORDER — LORAZEPAM 2 MG/ML PO CONC
2.0000 mg | Freq: Once | ORAL | Status: DC
Start: 2023-02-23 — End: 2023-02-23

## 2023-02-23 MED ORDER — KETAMINE HCL 50 MG/5ML IJ SOSY
PREFILLED_SYRINGE | INTRAMUSCULAR | Status: AC
  Filled 2023-02-23: qty 10

## 2023-02-23 MED ORDER — ORAL CARE MOUTH RINSE: 15.0000 mL | OROMUCOSAL | Status: DC

## 2023-02-23 MED ORDER — DEXMEDETOMIDINE HCL IN NACL 400 MCG/100ML IV SOLN
0.0000 ug/kg/h | INTRAVENOUS | Status: DC
  Administered 2023-02-23: 1 ug/kg/h via INTRAVENOUS
  Administered 2023-02-23: 0.4 ug/kg/h via INTRAVENOUS
  Administered 2023-02-24: 0.9 ug/kg/h via INTRAVENOUS
  Administered 2023-02-24 – 2023-02-25 (×5): 1.2 ug/kg/h via INTRAVENOUS
  Administered 2023-02-25 (×2): 1.1 ug/kg/h via INTRAVENOUS
  Administered 2023-02-26 (×2): 1.8 ug/kg/h via INTRAVENOUS
  Administered 2023-02-26: 1.2 ug/kg/h via INTRAVENOUS
  Administered 2023-02-27: 1 ug/kg/h via INTRAVENOUS
  Administered 2023-02-28: 1.4 ug/kg/h via INTRAVENOUS
  Administered 2023-02-28: 1.8 ug/kg/h via INTRAVENOUS
  Filled 2023-02-23 (×2): qty 200
  Filled 2023-02-23 (×10): qty 100
  Filled 2023-02-23: qty 200
  Filled 2023-02-23: qty 100
  Filled 2023-02-23: qty 200
  Filled 2023-02-23 (×9): qty 100
  Filled 2023-02-23: qty 200

## 2023-02-23 MED ORDER — ORAL CARE MOUTH RINSE: 15.0000 mL | OROMUCOSAL | Status: DC | PRN

## 2023-02-23 MED ORDER — SODIUM CHLORIDE 0.9 % IV SOLN
150.0000 mg | Freq: Two times a day (BID) | INTRAVENOUS | Status: DC
  Filled 2023-02-23: qty 15

## 2023-02-23 MED ORDER — DEXMEDETOMIDINE HCL IN NACL 400 MCG/100ML IV SOLN
INTRAVENOUS | Status: AC
  Filled 2023-02-23: qty 100

## 2023-02-23 MED ORDER — FUROSEMIDE 10 MG/ML IJ SOLN
40.0000 mg | Freq: Once | INTRAMUSCULAR | Status: AC
  Administered 2023-02-23: 40 mg via INTRAVENOUS
  Filled 2023-02-23: qty 4

## 2023-02-23 MED ORDER — LORAZEPAM 2 MG/ML IJ SOLN
1.0000 mg | INTRAMUSCULAR | Status: DC | PRN
  Administered 2023-02-23 – 2023-02-27 (×14): 1 mg via INTRAVENOUS
  Filled 2023-02-23 (×15): qty 1

## 2023-02-23 MED ORDER — LORAZEPAM 2 MG/ML IJ SOLN: 2.0000 mg | Freq: Once | INTRAMUSCULAR | Status: AC

## 2023-02-23 MED ORDER — MIDAZOLAM HCL 2 MG/2ML IJ SOLN
INTRAMUSCULAR | Status: AC
  Administered 2023-02-23: 2 mg
  Filled 2023-02-23: qty 2

## 2023-02-23 MED ORDER — ETOMIDATE 2 MG/ML IV SOLN
INTRAVENOUS | Status: AC
  Filled 2023-02-23: qty 20

## 2023-02-23 MED ORDER — SODIUM CHLORIDE 0.9 % IV SOLN
2.0000 g | INTRAVENOUS | Status: DC
  Administered 2023-02-24: 2 g via INTRAVENOUS
  Filled 2023-02-23: qty 20

## 2023-02-23 MED ORDER — PHENOBARBITAL SODIUM 65 MG/ML IJ SOLN: 32.5000 mg | Freq: Three times a day (TID) | INTRAMUSCULAR | Status: DC

## 2023-02-23 MED ORDER — ROCURONIUM BROMIDE 10 MG/ML (PF) SYRINGE
PREFILLED_SYRINGE | INTRAVENOUS | Status: AC
  Filled 2023-02-23: qty 10

## 2023-02-23 MED ORDER — FENTANYL CITRATE PF 50 MCG/ML IJ SOSY
PREFILLED_SYRINGE | INTRAMUSCULAR | Status: AC
  Filled 2023-02-23: qty 2

## 2023-02-23 MED ORDER — PHENOBARBITAL SODIUM 65 MG/ML IJ SOLN: 65.0000 mg | Freq: Three times a day (TID) | INTRAMUSCULAR | Status: DC

## 2023-02-23 MED ORDER — SUCCINYLCHOLINE CHLORIDE 200 MG/10ML IV SOSY
PREFILLED_SYRINGE | INTRAVENOUS | Status: AC
  Filled 2023-02-23: qty 10

## 2023-02-23 MED ORDER — SODIUM CHLORIDE 0.9 % IV SOLN
260.0000 mg | Freq: Once | INTRAVENOUS | Status: AC
  Administered 2023-02-23: 260 mg via INTRAVENOUS
  Filled 2023-02-23: qty 2

## 2023-02-23 MED ORDER — PHENOBARBITAL SODIUM 130 MG/ML IJ SOLN
97.5000 mg | Freq: Three times a day (TID) | INTRAMUSCULAR | Status: DC
  Administered 2023-02-23 – 2023-02-24 (×2): 97.5 mg via INTRAVENOUS
  Filled 2023-02-23 (×2): qty 1

## 2023-02-23 MED ORDER — LORAZEPAM 2 MG/ML IJ SOLN
INTRAMUSCULAR | Status: AC
  Administered 2023-02-23: 2 mg
  Filled 2023-02-23: qty 1

## 2023-02-23 NOTE — Progress Notes (Signed)
Neurology Progress Note  Brief HPI:  Colleen Pearson is a 32 y.o. female with a past medical history of epilepsy on Keppra 1000 mg twice daily and Lamictal 150 mg twice daily.  She currently follows with Dr. Malvin Johns at the [facility].  She was brought to the hospital via EMS after family called due to a prolonged seizure.  The seizure reportedly lasted for approximately 45 minutes and she was still in status on arrival to the hospital.  She was intubated, loaded with Keppra and she is now on LTM.  She is currently on high-dose propofol for sedation, plan to titrate this down on 9/3.  Med reconciliation done by pharmacy and patient is on lamictal 150mg  BID despite allergy listed in Epic per Dr. Malvin Johns at [facility]. Last visit October 14, 2022 when dose was increased to 150mg  BID.   Current AEDs: Keppra 1000 mg twice daily Lamictal 150 mg twice daily  Subjective: Patient admitted 9/2 for concern for status epilepticus.  Known history of seizure disorder.  Questionable compliance to antiepileptics. Currently intubated, on sedation with propofol. Nursing to decrease propofol via every hour  Exam: Vitals:   02/23/23 0700 02/23/23 0804  BP: 100/67   Pulse: (!) 110   Resp: 18   Temp:  99.1 F (37.3 C)  SpO2: 99% 99%   Gen: In bed, NAD. Propofol at 27mcg/kg Resp: remains intubated Abd: soft, nt  Neuro: Mental Status: Intubated, sedated Cranial Nerves: II: PERRL.  No blink to threat III,IV, VI: Eyes dysconjugate X: Cough and gag intact XI: Head is midline Motor/sensory: Tone is normal. Bulk is normal.  No spontaneous or purposeful movement Withdraws in bilateral upper extremities  Flexion to noxious stimuli in right lower Extension to noxious stimuli in left lower Cerebellar: Unable to complete Gait: steady or deferred for safety    Pertinent Labs: CBC: WBC 12.0, hemoglobin 10.2, platelet count 196,000. CMP: Sodium 134, potassium 3.5, glucose 120, chloride  110, bicarb 17.  Creatinine 1.58.  Magnesium 2.3. U-Tox: +THC and +Benzo(likely from EMS) Prior Utox 03/23 was  + for amphetamines, cocaine, benzo, THC and cannabinoids.    Imaging Reviewed:  LTM EEG: 02/22/2023 0822 to 02/23/2023 6045  This study is suggestive of severe diffuse encephalopathy likely related to sedation. No seizures or epileptiform discharges were seen throughout the recording.   Assessment:  32 year old female found to be in status epilepticus by family.  Status broken with benzos and Keppra load.  Currently on Keppra 1000 mg twice daily and Lamictal 150 mg twice daily.  Urine tox positive for THC.  She is currently intubated in the ICU and on propofol for sedation.  Per CCM plan to titrate down sedation today.   Impression:  Status epilepticus - treated with benzodiazepines Breakthrough seizure caused by [facility] use  Recommendations: - Titrate down continuous sedation, wean per CCM - Maintain LTM today - Continue Keppra 1000mg  BID and Lamictal 150mg  BID - Will need close outpatient follow up with her OP Neuro team  - Seizure precautions  Seizure precautions: Per [facility] statutes, patients with seizures are not allowed to drive until they have been seizure-free for six months and cleared by a physician. Use caution when using heavy equipment or power tools. Avoid working on ladders or at heights. Take showers instead of baths. Ensure the water temperature is not too high on the home water heater. Do not go swimming alone. Do not lock yourself in a room alone (i.e. bathroom). When caring  for infants or small children, sit down when holding, feeding, or changing them to minimize risk of injury to the child in the event you have a seizure. Maintain good sleep hygiene. Avoid alcohol.     Patient seen and examined by NP/APP with MD. MD to update note as needed.   Elmer Picker, DNP, FNP-BC [facility] Pager: [phone]   Attending Neurohospitalist  Addendum Patient seen and examined with APP/Resident. Agree with the history and physical as documented above. Agree with the plan as documented, which I helped formulate. I have independently reviewed the chart, obtained history, review of systems and examined the patient.I have personally reviewed pertinent head/neck/spine imaging (CT/MRI). Please feel free to call with any questions. Plan discussed with the care team including Dr. Merrily Pew from PCCM  -- Milon Dikes, MD Neurologist [facility] Pager: [phone]  CRITICAL CARE ATTESTATION Performed by: Milon Dikes, MD Total critical care time: 36 minutes Critical care time was exclusive of separately billable procedures and treating other patients and/or supervising APPs/Residents/Students Critical care was necessary to treat or prevent imminent or life-threatening deterioration. This patient is critically ill and at significant risk for neurological worsening and/or death and care requires constant monitoring. Critical care was time spent personally by me on the following activities: development of treatment plan with patient and/or surrogate as well as nursing, discussions with consultants, evaluation of patient's response to treatment, examination of patient, obtaining history from patient or surrogate, ordering and performing treatments and interventions, ordering and review of laboratory studies, ordering and review of radiographic studies, pulse oximetry, re-evaluation of patient's condition, participation in multidisciplinary rounds and medical decision making of high complexity in the care of this patient.

## 2023-02-23 NOTE — Progress Notes (Signed)
NAME:  Colleen Pearson, MRN:  [id number], DOB:  05-29-1991, LOS: 1 ADMISSION DATE:  02/22/2023, CONSULTATION DATE:  9/2 REFERRING MD:  Blinda Leatherwood, CHIEF COMPLAINT:  statis epilepticus    History of Present Illness:   This is a 32 year old female patient with a history of epilepsy, for which she takes Keppra 1000 mg twice daily, last breakthrough seizure noted in June 2024.  Presents to the hospital via EMS after family contacted them for prolonged seizure.  Reportedly seizure lasted for approximately 45 minutes. She was still in status epilepticus on EMS arrival requiring multiple doses of midazolam prehospital.  On initial evaluation by EDP patient was unresponsive, requiring assisted ventilation via mask.  She was intubated loaded with IV Keppra seen by neurology, CT head was negative, additionally received a total of 4 L of crystalloid for intermittent hypotension UDS positive for THC.   Additional ER evaluation demonstrated the following: White blood cell count 18.1, sodium 131, bicarbonate less than 7, glucose 274, creatinine 1.74, lipase 46 beta hydroxybutyric acid was negative at 0.12 initial total CKs 252 urine pregnancy negative initial venous blood gas showed pH of 6.84 pCO2 of 37 and HCO3 of 6.3 her lactate was > 15.   Critical care asked to admit Pertinent  Medical History  Seizure disorder, followed at [facility] Events: Including procedures, antibiotic start and stop dates in addition to other pertinent events   32 year old female patient admitted in status epilepticus intubated in the emergency room, received 4 L of crystalloid for intermittent hypotension.  Loaded with Keppra, , started on ceftriaxone for possible aspiration 9/2 ceribell EEG suggesting focal seizure episode at 03 26  Interim History / Subjective:  Still sedated with propofol Became afebrile EEG is negative for active seizures  Objective   Blood pressure 100/67, pulse (!) 110, temperature  100.3 F (37.9 C), temperature source Bladder, resp. rate 18, height 5\' 1"  (1.549 m), weight 66.5 kg, SpO2 99%.    Vent Mode: PRVC FiO2 (%):  [40 %-50 %] 40 % Set Rate:  [20 bmp] 20 bmp Vt Set:  [380 mL] 380 mL PEEP:  [5 cmH20] 5 cmH20 Plateau Pressure:  [11 cmH20-15 cmH20] 15 cmH20   Intake/Output Summary (Last 24 hours) at 02/23/2023 0813 Last data filed at 02/23/2023 0700 Gross per 24 hour  Intake 3929.84 ml  Output 3525 ml  Net 404.84 ml   Filed Weights   02/22/23 0010 02/22/23 0437 02/23/23 0500  Weight: 62.4 kg 66.4 kg 66.5 kg    Examination: General: Crtitically ill-appearing young female, orally intubated HEENT: [place]/AT, eyes anicteric.  ETT and OGT in place Neuro: Sedated, not following commands.  Eyes are closed.  Pupils 3 mm bilateral reactive to light, disconjugate gaze Chest: Coarse breath sounds, no wheezes or rhonchi Heart: Regular rate and rhythm, no murmurs or gallops Abdomen: Soft, nondistended, bowel sounds present Skin: No rash.  Multiple tattoo marks noted  Labs pending  Resolved Hospital Problem list     Assessment & Plan:  Status epilepticus Known seizure disorder -she has had breakthrough seizure in the past after missing medications.   CTA -no CVA or LVO EEG is negative for active seizures now Continue Keppra and Lamictal Neurology is following Titrate propofol by 10 every hour while keeping EEG on to see if there is a recurrence of seizure  Acute respiratory failure with hypoxia Aspiration pneumonia Continue lung protective ventilation VAP prevention bundle in place PAD protocol with propofol and fentanyl Tolerating spontaneous breathing trial,  once she is off sedation, will try to extubate her Respiratory culture is pending Continue IV ceftriaxone Procalcitonin is high  Hypotension, related to sedation.  Shock is ruled out   Severe anion gap/lactic acidosis and following prolonged refractory seizure , improving Repeat labs are  pending Currently on bicarbonate infusion, which will be stopped once labs are back   AKI after prolonged seizure.   Acute rhabdomyolysis Continue IV fluid Follow-up labs   Best Practice (right click and "Reselect all SmartList Selections" daily)   Diet/type: Tube feeds DVT prophylaxis: prophylactic heparin  GI prophylaxis: H2B Lines: N/A Foley:  Yes, and it is still needed Code Status:  full code Last date of multidisciplinary goals of care discussion [pending]  Labs   CBC: Recent Labs  Lab 02/22/23 0007 02/22/23 0020 02/22/23 0128 02/22/23 0245 02/22/23 0413 02/22/23 0553 02/22/23 1122  WBC 18.1*  --   --  13.8* 12.0*  --   --   NEUTROABS 7.2  --   --   --   --   --   --   HGB 12.0   < > 10.2* 11.2* 11.8* 10.2* 11.9*  HCT 39.2   < > 30.0* 34.2* 34.9* 30.0* 35.0*  MCV 94.7  --   --  86.8 86.6  --   --   PLT 379  --   --  223 196  --   --    < > = values in this interval not displayed.    Basic Metabolic Panel: Recent Labs  Lab 02/22/23 0007 02/22/23 0020 02/22/23 0128 02/22/23 0245 02/22/23 0413 02/22/23 0553 02/22/23 1122 02/22/23 1219 02/22/23 1729  NA 131* 134*  135 136  --  134* 135 137  --   --   K 4.5 4.5  4.5 3.9  --  3.5 3.8 3.8  --   --   CL 100 105  --   --  110  --   --   --   --   CO2 <7*  --   --   --  17*  --   --   --   --   GLUCOSE 274* 261*  --   --  120*  --   --   --   --   BUN 17 17  --   --  15  --   --   --   --   CREATININE 1.74* 1.60*  --  1.40* 1.58*  --   --   --   --   CALCIUM 8.9  --   --   --  7.4*  --   --   --   --   MG  --   --   --   --  2.3  --   --   --   --   PHOS  --   --   --   --   --   --   --  2.5 3.6   GFR: Estimated Creatinine Clearance: 44.6 mL/min (A) (by C-G formula based on SCr of 1.58 mg/dL (H)). Recent Labs  Lab 02/22/23 0007 02/22/23 0020 02/22/23 0245 02/22/23 0251 02/22/23 0413 02/22/23 1219  PROCALCITON  --   --  0.74  --  4.20  --   WBC 18.1*  --  13.8*  --  12.0*  --   LATICACIDVEN  --   >15.0*  --  6.3*  --  2.8*    Liver Function Tests: Recent Labs  Lab 02/22/23 0007 02/22/23 0413  AST 35 80*  ALT 21 36  ALKPHOS 82 62  BILITOT 0.4 0.5  PROT 6.6 5.8*  ALBUMIN 3.6 3.1*   Recent Labs  Lab 02/22/23 0007  LIPASE 46   Recent Labs  Lab 02/22/23 0053  AMMONIA 45*    ABG    Component Value Date/Time   PHART 7.231 (L) 02/22/2023 1122   PCO2ART 35.1 02/22/2023 1122   PO2ART 77 (L) 02/22/2023 1122   HCO3 14.4 (L) 02/22/2023 1122   TCO2 15 (L) 02/22/2023 1122   ACIDBASEDEF 12.0 (H) 02/22/2023 1122   O2SAT 90 02/22/2023 1122     Coagulation Profile: No results for input(s): "INR", "PROTIME" in the last 168 hours.  Cardiac Enzymes: Recent Labs  Lab 02/22/23 0007 02/22/23 0413  CKTOTAL 252* 3,583*    HbA1C: Hgb A1c MFr Bld  Date/Time Value Ref Range Status  02/22/2023 02:45 AM 5.0 4.8 - 5.6 % Final    Comment:    (NOTE) Pre diabetes:          5.7%-6.4%  Diabetes:              >6.4%  Glycemic control for   <7.0% adults with diabetes     CBG: Recent Labs  Lab 02/22/23 1549 02/22/23 1916 02/22/23 2315 02/23/23 0315 02/23/23 0740  GLUCAP 108* 101* 142* 133* 176*    The patient is critically ill due to status epilepticus/acute respiratory failure with hypoxia.  Critical care was necessary to treat or prevent imminent or life-threatening deterioration.  Critical care was time spent personally by me on the following activities: development of treatment plan with patient and/or surrogate as well as nursing, discussions with consultants, evaluation of patient's response to treatment, examination of patient, obtaining history from patient or surrogate, ordering and performing treatments and interventions, ordering and review of laboratory studies, ordering and review of radiographic studies, pulse oximetry, re-evaluation of patient's condition and participation in multidisciplinary rounds.   During this encounter critical care time was devoted to  patient care services described in this note for 38 minutes.     Cheri Fowler, MD [facility] See Amion for pager If no response to pager, please call [phone] until 7pm After 7pm, Please call E-link [phone]

## 2023-02-23 NOTE — Plan of Care (Signed)
  Problem: Nutrition: Goal: Adequate nutrition will be maintained Outcome: Progressing   Problem: Elimination: Goal: Will not experience complications related to bowel motility Outcome: Progressing   Problem: Coping: Goal: Level of anxiety will decrease Outcome: Not Progressing

## 2023-02-23 NOTE — Procedures (Signed)
Extubation Procedure Note  Patient Details:   Name: RYAH MIRRO DOB: 1991-03-16 MRN: [id number]   Airway Documentation:    Vent end date: 02/23/23 Vent end time: 1150   Evaluation  O2 sats: transiently fell during during procedure Complications: No apparent complications Patient did tolerate procedure well. Bilateral Breath Sounds: Diminished, Clear   Yes  RT extubated patient per MD order with RN at bedside. Patient sats 85% on 4L [place]. RT placed patient on Venti-mask 55% and patients sats 87%. Per MD sats 87% or greater are acceptable at this time. Patient had positive cuff leak and can speak. No other new orders for RT at this time. No stridor noted at this time. RT will continue to monitor as needed.    Jaquelyn Bitter 02/23/2023, 12:01 PM

## 2023-02-23 NOTE — Progress Notes (Signed)
LTM maint complete - no skin breakdown under: FZ,FP2

## 2023-02-23 NOTE — Progress Notes (Addendum)
eLink Physician-Brief Progress Note Patient Name: Colleen Pearson DOB: August 08, 1990 MRN: [id number]   Date of Service  02/23/2023  HPI/Events of Note  Foley catheter removed earlier in the day now has 460 mm bladder scan.  eICU Interventions  In-N-Out cath per post indwelling catheter guidelines   0439 -around 4 AM this morning, she had a sudden decline in respiratory status with tachypnea up into the 50s, stable breath sounds, and hypoxemia in the 60s-70s.  Attempt was made to escalate to nonrebreather with minimal benefit.  Patient continues to cough intermittently with no benefit in oxygenation.  Mentation somewhat depressed but she is on low-dose Precedex as well and 0.6 mcg.  Lowered to the ground team about persistent hypoxemia -assess for intubation.  Ground team at bedside.  0540-chest radiograph reviewed-ET tube in appropriate positioning.  Will replace in/out catheterizations for retention with Foley catheter.  Maintain Precedex with fentanyl infusion/pushes.  RASS goal adequate on Precedex.  0631 -ABG with hypercapnic respiratory failure (tidal volume 380, rate 16) and hypoxemia (PEEP 10, FiO2 100%).  PF ratio 80.  Likely benefit from prone ventilation.  Her left side.  Concern for ARDS may be secondary to aspiration.  Increased rate to 26, increase volume to 400.  Arterial line placement.  Duodenal tube placement.  Day team to assess for prone ventilation after access has been established.  Intervention Category Minor Interventions: Routine modifications to care plan (e.g. PRN medications for pain, fever)  Colleen Pearson 02/23/2023, 10:31 PM

## 2023-02-23 NOTE — Progress Notes (Signed)
Patient extremely agitated. Verbal order to max precedex at 1.2 mcg/kg. Verbal order for 4 point restraints. MD on the way to bedside.

## 2023-02-23 NOTE — Procedures (Signed)
Patient Name: Colleen Pearson  MRN: [id number]  Epilepsy Attending: Charlsie Quest  Referring Physician/Provider: Rejeana Brock, MD  Duration: 02/22/2023 3664 to 02/23/2023 4034  Patient history: 32 y.o. female with a history of seizure disorder, cocaine use who presents with status epilepticus. EEG to evaluate for seizure   Level of alertness: comatose  AEDs during EEG study: LEV, LTG, Propofol  Technical aspects: This EEG study was done with scalp electrodes positioned according to the 10-20 International system of electrode placement. Electrical activity was reviewed with band pass filter of 1-70Hz , sensitivity of 7 uV/mm, display speed of 40mm/sec with a 60Hz  notched filter applied as appropriate. EEG data were recorded continuously and digitally stored.  Video monitoring was available and reviewed as appropriate.  Description: EEG showed continuous generalized 3 to 6 Hz theta-delta slowing admixed with 13-15hz  beta activity distributed symmetrically and diffusely. Hyperventilation and photic stimulation were not performed.     ABNORMALITY - Continuous slow, generalized - Excessive beta, generalized  IMPRESSION: This study is suggestive of severe diffuse encephalopathy likely related to sedation. No seizures or epileptiform discharges were seen throughout the recording.  Colleen Pearson [name]

## 2023-02-23 NOTE — Progress Notes (Signed)
Order for PICC noted. Patient currently still very active at times and Illene Labrador RN states she isn't sure the patient could comply with a sterile field procedure. Patient with 2 PIV's. VAST to follow up on 9-4.

## 2023-02-24 ENCOUNTER — Inpatient Hospital Stay (HOSPITAL_COMMUNITY): Payer: Medicaid Other

## 2023-02-24 DIAGNOSIS — R569 Unspecified convulsions: Principal | ICD-10-CM

## 2023-02-24 DIAGNOSIS — J690 Pneumonitis due to inhalation of food and vomit: Secondary | ICD-10-CM | POA: Diagnosis not present

## 2023-02-24 DIAGNOSIS — G40901 Epilepsy, unspecified, not intractable, with status epilepticus: Secondary | ICD-10-CM | POA: Diagnosis not present

## 2023-02-24 DIAGNOSIS — J9602 Acute respiratory failure with hypercapnia: Secondary | ICD-10-CM

## 2023-02-24 DIAGNOSIS — J9601 Acute respiratory failure with hypoxia: Secondary | ICD-10-CM | POA: Diagnosis not present

## 2023-02-24 LAB — BASIC METABOLIC PANEL
Anion gap: 12 (ref 5–15)
BUN: 27 mg/dL — ABNORMAL HIGH (ref 6–20)
CO2: 18 mmol/L — ABNORMAL LOW (ref 22–32)
Calcium: 7.1 mg/dL — ABNORMAL LOW (ref 8.9–10.3)
Chloride: 111 mmol/L (ref 98–111)
Creatinine, Ser: 1.81 mg/dL — ABNORMAL HIGH (ref 0.44–1.00)
GFR, Estimated: 38 mL/min — ABNORMAL LOW (ref >60)
Glucose, Bld: 145 mg/dL — ABNORMAL HIGH (ref 70–99)
Potassium: 3.6 mmol/L (ref 3.5–5.1)
Sodium: 141 mmol/L (ref 135–145)

## 2023-02-24 LAB — POCT I-STAT 7, (LYTES, BLD GAS, ICA,H+H)
Acid-base deficit: 7 mmol/L — ABNORMAL HIGH (ref 0.0–2.0)
Acid-base deficit: 6 mmol/L — ABNORMAL HIGH (ref 0.0–2.0)
Bicarbonate: 22.7 mmol/L (ref 20.0–28.0)
Bicarbonate: 18.6 mmol/L — ABNORMAL LOW (ref 20.0–28.0)
Calcium, Ion: 1.05 mmol/L — ABNORMAL LOW (ref 1.15–1.40)
Calcium, Ion: 1.13 mmol/L — ABNORMAL LOW (ref 1.15–1.40)
HCT: 29 % — ABNORMAL LOW (ref 36.0–46.0)
HCT: 30 % — ABNORMAL LOW (ref 36.0–46.0)
Hemoglobin: 9.9 g/dL — ABNORMAL LOW (ref 12.0–15.0)
Hemoglobin: 10.2 g/dL — ABNORMAL LOW (ref 12.0–15.0)
O2 Saturation: 90 %
O2 Saturation: 98 %
Patient temperature: 101.1
Patient temperature: 99.1
Potassium: 3.6 mmol/L (ref 3.5–5.1)
Potassium: 4.6 mmol/L (ref 3.5–5.1)
Sodium: 145 mmol/L (ref 135–145)
Sodium: 142 mmol/L (ref 135–145)
TCO2: 25 mmol/L (ref 22–32)
TCO2: 20 mmol/L — ABNORMAL LOW (ref 22–32)
pCO2 arterial: 70.5 mmHg (ref 32–48)
pCO2 arterial: 33.2 mmHg (ref 32–48)
pH, Arterial: 7.124 — CL (ref 7.35–7.45)
pH, Arterial: 7.358 (ref 7.35–7.45)
pO2, Arterial: 84 mmHg (ref 83–108)
pO2, Arterial: 115 mmHg — ABNORMAL HIGH (ref 83–108)

## 2023-02-24 LAB — MAGNESIUM
Magnesium: 2.1 mg/dL (ref 1.7–2.4)
Magnesium: 2.2 mg/dL (ref 1.7–2.4)

## 2023-02-24 LAB — PHOSPHORUS: Phosphorus: 2.7 mg/dL (ref 2.5–4.6)

## 2023-02-24 LAB — GLUCOSE, CAPILLARY
Glucose-Capillary: 122 mg/dL — ABNORMAL HIGH (ref 70–99)
Glucose-Capillary: 150 mg/dL — ABNORMAL HIGH (ref 70–99)
Glucose-Capillary: 162 mg/dL — ABNORMAL HIGH (ref 70–99)
Glucose-Capillary: 191 mg/dL — ABNORMAL HIGH (ref 70–99)
Glucose-Capillary: 195 mg/dL — ABNORMAL HIGH (ref 70–99)
Glucose-Capillary: 218 mg/dL — ABNORMAL HIGH (ref 70–99)

## 2023-02-24 LAB — CK: Total CK: 47715 U/L — ABNORMAL HIGH (ref 38–234)

## 2023-02-24 LAB — SARS CORONAVIRUS 2 BY RT PCR: SARS Coronavirus 2 by RT PCR: POSITIVE — AB

## 2023-02-24 MED ORDER — NOREPINEPHRINE 4 MG/250ML-% IV SOLN
2.0000 ug/min | INTRAVENOUS | Status: DC
  Administered 2023-02-24: 2 ug/min via INTRAVENOUS
  Filled 2023-02-24: qty 250

## 2023-02-24 MED ORDER — SODIUM CHLORIDE 0.9 % IV SOLN
250.0000 mL | INTRAVENOUS | Status: DC
  Administered 2023-02-24: 250 mL via INTRAVENOUS

## 2023-02-24 MED ORDER — ETOMIDATE 2 MG/ML IV SOLN: 20.0000 mg | Freq: Once | INTRAVENOUS | Status: AC

## 2023-02-24 MED ORDER — FAMOTIDINE 20 MG PO TABS
20.0000 mg | ORAL_TABLET | Freq: Every day | ORAL | Status: DC
  Administered 2023-02-25 – 2023-03-01 (×5): 20 mg
  Filled 2023-02-24 (×5): qty 1

## 2023-02-24 MED ORDER — STERILE WATER FOR INJECTION IV SOLN
INTRAVENOUS | Status: DC
  Filled 2023-02-24 (×4): qty 1000

## 2023-02-24 MED ORDER — SODIUM CHLORIDE 0.9 % IV SOLN
750.0000 mg | Freq: Two times a day (BID) | INTRAVENOUS | Status: DC
  Administered 2023-02-24 – 2023-02-25 (×2): 750 mg via INTRAVENOUS
  Filled 2023-02-24 (×3): qty 7.5

## 2023-02-24 MED ORDER — SUCCINYLCHOLINE CHLORIDE 200 MG/10ML IV SOSY
PREFILLED_SYRINGE | INTRAVENOUS | Status: AC
  Filled 2023-02-24: qty 10

## 2023-02-24 MED ORDER — PIPERACILLIN-TAZOBACTAM 3.375 G IVPB
3.3750 g | Freq: Three times a day (TID) | INTRAVENOUS | Status: DC
  Administered 2023-02-24 – 2023-02-25 (×4): 3.375 g via INTRAVENOUS
  Filled 2023-02-24 (×4): qty 50

## 2023-02-24 MED ORDER — SODIUM CHLORIDE 0.9 % IV SOLN: INTRAVENOUS | Status: DC | PRN

## 2023-02-24 MED ORDER — ROCURONIUM BROMIDE 10 MG/ML (PF) SYRINGE
PREFILLED_SYRINGE | INTRAVENOUS | Status: AC
  Administered 2023-02-24: 70 mg
  Filled 2023-02-24: qty 10

## 2023-02-24 MED ORDER — MIDAZOLAM HCL 2 MG/2ML IJ SOLN
INTRAMUSCULAR | Status: AC
  Administered 2023-02-24: 2 mg
  Filled 2023-02-24: qty 2

## 2023-02-24 MED ORDER — ORAL CARE MOUTH RINSE
15.0000 mL | OROMUCOSAL | Status: DC
  Administered 2023-02-24 – 2023-02-26 (×32): 15 mL via OROMUCOSAL

## 2023-02-24 MED ORDER — PANTOPRAZOLE SODIUM 40 MG IV SOLR
40.0000 mg | Freq: Every day | INTRAVENOUS | Status: DC
  Administered 2023-02-24 – 2023-03-01 (×6): 40 mg via INTRAVENOUS
  Filled 2023-02-24 (×7): qty 10

## 2023-02-24 MED ORDER — KETAMINE HCL 50 MG/5ML IJ SOSY
PREFILLED_SYRINGE | INTRAMUSCULAR | Status: AC
  Filled 2023-02-24: qty 10

## 2023-02-24 MED ORDER — ORAL CARE MOUTH RINSE: 15.0000 mL | OROMUCOSAL | Status: DC | PRN

## 2023-02-24 MED ORDER — ETOMIDATE 2 MG/ML IV SOLN
INTRAVENOUS | Status: AC
  Administered 2023-02-24: 20 mg
  Filled 2023-02-24: qty 20

## 2023-02-24 MED ORDER — ROCURONIUM BROMIDE 50 MG/5ML IV SOLN
100.0000 mg | Freq: Once | INTRAVENOUS | Status: AC
  Filled 2023-02-24: qty 10

## 2023-02-24 MED ORDER — ROCURONIUM BROMIDE 10 MG/ML (PF) SYRINGE
PREFILLED_SYRINGE | INTRAVENOUS | Status: AC
  Administered 2023-02-24: 100 mg
  Filled 2023-02-24: qty 10

## 2023-02-24 MED ORDER — FENTANYL CITRATE PF 50 MCG/ML IJ SOSY
PREFILLED_SYRINGE | INTRAMUSCULAR | Status: AC
  Administered 2023-02-24: 100 ug
  Filled 2023-02-24: qty 2

## 2023-02-24 MED ORDER — CALCIUM GLUCONATE-NACL 2-0.675 GM/100ML-% IV SOLN
2.0000 g | Freq: Once | INTRAVENOUS | Status: AC
  Administered 2023-02-24: 2000 mg via INTRAVENOUS
  Filled 2023-02-24: qty 100

## 2023-02-24 MED ORDER — MIDAZOLAM HCL 2 MG/2ML IJ SOLN
INTRAMUSCULAR | Status: AC
  Filled 2023-02-24: qty 2

## 2023-02-24 MED ORDER — DEXAMETHASONE SODIUM PHOSPHATE 10 MG/ML IJ SOLN
6.0000 mg | INTRAMUSCULAR | Status: DC
  Administered 2023-02-24 – 2023-03-02 (×7): 6 mg via INTRAVENOUS
  Filled 2023-02-24 (×7): qty 1

## 2023-02-24 MED ORDER — ETOMIDATE 2 MG/ML IV SOLN
INTRAVENOUS | Status: AC
  Filled 2023-02-24: qty 20

## 2023-02-24 MED ORDER — VANCOMYCIN HCL 1250 MG/250ML IV SOLN
1250.0000 mg | Freq: Once | INTRAVENOUS | Status: AC
  Administered 2023-02-24: 1250 mg via INTRAVENOUS
  Filled 2023-02-24: qty 250

## 2023-02-24 MED ORDER — MIDAZOLAM HCL 2 MG/2ML IJ SOLN
2.0000 mg | Freq: Once | INTRAMUSCULAR | Status: AC
  Administered 2023-02-24: 2 mg via INTRAVENOUS

## 2023-02-24 MED ORDER — IPRATROPIUM-ALBUTEROL 0.5-2.5 (3) MG/3ML IN SOLN
3.0000 mL | Freq: Four times a day (QID) | RESPIRATORY_TRACT | Status: DC
  Administered 2023-02-24 – 2023-02-27 (×13): 3 mL via RESPIRATORY_TRACT
  Filled 2023-02-24 (×13): qty 3

## 2023-02-24 NOTE — Procedures (Signed)
Bronchoscopy Procedure Note  Colleen Pearson  [id number]  [birth year]  Date:02/24/23  Time:10:48 AM   Provider Performing:Chaz Ronning   Procedure(s):  Flexible bronchoscopy with bronchial alveolar lavage [phone]) and Initial Therapeutic Aspiration of Tracheobronchial Tree [phone])  Indication(s) Aspiration [place]  Consent Risks of the procedure as well as the alternatives and risks of each were explained to the patient and/or caregiver.  Consent for the procedure was obtained and is signed in the bedside chart  Anesthesia Etomidate and Rocuronium   Time Out Verified patient identification, verified procedure, site/side was marked, verified correct patient position, special equipment/implants available, medications/allergies/relevant history reviewed, required imaging and test results available.   Sterile Technique Usual hand hygiene, masks, gowns, and gloves were used   Procedure Description Bronchoscope advanced through endotracheal tube and into airway.  Airways were examined down to subsegmental level with findings noted below.   Following diagnostic evaluation, BAL(s) performed in LLL with normal saline and return of Brown colored fluid and Therapeutic aspiration performed in RLL/LLL  Findings: Moderate amounts of tenacious secretions noted throughout airway, but especially in lower lobes. Mucosa looks inflammed   Complications/Tolerance None; patient tolerated the procedure well. Chest X-ray is not needed post procedure.   EBL Minimal   Specimen(s) BAL

## 2023-02-24 NOTE — Progress Notes (Signed)
Pharmacy Antibiotic Note  Colleen Pearson is a 32 y.o. female admitted on 02/22/2023 with  seizures, new aspiration pneumonia .  9/4 CXR demonstrated worsening opacities compared to previous imaging and patient was re-intubated overnight. Pharmacy has been consulted for vancomycin/zosyn dosing.  Plan: Zosyn 3.375g IV q8h (4 hour infusion). Vancomyin 1250 mg IV x1 bolus dose for unstable renal function Discontinue ceftriaxone  Vanc random with AM labs 9/5   Height: 5\' 1"  (154.9 cm) Weight: 59.3 kg (130 lb 11.7 oz) IBW/kg (Calculated) : 47.8  Temp (24hrs), Avg:99.2 F (37.3 C), Min:97.3 F (36.3 C), Max:100.3 F (37.9 C)  Recent Labs  Lab 02/22/23 0007 02/22/23 0020 02/22/23 0245 02/22/23 0251 02/22/23 0413 02/22/23 1219 02/23/23 0750 02/23/23 0915 02/24/23 0613  WBC 18.1*  --  13.8*  --  12.0*  --  18.4*  --   --   CREATININE 1.74* 1.60* 1.40*  --  1.58*  --   --  1.64* 1.81*  LATICACIDVEN  --  >15.0*  --  6.3*  --  2.8*  --   --   --     Estimated Creatinine Clearance: 36.9 mL/min (A) (by C-G formula based on SCr of 1.81 mg/dL (H)).    Allergies  Allergen Reactions   Penicillins Hives and Swelling   Tramadol Hives and Swelling    Antimicrobials this admission: CTX 9/2 > 9/4 Zosyn 9/4 > Vanc 9/4 >  Microbiology results: 9/2 BCX > ng2d 9/2 TA CX> no organisms  9/2 MRSA PCR > positive 9/4 COVID > positive  Thank you for allowing pharmacy to be a part of this patient's care.  Rutherford Nail, PharmD PGY2 Critical Care Pharmacy Resident 02/24/2023 10:54 AM

## 2023-02-24 NOTE — Procedures (Signed)
Intubation Procedure Note  Colleen Pearson  [id number]  [birth year]  Date:02/24/23  Time:5:10 AM   Provider Performing:Jorgina Binning R Savino Whisenant    Procedure: Intubation (31500)  Indication(s) Respiratory Failure  Consent Unable to obtain consent due to emergent nature of procedure.   Anesthesia Versed, Fentanyl, and Rocuronium   Time Out Verified patient identification, verified procedure, site/side was marked, verified correct patient position, special equipment/implants available, medications/allergies/relevant history reviewed, required imaging and test results available.   Sterile Technique Usual hand hygeine, masks, and gloves were used   Procedure Description Patient positioned in bed supine.  Sedation given as noted above.  Patient was intubated with endotracheal tube using Glidescope.  View was Grade 1 full glottis .  Number of attempts was 1.  Colorimetric CO2 detector was consistent with tracheal placement.   Complications/Tolerance None; patient tolerated the procedure well. Chest X-ray is ordered to verify placement.   EBL none   Specimen(s) None

## 2023-02-24 NOTE — Procedures (Signed)
Arterial Catheter Insertion Procedure Note  Colleen Pearson  [id number]  [birth year]  Date:02/24/23  Time:5:13 PM    Provider Performing: Tim Lair    Procedure: Insertion of Arterial Line (40981) with US guidance (19147)    Indication(s): Blood pressure monitoring and/or need for frequent ABGs   Consent: Unable to obtain consent due to emergent nature of procedure.   Anesthesia: None   Time Out: Verified patient identification, verified procedure, site/side was marked, verified correct patient position, special equipment/implants available, medications/allergies/relevant history reviewed, required imaging and test results available.   Sterile Technique: Maximal sterile technique including full sterile barrier drape, hand hygiene, sterile gown, sterile gloves, mask, hair covering, sterile ultrasound probe cover (if used).   Procedure Description: Area of catheter insertion was cleaned with chlorhexidine and draped in sterile fashion. With real-time ultrasound guidance an arterial catheter was placed into the right radial artery.  Appropriate arterial tracings confirmed on monitor.     Complications/Tolerance: None; patient tolerated the procedure well.   EBL: Minimal   Specimen(s): None   Tim Lair, [facility] 02/24/23 5:13 PM  Please see [email or web address] for pager details.  From 7A-7P if no response, please call [phone] After hours, please call ELink [phone]

## 2023-02-24 NOTE — Progress Notes (Signed)
Patient found on ventilator settings of RR: 16 and VT: 380.  Per MD vent order, changed VT to 400 and RR to 26.  Will obtain ABG in an hour.  Will continue to monitor.

## 2023-02-24 NOTE — Progress Notes (Signed)
iSTAT ran.  Critical values resulted.  pH 7.124 and pCO2 70.5.  Elink already in room on camera and RT at bedside.  Critical results read to Elink.  RT aware.

## 2023-02-24 NOTE — Procedures (Signed)
 Cortrak  Person Inserting Tube:  Mahala Menghini, RD Tube Type:  Cortrak - 43 inches Tube Size:  10 Tube Location:  Right nare Secured by: Bridle Technique Used to Measure Tube Placement:  Marking at nare/corner of mouth Cortrak Secured At:  65 cm   Cortrak Tube Team Note:  Consult received to place a Cortrak feeding tube.   X-ray is required, abdominal x-ray has been ordered by the Cortrak team. Please confirm tube placement before using the Cortrak tube.   If the tube becomes dislodged please keep the tube and contact the Cortrak team at [email or web address] for replacement.  If after hours and replacement cannot be delayed, place a NG tube and confirm placement with an abdominal x-ray.    Mertie Clause, MS, RD, LDN Registered Dietitian II Please see AMiON for contact information.

## 2023-02-24 NOTE — Progress Notes (Signed)
vLTM maintenance  All impedances below 10kohms.  No skin breakdown at FP1  C4  P4  02

## 2023-02-24 NOTE — Progress Notes (Signed)
NAME:  Colleen Pearson, MRN:  [id number], DOB:  [birth year], LOS: 2 ADMISSION DATE:  02/22/2023, CONSULTATION DATE:  9/2 REFERRING MD:  Blinda Leatherwood, CHIEF COMPLAINT:  statis epilepticus    History of Present Illness:   This is a 32 year old female patient with a history of epilepsy, for which she takes Keppra 1000 mg twice daily, last breakthrough seizure noted in June 2024.  Presents to the hospital via EMS after family contacted them for prolonged seizure.  Reportedly seizure lasted for approximately 45 minutes. She was still in status epilepticus on EMS arrival requiring multiple doses of midazolam prehospital.  On initial evaluation by EDP patient was unresponsive, requiring assisted ventilation via mask.  She was intubated loaded with IV Keppra seen by neurology, CT head was negative, additionally received a total of 4 L of crystalloid for intermittent hypotension UDS positive for THC.   Additional ER evaluation demonstrated the following: White blood cell count 18.1, sodium 131, bicarbonate less than 7, glucose 274, creatinine 1.74, lipase 46 beta hydroxybutyric acid was negative at 0.12 initial total CKs 252 urine pregnancy negative initial venous blood gas showed pH of 6.84 pCO2 of 37 and HCO3 of 6.3 her lactate was > 15.   Critical care asked to admit Pertinent  Medical History  Seizure disorder, followed at [facility] Events: Including procedures, antibiotic start and stop dates in addition to other pertinent events   32 year old female patient admitted in status epilepticus intubated in the emergency room, received 4 L of crystalloid for intermittent hypotension.  Loaded with Keppra, , started on ceftriaxone for possible aspiration 9/2 ceribell EEG suggesting focal seizure episode at 03 26  Interim History / Subjective:  Patient failed extubation trial, overnight became agitated, hypoxic requiring reintubation She came back COVID-positive CK rose to 45K, she developed  AKI  Objective   Blood pressure 98/74, pulse 90, temperature 99.1 F (37.3 C), temperature source Oral, resp. rate 17, height 5\' 1"  (1.549 m), weight 59.3 kg, SpO2 94%.    Vent Mode: PRVC FiO2 (%):  [40 %-100 %] 100 % Set Rate:  [16 bmp-26 bmp] 26 bmp Vt Set:  [380 mL-400 mL] 400 mL PEEP:  [5 cmH20-10 cmH20] 10 cmH20 Pressure Support:  [5 cmH20] 5 cmH20 Plateau Pressure:  [22 cmH20] 22 cmH20   Intake/Output Summary (Last 24 hours) at 02/24/2023 0839 Last data filed at 02/24/2023 0800 Gross per 24 hour  Intake 2499.32 ml  Output 3425 ml  Net -925.68 ml   Filed Weights   02/22/23 0437 02/23/23 0500 02/24/23 5956  Weight: 66.4 kg 66.5 kg 59.3 kg    Examination: General: Crtitically ill-appearing young female, orally intubated HEENT: Bruni/AT, eyes anicteric.  ETT and OGT in place Neuro: Sedated, not following commands.  Eyes are closed.  Pupils 3 mm bilateral reactive to light Chest: Bilateral crackles at bases, no wheezes or rhonchi Heart: Regular rate and rhythm, no murmurs or gallops Abdomen: Soft, nondistended, bowel sounds present Skin: No rash.  Multiple tattoo marks noted  Labs and images were reviewed  Resolved Hospital Problem list     Assessment & Plan:  Status epilepticus Known seizure disorder -she has had breakthrough seizure in the past after missing medications.   CTA -no CVA or LVO Could be related to substance abuse, in the past she used cocaine but this time her U tox is positive for [facility] but there is a possibility she may have used synthetic substances EEG is negative for active seizures now Continue  Keppra and Lamictal Appreciate neurology follow-up  Acute respiratory failure with hypoxia and hypercapnia Severe sepsis with septic shock due to aspiration pneumonia with ARDS, not POA COVID pneumonia Continue lung protective ventilation VAP prevention bundle in place Vent setting was adjusted to clear hypercapnia Repeat ABG showed pH 7.36, pCO2 32 and PaO2  110 PAD protocol with Precedex and fentanyl with RASS goal -2 Continue IV antibiotics Follow-up respiratory culture She is COVID-positive Will start dexamethasone 6 mg for 10 days   Severe anion gap/lactic acidosis and following prolonged refractory seizure , improving In the setting of seizure and AKI Continue bicarbonate infusion   AKI after prolonged seizure.   Severe acute rhabdomyolysis Continue IV fluid Serum creatinine is trending up Avoid nephrotoxic agent Monitor intake and output Her CK went up to 47K  Best Practice (right click and "Reselect all SmartList Selections" daily)   Diet/type: Tube feeds DVT prophylaxis: prophylactic heparin  GI prophylaxis: H2B Lines: N/A Foley:  Yes, and it is still needed Code Status:  full code Last date of multidisciplinary goals of care discussion [9/4: Patient's father and mother both were updated over the phone, decision was to continue full scope of care]  Labs   CBC: Recent Labs  Lab 02/22/23 0007 02/22/23 0020 02/22/23 0245 02/22/23 0413 02/22/23 0553 02/22/23 1122 02/23/23 0750 02/24/23 0617  WBC 18.1*  --  13.8* 12.0*  --   --  18.4*  --   NEUTROABS 7.2  --   --   --   --   --  16.3*  --   HGB 12.0   < > 11.2* 11.8* 10.2* 11.9* 11.1* 9.9*  HCT 39.2   < > 34.2* 34.9* 30.0* 35.0* 32.7* 29.0*  MCV 94.7  --  86.8 86.6  --   --  86.3  --   PLT 379  --  223 196  --   --  175  --    < > = values in this interval not displayed.    Basic Metabolic Panel: Recent Labs  Lab 02/22/23 0007 02/22/23 0020 02/22/23 0128 02/22/23 0245 02/22/23 0413 02/22/23 0553 02/22/23 1122 02/22/23 1219 02/22/23 1729 02/23/23 0915 02/24/23 0613 02/24/23 0617  NA 131* 134*  135   < >  --  134* 135 137  --   --  139 141 145  K 4.5 4.5  4.5   < >  --  3.5 3.8 3.8  --   --  3.6 3.6 3.6  CL 100 105  --   --  110  --   --   --   --  108 111  --   CO2 <7*  --   --   --  17*  --   --   --   --  23 18*  --   GLUCOSE 274* 261*  --   --   120*  --   --   --   --  162* 145*  --   BUN 17 17  --   --  15  --   --   --   --  17 27*  --   CREATININE 1.74* 1.60*  --  1.40* 1.58*  --   --   --   --  1.64* 1.81*  --   CALCIUM 8.9  --   --   --  7.4*  --   --   --   --  7.1* 7.1*  --   MG  --   --   --   --  2.3  --   --   --   --  2.3 2.1  --   PHOS  --   --   --   --   --   --   --  2.5 3.6 3.0  --   --    < > = values in this interval not displayed.   GFR: Estimated Creatinine Clearance: 36.9 mL/min (A) (by C-G formula based on SCr of 1.81 mg/dL (H)). Recent Labs  Lab 02/22/23 0007 02/22/23 0020 02/22/23 0245 02/22/23 0251 02/22/23 0413 02/22/23 1219 02/23/23 0750  PROCALCITON  --   --  0.74  --  4.20  --   --   WBC 18.1*  --  13.8*  --  12.0*  --  18.4*  LATICACIDVEN  --  >15.0*  --  6.3*  --  2.8*  --     Liver Function Tests: Recent Labs  Lab 02/22/23 0007 02/22/23 0413  AST 35 80*  ALT 21 36  ALKPHOS 82 62  BILITOT 0.4 0.5  PROT 6.6 5.8*  ALBUMIN 3.6 3.1*   Recent Labs  Lab 02/22/23 0007  LIPASE 46   Recent Labs  Lab 02/22/23 0053  AMMONIA 45*    ABG    Component Value Date/Time   PHART 7.124 (LL) 02/24/2023 0617   PCO2ART 70.5 (HH) 02/24/2023 0617   PO2ART 84 02/24/2023 0617   HCO3 22.7 02/24/2023 0617   TCO2 25 02/24/2023 0617   ACIDBASEDEF 7.0 (H) 02/24/2023 0617   O2SAT 90 02/24/2023 0617     Coagulation Profile: No results for input(s): "INR", "PROTIME" in the last 168 hours.  Cardiac Enzymes: Recent Labs  Lab 02/22/23 0007 02/22/23 0413 02/23/23 0915 02/24/23 0613  CKTOTAL 252* 3,583* 15,317* 47,715*    HbA1C: Hgb A1c MFr Bld  Date/Time Value Ref Range Status  02/22/2023 02:45 AM 5.0 4.8 - 5.6 % Final    Comment:    (NOTE) Pre diabetes:          5.7%-6.4%  Diabetes:              >6.4%  Glycemic control for   <7.0% adults with diabetes     CBG: Recent Labs  Lab 02/23/23 1119 02/23/23 1541 02/23/23 1928 02/23/23 2327 02/24/23 0305  GLUCAP 131* 151* 99  104* 122*    The patient is critically ill due to status epilepticus/acute respiratory failure with hypoxia.  Critical care was necessary to treat or prevent imminent or life-threatening deterioration.  Critical care was time spent personally by me on the following activities: development of treatment plan with patient and/or surrogate as well as nursing, discussions with consultants, evaluation of patient's response to treatment, examination of patient, obtaining history from patient or surrogate, ordering and performing treatments and interventions, ordering and review of laboratory studies, ordering and review of radiographic studies, pulse oximetry, re-evaluation of patient's condition and participation in multidisciplinary rounds.   During this encounter critical care time was devoted to patient care services described in this note for 46 minutes.     Cheri Fowler, MD [facility] See Amion for pager If no response to pager, please call [phone] until 7pm After 7pm, Please call E-link [phone]

## 2023-02-24 NOTE — Progress Notes (Signed)
eLink Physician-Brief Progress Note Patient Name: SCOTTY STOHLER DOB: 01/26/1991 MRN: [id number]   Date of Service  02/24/2023  HPI/Events of Note  32 year old with a history of epilepsy none came in with a prolonged breakthrough seizure and was briefly intubated, got extubated on 9/3 and reintubated overnight with progressive respiratory failure found to be COVID-positive.  eICU Interventions  Resume tube feeds, tube in appropriate position near the pylorus   0015 -reporting dark tea colored urine-orange versus pink in the bag.  Urinalysis ordered, last urinalysis unremarkable   0420 - add Glargine 10 units this AM. Progressive hyperglycemia  0602 -received a dose of Versed at shift change and was fairly well sedated most of the night.  This morning, she has become progressively more agitated now on 1.2 of Precedex and 200 mcg of fentanyl and still agitated, attempting to pull at lines and tubes, attempting to get out of bed-with an Ativan bolus and fentanyl bolus, she was able to calm down for about 15 minutes until she needed an additional fentanyl bolus.  Based on clinical progression, anticipate spontaneous awakening/breathing trial later this morning with potential extubation-will initiate propofol as an adjunct in the interim.  Intervention Category Minor Interventions: Routine modifications to care plan (e.g. PRN medications for pain, fever)  Rodolfo Gaster 02/24/2023, 7:54 PM

## 2023-02-24 NOTE — Progress Notes (Signed)
ABG ordered post ventilator changes.  Sample obtained on ventilator settings of VT: 400, RR: 26, FIO2: 100%, and PEEP: 10.  Results given to MD.  Decreased FIO2 to 70%.  Will continue to monitor and wean FIO2 and PEEP as tolerated.     Latest Reference Range & Units 02/24/23 09:02  Sample type  ARTERIAL  pH, Arterial 7.35 - 7.45  7.358  pCO2 arterial 32 - 48 mmHg 33.2  pO2, Arterial 83 - 108 mmHg 115 (H)  TCO2 22 - 32 mmol/L 20 (L)  Acid-base deficit 0.0 - 2.0 mmol/L 6.0 (H)  Bicarbonate 20.0 - 28.0 mmol/L 18.6 (L)  O2 Saturation % 98  Patient temperature  99.1 F  Collection site  RADIAL, ALLEN'S TEST ACCEPTABLE

## 2023-02-24 NOTE — Progress Notes (Signed)
PT Cancellation Note  Patient Details Name: Colleen Pearson MRN: [id number] DOB: 03-27-1991   Cancelled Treatment:    Reason Eval/Treat Not Completed: (P) Patient not medically ready. Per chart, pt intubated earlier this morning and on high vent settings. Will plan to follow-up as able.   Virgil Benedict, PT, DPT Acute Rehabilitation Services  Office: [phone]    Bettina Gavia 02/24/2023, 7:29 AM

## 2023-02-24 NOTE — Progress Notes (Signed)
Neurology Progress Note  Brief HPI:  Colleen Pearson is a 32 y.o. female with a past medical history of epilepsy on Keppra 1000 mg twice daily and Lamictal 150 mg twice daily.  She currently follows with Dr. Malvin Johns at the [facility].  She was brought to the hospital via EMS after family called due to a prolonged seizure.  The seizure reportedly lasted for approximately 45 minutes and she was still in status on arrival to the hospital.  She was intubated, loaded with Keppra and she is now on LTM. Extubated on 9/3 and re intubated 9/4. COVID positive.   Med reconciliation done by pharmacy and patient is on lamictal 150mg  BID despite allergy listed in Epic per Dr. Malvin Johns at [facility]. Last visit October 14, 2022 when dose was increased to 150mg  BID.   Current AEDs: Keppra 1000 mg twice daily Lamictal 150 mg twice daily  Subjective: Patient admitted 9/2 for concern for status epilepticus. Failed extubation and re intubated 9/4. CK elevated to 153[address] -> 47715, WBC 18.4. Stop phenobarbital while on sedation. Respiratory swab-COVID positive   Exam: Vitals:   02/24/23 0723 02/24/23 0738  BP:  90/76  Pulse:  97  Resp:  (!) 32  Temp: 99.1 F (37.3 C)   SpO2:  93%   General: Reintubated overnight.  On sedation with fentanyl and Precedex. Neurological exam Sedated intubated No spontaneous movement Pupils equal round reactive to light Mildly disconjugate gaze No movement to noxious stimulation Breathing over the ventilator  Pertinent Labs: CBC: WBC 12.0, hemoglobin 10.2, platelet count 196,000. CMP: Sodium 134, potassium 3.5, glucose 120, chloride 110, bicarb 17.  Creatinine 1.58.  Magnesium 2.3. U-Tox: +THC and +Benzo(likely from EMS) Prior Utox 03/23 was  + for amphetamines, cocaine, benzo, THC and cannabinoids. COVID positive  CK: 252 -> 3,583 -> 15,317 -> 47,715  Imaging Reviewed:  LTM EEG: 02/22/2023 0822 to 02/23/2023 4696  This study is suggestive of severe diffuse  encephalopathy likely related to sedation. No seizures or epileptiform discharges were seen throughout the recording.   Assessment:  32 year old female found to be in status epilepticus by family-reportedly seizing upwards of 45 minutes.   Status broken with benzos and Keppra load.   Currently on Keppra 1000 mg twice daily and Lamictal 150 mg twice daily.  Urine tox positive for THC.  She was extubated yesterday but needed reintubation for respiratory distress.  Concern for pneumonia versus ARDS.  Also has CK that has been rising and has 47,000 today. She is currently reintubated in the ICU and on fentanyl and Precedex for sedation.  Per CCM plan to titrate down sedation today.  Respiratory swab also positive for COVID-19.  Might have lowered seizure threshold.  Impression:  Status epilepticus - treated with benzodiazepines Breakthrough seizure caused by [facility] use  Recommendations: - Titrate down continuous sedation, wean per CCM -Had plan to DC LTM if no seizures overnight but given that she is reintubated and has evidence of a new respiratory infection and possible large, I will keep her on LTM for another day, may be more-to make sure that she is not seizing through while sedation is being lowered. - Continue Keppra 1000mg  BID and Lamictal 150mg  BID - Seizure precautions  Seizure precautions: Per [facility] statutes, patients with seizures are not allowed to drive until they have been seizure-free for six months and cleared by a physician. Use caution when using heavy equipment or power tools. Avoid working on ladders or at heights. Take showers  instead of baths. Ensure the water temperature is not too high on the home water heater. Do not go swimming alone. Do not lock yourself in a room alone (i.e. bathroom). When caring for infants or small children, sit down when holding, feeding, or changing them to minimize risk of injury to the child in the event you have a seizure. Maintain good  sleep hygiene. Avoid alcohol.     Patient seen and examined by NP/APP with MD. MD to update note as needed.   Elmer Picker, DNP, FNP-BC [facility] Pager: [phone]  Attending Neurohospitalist Addendum Patient seen and examined with APP/Resident. Agree with the history and physical as documented above. Agree with the plan as documented, which I helped formulate. I have independently reviewed the chart, obtained history, review of systems and examined the patient.I have personally reviewed pertinent head/neck/spine imaging (CT/MRI). Please feel free to call with any questions.  -- Milon Dikes, MD Neurologist [facility] Pager: [phone]  CRITICAL CARE ATTESTATION Performed by: Milon Dikes, MD Total critical care time: 45 minutes Critical care time was exclusive of separately billable procedures and treating other patients and/or supervising APPs/Residents/Students Critical care was necessary to treat or prevent imminent or life-threatening deterioration. This patient is critically ill and at significant risk for neurological worsening and/or death and care requires constant monitoring. Critical care was time spent personally by me on the following activities: development of treatment plan with patient and/or surrogate as well as nursing, discussions with consultants, evaluation of patient's response to treatment, examination of patient, obtaining history from patient or surrogate, ordering and performing treatments and interventions, ordering and review of laboratory studies, ordering and review of radiographic studies, pulse oximetry, re-evaluation of patient's condition, participation in multidisciplinary rounds and medical decision making of high complexity in the care of this patient.

## 2023-02-24 NOTE — Progress Notes (Signed)
Nutrition Follow-up  DOCUMENTATION CODES:   Not applicable  INTERVENTION:   Tube feeding via Cortrak tube: Osmolite 1.5 at 55 ml/h (1320 ml per day) Prosource TF20 60 ml daily  Provides 2060 kcal, 102 gm protein, 1003 ml free water daily   NUTRITION DIAGNOSIS:   Inadequate oral intake related to inability to eat as evidenced by NPO status. Ongoing.   GOAL:   Patient will meet greater than or equal to 90% of their needs Met with TF at goal   MONITOR:   TF tolerance  REASON FOR ASSESSMENT:   Consult Enteral/tube feeding initiation and management  ASSESSMENT:   Pt with PMH of epilepsy with last breakthrough sz 11/2022 admitted in status epilepticus.   Pt discussed during ICU rounds and with RN and MD.  Pt intubated, no family present. Pt had cortrak placed this am.  Pt failed extubation trial, noted to be COVID positive. Pt with severe sepsis with septic shock due to aspiration PNA with ARDS.   9/4 - s/p cortrak placement; tip near pylorus    Medications reviewed and include: decadron, colace, pepcid, heparin, SSI, miralax Precedex Levophed @ 6 mcg  Sodium bicarb  Labs reviewed CK: 47,715 CBG's: 122-162   Diet Order:   Diet Order             Diet NPO time specified  Diet effective now                   EDUCATION NEEDS:   Not appropriate for education at this time  Skin:  Skin Assessment: Reviewed RN Assessment  Last BM:  FMS placed early this am, 1 occurance; type 6  Height:   Ht Readings from Last 1 Encounters:  02/22/23 5\' 1"  (1.549 m)    Weight:   Wt Readings from Last 1 Encounters:  02/24/23 59.3 kg    BMI:  Body mass index is 24.7 kg/m.  Estimated Nutritional Needs:   Kcal:  2000-2200  Protein:  90-105 grams  Fluid:  >2 L/day  Cammy Copa., RD, LDN, CNSC See AMiON for contact information

## 2023-02-24 NOTE — Procedures (Signed)
Patient Name: Colleen Pearson  MRN: [id number]  Epilepsy Attending: Charlsie Quest  Referring Physician/Provider: Rejeana Brock, MD  Duration: 02/23/2023 4696 to 02/24/2023 2952   Patient history: 32 y.o. female with a history of seizure disorder, cocaine use who presents with status epilepticus. EEG to evaluate for seizure    Level of alertness: comatose-->awake, asleep   AEDs during EEG study: LEV, LTG, Phenobarb  Technical aspects: This EEG study was done with scalp electrodes positioned according to the 10-20 International system of electrode placement. Electrical activity was reviewed with band pass filter of 1-70Hz , sensitivity of 7 uV/mm, display speed of 64mm/sec with a 60Hz  notched filter applied as appropriate. EEG data were recorded continuously and digitally stored.  Video monitoring was available and reviewed as appropriate.   Description: EEG initially showed continuous generalized 3 to 6 Hz theta-delta slowing admixed with 13-15hz  beta activity distributed symmetrically and diffusely.  Gradually as sedation was weaned, EEG showed continuous generalized 3 to 5 Hz theta-delta slowing.  Sleep was characterized by vertex waves, sleep spindles (12 to 14 Hz), maximal frontocentral region.  hyperventilation and photic stimulation were not performed.      ABNORMALITY - Continuous slow, generalized   IMPRESSION: This study was initially suggestive of severe diffuse encephalopathy likely related to sedation.  Sedation was weaned, EEG improved and was suggestive of moderate diffuse encephalopathy.  No seizures or epileptiform discharges were seen throughout the recording.   [name]

## 2023-02-24 NOTE — Progress Notes (Signed)
0400 patient became a little restless and O2 saturation was noted to be in the high 80s. Earlier in the night patient was redirectable when restless and would calm back down with repositioning. Patient repositioned and O2 sats rose to 90%. Respiratory at bedside and patient became more restless and agitated, O2 sats dropped into the high 70s and fluctuated to the mid 80s. PRN ativan and fentanyl given for agitation at 0413, patient calmed but O2 sats remained in the mid-low 80s.   CCM contacted and orders initiated for increased respiratory support and mechanical intubation.  CCM rounding physicians on route and patient was intubated at bedside. During intubation, LR bolus given per CCM MD for blood pressure support with RSI medications.   Patient had difficulty voiding earlier in the shift, order for I+O cath, and then once reintubated order placed for indwelling catheter.

## 2023-02-25 ENCOUNTER — Inpatient Hospital Stay (HOSPITAL_COMMUNITY): Payer: Medicaid Other

## 2023-02-25 DIAGNOSIS — G40901 Epilepsy, unspecified, not intractable, with status epilepticus: Secondary | ICD-10-CM | POA: Diagnosis not present

## 2023-02-25 LAB — POCT I-STAT 7, (LYTES, BLD GAS, ICA,H+H)
Acid-Base Excess: 1 mmol/L (ref 0.0–2.0)
Acid-Base Excess: 0 mmol/L (ref 0.0–2.0)
Bicarbonate: 26.4 mmol/L (ref 20.0–28.0)
Bicarbonate: 25.3 mmol/L (ref 20.0–28.0)
Calcium, Ion: 1.08 mmol/L — ABNORMAL LOW (ref 1.15–1.40)
Calcium, Ion: 1.08 mmol/L — ABNORMAL LOW (ref 1.15–1.40)
HCT: 24 % — ABNORMAL LOW (ref 36.0–46.0)
HCT: 24 % — ABNORMAL LOW (ref 36.0–46.0)
Hemoglobin: 8.2 g/dL — ABNORMAL LOW (ref 12.0–15.0)
Hemoglobin: 8.2 g/dL — ABNORMAL LOW (ref 12.0–15.0)
O2 Saturation: 97 %
O2 Saturation: 99 %
Potassium: 3.7 mmol/L (ref 3.5–5.1)
Potassium: 4.2 mmol/L (ref 3.5–5.1)
Sodium: 139 mmol/L (ref 135–145)
Sodium: 140 mmol/L (ref 135–145)
TCO2: 28 mmol/L (ref 22–32)
TCO2: 27 mmol/L (ref 22–32)
pCO2 arterial: 46.3 mmHg (ref 32–48)
pCO2 arterial: 42.1 mmHg (ref 32–48)
pH, Arterial: 7.364 (ref 7.35–7.45)
pH, Arterial: 7.387 (ref 7.35–7.45)
pO2, Arterial: 98 mmHg (ref 83–108)
pO2, Arterial: 140 mmHg — ABNORMAL HIGH (ref 83–108)

## 2023-02-25 LAB — CULTURE, RESPIRATORY W GRAM STAIN

## 2023-02-25 LAB — URINALYSIS, ROUTINE W REFLEX MICROSCOPIC
Bilirubin Urine: NEGATIVE
Glucose, UA: >=500 mg/dL — AB
Ketones, ur: 5 mg/dL — AB
Leukocytes,Ua: NEGATIVE
Nitrite: NEGATIVE
Protein, ur: 100 mg/dL — AB
Specific Gravity, Urine: 1.026 (ref 1.005–1.030)
pH: 7 (ref 5.0–8.0)

## 2023-02-25 LAB — BASIC METABOLIC PANEL
Anion gap: 13 (ref 5–15)
BUN: 26 mg/dL — ABNORMAL HIGH (ref 6–20)
CO2: 22 mmol/L (ref 22–32)
Calcium: 7.4 mg/dL — ABNORMAL LOW (ref 8.9–10.3)
Chloride: 102 mmol/L (ref 98–111)
Creatinine, Ser: 1.34 mg/dL — ABNORMAL HIGH (ref 0.44–1.00)
GFR, Estimated: 54 mL/min — ABNORMAL LOW (ref >60)
Glucose, Bld: 251 mg/dL — ABNORMAL HIGH (ref 70–99)
Potassium: 3.4 mmol/L — ABNORMAL LOW (ref 3.5–5.1)
Sodium: 137 mmol/L (ref 135–145)

## 2023-02-25 LAB — GLUCOSE, CAPILLARY
Glucose-Capillary: 256 mg/dL — ABNORMAL HIGH (ref 70–99)
Glucose-Capillary: 257 mg/dL — ABNORMAL HIGH (ref 70–99)
Glucose-Capillary: 184 mg/dL — ABNORMAL HIGH (ref 70–99)
Glucose-Capillary: 189 mg/dL — ABNORMAL HIGH (ref 70–99)
Glucose-Capillary: 243 mg/dL — ABNORMAL HIGH (ref 70–99)

## 2023-02-25 LAB — CBC
HCT: 24.7 % — ABNORMAL LOW (ref 36.0–46.0)
Hemoglobin: 8.6 g/dL — ABNORMAL LOW (ref 12.0–15.0)
MCH: 29.4 pg (ref 26.0–34.0)
MCHC: 34.8 g/dL (ref 30.0–36.0)
MCV: 84.3 fL (ref 80.0–100.0)
Platelets: 169 10*3/uL (ref 150–400)
RBC: 2.93 MIL/uL — ABNORMAL LOW (ref 3.87–5.11)
RDW: 13.2 % (ref 11.5–15.5)
WBC: 9.3 10*3/uL (ref 4.0–10.5)
nRBC: 0 % (ref 0.0–0.2)

## 2023-02-25 LAB — MYOGLOBIN, URINE: Myoglobin, Ur: 5407 ng/mL — ABNORMAL HIGH (ref 0–13)

## 2023-02-25 LAB — PROCALCITONIN: Procalcitonin: 15.64 ng/mL

## 2023-02-25 LAB — CK: Total CK: 30176 U/L — ABNORMAL HIGH (ref 38–234)

## 2023-02-25 LAB — VANCOMYCIN, RANDOM: Vancomycin Rm: 6 ug/mL

## 2023-02-25 MED ORDER — POTASSIUM CHLORIDE 20 MEQ PO PACK
40.0000 meq | PACK | Freq: Once | ORAL | Status: AC
  Administered 2023-02-25: 40 meq
  Filled 2023-02-25: qty 2

## 2023-02-25 MED ORDER — POTASSIUM PHOSPHATES 15 MMOLE/5ML IV SOLN
15.0000 mmol | Freq: Once | INTRAVENOUS | Status: AC
  Administered 2023-02-25: 15 mmol via INTRAVENOUS
  Filled 2023-02-25: qty 5

## 2023-02-25 MED ORDER — INSULIN GLARGINE-YFGN 100 UNIT/ML ~~LOC~~ SOLN
10.0000 [IU] | Freq: Every day | SUBCUTANEOUS | Status: DC
  Filled 2023-02-25: qty 0.1

## 2023-02-25 MED ORDER — PROPOFOL 1000 MG/100ML IV EMUL
5.0000 ug/kg/min | INTRAVENOUS | Status: DC
  Administered 2023-02-25: 5 ug/kg/min via INTRAVENOUS
  Filled 2023-02-25: qty 100

## 2023-02-25 MED ORDER — INSULIN GLARGINE-YFGN 100 UNIT/ML ~~LOC~~ SOLN
10.0000 [IU] | Freq: Two times a day (BID) | SUBCUTANEOUS | Status: DC
  Administered 2023-02-25 – 2023-03-01 (×8): 10 [IU] via SUBCUTANEOUS
  Filled 2023-02-25 (×10): qty 0.1

## 2023-02-25 MED ORDER — PROPOFOL 1000 MG/100ML IV EMUL
INTRAVENOUS | Status: AC
  Filled 2023-02-25: qty 100

## 2023-02-25 MED ORDER — LEVETIRACETAM IN NACL 1000 MG/100ML IV SOLN
1000.0000 mg | Freq: Two times a day (BID) | INTRAVENOUS | Status: DC
  Administered 2023-02-25 – 2023-03-01 (×9): 1000 mg via INTRAVENOUS
  Filled 2023-02-25 (×11): qty 100

## 2023-02-25 MED ORDER — LACTATED RINGERS IV SOLN: INTRAVENOUS | Status: DC

## 2023-02-25 MED ORDER — PROPOFOL 1000 MG/100ML IV EMUL
5.0000 ug/kg/min | INTRAVENOUS | Status: AC
  Administered 2023-02-25: 10 ug/kg/min via INTRAVENOUS
  Filled 2023-02-25: qty 100

## 2023-02-25 MED ORDER — VANCOMYCIN HCL IN DEXTROSE 1-5 GM/200ML-% IV SOLN
1000.0000 mg | INTRAVENOUS | Status: DC
  Administered 2023-02-25 – 2023-03-01 (×5): 1000 mg via INTRAVENOUS
  Filled 2023-02-25 (×7): qty 200

## 2023-02-25 NOTE — Progress Notes (Signed)
[facility] ADULT ICU REPLACEMENT PROTOCOL   The patient does apply for the [facility] Adult ICU Electrolyte Replacment Protocol based on the criteria listed below:   1.Exclusion criteria: TCTS, ECMO, Dialysis, and Myasthenia Gravis patients 2. Is GFR >/= 30 ml/min? Yes.    Patient's GFR today is 54 3. Is SCr </= 2? Yes.   Patient's SCr is 1.34 mg/dL 4. Did SCr increase >/= 0.5 in 24 hours? No. 5.Pt's weight >40kg  Yes.   6. Abnormal electrolyte(s): Potassium 3.4  7. Electrolytes replaced per protocol 8.  Call MD STAT for K+ </= 2.5, Phos </= 1, or Mag </= 1 Physician:  Dr. Faye Ramsay A Seann Genther 02/25/2023 5:38 AM

## 2023-02-25 NOTE — Progress Notes (Signed)
NAME:  Colleen Pearson, MRN:  [id number], DOB:  Feb 04, 1991, LOS: 3 ADMISSION DATE:  02/22/2023, CONSULTATION DATE:  9/2 REFERRING MD:  Blinda Leatherwood, CHIEF COMPLAINT:  statis epilepticus    History of Present Illness:   This is a 32 year old female patient with a history of epilepsy, for which she takes Keppra 1000 mg twice daily, last breakthrough seizure noted in June 2024.  Presents to the hospital via EMS after family contacted them for prolonged seizure.  Reportedly seizure lasted for approximately 45 minutes. She was still in status epilepticus on EMS arrival requiring multiple doses of midazolam prehospital.  On initial evaluation by EDP patient was unresponsive, requiring assisted ventilation via mask.  She was intubated loaded with IV Keppra seen by neurology, CT head was negative, additionally received a total of 4 L of crystalloid for intermittent hypotension UDS positive for THC.   Additional ER evaluation demonstrated the following: White blood cell count 18.1, sodium 131, bicarbonate less than 7, glucose 274, creatinine 1.74, lipase 46 beta hydroxybutyric acid was negative at 0.12 initial total CKs 252 urine pregnancy negative initial venous blood gas showed pH of 6.84 pCO2 of 37 and HCO3 of 6.3 her lactate was > 15.   Critical care asked to admit Pertinent  Medical History  Seizure disorder, followed at [facility] Events: Including procedures, antibiotic start and stop dates in addition to other pertinent events   32 year old female patient admitted in status epilepticus intubated in the emergency room, received 4 L of crystalloid for intermittent hypotension.  Loaded with Keppra, , started on ceftriaxone for possible aspiration 9/2 ceribell EEG suggesting focal seizure episode at 03 26  Interim History / Subjective:  On vent heavily sedated.  Objective   Blood pressure 98/73, pulse 77, temperature 97.6 F (36.4 C), temperature source Axillary, resp. rate (!) 26,  height 5\' 1"  (1.549 m), weight 62.4 kg, SpO2 98%.    Vent Mode: PRVC FiO2 (%):  [70 %] 70 % Set Rate:  [26 bmp] 26 bmp Vt Set:  [400 mL] 400 mL PEEP:  [5 cmH20-10 cmH20] 5 cmH20 Plateau Pressure:  [14 cmH20-23 cmH20] 22 cmH20   Intake/Output Summary (Last 24 hours) at 02/25/2023 0800 Last data filed at 02/25/2023 0700 Gross per 24 hour  Intake 4397.03 ml  Output 1500 ml  Net 2897.03 ml   Filed Weights   02/23/23 0500 02/24/23 0702 02/25/23 0343  Weight: 66.5 kg 59.3 kg 62.4 kg    Examination: Poorly responsive  Pupils small discongjugate gaze but on heavily sedation Lungs scattered rhonci Woke up and followed commands at one point but then needed heavy sedation to help vent compliance Ext warm without edema, heart sounds regular  Potassium repleted Sugars up with dexamethasone CBC benign  Resolved Hospital Problem list     Assessment & Plan:  Status epilepticus AEDs and cEEG per neuro Wean as able  Acute respiratory failure with hypoxia and hypercapnia Severe sepsis with septic shock due to aspiration pneumonia with ARDS, not POA COVID pneumonia Staph pneumonia Vent bundle Vanc/zosyn, narrow pending final culture data Will let rest today and consider extubation tomorrow (fio2 needs still a bit high)   Severe anion gap/lactic acidosis and following prolonged refractory seizure , improving AKI after prolonged seizure.   Severe acute rhabdomyolysis Switch bicarb gtt to LR Check ABG, reduce mandatory ventilation as able  Overall improved, will touch base w/ neuro RE: EEG findings, if benign can start weaning again as early as tomorrow  Best  Practice (right click and "Reselect all SmartList Selections" daily)   Diet/type: Tube feeds DVT prophylaxis: prophylactic heparin  GI prophylaxis: H2B Lines: N/A Foley:  Yes, and it is still needed Code Status:  full code Last date of multidisciplinary goals of care discussion [9/5: father updated on phone]  32 min cc  time Myrla Halsted MD [facility] See Amion for pager If no response to pager, please call [phone] until 7pm After 7pm, Please call E-link [phone]

## 2023-02-25 NOTE — Progress Notes (Signed)
Neurology Progress Note  Brief HPI:  Colleen Pearson is a 32 y.o. female with a past medical history of epilepsy on Keppra 1000 mg twice daily and Lamictal 150 mg twice daily.  She currently follows with Dr. Malvin Johns at the [place] [facility].  She was brought to the hospital via EMS after family called due to a prolonged seizure.  The seizure reportedly lasted for approximately 45 minutes and she was still in status on arrival to the hospital.  She was intubated, loaded with Keppra and she is now on LTM. Extubated on 9/3 and re intubated 9/4. COVID positive.   Med reconciliation done by pharmacy and patient is on lamictal 150mg  BID despite allergy listed in Epic per Dr. Malvin Johns at [place] [facility]. Last visit October 14, 2022 when dose was increased to 150mg  BID.   Current AEDs: Keppra 1000 mg twice daily Lamictal 150 mg twice daily  Subjective: Seen and examined. Remains non purposeful on exam when awake. Has been needing sedation for vent synchrony  Exam: Vitals:   02/25/23 0900 02/25/23 1000  BP: 104/71 103/71  Pulse: 82 (!) 102  Resp: (!) 29 (!) 8  Temp:    SpO2: 97% 96%   General: Remains intubated, on propofol 5 Neurological exam Sedated intubated No spontaneous movements Pupils equal round reactive to light Mildly disconjugate gaze, which is better than a few days ago. No spontaneous movement No movement to noxious stimulation She is breathing over the ventilator   Pertinent Labs: CBC: WBC 12.0, hemoglobin 10.2, platelet count 196,000. CMP: Sodium 134, potassium 3.5, glucose 120, chloride 110, bicarb 17.  Creatinine 1.58.  Magnesium 2.3. U-Tox: +THC and +Benzo(likely from EMS) Prior Utox 03/23 was  + for amphetamines, cocaine, benzo, THC and cannabinoids. COVID positive  CK: 252 -> 3,583 -> 15,317 -> 47,715-.>  30176  Imaging Reviewed:  LTM EEG from 02/24/2023 to 02/25/2023 with no evidence of seizures.  Continuous generalized slowing seen.  Assessment:  32 year old  female found to be in status epilepticus by family-reportedly seizing upwards of 45 minutes.   Status broken with benzos and Keppra load.   Currently on Keppra 1000 mg twice daily and Lamictal 150 mg twice daily.  Urine tox positive for THC.  She was extubated yesterday but needed reintubation for respiratory distress.  Concern for pneumonia versus ARDS.  Also has CK that has been rising. She will extubated, then reintubated for the arts.  Remains intubated.  On minimal sedation has extremely poor exam.  EEG with no evidence of seizures and only generalized slowing. Respiratory swab also positive for COVID-19.  Might have lowered seizure threshold and poor exam is likely due to acute illness. CK downtrending  Impression:  Status epilepticus - treated with benzodiazepines Breakthrough seizure caused by [facility] use  Recommendations: Continue current seizure regimen with Keppra and Lamictal Maintain seizure precautions Supportive management including management of respiratory status and respiratory infection as well as hyper CKemia per primary team as you are Try to lower sedation as tolerated We will leave on LTM EEG as sedation is being lowered because I do not want to miss electrographic seizures given the fact that she came in with extremely prolonged seizure activity. Preliminary plan discussed with PCCM attending in the morning. Neurology will continue to follow with you.  -- Milon Dikes, MD Neurologist [facility] Pager: [phone]  CRITICAL CARE ATTESTATION Performed by: Milon Dikes, MD Total critical care time: 30 minutes Critical care time was exclusive of separately billable procedures and treating other  patients and/or supervising APPs/Residents/Students Critical care was necessary to treat or prevent imminent or life-threatening deterioration. This patient is critically ill and at significant risk for neurological worsening and/or death and care requires constant  monitoring. Critical care was time spent personally by me on the following activities: development of treatment plan with patient and/or surrogate as well as nursing, discussions with consultants, evaluation of patient's response to treatment, examination of patient, obtaining history from patient or surrogate, ordering and performing treatments and interventions, ordering and review of laboratory studies, ordering and review of radiographic studies, pulse oximetry, re-evaluation of patient's condition, participation in multidisciplinary rounds and medical decision making of high complexity in the care of this patient.

## 2023-02-25 NOTE — Progress Notes (Signed)
PT Cancellation Note  Patient Details Name: Colleen Pearson MRN: [id number] DOB: 06-03-1991   Cancelled Treatment:    Reason Eval/Treat Not Completed: (P) Patient not medically ready. RN requesting hold on PT, pt currently intubated and sedated with plans to potentially extubate pt tomorrow. Will plan to follow-up tomorrow as able.   Virgil Benedict, PT, DPT Acute Rehabilitation Services  Office: [phone]    Bettina Gavia 02/25/2023, 9:18 AM

## 2023-02-25 NOTE — Progress Notes (Signed)
Study checked and looks good

## 2023-02-25 NOTE — Procedures (Signed)
Patient Name: Colleen Pearson  MRN: [id number]  Epilepsy Attending: Charlsie Quest  Referring Physician/Provider: Rejeana Brock, MD  Duration: 02/24/2023 0350 to 02/25/2023 0938   Patient history: 32 y.o. female with a history of seizure disorder, cocaine use who presents with status epilepticus. EEG to evaluate for seizure    Level of alertness: awake, asleep   AEDs during EEG study: LEV, LTG  Technical aspects: This EEG study was done with scalp electrodes positioned according to the 10-20 International system of electrode placement. Electrical activity was reviewed with band pass filter of 1-70Hz , sensitivity of 7 uV/mm, display speed of 55mm/sec with a 60Hz  notched filter applied as appropriate. EEG data were recorded continuously and digitally stored.  Video monitoring was available and reviewed as appropriate.   Description: EEG showed continuous generalized 3 to 5 Hz theta-delta slowing.  Sleep was characterized by vertex waves, sleep spindles (12 to 14 Hz), maximal frontocentral region.  hyperventilation and photic stimulation were not performed.      ABNORMALITY - Continuous slow, generalized   IMPRESSION: This study was suggestive of moderate diffuse encephalopathy.  No seizures or epileptiform discharges were seen throughout the recording.   Idil Maslanka [name]

## 2023-02-25 NOTE — Progress Notes (Signed)
Pharmacy Antibiotic Note  Colleen Pearson is a 32 y.o. female admitted on 02/22/2023 with  seizures, new aspiration pneumonia .  9/4 CXR demonstrated worsening opacities compared to previous imaging and patient was re-intubated overnight. Pharmacy has been consulted for vancomycin/zosyn dosing.  Plan: Zosyn 3.375g IV q8h (4 hour infusion). Schedule vancomycin 1g q24h for estimated AUC 528, trough 13.3 with improving renal function Discontinue ceftriaxone  Vanc levels once at steady state (today is day 2, dose 2)  Height: 5\' 1"  (154.9 cm) Weight: 62.4 kg (137 lb 9.1 oz) IBW/kg (Calculated) : 47.8  Temp (24hrs), Avg:99.6 F (37.6 C), Min:97.6 F (36.4 C), Max:101.2 F (38.4 C)  Recent Labs  Lab 02/22/23 0007 02/22/23 0020 02/22/23 0245 02/22/23 0251 02/22/23 0413 02/22/23 1219 02/23/23 0750 02/23/23 0915 02/24/23 0613 02/25/23 0409  WBC 18.1*  --  13.8*  --  12.0*  --  18.4*  --   --  9.3  CREATININE 1.74* 1.60* 1.40*  --  1.58*  --   --  1.64* 1.81* 1.34*  LATICACIDVEN  --  >15.0*  --  6.3*  --  2.8*  --   --   --   --   VANCORANDOM  --   --   --   --   --   --   --   --   --  6    Estimated Creatinine Clearance: 51 mL/min (A) (by C-G formula based on SCr of 1.34 mg/dL (H)).    Allergies  Allergen Reactions   Penicillins Hives and Swelling   Tramadol Hives and Swelling    Antimicrobials this admission: CTX 9/2 > 9/4 Zosyn 9/4 > Vanc 9/4 >  Microbiology results: 9/2 BCX > ng2d 9/2 TA CX> no organisms  9/2 MRSA PCR > positive 9/4 COVID > positive  Thank you for allowing pharmacy to be a part of this patient's care.  Rutherford Nail, PharmD PGY2 Critical Care Pharmacy Resident 02/25/2023 7:05 AM

## 2023-02-26 DIAGNOSIS — G40901 Epilepsy, unspecified, not intractable, with status epilepticus: Secondary | ICD-10-CM | POA: Diagnosis not present

## 2023-02-26 LAB — BASIC METABOLIC PANEL
Anion gap: 6 (ref 5–15)
BUN: 23 mg/dL — ABNORMAL HIGH (ref 6–20)
CO2: 24 mmol/L (ref 22–32)
Calcium: 7.4 mg/dL — ABNORMAL LOW (ref 8.9–10.3)
Chloride: 111 mmol/L (ref 98–111)
Creatinine, Ser: 1.11 mg/dL — ABNORMAL HIGH (ref 0.44–1.00)
GFR, Estimated: >60 mL/min (ref >60)
Glucose, Bld: 126 mg/dL — ABNORMAL HIGH (ref 70–99)
Potassium: 4.7 mmol/L (ref 3.5–5.1)
Sodium: 141 mmol/L (ref 135–145)

## 2023-02-26 LAB — CK: Total CK: 28375 U/L — ABNORMAL HIGH (ref 38–234)

## 2023-02-26 LAB — CBC
HCT: 24.5 % — ABNORMAL LOW (ref 36.0–46.0)
Hemoglobin: 8 g/dL — ABNORMAL LOW (ref 12.0–15.0)
MCH: 28.8 pg (ref 26.0–34.0)
MCHC: 32.7 g/dL (ref 30.0–36.0)
MCV: 88.1 fL (ref 80.0–100.0)
Platelets: 187 10*3/uL (ref 150–400)
RBC: 2.78 MIL/uL — ABNORMAL LOW (ref 3.87–5.11)
RDW: 14 % (ref 11.5–15.5)
WBC: 12.8 10*3/uL — ABNORMAL HIGH (ref 4.0–10.5)
nRBC: 0 % (ref 0.0–0.2)

## 2023-02-26 LAB — GLUCOSE, CAPILLARY
Glucose-Capillary: 119 mg/dL — ABNORMAL HIGH (ref 70–99)
Glucose-Capillary: 129 mg/dL — ABNORMAL HIGH (ref 70–99)
Glucose-Capillary: 132 mg/dL — ABNORMAL HIGH (ref 70–99)
Glucose-Capillary: 152 mg/dL — ABNORMAL HIGH (ref 70–99)
Glucose-Capillary: 163 mg/dL — ABNORMAL HIGH (ref 70–99)
Glucose-Capillary: 167 mg/dL — ABNORMAL HIGH (ref 70–99)

## 2023-02-26 LAB — TRIGLYCERIDES: Triglycerides: 161 mg/dL — ABNORMAL HIGH (ref <150)

## 2023-02-26 MED ORDER — INSULIN ASPART 100 UNIT/ML IJ SOLN
3.0000 [IU] | INTRAMUSCULAR | Status: DC
  Administered 2023-02-26 – 2023-03-01 (×18): 3 [IU] via SUBCUTANEOUS

## 2023-02-26 MED ORDER — QUETIAPINE FUMARATE 25 MG PO TABS
50.0000 mg | ORAL_TABLET | Freq: Every day | ORAL | Status: DC
  Administered 2023-02-26: 50 mg
  Filled 2023-02-26: qty 2

## 2023-02-26 MED ORDER — QUETIAPINE FUMARATE 25 MG PO TABS
50.0000 mg | ORAL_TABLET | Freq: Once | ORAL | Status: AC
  Administered 2023-02-26: 50 mg
  Filled 2023-02-26: qty 2

## 2023-02-26 MED ORDER — FUROSEMIDE 10 MG/ML IJ SOLN
40.0000 mg | Freq: Four times a day (QID) | INTRAMUSCULAR | Status: AC
  Administered 2023-02-26 (×2): 40 mg via INTRAVENOUS
  Filled 2023-02-26 (×2): qty 4

## 2023-02-26 MED ORDER — PROPOFOL 1000 MG/100ML IV EMUL: 5.0000 ug/kg/min | INTRAVENOUS | Status: DC

## 2023-02-26 MED ORDER — KETAMINE HCL-SODIUM CHLORIDE 1000-0.9 MG/100ML-% IV SOLN
0.5000 mg/kg/h | INTRAVENOUS | Status: DC
  Administered 2023-02-26: 0.5 mg/kg/h via INTRAVENOUS
  Filled 2023-02-26: qty 100

## 2023-02-26 MED ORDER — HALOPERIDOL LACTATE 5 MG/ML IJ SOLN
5.0000 mg | Freq: Four times a day (QID) | INTRAMUSCULAR | Status: DC | PRN
  Administered 2023-02-26 – 2023-02-28 (×6): 5 mg via INTRAVENOUS
  Filled 2023-02-26 (×9): qty 1

## 2023-02-26 NOTE — Progress Notes (Signed)
Neurology Progress Note  Brief HPI:  Colleen Pearson is a 32 y.o. female with a past medical history of epilepsy on Keppra 1000 mg twice daily and Lamictal 150 mg twice daily.  She currently follows with Dr. Malvin Johns at the [place] [facility].  She was brought to the hospital via EMS after family called due to a prolonged seizure.  The seizure reportedly lasted for approximately 45 minutes and she was still in status on arrival to the hospital.  She was intubated, loaded with Keppra and she is now on LTM. Extubated on 9/3 and re intubated 9/4. COVID positive.   Med reconciliation done by pharmacy and patient is on lamictal 150mg  BID despite allergy listed in Epic per Dr. Malvin Johns at [place] [facility]. Last visit October 14, 2022 when dose was increased to 150mg  BID.   Current AEDs: Keppra 1000 mg twice daily Lamictal 150 mg twice daily  Subjective: Seen in room, no family at the bedside. Extubated approximately 10 minutes prior to exam. Ketamine infusing, having some hallucinations  Exam: Current vital signs: BP (!) 112/52 (BP Location: Right Arm)   Pulse (!) 152   Temp 100.2 F (37.9 C) (Axillary)   Resp (!) 23   Ht 5\' 1"  (1.549 m)   Wt 62.4 kg   SpO2 (!) 87%   BMI 25.99 kg/m  Vital signs in last 24 hours: Temp:  [97.7 F (36.5 C)-101.2 F (38.4 C)] 100.2 F (37.9 C) (09/06 0840) Pulse Rate:  [85-152] 152 (09/06 0800) Resp:  [8-26] 23 (09/06 0800) BP: (90-112)/(52-85) 112/52 (09/06 0800) SpO2:  [86 %-100 %] 87 % (09/06 0800) Arterial Line BP: (91-144)/(-19-73) 91/70 (09/06 0500) FiO2 (%):  [40 %-60 %] 50 % (09/06 0227)    Physical Exam  Constitutional: Appears well-developed and well-nourished.   Cardiovascular: Normal rate and regular rhythm.  Respiratory: Effort normal, non-labored breathing  Neuro: Mental Status: Eyes open, voice is hoarse. States yes and no inconsistently. Does not follow commands. Does not answer orientation questions  Cranial Nerves: II: Pupils are 2mm  brisk III,IV, VI: Eyes midline, does not track VII: Facial movement is symmetric  VIII: Hearing is intact to voice X: Cough and gag intact XI: Head is midline Motor:  Bulk is normal. Moves all extremities antigravity. Does not participate in confrontational strength testing  Sensory: Withdraws to pain in all extremities  Cerebellar: Unable to complete due to patient participation  Pertinent Labs: CBC: WBC 12.0, hemoglobin 10.2, platelet count 196,000. CMP: Sodium 134, potassium 3.5, glucose 120, chloride 110, bicarb 17.  Creatinine 1.58.  Magnesium 2.3. U-Tox: +THC and +Benzo(likely from EMS) Prior Utox 03/23 was  + for amphetamines, cocaine, benzo, THC and cannabinoids. COVID positive  CK: 252 -> 3,583 -> 15,317 -> 47,715-.>  30176  Imaging Reviewed:  LTM EEG from 02/24/2023 to 02/25/2023 with no evidence of seizures.  Continuous generalized slowing seen.  Head CT 9/2 no acute intracranial process   Assessment:  32 year old female found to be in status epilepticus by family-reportedly seizing upwards of 45 minutes.   Status broken with benzos and Keppra load.   Currently on Keppra 1000 mg twice daily and Lamictal 150 mg twice daily.  Urine tox positive for THC.  She was extubated but needed reintubation for respiratory distress. Extubated again on 9/6 Concern for pneumonia versus ARDS.  Respiratory swab also positive for COVID-19.  Might have lowered seizure threshold and poor exam is likely due to acute illness with component of delirium CK downtrending  Impression:  Status epilepticus - treated with benzodiazepines Breakthrough seizure caused by THC use and COVID infection Delirium secondary to acute illness, medications, hospitalization Rhabdomyolysis secondary to seizure and potentially infection, improving  Recommendations: Continue current seizure regimen with Keppra 1000 BID and Lamictal 150 mg BID Maintain inpatient seizure precautions Supportive management including  management of respiratory status and respiratory infection as well as hyper CKemia per primary team as you are Include seizure precautions in discharge instructions and review with patient as mental status improves and delirium clears If LTM EEG is negative for seizure activity, will discontinue LTM and neurology will sign off. Please don't hesitate to reach out if additional questions/concerns arise   Standard seizure precautions: Per [facility] statutes, patients with seizures are not allowed to drive until  they have been seizure-free for six months. Use caution when using heavy equipment or power tools. Avoid working on ladders or at heights. Take showers instead of baths. Ensure the water temperature is not too high on the home water heater. Do not go swimming alone. When caring for infants or small children, sit down when holding, feeding, or changing them to minimize risk of injury to the child in the event you have a seizure.  To reduce risk of seizures, maintain good sleep hygiene avoid alcohol and illicit drug use, take all anti-seizure medications as prescribed.   Preliminary plan discussed with PCCM attending in the morning. Neurology will continue to follow with you. Seen by NP Elmer Picker and by attending MD below  --  Attending Neurologist's note:  I personally saw this patient, gathering history, performing a full neurologic examination, reviewing relevant labs, personally reviewing relevant imaging, and formulated the assessment and plan, adding the note above for completeness and clarity to accurately reflect my thoughts  Brooke Dare MD-PhD [facility] [phone] Available 7 AM to 7 PM, outside these hours please contact Neurologist on call listed on AMION   CRITICAL CARE Performed by: Gordy Councilman   Total critical care time: 35 minutes  Critical care time was exclusive of separately billable procedures and treating other  patients.  Critical care was necessary to treat or prevent imminent or life-threatening deterioration.  Critical care was time spent personally by me on the following activities: development of treatment plan with patient and/or surrogate as well as nursing, discussions with consultants, evaluation of patient's response to treatment, examination of patient, obtaining history from patient or surrogate, ordering and performing treatments and interventions, ordering and review of laboratory studies, ordering and review of radiographic studies, pulse oximetry and re-evaluation of patient's condition.

## 2023-02-26 NOTE — Progress Notes (Signed)
RT at bedside due to pt desat. RT placed pt on heated HFNC 30L 100% SpO2 remained in 80s. RT placed pt on NRB due to mouth breathing, SpO2 increased 92%. RN aware, CCM MD aware, RT will monitor as needed.     02/26/23 1007  Therapy Vitals  Pulse Rate (!) 102  Resp 20  MEWS Score/Color  MEWS Score 3  MEWS Score Color Yellow  Respiratory Assessment  Assessment Type Assess only  Respiratory Pattern Regular;Labored;Tachypnea  Chest Assessment Chest expansion symmetrical  Cough Non-productive  R Upper  Breath Sounds Diminished;Rhonchi  Oxygen Therapy/Pulse Ox  O2 Device (S)  Non-rebreather Mask  O2 Flow Rate (L/min) 15 L/min  FiO2 (%) 100 %  SpO2 92 %

## 2023-02-26 NOTE — Procedures (Signed)
Extubation Procedure Note  Patient Details:   Name: Colleen Pearson DOB: 19-Mar-1991 MRN: [id number]   Airway Documentation:    Vent end date: 02/26/23 Vent end time: 0751   Evaluation  O2 sats: stable throughout Complications: No apparent complications Patient did tolerate procedure well. Bilateral Breath Sounds: Diminished   Yes  Pt extubated to 15L HFNC, per order given by CCM MD at bedside. Pt was very  aggravated, tachypniec and tachycardiac prior to extubation. Post extubation RR 22, SpO2 94%, RN at bedside, CCM MD aware, RT will monitor as needed.   Thornell Mule 02/26/2023, 7:54 AM

## 2023-02-26 NOTE — Progress Notes (Signed)
NAME:  Colleen Pearson, MRN:  [id number], DOB:  1991/03/17, LOS: 4 ADMISSION DATE:  02/22/2023, CONSULTATION DATE:  9/2 REFERRING MD:  Blinda Leatherwood, CHIEF COMPLAINT:  statis epilepticus    History of Present Illness:   This is a 32 year old female patient with a history of epilepsy, for which she takes Keppra 1000 mg twice daily, last breakthrough seizure noted in June 2024.  Presents to the hospital via EMS after family contacted them for prolonged seizure.  Reportedly seizure lasted for approximately 45 minutes. She was still in status epilepticus on EMS arrival requiring multiple doses of midazolam prehospital.  On initial evaluation by EDP patient was unresponsive, requiring assisted ventilation via mask.  She was intubated loaded with IV Keppra seen by neurology, CT head was negative, additionally received a total of 4 L of crystalloid for intermittent hypotension UDS positive for THC.   Additional ER evaluation demonstrated the following: White blood cell count 18.1, sodium 131, bicarbonate less than 7, glucose 274, creatinine 1.74, lipase 46 beta hydroxybutyric acid was negative at 0.12 initial total CKs 252 urine pregnancy negative initial venous blood gas showed pH of 6.84 pCO2 of 37 and HCO3 of 6.3 her lactate was > 15.   Critical care asked to admit Pertinent  Medical History  Seizure disorder, followed at [facility] Events: Including procedures, antibiotic start and stop dates in addition to other pertinent events   32 year old female patient admitted in status epilepticus intubated in the emergency room, received 4 L of crystalloid for intermittent hypotension.  Loaded with Keppra, , started on ceftriaxone for possible aspiration 9/2 ceribell EEG suggesting focal seizure episode at 03 26  Interim History / Subjective:  She's a bit wild.  Not following commands.  Good TV on PS.  Objective   Blood pressure (!) 90/59, pulse 85, temperature 97.7 F (36.5 C),  temperature source Axillary, resp. rate 18, height 5\' 1"  (1.549 m), weight 62.4 kg, SpO2 97%.    Vent Mode: PRVC;SIMV;PSV FiO2 (%):  [40 %-60 %] 50 % Set Rate:  [18 bmp-20 bmp] 18 bmp Vt Set:  [380 mL] 380 mL PEEP:  [5 cmH20] 5 cmH20 Pressure Support:  [5 cmH20-10 cmH20] 10 cmH20   Intake/Output Summary (Last 24 hours) at 02/26/2023 0810 Last data filed at 02/26/2023 0600 Gross per 24 hour  Intake 4918.59 ml  Output 1900 ml  Net 3018.59 ml   Filed Weights   02/23/23 0500 02/24/23 0702 02/25/23 0343  Weight: 66.5 kg 59.3 kg 62.4 kg    Examination: Moving everything nonpurposeful Harsh rhonci Ext warm Abd soft Pupils equal Not following commands  Labs pending Ammonia mildly up  Resolved Hospital Problem list     Assessment & Plan:  Status epilepticus AEDs and cEEG per neuro Wean as able  Postictal delirium, polysubstance abuse hx and suspicion for current - Encourage day/night cycles - Encourage family visitation  Acute respiratory failure with hypoxia and hypercapnia Severe sepsis with septic shock due to aspiration pneumonia with ARDS, not POA COVID pneumonia MRSA pneumonia - Trial of extubation, mental status is biggest issue here, going to try precedex/ketamine, at bedtime seroquel with ativan +/- haldol PRN as bridge   Severe anion gap/lactic acidosis and following prolonged refractory seizure , improving AKI after prolonged seizure.   Severe acute rhabdomyolysis - f/u am labs, adjust fluids PRN  Will see how she does with extubation then reach out to family  Best Practice (right click and "Reselect all SmartList Selections" daily)  Diet/type: Tube feeds DVT prophylaxis: prophylactic heparin  GI prophylaxis: H2B Lines: N/A Foley:  Yes, and it is still needed Code Status:  full code Last date of multidisciplinary goals of care discussion [9/5: father updated on phone]  31 min cc time Myrla Halsted MD [facility] See Amion for  pager If no response to pager, please call [phone] until 7pm After 7pm, Please call E-link [phone]

## 2023-02-26 NOTE — Progress Notes (Signed)
   02/26/23 2114  BiPAP/CPAP/SIPAP  BiPAP/CPAP/SIPAP Pt Type Adult  BiPAP/CPAP/SIPAP SERVO  Reason BIPAP/CPAP not in use Other(comment) (Pt in restraints, saftey hazard, aspiration risk)

## 2023-02-26 NOTE — Progress Notes (Signed)
LTM maint complete - no skin breakdown under: A2. Pt a little agitated tihs morning. All impedances are balanced.

## 2023-02-26 NOTE — Procedures (Signed)
Patient Name: Colleen Pearson  MRN: [id number]  Epilepsy Attending: Charlsie Quest  Referring Physician/Provider: Rejeana Brock, MD  Duration: 02/26/2023 4782 to 02/26/2023 1547   Patient history: 32 y.o. female with a history of seizure disorder, cocaine use who presents with status epilepticus. EEG to evaluate for seizure    Level of alertness: lethargic, asleep   AEDs during EEG study: LEV, LTG   Technical aspects: This EEG study was done with scalp electrodes positioned according to the 10-20 International system of electrode placement. Electrical activity was reviewed with band pass filter of 1-70Hz , sensitivity of 7 uV/mm, display speed of 63mm/sec with a 60Hz  notched filter applied as appropriate. EEG data were recorded continuously and digitally stored.  Video monitoring was available and reviewed as appropriate.   Description: During awake state, no clear posterior rhythm was seen.  EEG showed continuous generalized 3-7 Doxytab and delta slowing.  During sleep, EEG showed continuous generalized rhythmic low amplitude 2-3Hz  delta slowing admixed with sleep spindles (12 to 14 Hz), maximal frontocentral region. Hyperventilation and photic stimulation were not performed.      ABNORMALITY - Continuous slow, generalized   IMPRESSION: This study was suggestive of moderate diffuse encephalopathy. No seizures or epileptiform discharges were seen throughout the recording.   Colleen Pearson

## 2023-02-26 NOTE — Progress Notes (Signed)
LTM EEG discontinued - no skin breakdown at unhook.   

## 2023-02-26 NOTE — Procedures (Addendum)
Patient Name: Colleen Pearson  MRN: [id number]  Epilepsy Attending: Charlsie Quest  Referring Physician/Provider: Rejeana Brock, MD  Duration: 02/25/2023 1478 to 02/26/2023 2956   Patient history: 32 y.o. female with a history of seizure disorder, cocaine use who presents with status epilepticus. EEG to evaluate for seizure    Level of alertness: lethargic, asleep   AEDs during EEG study: LEV, LTG   Technical aspects: This EEG study was done with scalp electrodes positioned according to the 10-20 International system of electrode placement. Electrical activity was reviewed with band pass filter of 1-70Hz , sensitivity of 7 uV/mm, display speed of 61mm/sec with a 60Hz  notched filter applied as appropriate. EEG data were recorded continuously and digitally stored.  Video monitoring was available and reviewed as appropriate.   Description: During awake state, no clear posterior rhythm was seen.  EEG showed continuous generalized 3-7 Doxytab and delta slowing.  During sleep, EEG showed continuous generalized rhythmic low amplitude 2-3Hz  delta slowing admixed with sleep spindles (12 to 14 Hz), maximal frontocentral region. Hyperventilation and photic stimulation were not performed.      ABNORMALITY - Continuous slow, generalized   IMPRESSION: This study was suggestive of moderate diffuse encephalopathy. No seizures or epileptiform discharges were seen throughout the recording.   [name] [name]

## 2023-02-26 NOTE — Progress Notes (Signed)
PT Cancellation Note  Patient Details Name: Colleen Pearson MRN: [id number] DOB: Jul 23, 1990   Cancelled Treatment:    Reason Eval/Treat Not Completed: Patient not medically ready (Pt not medically ready at this time. remains in 4 point restraints. Will follow up at later date/time as schedule allows and pt medically ready.)   Wynn Maudlin, DPT Acute Rehabilitation Services Office [phone]  02/26/23 2:56 PM

## 2023-02-26 NOTE — Progress Notes (Deleted)
Rn was getting "help" from units AD and RN4 when foley was pulled.  Patient is without foley and documentation will reflect so.

## 2023-02-26 NOTE — Progress Notes (Signed)
LTM maint complete - no skin breakdown under: Fp1 Fp2 [facility] monitored, Event button test confirmed by [facility].  

## 2023-02-27 ENCOUNTER — Inpatient Hospital Stay (HOSPITAL_COMMUNITY): Payer: Medicaid Other

## 2023-02-27 DIAGNOSIS — G40901 Epilepsy, unspecified, not intractable, with status epilepticus: Secondary | ICD-10-CM | POA: Diagnosis not present

## 2023-02-27 LAB — CBC
HCT: 28.8 % — ABNORMAL LOW (ref 36.0–46.0)
Hemoglobin: 9.5 g/dL — ABNORMAL LOW (ref 12.0–15.0)
MCH: 29.7 pg (ref 26.0–34.0)
MCHC: 33 g/dL (ref 30.0–36.0)
MCV: 90 fL (ref 80.0–100.0)
Platelets: 252 10*3/uL (ref 150–400)
RBC: 3.2 MIL/uL — ABNORMAL LOW (ref 3.87–5.11)
RDW: 13.7 % (ref 11.5–15.5)
WBC: 10.6 10*3/uL — ABNORMAL HIGH (ref 4.0–10.5)
nRBC: 0 % (ref 0.0–0.2)

## 2023-02-27 LAB — BASIC METABOLIC PANEL
Anion gap: 7 (ref 5–15)
BUN: 28 mg/dL — ABNORMAL HIGH (ref 6–20)
CO2: 25 mmol/L (ref 22–32)
Calcium: 7.9 mg/dL — ABNORMAL LOW (ref 8.9–10.3)
Chloride: 107 mmol/L (ref 98–111)
Creatinine, Ser: 1.01 mg/dL — ABNORMAL HIGH (ref 0.44–1.00)
GFR, Estimated: >60 mL/min (ref >60)
Glucose, Bld: 113 mg/dL — ABNORMAL HIGH (ref 70–99)
Potassium: 3.4 mmol/L — ABNORMAL LOW (ref 3.5–5.1)
Sodium: 139 mmol/L (ref 135–145)

## 2023-02-27 LAB — CK: Total CK: 31091 U/L — ABNORMAL HIGH (ref 38–234)

## 2023-02-27 LAB — GLUCOSE, CAPILLARY
Glucose-Capillary: 102 mg/dL — ABNORMAL HIGH (ref 70–99)
Glucose-Capillary: 112 mg/dL — ABNORMAL HIGH (ref 70–99)
Glucose-Capillary: 117 mg/dL — ABNORMAL HIGH (ref 70–99)
Glucose-Capillary: 119 mg/dL — ABNORMAL HIGH (ref 70–99)
Glucose-Capillary: 122 mg/dL — ABNORMAL HIGH (ref 70–99)
Glucose-Capillary: 153 mg/dL — ABNORMAL HIGH (ref 70–99)
Glucose-Capillary: 99 mg/dL (ref 70–99)

## 2023-02-27 LAB — CULTURE, BLOOD (ROUTINE X 2)
Culture: NO GROWTH
Culture: NO GROWTH
Special Requests: ADEQUATE
Special Requests: ADEQUATE

## 2023-02-27 MED ORDER — IPRATROPIUM-ALBUTEROL 0.5-2.5 (3) MG/3ML IN SOLN
3.0000 mL | Freq: Three times a day (TID) | RESPIRATORY_TRACT | Status: DC
  Administered 2023-02-28: 3 mL via RESPIRATORY_TRACT
  Filled 2023-02-27: qty 3

## 2023-02-27 MED ORDER — LORAZEPAM 2 MG/ML IJ SOLN
2.0000 mg | INTRAMUSCULAR | Status: DC | PRN
  Administered 2023-02-28 (×2): 2 mg via INTRAVENOUS
  Filled 2023-02-27 (×2): qty 1

## 2023-02-27 MED ORDER — POTASSIUM CHLORIDE 20 MEQ PO PACK
40.0000 meq | PACK | Freq: Once | ORAL | Status: AC
  Administered 2023-02-27: 40 meq
  Filled 2023-02-27: qty 2

## 2023-02-27 MED ORDER — LACTATED RINGERS IV SOLN: INTRAVENOUS | Status: DC

## 2023-02-27 MED ORDER — QUETIAPINE FUMARATE 25 MG PO TABS
50.0000 mg | ORAL_TABLET | Freq: Two times a day (BID) | ORAL | Status: DC
  Administered 2023-02-27 (×2): 50 mg
  Filled 2023-02-27 (×2): qty 2

## 2023-02-27 NOTE — Progress Notes (Signed)
PT Cancellation Note  Patient Details Name: PARISSA ALONSO MRN: [id number] DOB: [birth year]   Cancelled Treatment:    Reason Eval/Treat Not Completed: Other (comment) Spoke with RN who reports trying to wean precedex but pt tends to be sedated or agitated and pulling off non-rebreather then sats drop to 70's.  Will continue to hold PT today. Anise Salvo, PT Acute Rehab [facility] [phone]   Rayetta Humphrey 02/27/2023, 10:50 AM

## 2023-02-27 NOTE — Progress Notes (Signed)
eLink Physician-Brief Progress Note Patient Name: Colleen Pearson DOB: August 26, 1990 MRN: [id number]   Date of Service  02/27/2023  HPI/Events of Note  Restless and agitated at times. Pulling O2 source off at times leading to desaturation.    eICU Interventions  Prn ativan dose increased to 2 mg IV every 4 hrs prn     Intervention Category Major Interventions: Delirium, psychosis, severe agitation - evaluation and management  Henry Russel, P 02/27/2023, 11:56 PM

## 2023-02-27 NOTE — Progress Notes (Signed)
NAME:  Colleen Pearson, MRN:  [id number], DOB:  [birth year], LOS: 5 ADMISSION DATE:  02/22/2023, CONSULTATION DATE:  9/2 REFERRING MD:  Blinda Leatherwood, CHIEF COMPLAINT:  statis epilepticus    History of Present Illness:   This is a 32 year old female patient with a history of epilepsy, for which she takes Keppra 1000 mg twice daily, last breakthrough seizure noted in June 2024.  Presents to the hospital via EMS after family contacted them for prolonged seizure.  Reportedly seizure lasted for approximately 45 minutes. She was still in status epilepticus on EMS arrival requiring multiple doses of midazolam prehospital.  On initial evaluation by EDP patient was unresponsive, requiring assisted ventilation via mask.  She was intubated loaded with IV Keppra seen by neurology, CT head was negative, additionally received a total of 4 L of crystalloid for intermittent hypotension UDS positive for THC.   Additional ER evaluation demonstrated the following: White blood cell count 18.1, sodium 131, bicarbonate less than 7, glucose 274, creatinine 1.74, lipase 46 beta hydroxybutyric acid was negative at 0.12 initial total CKs 252 urine pregnancy negative initial venous blood gas showed pH of 6.84 pCO2 of 37 and HCO3 of 6.3 her lactate was > 15.   Critical care asked to admit Pertinent  Medical History  Seizure disorder, followed at [facility] Events: Including procedures, antibiotic start and stop dates in addition to other pertinent events   32 year old female patient admitted in status epilepticus intubated in the emergency room, received 4 L of crystalloid for intermittent hypotension.  Loaded with Keppra, , started on ceftriaxone for possible aspiration 9/2 ceribell EEG suggesting focal seizure episode at 03 26 Extubated on 9/3 Re intubated 9/4. COVID positive.  Extubated 9/6  Interim History / Subjective:  Extubated yesterday On NRB this am with SpO2 100%. Intermittently agitated and  requiring precedex Diuresed yesterday Objective   Blood pressure 110/75, pulse 83, temperature 98.8 F (37.1 C), temperature source Axillary, resp. rate 18, height 5\' 1"  (1.549 m), weight 66.9 kg, SpO2 100%.    FiO2 (%):  [100 %] 100 %   Intake/Output Summary (Last 24 hours) at 02/27/2023 0926 Last data filed at 02/27/2023 0800 Gross per 24 hour  Intake 4868.6 ml  Output 2900 ml  Net 1968.6 ml   Filed Weights   02/24/23 0702 02/25/23 0343 02/26/23 0704  Weight: 59.3 kg 62.4 kg 66.9 kg    Physical Exam: General: Disheveled-appearing, no acute distress HENT: [place], AT, OP clear, MMM Eyes: EOMI, no scleral icterus Respiratory: Diminished to auscultation bilaterally.  No crackles, wheezing or rales Cardiovascular: RRR, -M/R/G, no JVD GI: BS+, soft, nontender Extremities:-Edema,-tenderness Neuro: Drowsy on precedex, moves extremities x 4  02/26/23 WBC 12.8 Cr improved to 1.11  02/27/23 Stable Hg 9.5  Resolved Hospital Problem list     Assessment & Plan:  Status epilepticus -AEDs and cEEG per neuro -Wean as able  Postictal delirium, polysubstance abuse hx and suspicion for current - Encourage day/night cycles - Encourage family visitation - Wean precedex as able - Start seroquel BID. Haldol PRN. QTC monitoring  Acute respiratory failure with hypoxia and hypercapnia Severe sepsis with septic shock due to aspiration pneumonia with ARDS, not POA COVID pneumonia MRSA pneumonia - Agitation/anxiety worsens respiratory status. Unable to tolerate BiPAP - Wean off NRB when able and start HFNC for goal SpO2 >88% - High risk for re-intubation - Scheduled duonebs - Vanc 9/5>   Severe anion gap/lactic acidosis and following prolonged refractory seizure , improving. -  Resolved AG AKI after prolonged seizure - improving Severe acute rhabdomyolysis - AM labs not collected. f/u am labs. Hold on IVF for now  Best Practice (right click and "Reselect all SmartList Selections" daily)    Diet/type: Tube feeds DVT prophylaxis: prophylactic heparin  GI prophylaxis: H2B Lines: N/A Foley:  Yes, and it is still needed Code Status:  full code Last date of multidisciplinary goals of care discussion [9/5: father updated on phone]  The patient is critically ill with respiratory failure and requires high complexity decision making for assessment and support, frequent evaluation and titration of therapies, application of advanced monitoring technologies and extensive interpretation of multiple databases.  Independent Critical Care Time: 30 Minutes.   Mechele Collin, M.D. [facility] Pulmonary/Critical Care Medicine 02/27/2023 9:26 AM   Please see Amion for pager number to reach on-call Pulmonary and Critical Care Team.

## 2023-02-27 NOTE — Progress Notes (Signed)
Pharmacy Electrolyte Replacement  Recent Labs:  Recent Labs    02/24/23 1953 02/25/23 0409 02/27/23 1211  K  --    < > 3.4*  MG 2.2  --   --   PHOS 2.7  --   --   CREATININE  --    < > 1.01*   < > = values in this interval not displayed.    Low Critical Values (K </= 2.5, Phos </= 1, Mg </= 1) Present: None   Plan: - Kcl 40 mEq per tube x1  Rexford Maus, PharmD, BCPS 02/27/2023 2:28 PM

## 2023-02-27 NOTE — Plan of Care (Signed)
  Problem: Clinical Measurements: Goal: Diagnostic test results will improve Outcome: Progressing Goal: Cardiovascular complication will be avoided Outcome: Progressing   Problem: Nutrition: Goal: Adequate nutrition will be maintained Outcome: Progressing   Problem: Elimination: Goal: Will not experience complications related to bowel motility Outcome: Progressing Goal: Will not experience complications related to urinary retention Outcome: Progressing   Problem: Safety: Goal: Ability to remain free from injury will improve Outcome: Progressing   Problem: Skin Integrity: Goal: Risk for impaired skin integrity will decrease Outcome: Progressing   Problem: Activity: Goal: Ability to tolerate increased activity will improve Outcome: Progressing   Problem: Respiratory: Goal: Ability to maintain a clear airway and adequate ventilation will improve Outcome: Progressing   Problem: Role Relationship: Goal: Method of communication will improve Outcome: Progressing   Problem: Education: Goal: Knowledge of General Education information will improve Description: Including pain rating scale, medication(s)/side effects and non-pharmacologic comfort measures Outcome: Not Progressing   Problem: Health Behavior/Discharge Planning: Goal: Ability to manage health-related needs will improve Outcome: Not Progressing   Problem: Clinical Measurements: Goal: Ability to maintain clinical measurements within normal limits will improve Outcome: Not Progressing Goal: Will remain free from infection Outcome: Not Progressing Goal: Respiratory complications will improve Outcome: Not Progressing   Problem: Activity: Goal: Risk for activity intolerance will decrease Outcome: Not Progressing   Problem: Coping: Goal: Level of anxiety will decrease Outcome: Not Progressing   Problem: Pain Managment: Goal: General experience of comfort will improve Outcome: Not Progressing   Problem:  Safety: Goal: Non-violent Restraint(s) Outcome: Not Progressing

## 2023-02-28 DIAGNOSIS — G40901 Epilepsy, unspecified, not intractable, with status epilepticus: Secondary | ICD-10-CM | POA: Diagnosis not present

## 2023-02-28 LAB — GLUCOSE, CAPILLARY
Glucose-Capillary: 126 mg/dL — ABNORMAL HIGH (ref 70–99)
Glucose-Capillary: 135 mg/dL — ABNORMAL HIGH (ref 70–99)
Glucose-Capillary: 119 mg/dL — ABNORMAL HIGH (ref 70–99)
Glucose-Capillary: 143 mg/dL — ABNORMAL HIGH (ref 70–99)
Glucose-Capillary: 101 mg/dL — ABNORMAL HIGH (ref 70–99)
Glucose-Capillary: 85 mg/dL (ref 70–99)

## 2023-02-28 LAB — CBC
HCT: 29 % — ABNORMAL LOW (ref 36.0–46.0)
Hemoglobin: 9.6 g/dL — ABNORMAL LOW (ref 12.0–15.0)
MCH: 28.8 pg (ref 26.0–34.0)
MCHC: 33.1 g/dL (ref 30.0–36.0)
MCV: 87.1 fL (ref 80.0–100.0)
Platelets: 290 10*3/uL (ref 150–400)
RBC: 3.33 MIL/uL — ABNORMAL LOW (ref 3.87–5.11)
RDW: 13.5 % (ref 11.5–15.5)
WBC: 10.7 10*3/uL — ABNORMAL HIGH (ref 4.0–10.5)
nRBC: 0 % (ref 0.0–0.2)

## 2023-02-28 LAB — BASIC METABOLIC PANEL
Anion gap: 12 (ref 5–15)
BUN: 21 mg/dL — ABNORMAL HIGH (ref 6–20)
CO2: 21 mmol/L — ABNORMAL LOW (ref 22–32)
Calcium: 8 mg/dL — ABNORMAL LOW (ref 8.9–10.3)
Chloride: 106 mmol/L (ref 98–111)
Creatinine, Ser: 0.87 mg/dL (ref 0.44–1.00)
GFR, Estimated: >60 mL/min (ref >60)
Glucose, Bld: 137 mg/dL — ABNORMAL HIGH (ref 70–99)
Potassium: 3.9 mmol/L (ref 3.5–5.1)
Sodium: 139 mmol/L (ref 135–145)

## 2023-02-28 LAB — CULTURE, RESPIRATORY W GRAM STAIN

## 2023-02-28 LAB — CK: Total CK: 15098 U/L — ABNORMAL HIGH (ref 38–234)

## 2023-02-28 MED ORDER — QUETIAPINE FUMARATE 50 MG PO TABS
50.0000 mg | ORAL_TABLET | Freq: Every day | ORAL | Status: DC
  Administered 2023-02-28 – 2023-03-03 (×4): 50 mg
  Filled 2023-02-28 (×2): qty 1
  Filled 2023-02-28: qty 2
  Filled 2023-02-28: qty 1

## 2023-02-28 MED ORDER — GERHARDT'S BUTT CREAM
TOPICAL_CREAM | Freq: Every day | CUTANEOUS | Status: DC
  Filled 2023-02-28: qty 1

## 2023-02-28 MED ORDER — QUETIAPINE FUMARATE 100 MG PO TABS
100.0000 mg | ORAL_TABLET | Freq: Every day | ORAL | Status: DC
  Administered 2023-02-28 – 2023-03-02 (×3): 100 mg
  Filled 2023-02-28 (×3): qty 1

## 2023-02-28 MED ORDER — IPRATROPIUM-ALBUTEROL 0.5-2.5 (3) MG/3ML IN SOLN
3.0000 mL | Freq: Two times a day (BID) | RESPIRATORY_TRACT | Status: DC
  Administered 2023-02-28: 3 mL via RESPIRATORY_TRACT
  Filled 2023-02-28: qty 3

## 2023-02-28 NOTE — Progress Notes (Signed)
PCCM Progress Note  Significantly improved mental status. Weaned off Precedex. Will transfer to [facility] and floor Continue IVF for rhabdo Monitor mental status

## 2023-02-28 NOTE — Progress Notes (Signed)
Inpatient Rehab Admissions Coordinator Note:   Per PT patient was screened for CIR candidacy by Deniyah Dillavou Luvenia Starch, CCC-SLP. At this time, pt appears to be a potential candidate for CIR. If pt would like to be considered, please place an IP Rehab MD consult order.    Colleen Phoenix, MS, CCC-SLP Admissions Coordinator [phone] 02/28/23 4:25 PM

## 2023-02-28 NOTE — Evaluation (Signed)
Physical Therapy Evaluation Patient Details Name: Colleen Pearson MRN: [id number] DOB: [birth year] Today's Date: 02/28/2023  History of Present Illness  32 yo female admitted 9/2 with prolonged seizure requiring intubation and Keppra. Intubated (9/2-9/3, 9/4-9/6). 9/4 Covid (+). PMHx: epilepsy  Clinical Impression  Pt lethargic on arrival but able to arouse to name, pt keeps eyes partially open and with mumbled speech difficult to understand. Pt reporting she has been kidnapped and wants to go home, pt also hallucinating that roaches are on the wall. Pt states she is independent and lives with daughter and sister but no family present to confirm. Pt requires +2 min-mod assist due to AMS and impulsivity. Pt with decreased cognition, balance, strength, function and gait who will benefit from acute therapy to maximize mobility, independence and function to decrease burden of care.   SPO2 87% on RA as pt had removed it, returned to 4L with SPO2 95%        If plan is discharge home, recommend the following: A lot of help with walking and/or transfers;A lot of help with bathing/dressing/bathroom;Assistance with cooking/housework;Direct supervision/assist for medications management;Assist for transportation;Supervision due to cognitive status;Direct supervision/assist for financial management;Help with stairs or ramp for entrance   Can travel by private vehicle        Equipment Recommendations Rolling walker (2 wheels);BSC/3in1  Recommendations for Other Services  Speech consult;Rehab consult;OT consult    Functional Status Assessment Patient has had a recent decline in their functional status and demonstrates the ability to make significant improvements in function in a reasonable and predictable amount of time.     Precautions / Restrictions Precautions Precautions: Fall;Other (comment) Precaution Comments: watch sats      Mobility  Bed Mobility Overal bed mobility: Needs  Assistance Bed Mobility: Supine to Sit     Supine to sit: Min assist, HOB elevated     General bed mobility comments: HOb 20 degrees with min assist to lift trunk from surface and pivot to EOB, mod multimodal cues, assist for lines and safety    Transfers Overall transfer level: Needs assistance   Transfers: Sit to/from Stand, Bed to chair/wheelchair/BSC Sit to Stand: Min assist, +2 safety/equipment Stand pivot transfers: Mod assist         General transfer comment: min assist to rise from surface with cues for hand placement, pt impulsive with no awareness of safety requiring max multimodal cues for balance standing from bed, recliner and toilet. Mod assist to control pelvis to pivot bed to recliner    Ambulation/Gait Ambulation/Gait assistance: Mod assist, +2 safety/equipment Gait Distance (Feet): 10 Feet Assistive device: Rolling walker (2 wheels) Gait Pattern/deviations: Decreased stride length, Wide base of support, Trunk flexed, Drifts right/left   Gait velocity interpretation: <1.8 ft/sec, indicate of risk for recurrent falls   General Gait Details: pt impulsive lifting hands off RW, pushing RW to side and needing mod +2 assist for safety with IV, O2 and RW to walk to bathroom and back, max assist for [employer]            Wheelchair Mobility     Tilt Bed    Modified Rankin (Stroke Patients Only)       Balance Overall balance assessment: Needs assistance Sitting-balance support: No upper extremity supported, Feet supported Sitting balance-Leahy Scale: Fair Sitting balance - Comments: static at toilet and bed   Standing balance support: Single extremity supported, Bilateral upper extremity supported Standing balance-Leahy Scale: Poor Standing balance comment: min assist for standing balance  even with UB support                             Pertinent Vitals/Pain Pain Assessment Pain Assessment: No/denies pain    Home Living  Family/patient expects to be discharged to:: Private residence Living Arrangements: Children;Other relatives Available Help at Discharge: Family;Available PRN/intermittently Type of Home: House Home Access: Stairs to enter   Entrance Stairs-Number of Steps: 5   Home Layout: Multi-level;Able to live on main level with bedroom/bathroom Home Equipment: None Additional Comments: pt confused and will need to confirm home setup and PLOF. Pt reports 3 story home, with pt sleeping in living room. Pt states she lives with her sister and 5yo daughter    Prior Function Prior Level of Function : Independent/Modified Independent                     Extremity/Trunk Assessment   Upper Extremity Assessment Upper Extremity Assessment: Generalized weakness    Lower Extremity Assessment Lower Extremity Assessment: Generalized weakness    Cervical / Trunk Assessment Cervical / Trunk Assessment: Normal  Communication   Communication Communication: No apparent difficulties  Cognition Arousal: Lethargic Behavior During Therapy: Agitated, Impulsive Overall Cognitive Status: Impaired/Different from baseline Area of Impairment: Orientation, Attention, Memory, Following commands, Safety/judgement, Problem solving, Awareness                 Orientation Level: Disoriented to, Place, Time, Situation Current Attention Level: Focused Memory: Decreased short-term memory Following Commands: Follows one step commands inconsistently, Follows one step commands with increased time Safety/Judgement: Decreased awareness of safety, Decreased awareness of deficits   Problem Solving: Slow processing, Requires verbal cues, Requires tactile cues General Comments: pt repeatedly stating she was kidnapped yet stating she wants to sign herself out and go home. Oriented to self only, impulsive and no awareness of deficits        General Comments      Exercises     Assessment/Plan    PT Assessment  Patient needs continued PT services  PT Problem List Decreased strength;Decreased mobility;Decreased safety awareness;Decreased activity tolerance;Decreased cognition;Cardiopulmonary status limiting activity;Decreased balance;Decreased knowledge of use of DME       PT Treatment Interventions DME instruction;Therapeutic exercise;Gait training;Balance training;Neuromuscular re-education;Stair training;Functional mobility training;Cognitive remediation;Therapeutic activities;Patient/family education    PT Goals (Current goals can be found in the Care Plan section)  Acute Rehab PT Goals PT Goal Formulation: Patient unable to participate in goal setting Time For Goal Achievement: 03/14/23 Potential to Achieve Goals: Fair    Frequency Min 1X/week     Co-evaluation               AM-PAC PT "6 Clicks" Mobility  Outcome Measure Help needed turning from your back to your side while in a flat bed without using bedrails?: A Little Help needed moving from lying on your back to sitting on the side of a flat bed without using bedrails?: A Little Help needed moving to and from a bed to a chair (including a wheelchair)?: A Lot Help needed standing up from a chair using your arms (e.g., wheelchair or bedside chair)?: A Lot Help needed to walk in hospital room?: Total Help needed climbing 3-5 steps with a railing? : Total 6 Click Score: 12    End of Session Equipment Utilized During Treatment: Gait belt;Oxygen Activity Tolerance: Patient tolerated treatment well Patient left: in chair;with call bell/phone within reach;with nursing/sitter in room;with chair alarm  set;with restraints reapplied Nurse Communication: Mobility status PT Visit Diagnosis: Other abnormalities of gait and mobility (R26.89);Muscle weakness (generalized) (M62.81);Unsteadiness on feet (R26.81);Other symptoms and signs involving the nervous system (R29.898)    Time: 1610-9604 PT Time Calculation (min) (ACUTE ONLY): 38  min   Charges:   PT Evaluation $PT Eval Moderate Complexity: 1 Mod PT Treatments $Gait Training: 8-22 mins $Therapeutic Activity: 8-22 mins PT General Charges $$ ACUTE PT VISIT: 1 Visit         Merryl Hacker, PT Acute Rehabilitation Services Office: [phone]   Enedina Finner Jerni Selmer 02/28/2023, 12:45 PM

## 2023-02-28 NOTE — Progress Notes (Signed)
NAME:  Colleen Pearson, MRN:  [id number], DOB:  10-Mar-1991, LOS: 6 ADMISSION DATE:  02/22/2023, CONSULTATION DATE:  9/2 REFERRING MD:  Blinda Leatherwood, CHIEF COMPLAINT:  statis epilepticus    History of Present Illness:   This is a 32 year old female patient with a history of epilepsy, for which she takes Keppra 1000 mg twice daily, last breakthrough seizure noted in June 2024.  Presents to the hospital via EMS after family contacted them for prolonged seizure.  Reportedly seizure lasted for approximately 45 minutes. She was still in status epilepticus on EMS arrival requiring multiple doses of midazolam prehospital.  On initial evaluation by EDP patient was unresponsive, requiring assisted ventilation via mask.  She was intubated loaded with IV Keppra seen by neurology, CT head was negative, additionally received a total of 4 L of crystalloid for intermittent hypotension UDS positive for THC.   Additional ER evaluation demonstrated the following: White blood cell count 18.1, sodium 131, bicarbonate less than 7, glucose 274, creatinine 1.74, lipase 46 beta hydroxybutyric acid was negative at 0.12 initial total CKs 252 urine pregnancy negative initial venous blood gas showed pH of 6.84 pCO2 of 37 and HCO3 of 6.3 her lactate was > 15.   Critical care asked to admit Pertinent  Medical History  Seizure disorder, followed at [facility] Events: Including procedures, antibiotic start and stop dates in addition to other pertinent events   32 year old female patient admitted in status epilepticus intubated in the emergency room, received 4 L of crystalloid for intermittent hypotension.  Loaded with Keppra, , started on ceftriaxone for possible aspiration 9/2 ceribell EEG suggesting focal seizure episode at 03 26 Extubated on 9/3 Re intubated 9/4. COVID positive.  Extubated 9/6  Interim History / Subjective:  Remains agitated overnight. Required ativan PRN due to pulling oxygen off  This  morning weaned to 5L HFNC Objective   Blood pressure (!) 98/54, pulse 86, temperature 98.6 F (37 C), temperature source Axillary, resp. rate (!) 24, height 5\' 1"  (1.549 m), weight 66.9 kg, SpO2 98%.    FiO2 (%):  [100 %] 100 %   Intake/Output Summary (Last 24 hours) at 02/28/2023 0919 Last data filed at 02/28/2023 0800 Gross per 24 hour  Intake 3821.87 ml  Output 2100 ml  Net 1721.87 ml   Filed Weights   02/24/23 0702 02/25/23 0343 02/26/23 0704  Weight: 59.3 kg 62.4 kg 66.9 kg   Physical Exam: General: Disheveled-appearing, no acute distress HENT: [place], AT, OP clear, MMM Eyes: EOMI, no scleral icterus Respiratory: Diminished to auscultation bilaterally.  No crackles, wheezing or rales Cardiovascular: RRR, -M/R/G, no JVD GI: BS+, soft, nontender Extremities:-Edema,-tenderness Neuro: Opens eyes to touch, drowsy  02/27/23 WBC 10.6 improving Hg 9.5 stable CK 31K  Resolved Hospital Problem list     Assessment & Plan:  Status epilepticus -AEDs per neuro. Neuro following -cEEG d/c'd 9/6  Postictal delirium, polysubstance abuse hx and suspicion for current - Encourage day/night cycles - Encourage family visitation. Family coming today - Wean Precedex as able - Seroquel 50 in am and increased to 100 mg nightly. Haldol PRN. QTC monitoring  Acute respiratory failure with hypoxia and hypercapnia Severe sepsis with septic shock due to aspiration pneumonia with ARDS, not POA COVID pneumonia MRSA pneumonia - Agitation/anxiety worsens respiratory status. Unable to tolerate BiPAP - Continue to wean HFNC for goal SpO2 >88% - High risk for re-intubation during episodes of agitation and hypoxemia - Scheduled duonebs - Vanc 9/5>   Severe anion  gap/lactic acidosis and following prolonged refractory seizure , improving. - Resolved AG AKI after prolonged seizure - improving Severe acute rhabdomyolysis - increasing CK - AM labs not collected - Continue IVF  Best Practice (right click  and "Reselect all SmartList Selections" daily)   Diet/type: Tube feeds DVT prophylaxis: prophylactic heparin  GI prophylaxis: H2B Lines: N/A Foley:  Yes, and it is still needed Code Status:  full code Last date of multidisciplinary goals of care discussion [9/5: father updated on phone]  The patient is critically ill with multiple organ systems failure and requires high complexity decision making for assessment and support, frequent evaluation and titration of therapies, application of advanced monitoring technologies and extensive interpretation of multiple databases.  Independent Critical Care Time: 30 Minutes.   Mechele Collin, M.D. [facility] Pulmonary/Critical Care Medicine 02/28/2023 9:28 AM   Please see Amion for pager number to reach on-call Pulmonary and Critical Care Team.

## 2023-02-28 NOTE — Plan of Care (Signed)
  Problem: Clinical Measurements: Goal: Ability to maintain clinical measurements within normal limits will improve Outcome: Progressing Goal: Will remain free from infection Outcome: Progressing Goal: Diagnostic test results will improve Outcome: Progressing Goal: Respiratory complications will improve Outcome: Progressing Goal: Cardiovascular complication will be avoided Outcome: Progressing   Problem: Activity: Goal: Risk for activity intolerance will decrease Outcome: Progressing   Problem: Nutrition: Goal: Adequate nutrition will be maintained Outcome: Progressing   Problem: Elimination: Goal: Will not experience complications related to bowel motility Outcome: Progressing Goal: Will not experience complications related to urinary retention Outcome: Progressing   Problem: Pain Managment: Goal: General experience of comfort will improve Outcome: Progressing   Problem: Safety: Goal: Ability to remain free from injury will improve Outcome: Progressing   Problem: Skin Integrity: Goal: Risk for impaired skin integrity will decrease Outcome: Progressing   Problem: Safety: Goal: Non-violent Restraint(s) Outcome: Progressing   Problem: Activity: Goal: Ability to tolerate increased activity will improve Outcome: Progressing   Problem: Respiratory: Goal: Ability to maintain a clear airway and adequate ventilation will improve Outcome: Progressing   Problem: Role Relationship: Goal: Method of communication will improve Outcome: Progressing   Problem: Education: Goal: Knowledge of General Education information will improve Description: Including pain rating scale, medication(s)/side effects and non-pharmacologic comfort measures Outcome: Not Progressing   Problem: Health Behavior/Discharge Planning: Goal: Ability to manage health-related needs will improve Outcome: Not Progressing   Problem: Coping: Goal: Level of anxiety will decrease Outcome: Not  Progressing

## 2023-03-01 ENCOUNTER — Inpatient Hospital Stay (HOSPITAL_COMMUNITY): Payer: Medicaid Other

## 2023-03-01 DIAGNOSIS — M6282 Rhabdomyolysis: Secondary | ICD-10-CM | POA: Insufficient documentation

## 2023-03-01 DIAGNOSIS — U071 COVID-19: Secondary | ICD-10-CM | POA: Insufficient documentation

## 2023-03-01 LAB — VANCOMYCIN, RANDOM: Vancomycin Rm: 5 ug/mL

## 2023-03-01 LAB — GLUCOSE, CAPILLARY
Glucose-Capillary: 131 mg/dL — ABNORMAL HIGH (ref 70–99)
Glucose-Capillary: 102 mg/dL — ABNORMAL HIGH (ref 70–99)
Glucose-Capillary: 92 mg/dL (ref 70–99)
Glucose-Capillary: 142 mg/dL — ABNORMAL HIGH (ref 70–99)
Glucose-Capillary: 88 mg/dL (ref 70–99)
Glucose-Capillary: 107 mg/dL — ABNORMAL HIGH (ref 70–99)
Glucose-Capillary: 147 mg/dL — ABNORMAL HIGH (ref 70–99)

## 2023-03-01 LAB — CBC
HCT: 32.4 % — ABNORMAL LOW (ref 36.0–46.0)
Hemoglobin: 10.8 g/dL — ABNORMAL LOW (ref 12.0–15.0)
MCH: 29.1 pg (ref 26.0–34.0)
MCHC: 33.3 g/dL (ref 30.0–36.0)
MCV: 87.3 fL (ref 80.0–100.0)
Platelets: 407 10*3/uL — ABNORMAL HIGH (ref 150–400)
RBC: 3.71 MIL/uL — ABNORMAL LOW (ref 3.87–5.11)
RDW: 13.6 % (ref 11.5–15.5)
WBC: 13.2 10*3/uL — ABNORMAL HIGH (ref 4.0–10.5)
nRBC: 0 % (ref 0.0–0.2)

## 2023-03-01 LAB — TROPONIN I (HIGH SENSITIVITY)
Troponin I (High Sensitivity): 628 ng/L (ref <18)
Troponin I (High Sensitivity): 485 ng/L (ref <18)

## 2023-03-01 LAB — BASIC METABOLIC PANEL
Anion gap: 8 (ref 5–15)
BUN: 18 mg/dL (ref 6–20)
CO2: 23 mmol/L (ref 22–32)
Calcium: 8.3 mg/dL — ABNORMAL LOW (ref 8.9–10.3)
Chloride: 106 mmol/L (ref 98–111)
Creatinine, Ser: 0.92 mg/dL (ref 0.44–1.00)
GFR, Estimated: >60 mL/min (ref >60)
Glucose, Bld: 62 mg/dL — ABNORMAL LOW (ref 70–99)
Potassium: 3.9 mmol/L (ref 3.5–5.1)
Sodium: 137 mmol/L (ref 135–145)

## 2023-03-01 LAB — CK: Total CK: 12220 U/L — ABNORMAL HIGH (ref 38–234)

## 2023-03-01 LAB — VANCOMYCIN, PEAK: Vancomycin Pk: 5 ug/mL — ABNORMAL LOW (ref 30–40)

## 2023-03-01 MED ORDER — IPRATROPIUM-ALBUTEROL 0.5-2.5 (3) MG/3ML IN SOLN: 3.0000 mL | Freq: Four times a day (QID) | RESPIRATORY_TRACT | Status: DC | PRN

## 2023-03-01 MED ORDER — LINEZOLID 600 MG PO TABS
600.0000 mg | ORAL_TABLET | Freq: Two times a day (BID) | ORAL | Status: DC
  Administered 2023-03-01 – 2023-03-03 (×4): 600 mg via ORAL
  Filled 2023-03-01 (×8): qty 1

## 2023-03-01 MED ORDER — ENOXAPARIN SODIUM 40 MG/0.4ML IJ SOSY
40.0000 mg | PREFILLED_SYRINGE | INTRAMUSCULAR | Status: DC
  Administered 2023-03-02 – 2023-03-03 (×2): 40 mg via SUBCUTANEOUS
  Filled 2023-03-01 (×2): qty 0.4

## 2023-03-01 MED ORDER — HEPARIN SODIUM (PORCINE) 5000 UNIT/ML IJ SOLN
5000.0000 [IU] | Freq: Three times a day (TID) | INTRAMUSCULAR | Status: AC
  Administered 2023-03-01 (×2): 5000 [IU] via SUBCUTANEOUS
  Filled 2023-03-01 (×2): qty 1

## 2023-03-01 NOTE — Evaluation (Signed)
Clinical/Bedside Swallow Evaluation Patient Details  Name: Colleen Pearson MRN: [id number] Date of Birth: [birth year]  Today's Date: 03/01/2023 Time: SLP Start Time (ACUTE ONLY): 1105 SLP Stop Time (ACUTE ONLY): 1126 SLP Time Calculation (min) (ACUTE ONLY): 21 min  Past Medical History:  Past Medical History:  Diagnosis Date   Asthma    Cocaine use complicating pregnancy in third trimester 08/30/2017   Endometriosis    Ovarian cyst    Seizures (HCC)    Status post cesarean section 08/30/2017   Past Surgical History:  Past Surgical History:  Procedure Laterality Date   CESAREAN SECTION N/A 08/30/2017   Procedure: CESAREAN SECTION;  Surgeon: Conard Novak, MD;  Location: [facility] ORS;  Service: Obstetrics;  Laterality: N/A;   LASER ABLATION/CAUTERIZATION OF ENDOMETRIAL IMPLANTS     HPI:  Colleen Pearson is a 32 yo female presenting to ED 9/2 from home after a prolonged seizure (lasted at least 45 minutes continuously). Intubated upon EMS arrival 9/2-9/3, reintubated 9/4-9/6. Cortrak placed 9/4.  CTH 9/2 unremarkable. CXR 9/3 with new increased hazy opacities in lower lung fields and mid perihilar areas. PMH includes asthma, THC and cocaine use, endometriosis, ovarian cyst, seizures    Assessment / Plan / Recommendation  Clinical Impression  Pt reports being very thirsty. She is oriented to self, place, and situation; although somewhat disoriented to time (asking if her seizure happened two days ago). She reports no acute concerns with swallowing, although reports her vocal quality is different than baseline. Oral motor exam WFL. Pt somewhat impulsive with POs, initially taking large consecutive sips of thin liquids via straw with an immediate bout of coughing and wet vocal quality. Trials of purees overall without signs of dysphagia. Pt declined trials of solids (crackers), stating "they're too dry". SLP provided education regarding post-intubation vocal fold trauma, which may be  representative of pt's hoarse and breathy quality of voice. Feel an instrumental swallow study is warranted at this time to make further recommendations for least restrictive diet. Plan to complete MBS as scheduling allows. Recommend she remain NPO pending results.  SLP Visit Diagnosis: Dysphagia, unspecified (R13.10);Aphonia (R49.1)    Aspiration Risk  Moderate aspiration risk    Diet Recommendation NPO    Medication Administration: Via alternative means    Other  Recommendations Oral Care Recommendations: Oral care QID    Recommendations for follow up therapy are one component of a multi-disciplinary discharge planning process, led by the attending physician.  Recommendations may be updated based on patient status, additional functional criteria and insurance authorization.  Follow up Recommendations Acute inpatient rehab (3hours/day)      Assistance Recommended at Discharge    Functional Status Assessment Patient has had a recent decline in their functional status and demonstrates the ability to make significant improvements in function in a reasonable and predictable amount of time.  Frequency and Duration min 2x/week  2 weeks       Prognosis Prognosis for improved oropharyngeal function: Good      Swallow Study   General HPI: Colleen Pearson is a 32 yo female presenting to ED 9/2 from home after a prolonged seizure (lasted at least 45 minutes continuously). Intubated upon EMS arrival 9/2-9/3, reintubated 9/4-9/6. Cortrak placed 9/4.  CTH 9/2 unremarkable. CXR 9/3 with new increased hazy opacities in lower lung fields and mid perihilar areas. PMH includes asthma, THC and cocaine use, endometriosis, ovarian cyst, seizures Type of Study: Bedside Swallow Evaluation Previous Swallow Assessment: none in chart Diet Prior to  this Study: NPO Temperature Spikes Noted: No Respiratory Status: Room air History of Recent Intubation: Yes Total duration of intubation (days): 3  days Date extubated: 02/26/23 Behavior/Cognition: Alert;Cooperative;Pleasant mood Oral Cavity Assessment: Within Functional Limits Oral Care Completed by SLP: No Oral Cavity - Dentition: Adequate natural dentition Vision: Functional for self-feeding Self-Feeding Abilities: Able to feed self Patient Positioning: Upright in bed Baseline Vocal Quality: Breathy;Low vocal intensity Volitional Cough: Congested Volitional Swallow: Able to elicit    Oral/Motor/Sensory Function Overall Oral Motor/Sensory Function: Within functional limits   Ice Chips Ice chips: Not tested   Thin Liquid Thin Liquid: Impaired Presentation: Straw Pharyngeal  Phase Impairments: Cough - Immediate    Nectar Thick Nectar Thick Liquid: Not tested   Honey Thick Honey Thick Liquid: Not tested   Puree Puree: Within functional limits Presentation: Spoon;Self Fed   Solid     Solid: Not tested      Gwynneth Aliment, M.A., CF-SLP Speech Language Pathology, Acute Rehabilitation Services  Secure Chat preferred [phone]  03/01/2023,11:49 AM

## 2023-03-01 NOTE — Progress Notes (Signed)
Pt back on floor from barium swallow study.   Christain Sacramento, RN

## 2023-03-01 NOTE — Progress Notes (Signed)
Pt off floor for barium swallow study.   Christain Sacramento, RN

## 2023-03-01 NOTE — Progress Notes (Incomplete)
Pharmacy Antibiotic Note  SAMYUKTHA SOKOLSKI is a 32 y.o. female admitted on 02/22/2023 with pneumonia.  Pharmacy has been consulted for vancomycin dosing. Patient with MRSA PNA. Today is day 6 of therapy. Vancomycin peak ordered for 1 hour after infusion given on 9/9 - resulted as < 5.   Plan: {Assessment:21075}  Height: 5\' 1"  (154.9 cm) Weight: 71.5 kg (157 lb 10.1 oz) IBW/kg (Calculated) : 47.8  Temp (24hrs), Avg:98.2 F (36.8 C), Min:98 F (36.7 C), Max:98.5 F (36.9 C)  Recent Labs  Lab 02/25/23 0409 02/26/23 0800 02/27/23 1211 02/28/23 0825 03/01/23 0502 03/01/23 1113  WBC 9.3 12.8* 10.6* 10.7* 13.2*  --   CREATININE 1.34* 1.11* 1.01* 0.87 0.92  --   VANCOPEAK  --   --   --   --   --  5*  VANCORANDOM 6  --   --   --   --   --     Estimated Creatinine Clearance: 79.4 mL/min (by C-G formula based on SCr of 0.92 mg/dL).    Allergies  Allergen Reactions   Tramadol Hives and Swelling   Thank you for allowing pharmacy to be a part of this patient's care.  Estill Batten, PharmD, BCCCP  03/01/2023 3:26 PM

## 2023-03-01 NOTE — Progress Notes (Signed)
   03/01/23 1030  What Happened  Was fall witnessed? Yes  Who witnessed fall? Marquis Buggy, RN  Patients activity before fall bathroom-assisted  Point of contact buttocks  Was patient injured? No  Provider Notification  Provider Name/Title Ashok Pall, MD  Date Provider Notified 03/01/23  Time Provider Notified 1031  Method of Notification Page  Notification Reason Fall  Provider response No new orders  Date of Provider Response 03/01/23  Time of Provider Response 1031  Follow Up  Additional tests No  Simple treatment Other (comment) (none needed)  Progress note created (see row info) Yes  Blank note created Yes  Adult Fall Risk Assessment  Risk Factor Category (scoring not indicated) High fall risk per protocol (document High fall risk)  Adult Fall Risk Interventions  Required Bundle Interventions *See Row Information* High fall risk - low, moderate, and high requirements implemented  Additional Interventions Use of appropriate toileting equipment (bedpan, BSC, etc.)  Fall intervention(s) refused/Patient educated regarding refusal Bed alarm;Nonskid socks;Open door if unsupervised;Yellow bracelet;Supervision while toileting/edge of bed sitting  Screening for Fall Injury Risk (To be completed on HIGH fall risk patients) - Assessing Need for Floor Mats  Risk For Fall Injury- Criteria for Floor Mats Noncompliant with safety precautions  Will Implement Floor Mats Yes

## 2023-03-01 NOTE — Progress Notes (Signed)
While in another patient room, Colleen Pearson called out, the tech and another RN responded to help pt use the BSC. While walking back I was notified by RN that the patient had fallen while getting back to bed from [facility]. RN stated pt fell on her butt, did not hit her head, and was a one assist back to bed. MD notified per witnessing RN.

## 2023-03-01 NOTE — Progress Notes (Signed)
Nutrition Follow-up  DOCUMENTATION CODES:   Not applicable  INTERVENTION:  - Continue Tube feeding via Cortrak tube: Osmolite 1.5 at 55 ml/h (1320 ml per day) Prosource TF20 60 ml daily   Provides 2060 kcal, 102 gm protein, 1003 ml free water daily  NUTRITION DIAGNOSIS:   Inadequate oral intake related to inability to eat as evidenced by NPO status.  GOAL:   Patient will meet greater than or equal to 90% of their needs - Met with TF.   MONITOR:   TF tolerance  REASON FOR ASSESSMENT:   Consult Enteral/tube feeding initiation and management  ASSESSMENT:   Pt with PMH of epilepsy with last breakthrough sz 11/2022 admitted in status epilepticus.  Meds reviewed: colace, pepcid, sliding scale insulin, miralax. Labs reviewed: WDL.   Pt has been extubated since last assessment and is now on room air. Cortrak remains in place. Pt was seen by SLP this am, who is recommending continued NPO status. Tube feeds are running as ordered. RN reports that the pt is tolerating tube feeds well and is planned to have a swallow study later this afternoon. RD will continue to monitor TF tolerance and diet advancement.   Diet Order:   Diet Order             Diet NPO time specified  Diet effective now                   EDUCATION NEEDS:   Not appropriate for education at this time  Skin:  Skin Assessment: Reviewed RN Assessment  Last BM:  9/9 - type 7  Height:   Ht Readings from Last 1 Encounters:  02/22/23 5\' 1"  (1.549 m)    Weight:   Wt Readings from Last 1 Encounters:  03/01/23 71.5 kg    Ideal Body Weight:     BMI:  Body mass index is 29.78 kg/m.  Estimated Nutritional Needs:   Kcal:  2000-2200  Protein:  90-105 grams  Fluid:  >2 L/day  Bethann Humble, RD, LDN, CNSC.

## 2023-03-01 NOTE — Progress Notes (Signed)
Patient resting comfortably with no respiratory distress noted.  Bipap ordered for PRN.  Not indicated at this time.  Will continue to monitor.

## 2023-03-01 NOTE — Progress Notes (Addendum)
PROGRESS NOTE    Colleen Pearson  GNF:[id number] DOB: [birth year] DOA: 02/22/2023 PCP: Morene Crocker, MD    Brief Narrative:   This is a 32 year old female patient with a history of epilepsy, for which she takes Keppra 1000 mg twice daily, last breakthrough seizure noted in June 2024. Presents to the hospital via EMS after family contacted them for prolonged seizure. Reportedly seizure lasted for approximately 45 minutes. She was still in status epilepticus on EMS arrival requiring multiple doses of midazolam prehospital. On initial evaluation by EDP patient was unresponsive, requiring assisted ventilation via mask. She was intubated loaded with IV Keppra seen by neurology, CT head was negative, additionally received a total of 4 L of crystalloid for intermittent hypotension UDS positive for THC.   32 year old female patient admitted in status epilepticus intubated in the emergency room, received 4 L of crystalloid for intermittent hypotension.  Loaded with Keppra, , started on ceftriaxone for possible aspiration 9/2 ceribell EEG suggesting focal seizure episode at 03 26 Extubated on 9/3 Re intubated 9/4. COVID positive.  Extubated 9/6  Assessment & Plan:   Principal Problem:   Status epilepticus (HCC) Active Problems:   Aspiration pneumonia of both lower lobes (HCC)   Seizure (HCC)   Pneumonia due to COVID-19 virus   Rhabdomyolysis  # Status epilepticus Resolved,neuro has signed off - continue current anti-epileptics kappra and lamictal  # Delirium Appears resolved, now weaned off precedex  # Left thigh pain/swelling No bruise, patient thinks injured when fell. Does have rhabdo - will check PVL and x-ray  # Aspiration pneumonia # MRSA pneumnonia # Covid pneumonia Trachial aspirate mrsa positive. Now weaned off o2 - continue vanc (9/5> )  # AKI 2/2 rhabdo, resolved  # Rhabdomyolysis 2/2 seizure. Ck remains elevated but improving, k and cr are normal - trend  #  Debility Currently being evaluated for CIR  # Dysphagia Currently getting tube feeds - will ask slp to re-eval today, think she's likely safe to swallow  # Tropinemia Tele called today about possible st elevation, EKG shows this to be present in v2 but no contiguous leads. Trop elevated to 600s, improved to 400s on repeat. Absolutely asymptomatic. Discussed w/ dr. Elease Hashimoto of cardiology, trop elevation can present with status and rhabdo so this is the likely etiology. He does not think stemi and does not advise heparin - will check TTE (dr. Elease Hashimoto advises), further cardiac w/u if abnormal or new symptoms develops  DVT prophylaxis: lovenox Code Status: full Family Communication: none @ bedside  Level of care: Progressive Status is: Inpatient Remains inpatient appropriate because: severity of illness    Consultants:  Pccm, neuro  Procedures: eeg  Antimicrobials:  vancomycin    Subjective: Reports feeling much better  Objective: Vitals:   03/01/23 0519 03/01/23 0700 03/01/23 0800 03/01/23 0845  BP:    (!) 138/90  Pulse:  (!) 116 (!) 126 (!) 139  Resp:  (!) 0 (!) 31 (!) 31  Temp:   98.5 F (36.9 C) 98.2 F (36.8 C)  TempSrc:   Oral Oral  SpO2:  96% 97% 96%  Weight: 71.5 kg     Height:        Intake/Output Summary (Last 24 hours) at 03/01/2023 1012 Last data filed at 03/01/2023 0800 Gross per 24 hour  Intake 3338.26 ml  Output 800 ml  Net 2538.26 ml   Filed Weights   02/25/23 0343 02/26/23 0704 03/01/23 0519  Weight: 62.4 kg 66.9 kg 71.5 kg  Examination:  General exam: Appears calm and comfortable  Respiratory system: few scattered rhonchi Cardiovascular system: S1 & S2 heard, tachycardic. No JVD, murmurs, rubs, gallops or clicks.   Gastrointestinal system: Abdomen is nondistended, soft and nontender. No organomegaly or masses felt. Normal bowel sounds heard. Central nervous system: Alert and oriented. No focal neurological deficits. Extremities: Symmetric 5  x 5 power. Skin: No rashes, lesions or ulcers Psychiatry: Judgement and insight appear normal. Mood & affect appropriate.     Data Reviewed: I have personally reviewed following labs and imaging studies  CBC: Recent Labs  Lab 02/23/23 0750 02/24/23 0617 02/25/23 0409 02/25/23 0912 02/25/23 1505 02/26/23 0800 02/27/23 1211 02/28/23 0825 03/01/23 0502  WBC 18.4*  --  9.3  --   --  12.8* 10.6* 10.7* 13.2*  NEUTROABS 16.3*  --   --   --   --   --   --   --   --   HGB 11.1*   < > 8.6*   < > 8.2* 8.0* 9.5* 9.6* 10.8*  HCT 32.7*   < > 24.7*   < > 24.0* 24.5* 28.8* 29.0* 32.4*  MCV 86.3  --  84.3  --   --  88.1 90.0 87.1 87.3  PLT 175  --  169  --   --  187 252 290 407*   < > = values in this interval not displayed.   Basic Metabolic Panel: Recent Labs  Lab 02/22/23 1122 02/22/23 1219 02/22/23 1729 02/23/23 0915 02/24/23 1610 02/24/23 0617 02/24/23 1953 02/25/23 0409 02/25/23 0912 02/25/23 1505 02/26/23 0800 02/27/23 1211 02/28/23 0825 03/01/23 0502  NA  --   --   --  139 141   < >  --  137   < > 140 141 139 139 137  K  --   --   --  3.6 3.6   < >  --  3.4*   < > 4.2 4.7 3.4* 3.9 3.9  CL   < >  --   --  108 111  --   --  102  --   --  111 107 106 106  CO2   < >  --   --  23 18*  --   --  22  --   --  24 25 21* 23  GLUCOSE   < >  --   --  162* 145*  --   --  251*  --   --  126* 113* 137* 62*  BUN   < >  --   --  17 27*  --   --  26*  --   --  23* 28* 21* 18  CREATININE   < >  --   --  1.64* 1.81*  --   --  1.34*  --   --  1.11* 1.01* 0.87 0.92  CALCIUM   < >  --   --  7.1* 7.1*  --   --  7.4*  --   --  7.4* 7.9* 8.0* 8.3*  MG  --   --   --  2.3 2.1  --  2.2  --   --   --   --   --   --   --   PHOS  --  2.5 3.6 3.0  --   --  2.7  --   --   --   --   --   --   --    < > =  values in this interval not displayed.   GFR: Estimated Creatinine Clearance: 79.4 mL/min (by C-G formula based on SCr of 0.92 mg/dL). Liver Function Tests: No results for input(s): "AST", "ALT",  "ALKPHOS", "BILITOT", "PROT", "ALBUMIN" in the last 168 hours. No results for input(s): "LIPASE", "AMYLASE" in the last 168 hours. No results for input(s): "AMMONIA" in the last 168 hours. Coagulation Profile: No results for input(s): "INR", "PROTIME" in the last 168 hours. Cardiac Enzymes: Recent Labs  Lab 02/25/23 0409 02/26/23 0439 02/27/23 1211 02/28/23 0825 03/01/23 0502  CKTOTAL 30,176* 28,375* 31,091* 15,098* 12,220*   BNP (last 3 results) No results for input(s): "PROBNP" in the last 8760 hours. HbA1C: No results for input(s): "HGBA1C" in the last 72 hours. CBG: Recent Labs  Lab 02/28/23 1655 02/28/23 1958 02/28/23 2315 03/01/23 0355 03/01/23 0840  GLUCAP 143* 101* 85 102* 92   Lipid Profile: No results for input(s): "CHOL", "HDL", "LDLCALC", "TRIG", "CHOLHDL", "LDLDIRECT" in the last 72 hours. Thyroid Function Tests: No results for input(s): "TSH", "T4TOTAL", "FREET4", "T3FREE", "THYROIDAB" in the last 72 hours. Anemia Panel: No results for input(s): "VITAMINB12", "FOLATE", "FERRITIN", "TIBC", "IRON", "RETICCTPCT" in the last 72 hours. Urine analysis:    Component Value Date/Time   COLORURINE YELLOW 02/25/2023 0114   APPEARANCEUR CLEAR 02/25/2023 0114   APPEARANCEUR Cloudy 10/04/2014 1606   LABSPEC 1.026 02/25/2023 0114   LABSPEC 1.014 10/04/2014 1606   PHURINE 7.0 02/25/2023 0114   GLUCOSEU >=500 (A) 02/25/2023 0114   GLUCOSEU Negative 10/04/2014 1606   HGBUR MODERATE (A) 02/25/2023 0114   BILIRUBINUR NEGATIVE 02/25/2023 0114   BILIRUBINUR Negative 10/04/2014 1606   KETONESUR 5 (A) 02/25/2023 0114   PROTEINUR 100 (A) 02/25/2023 0114   UROBILINOGEN 1.0 09/29/2014 2122   NITRITE NEGATIVE 02/25/2023 0114   LEUKOCYTESUR NEGATIVE 02/25/2023 0114   LEUKOCYTESUR Trace 10/04/2014 1606   Sepsis Labs: @LABRCNTIP (procalcitonin:4,lacticidven:4)  ) Recent Results (from the past 240 hour(s))  Urine Culture     Status: None   Collection Time: 02/22/23 12:37 AM    Specimen: Urine, Catheterized  Result Value Ref Range Status   Specimen Description URINE, CATHETERIZED  Final   Special Requests NONE  Final   Culture   Final    NO GROWTH Performed at [facility], [address] [place], [place] [address]    Report Status 02/23/2023 FINAL  Final  Culture, blood (Routine X 2) w Reflex to ID Panel     Status: None   Collection Time: 02/22/23  2:44 AM   Specimen: BLOOD  Result Value Ref Range Status   Specimen Description BLOOD RIGHT WRIST  Final   Special Requests   Final    BOTTLES DRAWN AEROBIC AND ANAEROBIC Blood Culture adequate volume   Culture   Final    NO GROWTH 5 DAYS Performed at [facility], [address] [place], [place] [address]    Report Status 02/27/2023 FINAL  Final  Culture, blood (Routine X 2) w Reflex to ID Panel     Status: None   Collection Time: 02/22/23  2:46 AM   Specimen: BLOOD  Result Value Ref Range Status   Specimen Description BLOOD RIGHT ANTECUBITAL  Final   Special Requests   Final    BOTTLES DRAWN AEROBIC AND ANAEROBIC Blood Culture adequate volume   Culture   Final    NO GROWTH 5 DAYS Performed at [facility], [address], [place], [place] [address]    Report Status 02/27/2023 FINAL  Final  MRSA Next Gen by PCR, Nasal     Status: Abnormal   Collection Time: 02/22/23  3:47 AM   Specimen: Nasal Mucosa; Nasal Swab  Result Value Ref Range Status   MRSA by PCR Next Gen DETECTED (A) NOT DETECTED Final    Comment: RESULT CALLED TO, READ BACK BY AND VERIFIED WITH: J TIRRELL,RN@0549  02/22/23 MK (NOTE) The GeneXpert MRSA Assay (FDA approved for NASAL specimens only), is one component of a comprehensive MRSA colonization surveillance program. It is not intended to diagnose MRSA infection nor to guide or monitor treatment for MRSA infections. Test performance is not FDA approved in patients less than 28 years old. Performed at [facility], [address], [place],  [place] [address]   Culture, Respiratory w Gram Stain     Status: None   Collection Time: 02/23/23  8:19 AM   Specimen: Tracheal Aspirate; Respiratory  Result Value Ref Range Status   Specimen Description TRACHEAL ASPIRATE  Final   Special Requests NONE  Final   Gram Stain   Final    FEW WBC PRESENT, PREDOMINANTLY PMN NO ORGANISMS SEEN Performed at [facility], [address], [place], [place] [address]    Culture RARE METHICILLIN RESISTANT STAPHYLOCOCCUS AUREUS  Final   Report Status 02/25/2023 FINAL  Final   Organism ID, Bacteria METHICILLIN RESISTANT STAPHYLOCOCCUS AUREUS  Final      Susceptibility   Methicillin resistant staphylococcus aureus - MIC*    CIPROFLOXACIN <=0.5 SENSITIVE Sensitive     ERYTHROMYCIN <=0.25 SENSITIVE Sensitive     GENTAMICIN <=0.5 SENSITIVE Sensitive     OXACILLIN >=4 RESISTANT Resistant     TETRACYCLINE <=1 SENSITIVE Sensitive     VANCOMYCIN <=0.5 SENSITIVE Sensitive     TRIMETH/SULFA <=10 SENSITIVE Sensitive     CLINDAMYCIN <=0.25 SENSITIVE Sensitive     RIFAMPIN <=0.5 SENSITIVE Sensitive     Inducible Clindamycin NEGATIVE Sensitive     LINEZOLID 2 SENSITIVE Sensitive     * RARE METHICILLIN RESISTANT STAPHYLOCOCCUS AUREUS  SARS Coronavirus 2 by RT PCR (hospital order, performed in [facility] hospital lab) *cepheid single result test* Anterior Nasal Swab     Status: Abnormal   Collection Time: 02/24/23  7:21 AM   Specimen: Anterior Nasal Swab  Result Value Ref Range Status   SARS Coronavirus 2 by RT PCR POSITIVE (A) NEGATIVE Final    Comment: Performed at [facility], [address], [place], [place] [address]  Culture, Respiratory w Gram Stain     Status: None   Collection Time: 02/24/23 10:49 AM   Specimen: Bronchoalveolar Lavage; Respiratory  Result Value Ref Range Status   Specimen Description BRONCHIAL ALVEOLAR LAVAGE  Final   Special Requests NONE  Final   Gram Stain   Final    ABUNDANT WBC PRESENT, PREDOMINANTLY PMN NO ORGANISMS  SEEN Performed at [facility], [address], [place], [place] [address]    Culture RARE CANDIDA DUBLINIENSIS  Final   Report Status 02/28/2023 FINAL  Final         Radiology Studies: DG CHEST PORT 1 VIEW  Result Date: 02/27/2023 CLINICAL DATA:  Hypoxia. EXAM: PORTABLE CHEST 1 VIEW COMPARISON:  February 25, 2023. FINDINGS: Stable cardiomediastinal silhouette. Interval development of bilateral lung opacities most consistent with pulmonary edema with small bilateral pleural effusions. Feeding tube is seen entering stomach. Bony thorax is unremarkable. IMPRESSION: Interval development of bilateral diffuse lung opacities most consistent with pulmonary edema and small  pleural effusions. Electronically Signed   By: Lupita Raider M.D.   On: 02/27/2023 11:50        Scheduled Meds:  Chlorhexidine Gluconate Cloth  6 each Topical Daily   dexamethasone (DECADRON) injection  6 mg Intravenous Q24H   docusate  100 mg Per Tube BID   [START ON 03/02/2023] enoxaparin (LOVENOX) injection  40 mg Subcutaneous Q24H   famotidine  20 mg Per Tube Daily   feeding supplement (PROSource TF20)  60 mL Per Tube Daily   Gerhardt's butt cream   Topical Daily   heparin  5,000 Units Subcutaneous Q8H   insulin aspart  0-15 Units Subcutaneous Q4H   insulin aspart  3 Units Subcutaneous Q4H   insulin glargine-yfgn  10 Units Subcutaneous BID   lamoTRIgine  150 mg Per Tube BID   pantoprazole (PROTONIX) IV  40 mg Intravenous Daily   polyethylene glycol  17 g Per Tube Daily   QUEtiapine  100 mg Per Tube QHS   QUEtiapine  50 mg Per Tube Q1400   Continuous Infusions:  sodium chloride     sodium chloride Stopped (02/24/23 1217)   feeding supplement (OSMOLITE 1.5 CAL) 55 mL/hr at 03/01/23 0800   lactated ringers 100 mL/hr at 03/01/23 0800   levETIRAcetam 1,000 mg (02/28/23 2129)   vancomycin 1,000 mg (03/01/23 0948)     LOS: 7 days     Silvano Bilis, MD [facility]   If 7PM-7AM, please contact  night-coverage [email or web address] Password [email or web address] 03/01/2023, 10:12 AM

## 2023-03-01 NOTE — Progress Notes (Addendum)
Modified Barium Swallow Study  Patient Details  Name: Colleen Pearson MRN: [id number] Date of Birth: Feb 05, 1991  Today's Date: 03/01/2023  Modified Barium Swallow completed.  Full report located under Chart Review in the Imaging Section.  History of Present Illness Colleen Pearson is a 32 yo female presenting to ED 9/2 from home after a prolonged seizure (lasted at least 45 minutes continuously). Intubated upon EMS arrival 9/2-9/3, reintubated 9/4-9/6. Cortrak placed 9/4.  CTH 9/2 unremarkable. CXR 9/3 with new increased hazy opacities in lower lung fields and mid perihilar areas. PMH includes asthma, THC and cocaine use, endometriosis, ovarian cyst, seizures   Clinical Impression Pt presents with a moderate oropharyngeal dysphagia characterized by impaired timing and sensation. Pt with poor bolus control resulting in posterior progression of less than half of thin liquid boluses to the level of the valleculae before being cued to swallow. This appears to happen frequently even in spontaneous sips with boluses reaching the level of the posterior surface of the epiglottis prior to initiation of laryngeal vestibule closure. This results in consistent penetration of liquid boluses, with thin liquids and nectar thick liquids initially progressing to the level of the vocal folds without sensation and subsequently resulting in trace silent aspiration (PAS 8) during cued cough attempts. This was improved although not completely resolved by a cued chin tuck, resulting in penetration remaining above the vocal folds (PAS 3) with thin liquids and nectar thick liquids. Question pt's ability to consistently use these compensatory strategies without full supervision to cue due to her impulisivity and cognition. No further penetration/aspiration noted with trials of honey thick liquids, purees, or solids. Pt presents with a strong cough, although her laryngeal sensation appears to be impaired, which may be  representative of ETT trauma. Given pt's cognition and respiratory status, recommend initiating diet of Dys 2 textures with honey thick liquids and full supervision to cue pt to clear her throat intermittently. Recommend meds be given crushed in puree. Feel that she shows great potential to upgrade diet given intensive SLP f/u to ensure consistent use of compensatory strategies. Will continue to follow. Factors that may increase risk of adverse event in presence of aspiration Colleen Pearson & Clearance Colleen Pearson 2021): Presence of tubes (ETT, trach, NG, etc.);Aspiration of thick, dense, and/or acidic materials  Swallow Evaluation Recommendations Recommendations: PO diet PO Diet Recommendation: Moderately thick liquids (Level 3, honey thick);Dysphagia 2 (Finely chopped) Liquid Administration via: Spoon;Cup Medication Administration: Crushed with puree Supervision: Patient able to self-feed;Full supervision/cueing for swallowing strategies Swallowing strategies  : Minimize environmental distractions;Slow rate;Small bites/sips;Clear throat intermittently Postural changes: Position pt fully upright for meals;Stay upright 30-60 min after meals Oral care recommendations: Oral care BID (2x/day)   Gwynneth Aliment, M.A., CF-SLP Speech Language Pathology, Acute Rehabilitation Services  Secure Chat preferred [phone]  03/01/2023,3:55 PM

## 2023-03-02 ENCOUNTER — Inpatient Hospital Stay (HOSPITAL_COMMUNITY): Payer: Medicaid Other

## 2023-03-02 DIAGNOSIS — M7989 Other specified soft tissue disorders: Secondary | ICD-10-CM | POA: Diagnosis not present

## 2023-03-02 DIAGNOSIS — G40901 Epilepsy, unspecified, not intractable, with status epilepticus: Secondary | ICD-10-CM | POA: Diagnosis not present

## 2023-03-02 DIAGNOSIS — R079 Chest pain, unspecified: Secondary | ICD-10-CM

## 2023-03-02 DIAGNOSIS — R0602 Shortness of breath: Secondary | ICD-10-CM

## 2023-03-02 DIAGNOSIS — R7989 Other specified abnormal findings of blood chemistry: Secondary | ICD-10-CM | POA: Diagnosis not present

## 2023-03-02 LAB — GLUCOSE, CAPILLARY
Glucose-Capillary: 115 mg/dL — ABNORMAL HIGH (ref 70–99)
Glucose-Capillary: 146 mg/dL — ABNORMAL HIGH (ref 70–99)
Glucose-Capillary: 142 mg/dL — ABNORMAL HIGH (ref 70–99)

## 2023-03-02 LAB — BASIC METABOLIC PANEL
Anion gap: 17 — ABNORMAL HIGH (ref 5–15)
BUN: 14 mg/dL (ref 6–20)
CO2: 21 mmol/L — ABNORMAL LOW (ref 22–32)
Calcium: 9.2 mg/dL (ref 8.9–10.3)
Chloride: 102 mmol/L (ref 98–111)
Creatinine, Ser: 0.85 mg/dL (ref 0.44–1.00)
GFR, Estimated: >60 mL/min (ref >60)
Glucose, Bld: 144 mg/dL — ABNORMAL HIGH (ref 70–99)
Potassium: 4 mmol/L (ref 3.5–5.1)
Sodium: 140 mmol/L (ref 135–145)

## 2023-03-02 LAB — CK: Total CK: 5552 U/L — ABNORMAL HIGH (ref 38–234)

## 2023-03-02 LAB — ECHOCARDIOGRAM COMPLETE
Area-P 1/2: 5.42 cm2
Height: 61 in
MV M vel: 1.33 m/s
MV Peak grad: 7.1 mmHg
S' Lateral: 3 cm
Weight: 2522.06 [oz_av]

## 2023-03-02 MED ORDER — GUAIFENESIN-DM 100-10 MG/5ML PO SYRP
10.0000 mL | ORAL_SOLUTION | Freq: Four times a day (QID) | ORAL | Status: DC | PRN
  Administered 2023-03-02: 10 mL via ORAL
  Filled 2023-03-02: qty 10

## 2023-03-02 MED ORDER — BENZONATATE 100 MG PO CAPS
100.0000 mg | ORAL_CAPSULE | Freq: Once | ORAL | Status: AC
  Administered 2023-03-02: 100 mg via ORAL
  Filled 2023-03-02: qty 1

## 2023-03-02 MED ORDER — LEVETIRACETAM 500 MG PO TABS
1000.0000 mg | ORAL_TABLET | Freq: Two times a day (BID) | ORAL | Status: DC
  Administered 2023-03-02 – 2023-03-03 (×3): 1000 mg
  Filled 2023-03-02 (×3): qty 2

## 2023-03-02 NOTE — Progress Notes (Signed)
Physical Therapy Treatment Patient Details Name: Colleen Pearson MRN: [id number] DOB: 1990-09-29 Today's Date: 03/02/2023   History of Present Illness 32 y.o. female admitted 9/2 with prolonged seizure requiring intubation and Keppra. Intubated (9/2-9/3, 9/4-9/6). 9/4 Covid (+). PMHx: epilepsy    PT Comments  Pt making good improvement today with cognition and mobility. Had a fall yesterday while transferring to [facility]. Pt tends to move impulsively and does not attend to body position or environment very well. Ambulated 10' with HHA and 1 LOB, then 4' with RW and min A with 1 LOB while turning. HR into 140's with mobility today. PT will continue to follow.     If plan is discharge home, recommend the following: A lot of help with walking and/or transfers;A lot of help with bathing/dressing/bathroom;Assistance with cooking/housework;Direct supervision/assist for medications management;Assist for transportation;Supervision due to cognitive status;Direct supervision/assist for financial management;Help with stairs or ramp for entrance   Can travel by private vehicle        Equipment Recommendations  Rolling walker (2 wheels);BSC/3in1    Recommendations for Other Services Speech consult;Rehab consult;OT consult     Precautions / Restrictions Precautions Precautions: Fall;Other (comment) Precaution Comments: watch sats and HR Restrictions Weight Bearing Restrictions: No     Mobility  Bed Mobility Overal bed mobility: Needs Assistance Bed Mobility: Supine to Sit     Supine to sit: Supervision     General bed mobility comments: supervision with increased time    Transfers Overall transfer level: Needs assistance Equipment used: Rolling walker (2 wheels), None Transfers: Sit to/from Stand Sit to Stand: Contact guard assist           General transfer comment: pt stood without physical assist but had 1 LOB when standing from toilet    Ambulation/Gait Ambulation/Gait  assistance: Min assist, +2 safety/equipment Gait Distance (Feet): 40 Feet (10', 30') Assistive device: Rolling walker (2 wheels) Gait Pattern/deviations: Decreased stride length, Wide base of support, Trunk flexed, Drifts right/left Gait velocity: decreased Gait velocity interpretation: 1.31 - 2.62 ft/sec, indicative of limited community ambulator   General Gait Details: ambulated into bathroom with HHA, tends to keep neck flexed looking down, vc's to scan forward. Then ambulated 30' with RW and min A. Mildly unsteady with turns and does not attend well to self or environment   Stairs             Wheelchair Mobility     Tilt Bed    Modified Rankin (Stroke Patients Only)       Balance Overall balance assessment: Needs assistance Sitting-balance support: No upper extremity supported, Feet supported Sitting balance-Leahy Scale: Good Sitting balance - Comments: dynamic on toilet and EOB   Standing balance support: Single extremity supported, Bilateral upper extremity supported Standing balance-Leahy Scale: Poor Standing balance comment: min assist for standing, practiced reaching down for object on floor, successful with min A                            Cognition Arousal: Alert Behavior During Therapy: WFL for tasks assessed/performed Overall Cognitive Status: Impaired/Different from baseline Area of Impairment: Attention, Memory, Following commands, Safety/judgement, Problem solving, Awareness                   Current Attention Level: Selective Memory: Decreased short-term memory Following Commands: Follows one step commands consistently, Follows multi-step commands with increased time, Follows multi-step commands consistently Safety/Judgement: Decreased awareness of safety, Decreased awareness of deficits  Awareness: Emergent Problem Solving: Slow processing, Requires verbal cues, Requires tactile cues General Comments: pt calm and appropriate  throughout session today, still with decreased attention to self during mobility though which decreases her safety        Exercises      General Comments General comments (skin integrity, edema, etc.): HR 120's at rest, into 140's with mobility      Pertinent Vitals/Pain Pain Assessment Pain Assessment: No/denies pain    Home Living Family/patient expects to be discharged to:: Private residence Living Arrangements: Parent;Children (Lives with Mother and 5 y.o. daughter, Colleen Pearson) Available Help at Discharge: Family;Available 24 hours/day Type of Home: House Home Access: Stairs to enter Entrance Stairs-Rails: Right;Left;Can reach both Entrance Stairs-Number of Steps: 3-4 Alternate Level Stairs-Number of Steps: flight Home Layout: Two level;Able to live on main level with bedroom/bathroom Home Equipment: None      Prior Function            PT Goals (current goals can now be found in the care plan section) Acute Rehab PT Goals Patient Stated Goal: return home with mom PT Goal Formulation: Patient unable to participate in goal setting Time For Goal Achievement: 03/14/23 Potential to Achieve Goals: Fair Progress towards PT goals: Progressing toward goals    Frequency    Min 1X/week      PT Plan      Co-evaluation              AM-PAC PT "6 Clicks" Mobility   Outcome Measure  Help needed turning from your back to your side while in a flat bed without using bedrails?: A Little Help needed moving from lying on your back to sitting on the side of a flat bed without using bedrails?: A Little Help needed moving to and from a bed to a chair (including a wheelchair)?: A Little Help needed standing up from a chair using your arms (e.g., wheelchair or bedside chair)?: A Little Help needed to walk in hospital room?: A Lot Help needed climbing 3-5 steps with a railing? : A Lot 6 Click Score: 16    End of Session Equipment Utilized During Treatment: Gait  belt Activity Tolerance: Patient tolerated treatment well Patient left: in chair;with call bell/phone within reach;with chair alarm set Nurse Communication: Mobility status PT Visit Diagnosis: Other abnormalities of gait and mobility (R26.89);Muscle weakness (generalized) (M62.81);Unsteadiness on feet (R26.81);Other symptoms and signs involving the nervous system (R29.898)     Time: 1234-1300 PT Time Calculation (min) (ACUTE ONLY): 26 min  Charges:    $Gait Training: 8-22 mins $Therapeutic Activity: 8-22 mins PT General Charges $$ ACUTE PT VISIT: 1 Visit                     Lyanne Co, PT  Acute Rehab Services Secure chat preferred Office [phone]    Lawana Chambers Nyilah Kight 03/02/2023, 2:33 PM

## 2023-03-02 NOTE — Evaluation (Signed)
Speech Language Pathology Evaluation Patient Details Name: Colleen Pearson MRN: [id number] DOB: 1990/09/05 Today's Date: 03/02/2023 Time: 2993-7169 SLP Time Calculation (min) (ACUTE ONLY): 20 min  Problem List:  Patient Active Problem List   Diagnosis Date Noted   Pneumonia due to COVID-19 virus 03/01/2023   Rhabdomyolysis 03/01/2023   Aspiration pneumonia of both lower lobes (HCC) 02/24/2023   Seizure (HCC) 02/24/2023   Status epilepticus (HCC) 02/22/2023   Nexplanon in place 11/04/2020   Family history of Down syndrome    Past Medical History:  Past Medical History:  Diagnosis Date   Asthma    Cocaine use complicating pregnancy in third trimester 08/30/2017   Endometriosis    Ovarian cyst    Seizures (HCC)    Status post cesarean section 08/30/2017   Past Surgical History:  Past Surgical History:  Procedure Laterality Date   CESAREAN SECTION N/A 08/30/2017   Procedure: CESAREAN SECTION;  Surgeon: Conard Novak, MD;  Location: [facility] ORS;  Service: Obstetrics;  Laterality: N/A;   LASER ABLATION/CAUTERIZATION OF ENDOMETRIAL IMPLANTS     HPI:  Colleen Pearson is a 32 yo female presenting to ED 9/2 from home after a prolonged seizure (lasted at least 45 minutes continuously). Intubated upon EMS arrival 9/2-9/3, reintubated 9/4-9/6. Cortrak placed 9/4.  CTH 9/2 unremarkable. CXR 9/3 with new increased hazy opacities in lower lung fields and mid perihilar areas. PMH includes asthma, THC and cocaine use, endometriosis, ovarian cyst, seizures   Assessment / Plan / Recommendation Clinical Impression  Pt reports living at home with her mother, step father, and daughter. 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 She states that she typically is independent at baseline, managing her finances and medications without assistance. She presents with a hoarse vocal quality that she reports is different from baseline. Pt scored an 18/30 on the SLUMS (a  score of 25 or above is considered WFL when adjusted for pt's level of completed education) characterized primarily by deficits related to sustained attention and problem solving.Pt demonstrated significant difficutly with the clock drawing task, initially demonstrating signs of L side neglect and then self-correcting, although still will seemingly visuospatial errors and deficits related to planning. She was only able to accurately answer 1/4 questions following a read-aloud paragraph. Overall, pt is exhibiting clinical signs indicative of acute cognitive deficits and may benefit from ongoing SLP f/u to facilitate return to PLOF.    SLP Assessment  SLP Recommendation/Assessment: Patient needs continued Speech Lanaguage Pathology Services SLP Visit Diagnosis: Cognitive communication deficit (R41.841)    Recommendations for follow up therapy are one component of a multi-disciplinary discharge planning process, led by the attending physician.  Recommendations may be updated based on patient status, additional functional criteria and insurance authorization.    Follow Up Recommendations  Acute inpatient rehab (3hours/day)    Assistance Recommended at Discharge  Frequent or constant Supervision/Assistance  Functional Status Assessment Patient has had a recent decline in their functional status and demonstrates the ability to make significant improvements in function in a reasonable and predictable amount of time.  Frequency and Duration min 2x/week  2 weeks      SLP Evaluation Cognition  Overall Cognitive Status: Impaired/Different from baseline Arousal/Alertness: Awake/alert Orientation Level: Oriented X4 Attention: Sustained Sustained Attention: Impaired Sustained Attention Impairment: Verbal basic;Functional basic Memory: Appears intact Awareness: Appears intact Problem Solving: Impaired Problem Solving Impairment: Verbal basic;Functional basic       Comprehension  Auditory  Comprehension Overall Auditory  Comprehension: Appears within functional limits for tasks assessed    Expression Expression Primary Mode of Expression: Verbal Verbal Expression Overall Verbal Expression: Appears within functional limits for tasks assessed Written Expression Dominant Hand: Right   Oral / Motor  Oral Motor/Sensory Function Overall Oral Motor/Sensory Function: Within functional limits Motor Speech Overall Motor Speech: Impaired Respiration: Within functional limits Phonation: Hoarse Resonance: Within functional limits Articulation: Within functional limitis Intelligibility: Intelligible Motor Planning: Witnin functional limits            Gwynneth Aliment, M.A., CF-SLP Speech Language Pathology, Acute Rehabilitation Services  Secure Chat preferred [phone]  03/02/2023, 1:54 PM

## 2023-03-02 NOTE — Progress Notes (Signed)
PROGRESS NOTE    Colleen Pearson  GEX:[id number] DOB: 01-Apr-1991 DOA: 02/22/2023 PCP: Morene Crocker, MD    Brief Narrative:  This is a 32 year old female patient with a history of epilepsy, for which she takes Keppra 1000 mg twice daily, last breakthrough seizure noted in June 2024. Presented to the hospital via EMS after family contacted them for prolonged seizure. Reportedly seizure lasted for approximately 45 minutes. She was still in status epilepticus on EMS arrival requiring multiple doses of midazolam prehospital. On initial evaluation by EDP patient was unresponsive, requiring assisted ventilation via mask. She was intubated, loaded with IV Keppra seen by neurology, CT head was negative, additionally received a total of 4 L of crystalloid for intermittent hypotension. UDS positive for THC.   32 year old female patient admitted in status epilepticus intubated in the emergency room, received 4 L of crystalloid for intermittent hypotension.  Loaded with Keppra, , started on ceftriaxone for possible aspiration 9/2  EEG suggesting focal seizure episode  Extubated on 9/3 Re intubated 9/4. COVID positive.  Extubated 9/6  Assessment & Plan:   Principal Problem:   Status epilepticus (HCC) Active Problems:   Aspiration pneumonia of both lower lobes (HCC)   Seizure (HCC)   Pneumonia due to COVID-19 virus   Rhabdomyolysis  # Status epilepticus Resolved,neuro has signed off - continue current anti-epileptics kappra and lamictal  # Delirium Appears resolved, now weaned off precedex, remains on scheduled seroquel and prn ativan and haldol. Last dose used more than 24 hours ago.  Discontinue Ativan for delirium.  Can use for seizure.  # Left thigh pain/swelling.  Traumatic rhabdomyolysis.  Likely rupture of the muscles. - negative x ray.  Symptomatic treatment.  # Aspiration pneumonia # MRSA pneumnonia # Covid pneumonia Trachial aspirate mrsa positive. Now weaned off o2 - on  Vancomycin - now on linezolid.  Complete 7 days of therapy. -Patient on IV dexamethasone.  Completed 7 days of therapy.  Will stop further.  # AKI 2/2 rhabdo, resolved  # Rhabdomyolysis 2/2 seizure. Ck remains elevated but improving, k and cr are normal - trend.  Will discontinue IV fluids.  # Debility Currently being evaluated for CIR.  Stable.  # Dysphagia Currently getting tube feeds, she is able to eat.  Discontinue NG tube.  Advance diet.  # Demand Ischemia :  Troponin elevated but flat . No chest pain. Echo pending.   Medically improving.  Stable to transfer to acute inpatient rehab.  DVT prophylaxis: lovenox Code Status: full Family Communication: None at the bedside  Level of care: Progressive Status is: Inpatient Remains inpatient appropriate because: severity of illness    Consultants:  Pccm, neuro  Procedures: eeg  Antimicrobials:  vancomycin - linezolid   Subjective: Patient seen and examined.  She has dry cough and sore throat otherwise denies any complaints.  Feels very weak.  She needed nurses assistance to get out of the bed. Left thigh still swollen and painful.  Objective: Vitals:   03/01/23 2037 03/01/23 2310 03/02/23 0251 03/02/23 0753  BP: 120/84 127/67  130/82  Pulse: (!) 104 (!) 110  (!) 107  Resp: 20 20 20 16   Temp: 98.1 F (36.7 C) 97.7 F (36.5 C) 97.8 F (36.6 C) 98.3 F (36.8 C)  TempSrc: Oral Oral Oral Oral  SpO2: 98% 100%  95%  Weight:      Height:        Intake/Output Summary (Last 24 hours) at 03/02/2023 1109 Last data filed at 03/02/2023 [phone]  Gross per 24 hour  Intake --  Output 1200 ml  Net -1200 ml   Filed Weights   02/25/23 0343 02/26/23 0704 03/01/23 0519  Weight: 62.4 kg 66.9 kg 71.5 kg    Examination:  General: Looks fairly comfortable.  Alert awake and oriented.  In normal mood. Cardiovascular: S1-S2 normal.  Regular rate rhythm. Respiratory: Bilateral clear.  No added sounds.  She does have some dry  cough. Gastrointestinal: Soft.  Nontender.  Bowel sound present.  NG tube infusing. Ext: No cyanosis or edema. Left thigh with diffuse swelling, tender to palpation.  No erythema redness.  Normal temperature. Neuro: No deficits.   Data Reviewed: I have personally reviewed following labs and imaging studies  CBC: Recent Labs  Lab 02/25/23 0409 02/25/23 0912 02/25/23 1505 02/26/23 0800 02/27/23 1211 02/28/23 0825 03/01/23 0502  WBC 9.3  --   --  12.8* 10.6* 10.7* 13.2*  HGB 8.6*   < > 8.2* 8.0* 9.5* 9.6* 10.8*  HCT 24.7*   < > 24.0* 24.5* 28.8* 29.0* 32.4*  MCV 84.3  --   --  88.1 90.0 87.1 87.3  PLT 169  --   --  187 252 290 407*   < > = values in this interval not displayed.   Basic Metabolic Panel: Recent Labs  Lab 02/24/23 0613 02/24/23 0617 02/24/23 1953 02/25/23 0409 02/26/23 0800 02/27/23 1211 02/28/23 0825 03/01/23 0502 03/02/23 0807  NA 141   < >  --    < > 141 139 139 137 140  K 3.6   < >  --    < > 4.7 3.4* 3.9 3.9 4.0  CL 111  --   --    < > 111 107 106 106 102  CO2 18*  --   --    < > 24 25 21* 23 21*  GLUCOSE 145*  --   --    < > 126* 113* 137* 62* 144*  BUN 27*  --   --    < > 23* 28* 21* 18 14  CREATININE 1.81*  --   --    < > 1.11* 1.01* 0.87 0.92 0.85  CALCIUM 7.1*  --   --    < > 7.4* 7.9* 8.0* 8.3* 9.2  MG 2.1  --  2.2  --   --   --   --   --   --   PHOS  --   --  2.7  --   --   --   --   --   --    < > = values in this interval not displayed.   GFR: Estimated Creatinine Clearance: 85.9 mL/min (by C-G formula based on SCr of 0.85 mg/dL). Liver Function Tests: No results for input(s): "AST", "ALT", "ALKPHOS", "BILITOT", "PROT", "ALBUMIN" in the last 168 hours. No results for input(s): "LIPASE", "AMYLASE" in the last 168 hours. No results for input(s): "AMMONIA" in the last 168 hours. Coagulation Profile: No results for input(s): "INR", "PROTIME" in the last 168 hours. Cardiac Enzymes: Recent Labs  Lab 02/26/23 0439 02/27/23 1211  02/28/23 0825 03/01/23 0502 03/02/23 0807  CKTOTAL 28,375* 31,091* 15,098* 12,220* 5,552*   BNP (last 3 results) No results for input(s): "PROBNP" in the last 8760 hours. HbA1C: No results for input(s): "HGBA1C" in the last 72 hours. CBG: Recent Labs  Lab 03/01/23 1621 03/01/23 2044 03/01/23 2309 03/02/23 0411 03/02/23 0751  GLUCAP 88 107* 147* 115* 146*   Lipid Profile: No  results for input(s): "CHOL", "HDL", "LDLCALC", "TRIG", "CHOLHDL", "LDLDIRECT" in the last 72 hours. Thyroid Function Tests: No results for input(s): "TSH", "T4TOTAL", "FREET4", "T3FREE", "THYROIDAB" in the last 72 hours. Anemia Panel: No results for input(s): "VITAMINB12", "FOLATE", "FERRITIN", "TIBC", "IRON", "RETICCTPCT" in the last 72 hours. Urine analysis:    Component Value Date/Time   COLORURINE YELLOW 02/25/2023 0114   APPEARANCEUR CLEAR 02/25/2023 0114   APPEARANCEUR Cloudy 10/04/2014 1606   LABSPEC 1.026 02/25/2023 0114   LABSPEC 1.014 10/04/2014 1606   PHURINE 7.0 02/25/2023 0114   GLUCOSEU >=500 (A) 02/25/2023 0114   GLUCOSEU Negative 10/04/2014 1606   HGBUR MODERATE (A) 02/25/2023 0114   BILIRUBINUR NEGATIVE 02/25/2023 0114   BILIRUBINUR Negative 10/04/2014 1606   KETONESUR 5 (A) 02/25/2023 0114   PROTEINUR 100 (A) 02/25/2023 0114   UROBILINOGEN 1.0 09/29/2014 2122   NITRITE NEGATIVE 02/25/2023 0114   LEUKOCYTESUR NEGATIVE 02/25/2023 0114   LEUKOCYTESUR Trace 10/04/2014 1606   Sepsis Labs: @LABRCNTIP (procalcitonin:4,lacticidven:4)  ) Recent Results (from the past 240 hour(s))  Urine Culture     Status: None   Collection Time: 02/22/23 12:37 AM   Specimen: Urine, Catheterized  Result Value Ref Range Status   Specimen Description URINE, CATHETERIZED  Final   Special Requests NONE  Final   Culture   Final    NO GROWTH Performed at [facility], [address], [place], [place] [address]    Report Status 02/23/2023 FINAL  Final  Culture, blood (Routine X 2) w Reflex to  ID Panel     Status: None   Collection Time: 02/22/23  2:44 AM   Specimen: BLOOD  Result Value Ref Range Status   Specimen Description BLOOD RIGHT WRIST  Final   Special Requests   Final    BOTTLES DRAWN AEROBIC AND ANAEROBIC Blood Culture adequate volume   Culture   Final    NO GROWTH 5 DAYS Performed at [facility], [address] [place], [place] [address]    Report Status 02/27/2023 FINAL  Final  Culture, blood (Routine X 2) w Reflex to ID Panel     Status: None   Collection Time: 02/22/23  2:46 AM   Specimen: BLOOD  Result Value Ref Range Status   Specimen Description BLOOD RIGHT ANTECUBITAL  Final   Special Requests   Final    BOTTLES DRAWN AEROBIC AND ANAEROBIC Blood Culture adequate volume   Culture   Final    NO GROWTH 5 DAYS Performed at [facility], [address], [place], [place] [address]    Report Status 02/27/2023 FINAL  Final  MRSA Next Gen by PCR, Nasal     Status: Abnormal   Collection Time: 02/22/23  3:47 AM   Specimen: Nasal Mucosa; Nasal Swab  Result Value Ref Range Status   MRSA by PCR Next Gen DETECTED (A) NOT DETECTED Final    Comment: RESULT CALLED TO, READ BACK BY AND VERIFIED WITH: J TIRRELL,RN@0549  02/22/23 MK (NOTE) The GeneXpert MRSA Assay (FDA approved for NASAL specimens only), is one component of a comprehensive MRSA colonization surveillance program. It is not intended to diagnose MRSA infection nor to guide or monitor treatment for MRSA infections. Test performance is not FDA approved in patients less than 67 years old. Performed at [facility], [address] [place], [place] [address]   Culture, Respiratory w Gram Stain     Status: None   Collection Time: 02/23/23  8:19 AM   Specimen: Tracheal  Aspirate; Respiratory  Result Value Ref Range Status   Specimen Description TRACHEAL ASPIRATE  Final   Special Requests NONE  Final   Gram Stain   Final    FEW WBC PRESENT, PREDOMINANTLY PMN NO ORGANISMS SEEN Performed  at [facility], [address], [place], [place] [address]    Culture RARE METHICILLIN RESISTANT STAPHYLOCOCCUS AUREUS  Final   Report Status 02/25/2023 FINAL  Final   Organism ID, Bacteria METHICILLIN RESISTANT STAPHYLOCOCCUS AUREUS  Final      Susceptibility   Methicillin resistant staphylococcus aureus - MIC*    CIPROFLOXACIN <=0.5 SENSITIVE Sensitive     ERYTHROMYCIN <=0.25 SENSITIVE Sensitive     GENTAMICIN <=0.5 SENSITIVE Sensitive     OXACILLIN >=4 RESISTANT Resistant     TETRACYCLINE <=1 SENSITIVE Sensitive     VANCOMYCIN <=0.5 SENSITIVE Sensitive     TRIMETH/SULFA <=10 SENSITIVE Sensitive     CLINDAMYCIN <=0.25 SENSITIVE Sensitive     RIFAMPIN <=0.5 SENSITIVE Sensitive     Inducible Clindamycin NEGATIVE Sensitive     LINEZOLID 2 SENSITIVE Sensitive     * RARE METHICILLIN RESISTANT STAPHYLOCOCCUS AUREUS  SARS Coronavirus 2 by RT PCR (hospital order, performed in [facility] hospital lab) *cepheid single result test* Anterior Nasal Swab     Status: Abnormal   Collection Time: 02/24/23  7:21 AM   Specimen: Anterior Nasal Swab  Result Value Ref Range Status   SARS Coronavirus 2 by RT PCR POSITIVE (A) NEGATIVE Final    Comment: Performed at [facility], [address], [place], [place] [address]  Culture, Respiratory w Gram Stain     Status: None   Collection Time: 02/24/23 10:49 AM   Specimen: Bronchoalveolar Lavage; Respiratory  Result Value Ref Range Status   Specimen Description BRONCHIAL ALVEOLAR LAVAGE  Final   Special Requests NONE  Final   Gram Stain   Final    ABUNDANT WBC PRESENT, PREDOMINANTLY PMN NO ORGANISMS SEEN Performed at [facility], [address], [place], [place] [address]    Culture RARE CANDIDA DUBLINIENSIS  Final   Report Status 02/28/2023 FINAL  Final         Radiology Studies: DG HIP PORT UNILAT WITH PELVIS 1V LEFT  Result Date: 03/01/2023 CLINICAL DATA:  Left hip pain following fall due to seizure activity. EXAM: DG HIP  (WITH OR WITHOUT PELVIS) 1V PORT LEFT COMPARISON:  01/24/2015. FINDINGS: There is no evidence of acute hip fracture or dislocation. A density is noted over the left hip on AP view which is likely external to the patient and not seen on additional view. A density is noted in the soft tissues lateral to the proximal left femoral shaft, which may also be external to the patient. There is no evidence of arthropathy or other focal bone abnormality. IMPRESSION: 1. No acute osseous abnormality. 2. Density overlying the left hip on one view and proximal left thigh on the lateral view may be external to the patient. Clinical correlation is recommended and repeat imaging may be obtained if clinically warranted. Electronically Signed   By: Thornell Sartorius M.D.   On: 03/01/2023 20:25   DG Swallowing Func-Speech Pathology  Result Date: 03/01/2023 Table formatting from the original result was not included. Modified Barium Swallow Study Patient Details Name: BOWEN BONTON MRN: [id number] Date of Birth: 1991-06-20 Today's Date: 03/01/2023 HPI/PMH: HPI: WILLIE MEHAN is a 32 yo female presenting to ED 9/2 from home after a prolonged seizure (lasted  at least 45 minutes continuously). Intubated upon EMS arrival 9/2-9/3, reintubated 9/4-9/6. Cortrak placed 9/4.  CTH 9/2 unremarkable. CXR 9/3 with new increased hazy opacities in lower lung fields and mid perihilar areas. PMH includes asthma, THC and cocaine use, endometriosis, ovarian cyst, seizures Clinical Impression: Clinical Impression: Pt presents with a moderate oropharyngeal dysphagia characterized by impaired timing and sensation. Pt with poor bolus control resulting in posterior progression of less than half of thin liquid boluses to the level of the valleculae before being cued to swallow. This appears to happen frequently even in spontaneous sips with boluses reaching the level of the posterior surface of the epiglottis prior to initiation of laryngeal vestibule  closure. This results in consistent penetration of liquid boluses, with thin liquids and nectar thick liquids initially progressing to the level of the vocal folds without sensation and subsequently resulting in trace silent aspiration (PAS 8) during cued cough attempts. This was improved although not completely resolved by a cued chin tuck, resulting in penetration remaining above the vocal folds (PAS 3) with thin liquids and nectar thick liquids. Question pt's ability to consistently use these compensatory strategies without full supervision to cue due to her impulisivity and cognition. No further penetration/aspiration noted with trials of honey thick liquids, purees, or solids. Pt presents with a strong cough, although her laryngeal sensation appears to be impaired, which may be representative of ETT trauma. Given pt's cognition and respiratory status, recommend initiating diet of Dys 2 textures with honey thick liquids and full supervision to cue pt to clear her throat intermittently. Recommend meds be given crushed in puree. Feel that she shows great potential to upgrade diet given intensive SLP f/u to ensure consistent use of compensatory strategies. Will continue to follow. Factors that may increase risk of adverse event in presence of aspiration Rubye Oaks & Clearance Coots 2021): Factors that may increase risk of adverse event in presence of aspiration Rubye Oaks & Clearance Coots 2021): Presence of tubes (ETT, trach, NG, etc.); Aspiration of thick, dense, and/or acidic materials Recommendations/Plan: Swallowing Evaluation Recommendations Swallowing Evaluation Recommendations Recommendations: PO diet PO Diet Recommendation: Moderately thick liquids (Level 3, honey thick); Dysphagia 2 (Finely chopped) Liquid Administration via: Spoon; Cup Medication Administration: Crushed with puree Supervision: Patient able to self-feed; Full supervision/cueing for swallowing strategies Swallowing strategies  : Minimize environmental  distractions; Slow rate; Small bites/sips; Clear throat intermittently Postural changes: Position pt fully upright for meals; Stay upright 30-60 min after meals Oral care recommendations: Oral care BID (2x/day) Treatment Plan Treatment Plan Treatment recommendations: Therapy as outlined in treatment plan below Follow-up recommendations: Acute inpatient rehab (3 hours/day) Functional status assessment: Patient has had a recent decline in their functional status and demonstrates the ability to make significant improvements in function in a reasonable and predictable amount of time. Treatment frequency: Min 2x/week Treatment duration: 2 weeks Interventions: Aspiration precaution training; Compensatory techniques; Patient/family education; Trials of upgraded texture/liquids; Diet toleration management by SLP Recommendations Recommendations for follow up therapy are one component of a multi-disciplinary discharge planning process, led by the attending physician.  Recommendations may be updated based on patient status, additional functional criteria and insurance authorization. Assessment: Orofacial Exam: Orofacial Exam Oral Cavity: Oral Hygiene: WFL Oral Cavity - Dentition: Adequate natural dentition Orofacial Anatomy: WFL Oral Motor/Sensory Function: WFL Anatomy: Anatomy: WFL Boluses Administered: Boluses Administered Boluses Administered: Thin liquids (Level 0); Mildly thick liquids (Level 2, nectar thick); Moderately thick liquids (Level 3, honey thick); Puree; Solid  Oral Impairment Domain: Oral Impairment Domain Lip Closure: No  labial escape Tongue control during bolus hold: Posterior escape of less than half of bolus Bolus preparation/mastication: Timely and efficient chewing and mashing Bolus transport/lingual motion: Brisk tongue motion Oral residue: Trace residue lining oral structures Location of oral residue : Tongue Initiation of pharyngeal swallow : Posterior laryngeal surface of the epiglottis  Pharyngeal  Impairment Domain: Pharyngeal Impairment Domain Soft palate elevation: No bolus between soft palate (SP)/pharyngeal wall (PW) Laryngeal elevation: Complete superior movement of thyroid cartilage with complete approximation of arytenoids to epiglottic petiole Anterior hyoid excursion: Complete anterior movement Epiglottic movement: Complete inversion Laryngeal vestibule closure: Incomplete, narrow column air/contrast in laryngeal vestibule Pharyngeal stripping wave : Present - complete Pharyngeal contraction (A/P view only): N/A Pharyngoesophageal segment opening: Complete distension and complete duration, no obstruction of flow Tongue base retraction: Trace column of contrast or air between tongue base and PPW Pharyngeal residue: Complete pharyngeal clearance Location of pharyngeal residue: N/A  Esophageal Impairment Domain: Esophageal Impairment Domain Esophageal clearance upright position: Complete clearance, esophageal coating Pill: No data recorded Penetration/Aspiration Scale Score: Penetration/Aspiration Scale Score 1.  Material does not enter airway: Moderately thick liquids (Level 3, honey thick); Puree; Solid 8.  Material enters airway, passes BELOW cords without attempt by patient to eject out (silent aspiration) : Mildly thick liquids (Level 2, nectar thick); Thin liquids (Level 0) Compensatory Strategies: Compensatory Strategies Compensatory strategies: Yes Straw: Ineffective Ineffective Straw: Thin liquid (Level 0); Mildly thick liquid (Level 2, nectar thick) Chin tuck: Effective Effective Chin Tuck: Thin liquid (Level 0); Mildly thick liquid (Level 2, nectar thick)   General Information: Caregiver present: No  Diet Prior to this Study: NPO   Temperature : Normal   Respiratory Status: WFL   Supplemental O2: None (Room air)   History of Recent Intubation: Yes  Behavior/Cognition: Alert; Cooperative; Pleasant mood Self-Feeding Abilities: Able to self-feed Baseline vocal quality/speech: Dysphonic  Volitional Cough: Able to elicit Volitional Swallow: Able to elicit Exam Limitations: Poor positioning Goal Planning: Prognosis for improved oropharyngeal function: Good Barriers to Reach Goals: Cognitive deficits; Time post onset No data recorded Patient/Family Stated Goal: very thirsty Consulted and agree with results and recommendations: Patient Pain: Pain Assessment Pain Assessment: Faces Faces Pain Scale: 0 Facial Expression: 0 Body Movements: 0 Muscle Tension: 0 Compliance with ventilator (intubated pts.): N/A Vocalization (extubated pts.): 0 CPOT Total: 0 Pain Location: generalized Pain Descriptors / Indicators: Discomfort; Grimacing Pain Intervention(s): Monitored during session End of Session: Start Time:SLP Start Time (ACUTE ONLY): 1452 Stop Time: SLP Stop Time (ACUTE ONLY): 1519 Time Calculation:SLP Time Calculation (min) (ACUTE ONLY): 27 min Charges: SLP Evaluations $ SLP Speech Visit: 1 Visit SLP Evaluations $BSS Swallow: 1 Procedure $MBS Swallow: 1 Procedure SLP visit diagnosis: SLP Visit Diagnosis: Dysphagia, oropharyngeal phase (R13.12) Past Medical History: Past Medical History: Diagnosis Date  Asthma   Cocaine use complicating pregnancy in third trimester 08/30/2017  Endometriosis   Ovarian cyst   Seizures (HCC)   Status post cesarean section 08/30/2017 Past Surgical History: Past Surgical History: Procedure Laterality Date  CESAREAN SECTION N/A 08/30/2017  Procedure: CESAREAN SECTION;  Surgeon: Conard Novak, MD;  Location: [facility] ORS;  Service: Obstetrics;  Laterality: N/A;  LASER ABLATION/CAUTERIZATION OF ENDOMETRIAL IMPLANTS   Gwynneth Aliment, M.A., CF-SLP Speech Language Pathology, Acute Rehabilitation Services Secure Chat preferred [phone] 03/01/2023, 4:10 PM       Scheduled Meds:  Chlorhexidine Gluconate Cloth  6 each Topical Daily   docusate  100 mg Per Tube BID   enoxaparin (LOVENOX) injection  40 mg Subcutaneous  Q24H   Gerhardt's butt cream   Topical Daily   lamoTRIgine  150  mg Per Tube BID   levETIRAcetam  1,000 mg Per Tube BID   linezolid  600 mg Oral Q12H   polyethylene glycol  17 g Per Tube Daily   QUEtiapine  100 mg Per Tube QHS   QUEtiapine  50 mg Per Tube Q1400   Continuous Infusions:  sodium chloride     sodium chloride Stopped (02/24/23 1217)     LOS: 8 days     Dorcas Carrow, MD [facility]   Total time spent : 35 minutes

## 2023-03-02 NOTE — PMR Pre-admission (Signed)
PMR Admission Coordinator Pre-Admission Assessment  Patient: Colleen Pearson is an 32 y.o., female MRN: [id number] DOB: May 13, 1991 Height: 5\' 1"  (154.9 cm) Weight: 71.5 kg  Insurance Information HMO:     PPO:      PCP:      IPA:      80/20:      OTHER:  PRIMARY: Medicaid [place] [employer] [place]      Policy#: [id number]      Subscriber: pt CM Name: Faxed approval      Phone#: [phone]     Fax#: [phone] Pre-Cert#: [id number] approved 9/11 until 03/10/23. Update due 9/19 by 5 pm  fax [phone]:  Benefits:  Phone #: [phone]     Name: 9/10 Eff. Date: 05/22/2022 until 07/23/2023     Deduct: none      Out of Pocket Max: none      Life Max: none CIR: per Medicaid      SNF: per medicaid Outpatient: per medicaid     Co-Pay:  Home Health: per medicaid      Co-Pay:  DME: per medicaid     Co-Pay:  Providers: in network  SECONDARY: none       Financial Counselor:       Phone#:   The Data processing manager" for patients in Inpatient Rehabilitation Facilities with attached "Privacy Act Statement-Health Care Records" was provided and verbally reviewed with: N/A  Emergency Contact Information Contact Information   None on File    Other Contacts     Name Relation Home Work Mobile   Wingate,Cecil Father   [phone]   Poet, Linke Mother   [phone]      Current Medical History  Patient Admitting Diagnosis: Seizures, post COVID  History of Present Illness: 32 year old female with a history of epilepsy for which she take Keppra. Last breakthrough seizure 6/24. Presented on 02/22/23 for prolonged seizure. Required multiple doses of midazolam pre hospital. Unresponsive on arrival and intubated.  Neurology consulted. Started on ceftriaxone for possible aspiration. EEG suggesting focal seizure. Extubated on 9/3. Failed extubation and re intubated on 9/4. Found to be COVID positive.  Eventual extubation on 9/6. To continue on Keppra and Lamictal.  Acute delirium initially but now resolving . Weaned off precedex but remains on Seroquel and prn ativan and haldol. Left thigh pain and swelling felt related to traumatic rhabdomyolysis and rupture of the muscled. Negative xray. Tracheal aspirate MRSA positive. Weaned off oxygen. On linezolid to complete 7 days of therapy. ON IV dexamethasone for 7 days of therapy. CK elevated but improving. IVF discontinued. Initially on tube feeds and seen by SLP. Discontinued and now on dysphagia diet. Lovenox for DVT prophylaxis.   Patient's medical record from [facility] has been reviewed by the rehabilitation admission coordinator and physician.  Past Medical History  Past Medical History:  Diagnosis Date   Asthma    Cocaine use complicating pregnancy in third trimester 08/30/2017   Endometriosis    Ovarian cyst    Seizures (HCC)    Status post cesarean section 08/30/2017   Has the patient had major surgery during 100 days prior to admission? No  Family History   family history is not on file.  Current Medications  Current Facility-Administered Medications:    0.9 %  sodium chloride infusion, 250 mL, Intravenous, Continuous, Cheri Fowler, MD, Stopped at 02/24/23 1217   acetaminophen (TYLENOL) 160 MG/5ML solution 650 mg, 650 mg, Per Tube, Q6H PRN, Luciano Cutter, MD, 650 mg at  03/01/23 2306   Chlorhexidine Gluconate Cloth 2 % PADS 6 each, 6 each, Topical, Daily, Icard, Bradley L, DO, 6 each at 03/03/23 0927   docusate (COLACE) 50 MG/5ML liquid 100 mg, 100 mg, Per Tube, BID PRN, Simonne Martinet, NP   docusate (COLACE) 50 MG/5ML liquid 100 mg, 100 mg, Per Tube, BID, Simonne Martinet, NP, 100 mg at 02/28/23 2127   enoxaparin (LOVENOX) injection 40 mg, 40 mg, Subcutaneous, Q24H, Wouk, Wilfred Curtis, MD, 40 mg at 03/03/23 1610   Gerhardt's butt cream, , Topical, Daily, Luciano Cutter, MD, Given at 03/02/23 1024   guaiFENesin-dextromethorphan (ROBITUSSIN DM) 100-10 MG/5ML syrup 10 mL, 10  mL, Oral, Q6H PRN, Dorcas Carrow, MD, 10 mL at 03/02/23 2236   ipratropium-albuterol (DUONEB) 0.5-2.5 (3) MG/3ML nebulizer solution 3 mL, 3 mL, Nebulization, Q6H PRN, Wouk, Wilfred Curtis, MD   lamoTRIgine (LAMICTAL) tablet 150 mg, 150 mg, Per Tube, BID, Milon Dikes, MD, 150 mg at 03/03/23 0926   levETIRAcetam (KEPPRA) tablet 1,000 mg, 1,000 mg, Per Tube, BID, Francena Hanly, RPH, 1,000 mg at 03/03/23 9604   linezolid (ZYVOX) tablet 600 mg, 600 mg, Oral, Q12H, Wouk, Wilfred Curtis, MD, 600 mg at 03/03/23 0926   polyethylene glycol (MIRALAX / GLYCOLAX) packet 17 g, 17 g, Per Tube, Daily PRN, Simonne Martinet, NP   polyethylene glycol (MIRALAX / GLYCOLAX) packet 17 g, 17 g, Per Tube, Daily, Zenia Resides E, NP, 17 g at 02/25/23 0941   QUEtiapine (SEROQUEL) tablet 100 mg, 100 mg, Per Tube, QHS, Luciano Cutter, MD, 100 mg at 03/02/23 2243   QUEtiapine (SEROQUEL) tablet 50 mg, 50 mg, Per Tube, Q1400, Luciano Cutter, MD, 50 mg at 03/03/23 5409  Patients Current Diet:  Diet Order             DIET DYS 2 Room service appropriate? Yes; Fluid consistency: Honey Thick  Diet effective now                  Precautions / Restrictions Precautions Precautions: Fall, Other (comment) Precaution Comments: watch sats and HR Restrictions Weight Bearing Restrictions: No  COVID isolation and MRSA isolation  Has the patient had 2 or more falls or a fall with injury in the past year? No  Prior Activity Level Community (5-7x/wk): works, independent  Prior Functional Level Self Care: Did the patient need help bathing, dressing, using the toilet or eating? Independent  Indoor Mobility: Did the patient need assistance with walking from room to room (with or without device)? Independent  Stairs: Did the patient need assistance with internal or external stairs (with or without device)? Independent  Functional Cognition: Did the patient need help planning regular tasks such as shopping or  remembering to take medications? Independent  Patient Information Are you of Hispanic, Latino/a,or Spanish origin?: A. No, not of Hispanic, Latino/a, or Spanish origin What is your race?: A. White Do you need or want an interpreter to communicate with a doctor or health care staff?: 0. No  Patient's Response To:  Health Literacy and Transportation Is the patient able to respond to health literacy and transportation needs?: Yes Health Literacy - How often do you need to have someone help you when you read instructions, pamphlets, or other written material from your doctor or pharmacy?: Never In the past 12 months, has lack of transportation kept you from medical appointments or from getting medications?: No In the past 12 months, has lack of transportation kept you from meetings, work, or  from getting things needed for daily living?: No  Home Assistive Devices / Equipment Home Equipment: None  Prior Device Use: Indicate devices/aids used by the patient prior to current illness, exacerbation or injury? None of the above  Current Functional Level Cognition  Arousal/Alertness: Awake/alert Overall Cognitive Status: Impaired/Different from baseline Current Attention Level: Focused Orientation Level: Oriented X4 Following Commands: Follows one step commands consistently Safety/Judgement: Decreased awareness of safety, Decreased awareness of deficits General Comments: pt calm and appropriate throughout session today, still with decreased attention to self during mobility though which decreases her safety Attention: Sustained Sustained Attention: Impaired Sustained Attention Impairment: Verbal basic, Functional basic Memory: Appears intact Awareness: Appears intact Problem Solving: Impaired Problem Solving Impairment: Verbal basic, Functional basic    Extremity Assessment (includes Sensation/Coordination)  Upper Extremity Assessment: Right hand dominant, LUE deficits/detail, RUE  deficits/detail RUE Deficits / Details: A/ROM WFL in all ranges. 5/5 shoulder, elbow strength. Functional gross grasp. LUE Deficits / Details: A/ROM WFL in all ranges. 4-/5 strength shoulder flexion/abduction, elbow extension, functional although decreased gross grasp  Lower Extremity Assessment: Generalized weakness, Defer to PT evaluation    ADLs  General ADL Comments: pt requires SBA - Min A to complete Bathing, dressing, and grooming tasks while primarily sitting although able to stand when necessary. Currently utilizing Purewick for toileting although reports that she can tell when she needs to go; she just has been going constantly.    Mobility  Overal bed mobility: Needs Assistance Bed Mobility: Supine to Sit, Sit to Supine Supine to sit: Min assist, Used rails Sit to supine: Supervision General bed mobility comments: Pt reached for OT to assist with trunk, while OT initiated pt to roll, she was then able to do so with less assistance. Supine to sit completed with HOB flat.    Transfers  Overall transfer level: Needs assistance Equipment used: None Transfers: Sit to/from Stand, Bed to chair/wheelchair/BSC Sit to Stand: Supervision Bed to/from chair/wheelchair/BSC transfer type:: Step pivot Stand pivot transfers: Mod assist Step pivot transfers: Contact guard assist General transfer comment: pt stood without physical assist but had 1 LOB when standing from toilet    Ambulation / Gait / Stairs / Wheelchair Mobility  Ambulation/Gait Ambulation/Gait assistance: Min assist, +2 safety/equipment Gait Distance (Feet): 40 Feet (10', 30') Assistive device: Rolling walker (2 wheels) Gait Pattern/deviations: Decreased stride length, Wide base of support, Trunk flexed, Drifts right/left General Gait Details: ambulated into bathroom with HHA, tends to keep neck flexed looking down, vc's to scan forward. Then ambulated 30' with RW and min A. Mildly unsteady with turns and does not attend well  to self or environment Gait velocity: decreased Gait velocity interpretation: 1.31 - 2.62 ft/sec, indicative of limited community ambulator    Posture / Balance Dynamic Sitting Balance Sitting balance - Comments: EOB completing sock management Balance Overall balance assessment: Needs assistance Sitting-balance support: No upper extremity supported, Feet supported Sitting balance-Leahy Scale: Normal Sitting balance - Comments: EOB completing sock management Standing balance support: Bilateral upper extremity supported Standing balance-Leahy Scale: Poor Standing balance comment: Relied on external support when standing. Able to complete partial squat from sitting EOB to reach for cup on tray table that was moved away from bed without LOB.    Special needs/care consideration COVID isolation. Positive on 9/4 Fall transferring to [facility] 9/9 Fall precautions   Previous Home Environment  Living Arrangements:  (Mom, step dad, 5 yo Physicist, medical, and her sister)  Lives With: Family, Daughter Available Help at Discharge:  (Mom confirms  24/7 assist) Type of Home: House Home Layout: Two level, Able to live on main level with bedroom/bathroom Alternate Level Stairs-Number of Steps: flight Home Access: Stairs to enter Entrance Stairs-Rails: Right, Left, Can reach both Entrance Stairs-Number of Steps: 3-4 Bathroom Shower/Tub: Engineer, manufacturing systems: Standard Bathroom Accessibility: Yes How Accessible: Accessible via walker Home Care Services: No Additional Comments: Mom clarified all  Discharge Living Setting Plans for Discharge Living Setting: Patient's home, Lives with (comment) (Mom , stepdad, 5 yo dtr and 78 yo sister) Type of Home at Discharge: House Discharge Home Layout: Two level, Able to live on main level with bedroom/bathroom Alternate Level Stairs-Number of Steps: flight Discharge Home Access: Stairs to enter Entrance Stairs-Rails: Right, Left, Can reach both Entrance  Stairs-Number of Steps: 3-4 Discharge Bathroom Shower/Tub: Tub/shower unit Discharge Bathroom Toilet: Standard Discharge Bathroom Accessibility: Yes How Accessible: Accessible via walker Does the patient have any problems obtaining your medications?: No  Social/Family/Support Systems Patient Roles: Parent Contact Information: Vance Gather Anticipated Caregiver: Mom Anticipated Caregiver's Contact Information: see contacts Ability/Limitations of Caregiver: none Caregiver Availability: 24/7 Discharge Plan Discussed with Primary Caregiver: Yes Is Caregiver In Agreement with Plan?: Yes Does Caregiver/Family have Issues with Lodging/Transportation while Pt is in Rehab?: No  Goals Patient/Family Goal for Rehab: Mod I to superivsion with PT, OT and SLP Expected length of stay: ELOS 10 to 12 days Pt/Family Agrees to Admission and willing to participate: Yes Program Orientation Provided & Reviewed with Pt/Caregiver Including Roles  & Responsibilities: Yes  Decrease burden of Care through IP rehab admission: n/a  Possible need for SNF placement upon discharge: not anticipated  Patient Condition: I have reviewed medical records from [facility], spoken with patient and family member. I met with patient at the bedside and discussed via phone for inpatient rehabilitation assessment.  Patient will benefit from ongoing PT, OT, and SLP, can actively participate in 3 hours of therapy a day 5 days of the week, and can make measurable gains during the admission.  Patient will also benefit from the coordinated team approach during an Inpatient Acute Rehabilitation admission.  The patient will receive intensive therapy as well as Rehabilitation physician, nursing, social worker, and care management interventions.  Due to bladder management, bowel management, safety, skin/wound care, disease management, medication administration, pain management, and patient education the patient requires 24 hour a  day rehabilitation nursing.  The patient is currently min assist overall with mobility and basic ADLs.  Discharge setting and therapy post discharge at home with home health is anticipated.  Patient has agreed to participate in the Acute Inpatient Rehabilitation Program and will admit today.  Preadmission Screen Completed By:  Clois Dupes, RN MSN 03/03/2023 1:51 PM ______________________________________________________________________   Discussed status with Dr. Natale Lay on 03/03/23 at 1352 and received approval for admission today.  Admission Coordinator:  Clois Dupes, RN MSN time [id number] Date 03/03/23   Assessment/Plan: Diagnosis: debility due to Status epilepticus with rhabdomyolysis and COVID Does the need for close, 24 hr/day Medical supervision in concert with the patient's rehab needs make it unreasonable for this patient to be served in a less intensive setting? Yes Co-Morbidities requiring supervision/potential complications: AKI, Rhabdomyolysis, delirum, left thigh pain, Covid pneumonia, aspiration pneumonia, MRSA pneumonia, dysphagia, demand ischemia Due to bladder management, bowel management, safety, skin/wound care, disease management, medication administration, pain management, and patient education, does the patient require 24 hr/day rehab nursing? Yes Does the patient require coordinated care of a physician, rehab nurse, PT, OT,  and SLP to address physical and functional deficits in the context of the above medical diagnosis(es)? Yes Addressing deficits in the following areas: balance, endurance, locomotion, strength, transferring, bowel/bladder control, bathing, dressing, feeding, grooming, toileting, cognition, speech, language, swallowing, and psychosocial support Can the patient actively participate in an intensive therapy program of at least 3 hrs of therapy 5 days a week? Yes The potential for patient to make measurable gains while on inpatient rehab is  excellent Anticipated functional outcomes upon discharge from inpatient rehab: modified independent PT, modified independent OT, modified independent SLP Estimated rehab length of stay to reach the above functional goals is: 10-12 Anticipated discharge destination: Home 10. Overall Rehab/Functional Prognosis: excellent   MD Signature: Fanny Dance

## 2023-03-02 NOTE — Progress Notes (Signed)
  Inpatient Rehabilitation Admissions Coordinator   Met with patient at bedside and spoke with her Mom by phone for rehab assessment. We discussed goals and expectations of a possible CIR admit. They prefer CIR for rehab. Family can provide expected caregiver support that is recommended of supervision level. I have placed order for OT eval and contacted acute therapy dept to request eval so that I can begin Auth for potential Cir admit. Please call me with any questions.   Ottie Glazier, RN, MSN Rehab Admissions Coordinator [phone]

## 2023-03-02 NOTE — Evaluation (Signed)
Occupational Therapy Evaluation Patient Details Name: Colleen Pearson MRN: [id number] DOB: 21-Feb-1991 Today's Date: 03/02/2023   History of Present Illness 32 y.o. female admitted 9/2 with prolonged seizure requiring intubation and Keppra. Intubated (9/2-9/3, 9/4-9/6). 9/4 Covid (+). PMHx: epilepsy   Clinical Impression   Pt admitted with the above diagnosis. Pt currently with functional limitations due to the deficits listed below (see OT Problem List). Prior to admit, pt was living at home with her mother and 64 y.o. daughter independent with ADL tasks and functional mobility. Pt reports that she works at [employer] [place].  Pt will benefit from acute skilled OT to increase their safety and independence with ADL and functional mobility for ADL to facilitate discharge. Patient will benefit from intensive inpatient follow up therapy, >3 hours/day. OT will continue to follow patient acutely.          If plan is discharge home, recommend the following: A little help with walking and/or transfers;A little help with bathing/dressing/bathroom;Help with stairs or ramp for entrance;Assist for transportation;Assistance with cooking/housework;Direct supervision/assist for financial management;Direct supervision/assist for medications management    Functional Status Assessment  Patient has had a recent decline in their functional status and demonstrates the ability to make significant improvements in function in a reasonable and predictable amount of time.  Equipment Recommendations  Other (comment) (defer to next venue of care)    Recommendations for Other Services Rehab consult     Precautions / Restrictions Precautions Precautions: Fall;Other (comment) Precaution Comments: watch sats and HR Restrictions Weight Bearing Restrictions: No      Mobility Bed Mobility Overal bed mobility: Needs Assistance Bed Mobility: Supine to Sit, Sit to Supine     Supine to sit: Min assist, Used  rails Sit to supine: Supervision   General bed mobility comments: Pt reached for OT to assist with trunk, while OT initiated pt to roll, she was then able to do so with less assistance. Supine to sit completed with HOB flat.    Transfers Overall transfer level: Needs assistance Equipment used: None Transfers: Sit to/from Stand, Bed to chair/wheelchair/BSC Sit to Stand: Supervision     Step pivot transfers: Contact guard assist            Balance Overall balance assessment: Needs assistance   Sitting balance-Leahy Scale: Normal Sitting balance - Comments: EOB completing sock management   Standing balance support: Bilateral upper extremity supported Standing balance-Leahy Scale: Poor Standing balance comment: Relied on external support when standing. Able to complete partial squat from sitting EOB to reach for cup on tray table that was moved away from bed without LOB.      ADL either performed or assessed with clinical judgement   ADL           General ADL Comments: pt requires SBA - Min A to complete Bathing, dressing, and grooming tasks while primarily sitting although able to stand when necessary. Currently utilizing Purewick for toileting although reports that she can tell when she needs to go; she just has been going constantly.     Vision Baseline Vision/History: 0 No visual deficits Ability to See in Adequate Light: 0 Adequate Patient Visual Report: No change from baseline Vision Assessment?: No apparent visual deficits            Pertinent Vitals/Pain Pain Assessment Pain Assessment: Faces Faces Pain Scale: Hurts even more Pain Location: left leg when bringing it back onto bed. Pain Descriptors / Indicators: Discomfort, Grimacing Pain Intervention(s): Monitored during session  Extremity/Trunk Assessment Upper Extremity Assessment Upper Extremity Assessment: Right hand dominant;LUE deficits/detail;RUE deficits/detail RUE Deficits / Details: A/ROM  WFL in all ranges. 5/5 shoulder, elbow strength. Functional gross grasp. LUE Deficits / Details: A/ROM WFL in all ranges. 4-/5 strength shoulder flexion/abduction, elbow extension, functional although decreased gross grasp   Lower Extremity Assessment Lower Extremity Assessment: Generalized weakness;Defer to PT evaluation   Cervical / Trunk Assessment Cervical / Trunk Assessment: Normal   Communication Communication Communication: No apparent difficulties   Cognition Arousal: Alert Behavior During Therapy: WFL for tasks assessed/performed Overall Cognitive Status: Impaired/Different from baseline Area of Impairment: Attention, Memory, Following commands, Safety/judgement, Awareness, Problem solving      Orientation Level: Time, Disoriented to Current Attention Level: Focused Memory: Decreased short-term memory Following Commands: Follows one step commands consistently Safety/Judgement: Decreased awareness of safety, Decreased awareness of deficits Awareness:  (Did not mention or indicate that she knew her bed pad was wet. OT noticed during session and changed it.) Problem Solving: Slow processing, Requires verbal cues       General Comments  HR tachy during session. 120's at rest and jumping up to 140's with mobility or coughing. Very deconditioned            Home Living Family/patient expects to be discharged to:: Private residence Living Arrangements: Parent;Children (Lives with Mother and 5 y.o. daughter, [name]) Available Help at Discharge: Family;Available 24 hours/day Type of Home: House Home Access: Stairs to enter [employer] of Steps: 3-4 Entrance Stairs-Rails: Right;Left;Can reach both Home Layout: Two level;Able to live on main level with bedroom/bathroom Alternate Level Stairs-Number of Steps: flight   Bathroom Shower/Tub: Chief Strategy Officer: Standard     Home Equipment: None      Lives With: Family;Daughter    Prior  Functioning/Environment Prior Level of Function : Independent/Modified Independent;Working/employed               ADLs Comments: works for [employer] in        [employer] Problem List: Decreased coordination;Decreased strength;Decreased cognition;Decreased activity Retail buyer;Impaired balance (sitting and/or standing);Decreased knowledge of use of DME or AE      OT Treatment/Interventions: Self-care/ADL training;Therapeutic exercise;Therapeutic activities;Cognitive remediation/compensation;Energy conservation;DME and/or AE instruction;Patient/family education;Manual therapy;Balance training    OT Goals(Current goals can be found in the care plan section) Acute Rehab OT Goals Patient Stated Goal: to go to rehab OT Goal Formulation: Patient unable to participate in goal setting Time For Goal Achievement: 03/16/23 Potential to Achieve Goals: Good  OT Frequency: Min 1X/week       AM-PAC OT "6 Clicks" Daily Activity     Outcome Measure Help from another person eating meals?: None Help from another person taking care of personal grooming?: A Little Help from another person toileting, which includes using toliet, bedpan, or urinal?: Total Help from another person bathing (including washing, rinsing, drying)?: A Lot Help from another person to put on and taking off regular upper body clothing?: A Little Help from another person to put on and taking off regular lower body clothing?: A Lot 6 Click Score: 15   End of Session Nurse Communication: Mobility status  Activity Tolerance: Patient tolerated treatment well Patient left: in bed;with call bell/phone within reach;with bed alarm set  OT Visit Diagnosis: Unsteadiness on feet (R26.81);Muscle weakness (generalized) (M62.81);Other symptoms and signs involving cognitive function                Time: 1357-1425 OT Time Calculation (min): 28 min Charges:  OT  General Charges $OT Visit: 1 Visit OT Evaluation $OT  Eval Moderate Complexity: 1 Mod OT Treatments $Therapeutic Activity: 8-22 mins  Limmie Patricia, OTR/L,CBIS  Supplemental OT - [facility] and [facility] Secure Chat Preferred    Tyanna Hach, Charisse March 03/02/2023, 2:44 PM

## 2023-03-02 NOTE — Progress Notes (Signed)
Speech Language Pathology Treatment: Dysphagia  Patient Details Name: Colleen Pearson MRN: [id number] DOB: 1991-02-25 Today's Date: 03/02/2023 Time: 6301-6010 SLP Time Calculation (min) (ACUTE ONLY): 15 min  Assessment / Plan / Recommendation Clinical Impression  Pt seen for skilled ST treatment targeting swallowing goals. Reviewed all images from MBS with pt and answered all questions to her satisfaction. Observed pt with trials of regular texture solids and honey thick liquids with no overt s/s of dysphagia or aspiration. Provided education regarding use of a chin tuck and intensive swallowing therapy for potential to upgrade diet. Pt then independently used a chin tuck with all subsequent sips independently. Per MBS 9/9, using a chin tuck posture with nectar thick liquids still resulted in penetration that was unable to be expelled by pt's ineffective cough. Feel that she may benefit from continued swallowing therapy to target respiratory hygiene and consistent use of compensatory strategies. Recommend upgrading diet to Dys 3 textures with honey thick liquids. SLP will continue to follow.   HPI HPI: Colleen Pearson is a 33 yo female presenting to ED 9/2 from home after a prolonged seizure (lasted at least 45 minutes continuously). Intubated upon EMS arrival 9/2-9/3, reintubated 9/4-9/6. Cortrak placed 9/4.  CTH 9/2 unremarkable. CXR 9/3 with new increased hazy opacities in lower lung fields and mid perihilar areas. PMH includes asthma, THC and cocaine use, endometriosis, ovarian cyst, seizures      SLP Plan  Continue with current plan of care      Recommendations for follow up therapy are one component of a multi-disciplinary discharge planning process, led by the attending physician.  Recommendations may be updated based on patient status, additional functional criteria and insurance authorization.    Recommendations  Diet recommendations: Dysphagia 3 (mechanical soft);Honey-thick  liquid Liquids provided via: Cup;Straw Medication Administration: Crushed with puree Supervision: Patient able to self feed;Full supervision/cueing for compensatory strategies Compensations: Minimize environmental distractions;Slow rate;Small sips/bites;Chin tuck Postural Changes and/or Swallow Maneuvers: Seated upright 90 degrees                  Oral care BID   Frequent or constant Supervision/Assistance Dysphagia, oropharyngeal phase (R13.12)     Continue with current plan of care    Gwynneth Aliment, M.A., CF-SLP Speech Language Pathology, Acute Rehabilitation Services  Secure Chat preferred [phone]  03/02/2023, 1:38 PM

## 2023-03-02 NOTE — Progress Notes (Signed)
Lower extremity venous left study completed.   Please see CV Proc for preliminary results.   Rachel Hodge, RDMS, RVT  

## 2023-03-02 NOTE — Progress Notes (Signed)
  Echocardiogram 2D Echocardiogram has been performed.  Colleen Pearson 03/02/2023, 10:40 AM

## 2023-03-03 ENCOUNTER — Other Ambulatory Visit: Payer: Self-pay

## 2023-03-03 ENCOUNTER — Encounter (HOSPITAL_COMMUNITY): Payer: Self-pay | Admitting: Physical Medicine and Rehabilitation

## 2023-03-03 ENCOUNTER — Inpatient Hospital Stay (HOSPITAL_COMMUNITY)
Admission: RE | Admit: 2023-03-03 | Discharge: 2023-03-10 | DRG: 945 | Disposition: A | Discharge status: Home / Self Care | Payer: Medicaid Other | Source: Intra-hospital | Attending: Physical Medicine and Rehabilitation | Admitting: Physical Medicine and Rehabilitation

## 2023-03-03 DIAGNOSIS — R7401 Elevation of levels of liver transaminase levels: Secondary | ICD-10-CM | POA: Diagnosis present

## 2023-03-03 DIAGNOSIS — R1310 Dysphagia, unspecified: Secondary | ICD-10-CM | POA: Diagnosis present

## 2023-03-03 DIAGNOSIS — M25552 Pain in left hip: Secondary | ICD-10-CM | POA: Diagnosis not present

## 2023-03-03 DIAGNOSIS — M79652 Pain in left thigh: Secondary | ICD-10-CM

## 2023-03-03 DIAGNOSIS — T796XXD Traumatic ischemia of muscle, subsequent encounter: Secondary | ICD-10-CM

## 2023-03-03 DIAGNOSIS — F419 Anxiety disorder, unspecified: Secondary | ICD-10-CM | POA: Diagnosis present

## 2023-03-03 DIAGNOSIS — M6282 Rhabdomyolysis: Secondary | ICD-10-CM | POA: Diagnosis present

## 2023-03-03 DIAGNOSIS — R410 Disorientation, unspecified: Secondary | ICD-10-CM

## 2023-03-03 DIAGNOSIS — Z781 Physical restraint status: Secondary | ICD-10-CM

## 2023-03-03 DIAGNOSIS — F1910 Other psychoactive substance abuse, uncomplicated: Secondary | ICD-10-CM | POA: Diagnosis present

## 2023-03-03 DIAGNOSIS — G40901 Epilepsy, unspecified, not intractable, with status epilepticus: Secondary | ICD-10-CM | POA: Diagnosis present

## 2023-03-03 DIAGNOSIS — R569 Unspecified convulsions: Secondary | ICD-10-CM

## 2023-03-03 DIAGNOSIS — D72829 Elevated white blood cell count, unspecified: Secondary | ICD-10-CM | POA: Diagnosis not present

## 2023-03-03 DIAGNOSIS — J690 Pneumonitis due to inhalation of food and vomit: Secondary | ICD-10-CM | POA: Diagnosis present

## 2023-03-03 DIAGNOSIS — F17210 Nicotine dependence, cigarettes, uncomplicated: Secondary | ICD-10-CM | POA: Diagnosis present

## 2023-03-03 DIAGNOSIS — G9341 Metabolic encephalopathy: Secondary | ICD-10-CM | POA: Diagnosis present

## 2023-03-03 DIAGNOSIS — Z79899 Other long term (current) drug therapy: Secondary | ICD-10-CM

## 2023-03-03 DIAGNOSIS — D75839 Thrombocytosis, unspecified: Secondary | ICD-10-CM | POA: Diagnosis present

## 2023-03-03 DIAGNOSIS — R5381 Other malaise: Principal | ICD-10-CM | POA: Diagnosis present

## 2023-03-03 DIAGNOSIS — R000 Tachycardia, unspecified: Secondary | ICD-10-CM | POA: Diagnosis not present

## 2023-03-03 LAB — BASIC METABOLIC PANEL
Anion gap: 11 (ref 5–15)
BUN: 19 mg/dL (ref 6–20)
CO2: 19 mmol/L — ABNORMAL LOW (ref 22–32)
Calcium: 8.4 mg/dL — ABNORMAL LOW (ref 8.9–10.3)
Chloride: 105 mmol/L (ref 98–111)
Creatinine, Ser: 0.85 mg/dL (ref 0.44–1.00)
GFR, Estimated: >60 mL/min (ref >60)
Glucose, Bld: 119 mg/dL — ABNORMAL HIGH (ref 70–99)
Potassium: 4 mmol/L (ref 3.5–5.1)
Sodium: 135 mmol/L (ref 135–145)

## 2023-03-03 LAB — CK: Total CK: 2690 U/L — ABNORMAL HIGH (ref 38–234)

## 2023-03-03 MED ORDER — POLYETHYLENE GLYCOL 3350 17 G PO PACK
17.0000 g | PACK | Freq: Every day | ORAL | Status: DC
  Filled 2023-03-03 (×3): qty 1

## 2023-03-03 MED ORDER — BISACODYL 10 MG RE SUPP: 10.0000 mg | Freq: Every day | RECTAL | Status: DC | PRN

## 2023-03-03 MED ORDER — GUAIFENESIN-DM 100-10 MG/5ML PO SYRP
10.0000 mL | ORAL_SOLUTION | Freq: Four times a day (QID) | ORAL | Status: DC | PRN
  Administered 2023-03-05: 10 mL via ORAL
  Filled 2023-03-03: qty 10

## 2023-03-03 MED ORDER — ACETAMINOPHEN 325 MG PO TABS
325.0000 mg | ORAL_TABLET | ORAL | Status: DC | PRN
  Administered 2023-03-03: 650 mg via ORAL
  Filled 2023-03-03 (×2): qty 2

## 2023-03-03 MED ORDER — IPRATROPIUM-ALBUTEROL 0.5-2.5 (3) MG/3ML IN SOLN: 3.0000 mL | Freq: Four times a day (QID) | RESPIRATORY_TRACT | Status: DC | PRN

## 2023-03-03 MED ORDER — LAMOTRIGINE 100 MG PO TABS
150.0000 mg | ORAL_TABLET | Freq: Two times a day (BID) | ORAL | Status: DC
  Administered 2023-03-03 – 2023-03-06 (×6): 150 mg
  Filled 2023-03-03 (×6): qty 2

## 2023-03-03 MED ORDER — LINEZOLID 600 MG PO TABS
600.0000 mg | ORAL_TABLET | Freq: Two times a day (BID) | ORAL | Status: AC
  Administered 2023-03-03: 600 mg via ORAL
  Filled 2023-03-03: qty 1

## 2023-03-03 MED ORDER — QUETIAPINE FUMARATE 50 MG PO TABS: 50.0000 mg | ORAL_TABLET | Freq: Every day | ORAL | Status: DC

## 2023-03-03 MED ORDER — GUAIFENESIN-DM 100-10 MG/5ML PO SYRP: 5.0000 mL | ORAL_SOLUTION | Freq: Four times a day (QID) | ORAL | Status: DC | PRN

## 2023-03-03 MED ORDER — MELATONIN 5 MG PO TABS: 5.0000 mg | ORAL_TABLET | Freq: Every evening | ORAL | Status: DC | PRN

## 2023-03-03 MED ORDER — ALUM & MAG HYDROXIDE-SIMETH 200-200-20 MG/5ML PO SUSP: 30.0000 mL | ORAL | Status: DC | PRN

## 2023-03-03 MED ORDER — GUAIFENESIN-DM 100-10 MG/5ML PO SYRP: 10.0000 mL | ORAL_SOLUTION | Freq: Four times a day (QID) | ORAL | Status: DC | PRN

## 2023-03-03 MED ORDER — FLEET ENEMA RE ENEM: 1.0000 | ENEMA | Freq: Once | RECTAL | Status: DC | PRN

## 2023-03-03 MED ORDER — LEVETIRACETAM 500 MG PO TABS
1000.0000 mg | ORAL_TABLET | Freq: Two times a day (BID) | ORAL | Status: DC
  Administered 2023-03-03 – 2023-03-06 (×6): 1000 mg
  Filled 2023-03-03 (×7): qty 2

## 2023-03-03 MED ORDER — QUETIAPINE FUMARATE 50 MG PO TABS
50.0000 mg | ORAL_TABLET | Freq: Every day | ORAL | Status: DC
  Administered 2023-03-04 – 2023-03-06 (×3): 50 mg
  Filled 2023-03-03 (×3): qty 1

## 2023-03-03 MED ORDER — DIPHENHYDRAMINE HCL 25 MG PO CAPS: 25.0000 mg | ORAL_CAPSULE | Freq: Four times a day (QID) | ORAL | Status: DC | PRN

## 2023-03-03 MED ORDER — PROCHLORPERAZINE EDISYLATE 10 MG/2ML IJ SOLN: 5.0000 mg | Freq: Four times a day (QID) | INTRAMUSCULAR | Status: DC | PRN

## 2023-03-03 MED ORDER — CHLORHEXIDINE GLUCONATE CLOTH 2 % EX PADS
6.0000 | MEDICATED_PAD | Freq: Every day | CUTANEOUS | Status: DC
  Administered 2023-03-04 – 2023-03-06 (×3): 6 via TOPICAL

## 2023-03-03 MED ORDER — ENOXAPARIN SODIUM 40 MG/0.4ML IJ SOSY
40.0000 mg | PREFILLED_SYRINGE | INTRAMUSCULAR | Status: DC
  Administered 2023-03-04 – 2023-03-10 (×7): 40 mg via SUBCUTANEOUS
  Filled 2023-03-03 (×7): qty 0.4

## 2023-03-03 MED ORDER — QUETIAPINE FUMARATE 50 MG PO TABS
100.0000 mg | ORAL_TABLET | Freq: Every day | ORAL | Status: DC
  Administered 2023-03-03 – 2023-03-05 (×3): 100 mg
  Filled 2023-03-03 (×3): qty 2

## 2023-03-03 MED ORDER — PROCHLORPERAZINE 25 MG RE SUPP: 12.5000 mg | Freq: Four times a day (QID) | RECTAL | Status: DC | PRN

## 2023-03-03 MED ORDER — PROCHLORPERAZINE MALEATE 5 MG PO TABS: 5.0000 mg | ORAL_TABLET | Freq: Four times a day (QID) | ORAL | Status: DC | PRN

## 2023-03-03 MED ORDER — GERHARDT'S BUTT CREAM
TOPICAL_CREAM | Freq: Every day | CUTANEOUS | Status: DC
  Filled 2023-03-03: qty 1

## 2023-03-03 NOTE — H&P (Signed)
Physical Medicine and Rehabilitation Admission H&P        Chief Complaint  Patient presents with   Functional decline due to medical issues      HPI: Colleen Pearson is a 32 year old female with history of asthma, polysubstance abuse, seizures on Keppra with last breakthrough seizure 06/24 who was admitted on 02/22/23 with status epilepticus and seizure lasting >45 minutes. She required multiple doses of midazolam, intubated, unresponsive and loaded with IV Keppra and intermittent hypotension treated with 4 L fluids. UDS positive for benzos and THC. CT head negative. Bedside EEG which was negative for seizures and was found to have metabolic acidosis with bicarb <7, SCr 1.74, LA> 15, CK- 47,714 and WBC-18.  She was started on Ceftriaxone for possible aspiration. She developed fever T 102 with posturing on 09/02 and CTA head/chest was negative for stenosis, occlusions but showed abnormal upper lung thickening with confluent airspace opacity L>R  with question of aspiration.  Rapid EEG showed concern of focal seizure in right temporal region. Lamictal resumed and loaded with phenobarb which was weaned off by 09/07.   AKI with severe with rhabdomyolysis treated with bicarb drip.    LT-EEG showed severe diffuse encephalopathy, sedation weaned off and she was briefly extubated on 09/03. She had sudden decline with hypercapnic respiratory failure early am of 09/04 requiring reintubation and was found to be Covid positive. She underwent bronchoscopy revealing tenacious secretions throughout airway and underwent therapeutic aspiration in RLL/LLL. Antibiotics broadened to Vanc/Zosyn. She was extubated to HFNC 15 L by 09/06 and required 4 point restraints as well as precedex due to agitation and hallucinations. Fluid overload treated with IV diuresis and BIPAP prn. As mentation improved, she was weaned off precedex.  She was on tube feeding and this has been discontinued.  She had a MBS performed with  recommendations of D2, Honey liquids due to silent aspiration and impulsivity.      Breakthrough seizure felt to be caused by [facility] use and seizure threshold lowered by Covid. Left thigh swelling felt to be due to traumatic rhabdomyolysis/muscle rupture and BLE duplex 09/10 were negative for DVT. Elevated troponins discussed with Dr. Elease Hashimoto who felt to be due to status, AKI v/s rhabdom as  2D echo  revealed EF 50-55% and no wall abnormality. Respiratory status stable and has been weaned to RA with intermittent tachycardia noted. Therapy has been working with patient who is limited by delay in processing with deconditioning --HR 120-140 w/activity,  cognitive deficits, weakness,      Review of Systems  Constitutional:  Negative for chills and fever.  HENT:  Positive for congestion. Negative for hearing loss.   Eyes:  Negative for blurred vision and double vision.  Respiratory:  Positive for cough and shortness of breath.   Cardiovascular:  Positive for chest pain.  Gastrointestinal:  Negative for abdominal pain, constipation, diarrhea, nausea and vomiting.  Genitourinary: Negative.   Musculoskeletal:  Positive for joint pain, myalgias and neck pain. Negative for back pain.  Skin:  Negative for rash.  Neurological:  Positive for focal weakness and weakness. Negative for sensory change and headaches.            Past Medical History:  Diagnosis Date   Asthma     Cocaine use complicating pregnancy in third trimester 08/30/2017   Endometriosis     Ovarian cyst     Seizures (HCC)     Status post cesarean section 08/30/2017  Past Surgical History:  Procedure Laterality Date   CESAREAN SECTION N/A 08/30/2017    Procedure: CESAREAN SECTION;  Surgeon: Conard Novak, MD;  Location: [facility] ORS;  Service: Obstetrics;  Laterality: N/A;   LASER ABLATION/CAUTERIZATION OF ENDOMETRIAL IMPLANTS              No family history on file.       Social History:  Lives with parents  and 5 year daughter. 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 She  reports that she has been smoking cigarettes and e-cigarettes. She has never used smokeless tobacco. She reports that she does not currently use drugs after having used the following drugs: Marijuana and Cocaine. She reports that she does not drink alcohol.     Allergies      Allergies  Allergen Reactions   Tramadol Hives and Swelling            Medications Prior to Admission  Medication Sig Dispense Refill   etonogestrel (NEXPLANON) 68 MG IMPL implant 1 each (68 mg total) by Subdermal route once for 1 dose. 1 each 0   lamoTRIgine (LAMICTAL) 150 MG tablet Take 150 mg by mouth 2 (two) times daily.       levETIRAcetam (KEPPRA) 1000 MG tablet Take 1 tablet (1,000 mg total) by mouth 2 (two) times daily. 60 tablet 1   levocetirizine (XYZAL) 5 MG tablet Take 5 mg by mouth at bedtime as needed for allergies.                  Home: Home Living Family/patient expects to be discharged to:: Private residence Living Arrangements:  (Mom, step dad, 5 yo Physicist, medical, and her sister) Available Help at Discharge:  (Mom confirms 24/7 assist) Type of Home: House Home Access: Stairs to enter Secretary/administrator of Steps: 3-4 Entrance Stairs-Rails: Right, Left, Can reach both Home Layout: Two level, Able to live on main level with bedroom/bathroom Alternate Level Stairs-Number of Steps: flight Bathroom Shower/Tub: Armed forces operational officer Accessibility: Yes Home Equipment: None Additional Comments: Mom clarified all  Lives With: Family, Daughter   Functional History: Prior Function Prior Level of Function : Independent/Modified Independent, Working/employed ADLs Comments: works for [employer] in   Functional Status:  Mobility: Bed Mobility Overal bed mobility: Needs Assistance Bed Mobility: Supine to Sit, Sit to Supine Supine to sit: Min assist, Used rails Sit to supine: Supervision General bed mobility  comments: Pt reached for OT to assist with trunk, while OT initiated pt to roll, she was then able to do so with less assistance. Supine to sit completed with HOB flat. Transfers Overall transfer level: Needs assistance Equipment used: None Transfers: Sit to/from Stand, Bed to chair/wheelchair/BSC Sit to Stand: Supervision Bed to/from chair/wheelchair/BSC transfer type:: Step pivot Stand pivot transfers: Mod assist Step pivot transfers: Contact guard assist General transfer comment: pt stood without physical assist but had 1 LOB when standing from toilet Ambulation/Gait Ambulation/Gait assistance: Min assist, +2 safety/equipment Gait Distance (Feet): 40 Feet (10', 30') Assistive device: Rolling walker (2 wheels) Gait Pattern/deviations: Decreased stride length, Wide base of support, Trunk flexed, Drifts right/left General Gait Details: ambulated into bathroom with HHA, tends to keep neck flexed looking down, vc's to scan forward. Then ambulated 30' with RW and min A. Mildly unsteady with turns and does not attend well to self or environment Gait velocity: decreased Gait velocity interpretation: 1.31 - 2.62 ft/sec, indicative of limited community ambulator   ADL: ADL General ADL Comments: pt requires SBA -  Min A to complete Bathing, dressing, and grooming tasks while primarily sitting although able to stand when necessary. Currently utilizing Purewick for toileting although reports that she can tell when she needs to go; she just has been going constantly.   Cognition: Cognition Overall Cognitive Status: Impaired/Different from baseline Arousal/Alertness: Awake/alert Orientation Level: Oriented X4 Attention: Sustained Sustained Attention: Impaired Sustained Attention Impairment: Verbal basic, Functional basic Memory: Appears intact Awareness: Appears intact Problem Solving: Impaired Problem Solving Impairment: Verbal basic, Functional basic Cognition Arousal: Alert Behavior During  Therapy: WFL for tasks assessed/performed Overall Cognitive Status: Impaired/Different from baseline Area of Impairment: Attention, Memory, Following commands, Safety/judgement, Awareness, Problem solving Orientation Level: Time, Disoriented to Current Attention Level: Focused Memory: Decreased short-term memory Following Commands: Follows one step commands consistently Safety/Judgement: Decreased awareness of safety, Decreased awareness of deficits Awareness:  (Did not mention or indicate that she knew her bed pad was wet. OT noticed during session and changed it.) Problem Solving: Slow processing, Requires verbal cues General Comments: pt calm and appropriate throughout session today, still with decreased attention to self during mobility though which decreases her safety     Blood pressure (!) 106/51, pulse 90, temperature 97.6 F (36.4 C), temperature source Tympanic, resp. rate 18, height 5\' 1"  (1.549 m), weight 71.5 kg, SpO2 98%. Physical Exam   General: No acute distress HEENT: Head is normocephalic, atraumatic, PERRLA, EOMI, sclera anicteric, oral mucosa pink and moist Neck: Supple without JVD or lymphadenopathy Heart: Reg rate and rhythm. No murmurs rubs or gallops Chest: CTA bilaterally, occasional cough  Abdomen: Soft, non-tender, non-distended, bowel sounds positive. Extremities: left tight swelling  Psych: Pt's affect is appropriate. Pt is cooperative Skin: Clean and intact without signs of breakdown, small bruises noted in bilateral upper extremities Neuro:  Alert and oriented x4, follows commands, CN 2-12 intact, no dysarthria, soft voice, fair memory RUE: 5/5 Deltoid, 5/5 Biceps, 5/5 Triceps, 5/5 Wrist Ext, 5/5 Grip LUE: 5/5 Deltoid, 5/5 Biceps, 5/5 Triceps, 5/5 Wrist Ext, 5/5 Grip RLE: HF 5/5, KE 5/5,  ADF 5/5, APF 5/5 LLE: HF 2/5, KE 4-/5, ADF 5/5, APF 5/5 Sensory exam normal for light touch and pain in all 4 limbs. No limb ataxia or cerebellar signs. No abnormal tone  appreciated.    Musculoskeletal: mild C spine paraspinal tenderness, L thigh with tenderness and pain with PROM, mild R wrist tenderness RUE IV- looks ok     Lab Results Last 48 Hours        Results for orders placed or performed during the hospital encounter of 02/22/23 (from the past 48 hour(s))  Glucose, capillary     Status: Abnormal    Collection Time: 03/01/23  1:44 PM  Result Value Ref Range    Glucose-Capillary 142 (H) 70 - 99 mg/dL      Comment: Glucose reference range applies only to samples taken after fasting for at least 8 hours.  Troponin I (High Sensitivity)     Status: Abnormal    Collection Time: 03/01/23  4:04 PM  Result Value Ref Range    Troponin I (High Sensitivity) 485 (HH) <18 ng/L      Comment: CRITICAL VALUE NOTED. VALUE IS CONSISTENT WITH PREVIOUSLY REPORTED/CALLED VALUE (NOTE) Elevated high sensitivity troponin I (hsTnI) values and significant  changes across serial measurements may suggest ACS but many other  chronic and acute conditions are known to elevate hsTnI results.  Refer to the "Links" section for chest pain algorithms and additional  guidance. Performed at [facility]  Lab, [address], [place], [place] [address]    Vancomycin, random     Status: None    Collection Time: 03/01/23  4:04 PM  Result Value Ref Range    Vancomycin Rm 5 ug/mL      Comment:        Random Vancomycin therapeutic range is dependent on dosage and time of specimen collection. A peak range is 20-40 ug/mL A trough range is 5-15 ug/mL        Performed at [facility], [address] [place], [place] [address]    Glucose, capillary     Status: None    Collection Time: 03/01/23  4:21 PM  Result Value Ref Range    Glucose-Capillary 88 70 - 99 mg/dL      Comment: Glucose reference range applies only to samples taken after fasting for at least 8 hours.  Glucose, capillary     Status: Abnormal    Collection Time: 03/01/23  8:44 PM  Result Value Ref Range     Glucose-Capillary 107 (H) 70 - 99 mg/dL      Comment: Glucose reference range applies only to samples taken after fasting for at least 8 hours.  Glucose, capillary     Status: Abnormal    Collection Time: 03/01/23 11:09 PM  Result Value Ref Range    Glucose-Capillary 147 (H) 70 - 99 mg/dL      Comment: Glucose reference range applies only to samples taken after fasting for at least 8 hours.  Glucose, capillary     Status: Abnormal    Collection Time: 03/02/23  4:11 AM  Result Value Ref Range    Glucose-Capillary 115 (H) 70 - 99 mg/dL      Comment: Glucose reference range applies only to samples taken after fasting for at least 8 hours.  Glucose, capillary     Status: Abnormal    Collection Time: 03/02/23  7:51 AM  Result Value Ref Range    Glucose-Capillary 146 (H) 70 - 99 mg/dL      Comment: Glucose reference range applies only to samples taken after fasting for at least 8 hours.  Basic metabolic panel     Status: Abnormal    Collection Time: 03/02/23  8:07 AM  Result Value Ref Range    Sodium 140 135 - 145 mmol/L    Potassium 4.0 3.5 - 5.1 mmol/L    Chloride 102 98 - 111 mmol/L    CO2 21 (L) 22 - 32 mmol/L    Glucose, Bld 144 (H) 70 - 99 mg/dL      Comment: Glucose reference range applies only to samples taken after fasting for at least 8 hours.    BUN 14 6 - 20 mg/dL    Creatinine, Ser 2.84 0.44 - 1.00 mg/dL    Calcium 9.2 8.9 - 13.2 mg/dL    GFR, Estimated >44 >01 mL/min      Comment: (NOTE) Calculated using the CKD-EPI Creatinine Equation (2021)      Anion gap 17 (H) 5 - 15      Comment: Performed at [facility] Lab, [address] [place], [place] [address]  CK     Status: Abnormal    Collection Time: 03/02/23  8:07 AM  Result Value Ref Range    Total CK 5,552 (H) 38 - 234 U/L      Comment: RESULT CONFIRMED BY MANUAL DILUTION Performed at [facility], [address], [place], [place] [address]  Glucose, capillary     Status: Abnormal    Collection Time:  03/02/23 11:37 AM  Result Value Ref Range    Glucose-Capillary 142 (H) 70 - 99 mg/dL      Comment: Glucose reference range applies only to samples taken after fasting for at least 8 hours.       Imaging Results (Last 48 hours)  VAS [place] LOWER EXTREMITY VENOUS (DVT)   Result Date: 03/02/2023  Lower Venous DVT Study Patient Name:  Colleen Pearson [facility]  Date of Exam:   03/02/2023 Medical Rec #: [id number]           Accession #:    [id number] Date of Birth: 1991-01-04           Patient Gender: F Patient Age:   42 years Exam Location:  [facility] Procedure:      VAS [place] LOWER EXTREMITY VENOUS (DVT) Referring Phys: Shonna Chock --------------------------------------------------------------------------------  Indications: Left thigh swelling in setting of rhabdo. Other Indications: Covid-19. Comparison Study: No prior studies. Performing Technologist: Jean Rosenthal RDMS, RVT  Examination Guidelines: A complete evaluation includes B-mode imaging, spectral Doppler, color Doppler, and power Doppler as needed of all accessible portions of each vessel. Bilateral testing is considered an integral part of a complete examination. Limited examinations for reoccurring indications may be performed as noted. The reflux portion of the exam is performed with the patient in reverse Trendelenburg.  +-----+---------------+---------+-----------+----------+--------------+ RIGHTCompressibilityPhasicitySpontaneityPropertiesThrombus Aging +-----+---------------+---------+-----------+----------+--------------+ CFV  Full           Yes      Yes                                 +-----+---------------+---------+-----------+----------+--------------+   +---------+---------------+---------+-----------+----------+--------------+ LEFT     CompressibilityPhasicitySpontaneityPropertiesThrombus Aging +---------+---------------+---------+-----------+----------+--------------+ CFV      Full           Yes      Yes                                  +---------+---------------+---------+-----------+----------+--------------+ SFJ      Full                                                        +---------+---------------+---------+-----------+----------+--------------+ FV Prox  Full                                                        +---------+---------------+---------+-----------+----------+--------------+ FV Mid   Full                                                        +---------+---------------+---------+-----------+----------+--------------+ FV DistalFull           Yes      Yes                                 +---------+---------------+---------+-----------+----------+--------------+  PFV      Full                                                        +---------+---------------+---------+-----------+----------+--------------+ POP      Full           Yes      Yes                                 +---------+---------------+---------+-----------+----------+--------------+ PTV      Full                                                        +---------+---------------+---------+-----------+----------+--------------+ PERO     Full                                                        +---------+---------------+---------+-----------+----------+--------------+     Summary: RIGHT: - No evidence of common femoral vein obstruction.  LEFT: - There is no evidence of deep vein thrombosis in the lower extremity.  - No cystic structure found in the popliteal fossa.  *See table(s) above for measurements and observations. Electronically signed by Lemar Livings MD on 03/02/2023 at 5:40:15 PM.    Final     ECHOCARDIOGRAM COMPLETE   Result Date: 03/02/2023    ECHOCARDIOGRAM REPORT   Patient Name:   Colleen Pearson [facility] Date of Exam: 03/02/2023 Medical Rec #:  [id number]          Height:       61.0 in Accession #:    [id number]         Weight:       157.6 lb Date of Birth:  05-08-1991          BSA:           1.707 m Patient Age:    32 years           BP:           130/82 mmHg Patient Gender: F                  HR:           98 bpm. Exam Location:  Inpatient Procedure: 2D Echo, Color Doppler and Cardiac Doppler Indications:    Elevated Troponin  History:        Patient has no prior history of Echocardiogram examinations.                 Seizure; Risk Factors:Current Smoker. Substance Abuse.  Sonographer:    Aron Baba Referring Phys: [id number] NOAH BEDFORD ZHYQ  Sonographer Comments: Image acquisition challenging due to respiratory motion. IMPRESSIONS  1. Left ventricular ejection fraction, by estimation, is 50 to 55%. The left ventricle has low normal function. The left ventricle has no regional wall motion abnormalities. Left ventricular diastolic parameters were normal.  2. Right ventricular systolic function is normal. The right ventricular size is  normal. There is normal pulmonary artery systolic pressure. The estimated right ventricular systolic pressure is 11.5 mmHg.  3. The mitral valve is grossly normal. Trivial mitral valve regurgitation. No evidence of mitral stenosis.  4. The aortic valve is grossly normal. Aortic valve regurgitation is not visualized. No aortic stenosis is present.  5. The inferior vena cava is normal in size with greater than 50% respiratory variability, suggesting right atrial pressure of 3 mmHg. FINDINGS  Left Ventricle: Left ventricular ejection fraction, by estimation, is 50 to 55%. The left ventricle has low normal function. The left ventricle has no regional wall motion abnormalities. The left ventricular internal cavity size was normal in size. There is no left ventricular hypertrophy. Left ventricular diastolic parameters were normal. Right Ventricle: The right ventricular size is normal. No increase in right ventricular wall thickness. Right ventricular systolic function is normal. There is normal pulmonary artery systolic pressure. The tricuspid regurgitant velocity is 1.46  m/s, and  with an assumed right atrial pressure of 3 mmHg, the estimated right ventricular systolic pressure is 11.5 mmHg. Left Atrium: Left atrial size was normal in size. Right Atrium: Right atrial size was normal in size. Pericardium: There is no evidence of pericardial effusion. Mitral Valve: The mitral valve is grossly normal. Trivial mitral valve regurgitation. No evidence of mitral valve stenosis. Tricuspid Valve: The tricuspid valve is grossly normal. Tricuspid valve regurgitation is trivial. No evidence of tricuspid stenosis. Aortic Valve: The aortic valve is grossly normal. Aortic valve regurgitation is not visualized. No aortic stenosis is present. Pulmonic Valve: The pulmonic valve was grossly normal. Pulmonic valve regurgitation is not visualized. No evidence of pulmonic stenosis. Aorta: The aortic root and ascending aorta are structurally normal, with no evidence of dilitation. Venous: The inferior vena cava is normal in size with greater than 50% respiratory variability, suggesting right atrial pressure of 3 mmHg. IAS/Shunts: The atrial septum is grossly normal.  LEFT VENTRICLE PLAX 2D LVIDd:         4.00 cm   Diastology LVIDs:         3.00 cm   LV e' medial:    8.05 cm/s LV PW:         0.90 cm   LV E/e' medial:  13.3 LV IVS:        0.70 cm   LV e' lateral:   11.00 cm/s LVOT diam:     1.40 cm   LV E/e' lateral: 9.7 LV SV:         29 LV SV Index:   17 LVOT Area:     1.54 cm  RIGHT VENTRICLE RV S prime:     16.30 cm/s TAPSE (M-mode): 1.5 cm LEFT ATRIUM             Index        RIGHT ATRIUM           Index LA diam:        2.60 cm 1.52 cm/m   RA Area:     10.10 cm LA Vol (A2C):   25.5 ml 14.94 ml/m  RA Volume:   19.30 ml  11.31 ml/m LA Vol (A4C):   22.9 ml 13.42 ml/m LA Biplane Vol: 24.9 ml 14.59 ml/m  AORTIC VALVE LVOT Vmax:   130.00 cm/s LVOT Vmean:  83.200 cm/s LVOT VTI:    0.186 m  AORTA Ao Root diam: 2.60 cm Ao Asc diam:  2.50 cm MITRAL VALVE  TRICUSPID VALVE MV Area (PHT): 5.42  cm     TR Peak grad:   8.5 mmHg MV Decel Time: 140 msec     TR Vmax:        146.00 cm/s MR Peak grad: 7.1 mmHg MR Vmax:      133.00 cm/s   SHUNTS MV E velocity: 107.00 cm/s  Systemic VTI:  0.19 m MV A velocity: 75.30 cm/s   Systemic Diam: 1.40 cm MV E/A ratio:  1.42 Lennie Odor MD Electronically signed by Lennie Odor MD Signature Date/Time: 03/02/2023/11:15:54 AM    Final     DG HIP PORT UNILAT WITH PELVIS 1V LEFT   Result Date: 03/01/2023 CLINICAL DATA:  Left hip pain following fall due to seizure activity. EXAM: DG HIP (WITH OR WITHOUT PELVIS) 1V PORT LEFT COMPARISON:  01/24/2015. FINDINGS: There is no evidence of acute hip fracture or dislocation. A density is noted over the left hip on AP view which is likely external to the patient and not seen on additional view. A density is noted in the soft tissues lateral to the proximal left femoral shaft, which may also be external to the patient. There is no evidence of arthropathy or other focal bone abnormality. IMPRESSION: 1. No acute osseous abnormality. 2. Density overlying the left hip on one view and proximal left thigh on the lateral view may be external to the patient. Clinical correlation is recommended and repeat imaging may be obtained if clinically warranted. Electronically Signed   By: Thornell Sartorius M.D.   On: 03/01/2023 20:25    DG Swallowing Func-Speech Pathology   Result Date: 03/01/2023 Table formatting from the original result was not included. Modified Barium Swallow Study Patient Details Name: Colleen Pearson MRN: [id number] Date of Birth: [birth year] Today's Date: 03/01/2023 HPI/PMH: HPI: KAMAILE BISSEY is a 32 yo female presenting to ED 9/2 from home after a prolonged seizure (lasted at least 45 minutes continuously). Intubated upon EMS arrival 9/2-9/3, reintubated 9/4-9/6. Cortrak placed 9/4.  CTH 9/2 unremarkable. CXR 9/3 with new increased hazy opacities in lower lung fields and mid perihilar areas. PMH includes asthma, THC and  cocaine use, endometriosis, ovarian cyst, seizures Clinical Impression: Clinical Impression: Pt presents with a moderate oropharyngeal dysphagia characterized by impaired timing and sensation. Pt with poor bolus control resulting in posterior progression of less than half of thin liquid boluses to the level of the valleculae before being cued to swallow. This appears to happen frequently even in spontaneous sips with boluses reaching the level of the posterior surface of the epiglottis prior to initiation of laryngeal vestibule closure. This results in consistent penetration of liquid boluses, with thin liquids and nectar thick liquids initially progressing to the level of the vocal folds without sensation and subsequently resulting in trace silent aspiration (PAS 8) during cued cough attempts. This was improved although not completely resolved by a cued chin tuck, resulting in penetration remaining above the vocal folds (PAS 3) with thin liquids and nectar thick liquids. Question pt's ability to consistently use these compensatory strategies without full supervision to cue due to her impulisivity and cognition. No further penetration/aspiration noted with trials of honey thick liquids, purees, or solids. Pt presents with a strong cough, although her laryngeal sensation appears to be impaired, which may be representative of ETT trauma. Given pt's cognition and respiratory status, recommend initiating diet of Dys 2 textures with honey thick liquids and full supervision to cue pt to clear her throat intermittently. Recommend meds be  given crushed in puree. Feel that she shows great potential to upgrade diet given intensive SLP f/u to ensure consistent use of compensatory strategies. Will continue to follow. Factors that may increase risk of adverse event in presence of aspiration Rubye Oaks & Clearance Coots 2021): Factors that may increase risk of adverse event in presence of aspiration Rubye Oaks & Clearance Coots 2021): Presence of  tubes (ETT, trach, NG, etc.); Aspiration of thick, dense, and/or acidic materials Recommendations/Plan: Swallowing Evaluation Recommendations Swallowing Evaluation Recommendations Recommendations: PO diet PO Diet Recommendation: Moderately thick liquids (Level 3, honey thick); Dysphagia 2 (Finely chopped) Liquid Administration via: Spoon; Cup Medication Administration: Crushed with puree Supervision: Patient able to self-feed; Full supervision/cueing for swallowing strategies Swallowing strategies  : Minimize environmental distractions; Slow rate; Small bites/sips; Clear throat intermittently Postural changes: Position pt fully upright for meals; Stay upright 30-60 min after meals Oral care recommendations: Oral care BID (2x/day) Treatment Plan Treatment Plan Treatment recommendations: Therapy as outlined in treatment plan below Follow-up recommendations: Acute inpatient rehab (3 hours/day) Functional status assessment: Patient has had a recent decline in their functional status and demonstrates the ability to make significant improvements in function in a reasonable and predictable amount of time. Treatment frequency: Min 2x/week Treatment duration: 2 weeks Interventions: Aspiration precaution training; Compensatory techniques; Patient/family education; Trials of upgraded texture/liquids; Diet toleration management by SLP Recommendations Recommendations for follow up therapy are one component of a multi-disciplinary discharge planning process, led by the attending physician.  Recommendations may be updated based on patient status, additional functional criteria and insurance authorization. Assessment: Orofacial Exam: Orofacial Exam Oral Cavity: Oral Hygiene: WFL Oral Cavity - Dentition: Adequate natural dentition Orofacial Anatomy: WFL Oral Motor/Sensory Function: WFL Anatomy: Anatomy: WFL Boluses Administered: Boluses Administered Boluses Administered: Thin liquids (Level 0); Mildly thick liquids (Level 2, nectar  thick); Moderately thick liquids (Level 3, honey thick); Puree; Solid  Oral Impairment Domain: Oral Impairment Domain Lip Closure: No labial escape Tongue control during bolus hold: Posterior escape of less than half of bolus Bolus preparation/mastication: Timely and efficient chewing and mashing Bolus transport/lingual motion: Brisk tongue motion Oral residue: Trace residue lining oral structures Location of oral residue : Tongue Initiation of pharyngeal swallow : Posterior laryngeal surface of the epiglottis  Pharyngeal Impairment Domain: Pharyngeal Impairment Domain Soft palate elevation: No bolus between soft palate (SP)/pharyngeal wall (PW) Laryngeal elevation: Complete superior movement of thyroid cartilage with complete approximation of arytenoids to epiglottic petiole Anterior hyoid excursion: Complete anterior movement Epiglottic movement: Complete inversion Laryngeal vestibule closure: Incomplete, narrow column air/contrast in laryngeal vestibule Pharyngeal stripping wave : Present - complete Pharyngeal contraction (A/P view only): N/A Pharyngoesophageal segment opening: Complete distension and complete duration, no obstruction of flow Tongue base retraction: Trace column of contrast or air between tongue base and PPW Pharyngeal residue: Complete pharyngeal clearance Location of pharyngeal residue: N/A  Esophageal Impairment Domain: Esophageal Impairment Domain Esophageal clearance upright position: Complete clearance, esophageal coating Pill: No data recorded Penetration/Aspiration Scale Score: Penetration/Aspiration Scale Score 1.  Material does not enter airway: Moderately thick liquids (Level 3, honey thick); Puree; Solid 8.  Material enters airway, passes BELOW cords without attempt by patient to eject out (silent aspiration) : Mildly thick liquids (Level 2, nectar thick); Thin liquids (Level 0) Compensatory Strategies: Compensatory Strategies Compensatory strategies: Yes Straw: Ineffective  Ineffective Straw: Thin liquid (Level 0); Mildly thick liquid (Level 2, nectar thick) Chin tuck: Effective Effective Chin Tuck: Thin liquid (Level 0); Mildly thick liquid (Level 2, nectar thick)   General Information: Caregiver  present: No  Diet Prior to this Study: NPO   Temperature : Normal   Respiratory Status: WFL   Supplemental O2: None (Room air)   History of Recent Intubation: Yes  Behavior/Cognition: Alert; Cooperative; Pleasant mood Self-Feeding Abilities: Able to self-feed Baseline vocal quality/speech: Dysphonic Volitional Cough: Able to elicit Volitional Swallow: Able to elicit Exam Limitations: Poor positioning Goal Planning: Prognosis for improved oropharyngeal function: Good Barriers to Reach Goals: Cognitive deficits; Time post onset No data recorded Patient/Family Stated Goal: very thirsty Consulted and agree with results and recommendations: Patient Pain: Pain Assessment Pain Assessment: Faces Faces Pain Scale: 0 Facial Expression: 0 Body Movements: 0 Muscle Tension: 0 Compliance with ventilator (intubated pts.): N/A Vocalization (extubated pts.): 0 CPOT Total: 0 Pain Location: generalized Pain Descriptors / Indicators: Discomfort; Grimacing Pain Intervention(s): Monitored during session End of Session: Start Time:SLP Start Time (ACUTE ONLY): 1452 Stop Time: SLP Stop Time (ACUTE ONLY): 1519 Time Calculation:SLP Time Calculation (min) (ACUTE ONLY): 27 min Charges: SLP Evaluations $ SLP Speech Visit: 1 Visit SLP Evaluations $BSS Swallow: 1 Procedure $MBS Swallow: 1 Procedure SLP visit diagnosis: SLP Visit Diagnosis: Dysphagia, oropharyngeal phase (R13.12) Past Medical History: Past Medical History: Diagnosis Date  Asthma   Cocaine use complicating pregnancy in third trimester 08/30/2017  Endometriosis   Ovarian cyst   Seizures (HCC)   Status post cesarean section 08/30/2017 Past Surgical History: Past Surgical History: Procedure Laterality Date  CESAREAN SECTION N/A 08/30/2017  Procedure: CESAREAN  SECTION;  Surgeon: Conard Novak, MD;  Location: [facility] ORS;  Service: Obstetrics;  Laterality: N/A;  LASER ABLATION/CAUTERIZATION OF ENDOMETRIAL IMPLANTS   Gwynneth Aliment, M.A., CF-SLP Speech Language Pathology, Acute Rehabilitation Services Secure Chat preferred [phone] 03/01/2023, 4:10 PM          Blood pressure (!) 106/51, pulse 90, temperature 97.6 F (36.4 C), temperature source Tympanic, resp. rate 18, height 5\' 1"  (1.549 m), weight 71.5 kg, SpO2 98%.   Medical Problem List and Plan: 1. Functional deficits secondary to debility due to status epilepticus with delirium and left thigh rhabdomyolysis and pain              -patient may shower             -ELOS/Goals: 10-12 days, Mod I to supervision PT/OT/SLP             -Admit to CIR  2.  Antithrombotics: -DVT/anticoagulation:  Pharmaceutical: Lovenox             -antiplatelet therapy: N/A 3. Pain Management: Tylenol prn.  4. Mood/Behavior/Sleep: LCSW to follow for evaluation and support.             -antipsychotic agents: Seroquel-->ativan and haldol d/c 5. Neuropsych/cognition: This patient is not fully capable of making decisions on his own behalf. 6. Skin/Wound Care: Routine pressure relief measures.  7. Fluids/Electrolytes/Nutrition: Monitor I/O. Check CMET in am. 8. Seizure d/o: On Keppra and Lamictal.             --status epilepticus due to THC/Covid and resolved. Followed by Dr. Malvin [facility]) 9. Aspiration PNA/Covid PNA:   MRSA+ BAL-> Vanc-->Linezolid thru 09/12 for 7 day course antibiotic              --aspiration precautions due to dysphagia. Covid precautions thru 09/14 10. Tachycardia: Likely due to deconditioning with HR 120-140. Monitor for symptoms.  11.  Left thigh pain/swelling: Traumatic due to rhabdo. CK improving from 47,715-->2,6909 --AKI has resolved. Tylenol prn for pain.  12. Delirium: Will continue  Seroquel 50 mg am/100 mg HS 13.  Dysphagia: DYS 2 with honey thick diet.  Continue SLP 14.  Marijuana use.   History of cocaine use.  Advised cessation 15.  Anemia/thrombocytosis.  Hgb up to 10.8 on 9/9.  Platelets 407K.  Recheck CMP       Jacquelynn Cree, PA-C 03/03/2023   I have personally performed a face to face diagnostic evaluation of this patient and formulated the key components of the plan.  Additionally, I have personally reviewed laboratory data, imaging studies, as well as relevant notes and concur with the physician assistant's documentation above.   The patient's status has not changed from the original H&P.  Any changes in documentation from the acute care chart have been noted above.   Fanny Dance, MD, [place]

## 2023-03-03 NOTE — Progress Notes (Signed)
Inpatient Rehabilitation Admission Medication Review by a Pharmacist  A complete drug regimen review was completed for this patient to identify any potential clinically significant medication issues.  High Risk Drug Classes Is patient taking? Indication by Medication  Antipsychotic Yes Seroquel - delirium Compazine - N/V  Anticoagulant Yes Lovenox - DVT px  Antibiotic Yes Linezolid - PNA  Opioid No   Antiplatelet No   Hypoglycemics/insulin No   Vasoactive Medication No   Chemotherapy No   Other Yes Keppra/lamictal -  seizure Melatonin - sleep Duonebs - COPD     Type of Medication Issue Identified Description of Issue Recommendation(s)  Drug Interaction(s) (clinically significant)     Duplicate Therapy     Allergy     No Medication Administration End Date     Incorrect Dose     Additional Drug Therapy Needed     Significant med changes from prior encounter (inform family/care partners about these prior to discharge).    Other  Levocetirizine, nexplanon  Resume at discharge    Clinically significant medication issues were identified that warrant physician communication and completion of prescribed/recommended actions by midnight of the next day:  No  Name of provider notified for urgent issues identified:   Provider Method of Notification:     Pharmacist comments:   Time spent performing this drug regimen review (minutes):  20   Ulyses Southward, PharmD, [place], AAHIVP, CPP Infectious Disease Pharmacist 03/03/2023 5:37 PM

## 2023-03-03 NOTE — Progress Notes (Signed)
PROGRESS NOTE    Colleen Pearson  KVQ:[id number] DOB: [birth year] DOA: 02/22/2023 PCP: Morene Crocker, MD    Brief Narrative:  This is a 32 year old female patient with a history of epilepsy, for which she takes Keppra 1000 mg twice daily, last breakthrough seizure noted in June 2024. Presented to the hospital via EMS after family contacted them for prolonged seizure. Reportedly seizure lasted for approximately 45 minutes. She was still in status epilepticus on EMS arrival requiring multiple doses of midazolam prehospital. On initial evaluation by EDP patient was unresponsive, requiring assisted ventilation via mask. She was intubated, loaded with IV Keppra seen by neurology, CT head was negative, additionally received a total of 4 L of crystalloid for intermittent hypotension. UDS positive for THC.   32 year old female patient admitted in status epilepticus intubated in the emergency room, received 4 L of crystalloid for intermittent hypotension.  Loaded with Keppra, , started on ceftriaxone for possible aspiration 9/2  EEG suggesting focal seizure episode  Extubated on 9/3 Re intubated 9/4. COVID positive.  Extubated 9/6 Medically stable 9/11.  Assessment & Plan:   Principal Problem:   Status epilepticus (HCC) Active Problems:   Aspiration pneumonia of both lower lobes (HCC)   Seizure (HCC)   Pneumonia due to COVID-19 virus   Rhabdomyolysis  # Status epilepticus Resolved,neuro has signed off - continue current anti-epileptics kappra and lamictal  # Delirium Appears resolved, previously on Precedex.  remains on scheduled seroquel and prn ativan and haldol.  Continue Seroquel.  Discontinue Ativan and haloperidol.  Can use for seizure.  # Left thigh pain/swelling.  Traumatic rhabdomyolysis.  Likely rupture of the muscles. - negative x ray.  Symptomatic treatment.  Some clinical improvement today.  # Aspiration pneumonia # MRSA pneumnonia # Covid pneumonia Trachial aspirate  mrsa positive. Now weaned off o2 - on Vancomycin - now on linezolid.  Complete 7 days of therapy until 9/12. -Patient was on steroids, discontinued.  # AKI 2/2 rhabdo, resolved  # Rhabdomyolysis 2/2 seizure. Ck remains elevated but improving, k and cr are normal -With normal renal functions and improved oral intake.  IV fluids discontinued.  # Debility Currently being evaluated for CIR.  Stable.  # Dysphagia Was on tube feeding.  Discontinued.  Tolerating regular diet.    # Demand Ischemia :  Troponin elevated but flat . No chest pain. Echo without any wall motion abnormalities.  Medically improving.  Stable to transfer to acute inpatient rehab.  DVT prophylaxis: lovenox Code Status: full Family Communication: None at the bedside  Level of care: Med-Surg Status is: Inpatient Remains inpatient appropriate because: Needs rehab.    Consultants:  Pccm, neuro  Procedures: eeg  Antimicrobials:  vancomycin - linezolid   Subjective: Patient seen and examined.  Up in the chair.  Cough improved with Mucinex.  On room air.  Eating regular diet.  Wondering about rehab process.  Left thigh pain is better controlled today.  Objective: Vitals:   03/02/23 1929 03/02/23 2308 03/03/23 0315 03/03/23 0749  BP: 127/71 125/76 (!) 113/50 122/61  Pulse: (!) 116 (!) 105  (!) 110  Resp: 20 14 20 18   Temp: 98.4 F (36.9 C) 98.3 F (36.8 C) 98.4 F (36.9 C) 98.2 F (36.8 C)  TempSrc: Oral Oral Oral Oral  SpO2:    97%  Weight:      Height:        Intake/Output Summary (Last 24 hours) at 03/03/2023 1049 Last data filed at 03/03/2023 0751 Gross per  24 hour  Intake 480 ml  Output 1400 ml  Net -920 ml   Filed Weights   02/25/23 0343 02/26/23 0704 03/01/23 0519  Weight: 62.4 kg 66.9 kg 71.5 kg    Examination:  General: Looks fairly comfortable.  Alert and awake.  Eating breakfast. Cardiovascular: S1-S2 normal.  Regular rate rhythm. Respiratory: Bilateral clear.  No added  sounds.  She does have some dry cough. Gastrointestinal: Soft.  Nontender.  Bowel sound present.  NG tube infusing. Ext: No cyanosis or edema. Left anterior thigh with diffuse swelling, mildly tender to palpation.  No erythema redness.  Normal temperature. Neuro: No deficits.   Data Reviewed: I have personally reviewed following labs and imaging studies  CBC: Recent Labs  Lab 02/25/23 0409 02/25/23 0912 02/25/23 1505 02/26/23 0800 02/27/23 1211 02/28/23 0825 03/01/23 0502  WBC 9.3  --   --  12.8* 10.6* 10.7* 13.2*  HGB 8.6*   < > 8.2* 8.0* 9.5* 9.6* 10.8*  HCT 24.7*   < > 24.0* 24.5* 28.8* 29.0* 32.4*  MCV 84.3  --   --  88.1 90.0 87.1 87.3  PLT 169  --   --  187 252 290 407*   < > = values in this interval not displayed.   Basic Metabolic Panel: Recent Labs  Lab 02/24/23 1953 02/25/23 0409 02/26/23 0800 02/27/23 1211 02/28/23 0825 03/01/23 0502 03/02/23 0807  NA  --    < > 141 139 139 137 140  K  --    < > 4.7 3.4* 3.9 3.9 4.0  CL  --    < > 111 107 106 106 102  CO2  --    < > 24 25 21* 23 21*  GLUCOSE  --    < > 126* 113* 137* 62* 144*  BUN  --    < > 23* 28* 21* 18 14  CREATININE  --    < > 1.11* 1.01* 0.87 0.92 0.85  CALCIUM  --    < > 7.4* 7.9* 8.0* 8.3* 9.2  MG 2.2  --   --   --   --   --   --   PHOS 2.7  --   --   --   --   --   --    < > = values in this interval not displayed.   GFR: Estimated Creatinine Clearance: 85.9 mL/min (by C-G formula based on SCr of 0.85 mg/dL). Liver Function Tests: No results for input(s): "AST", "ALT", "ALKPHOS", "BILITOT", "PROT", "ALBUMIN" in the last 168 hours. No results for input(s): "LIPASE", "AMYLASE" in the last 168 hours. No results for input(s): "AMMONIA" in the last 168 hours. Coagulation Profile: No results for input(s): "INR", "PROTIME" in the last 168 hours. Cardiac Enzymes: Recent Labs  Lab 02/26/23 0439 02/27/23 1211 02/28/23 0825 03/01/23 0502 03/02/23 0807  CKTOTAL 28,375* 31,091* 15,098* 12,220*  5,552*   BNP (last 3 results) No results for input(s): "PROBNP" in the last 8760 hours. HbA1C: No results for input(s): "HGBA1C" in the last 72 hours. CBG: Recent Labs  Lab 03/01/23 2044 03/01/23 2309 03/02/23 0411 03/02/23 0751 03/02/23 1137  GLUCAP 107* 147* 115* 146* 142*   Lipid Profile: No results for input(s): "CHOL", "HDL", "LDLCALC", "TRIG", "CHOLHDL", "LDLDIRECT" in the last 72 hours. Thyroid Function Tests: No results for input(s): "TSH", "T4TOTAL", "FREET4", "T3FREE", "THYROIDAB" in the last 72 hours. Anemia Panel: No results for input(s): "VITAMINB12", "FOLATE", "FERRITIN", "TIBC", "IRON", "RETICCTPCT" in the last 72 hours. Urine  analysis:    Component Value Date/Time   COLORURINE YELLOW 02/25/2023 0114   APPEARANCEUR CLEAR 02/25/2023 0114   APPEARANCEUR Cloudy 10/04/2014 1606   LABSPEC 1.026 02/25/2023 0114   LABSPEC 1.014 10/04/2014 1606   PHURINE 7.0 02/25/2023 0114   GLUCOSEU >=500 (A) 02/25/2023 0114   GLUCOSEU Negative 10/04/2014 1606   HGBUR MODERATE (A) 02/25/2023 0114   BILIRUBINUR NEGATIVE 02/25/2023 0114   BILIRUBINUR Negative 10/04/2014 1606   KETONESUR 5 (A) 02/25/2023 0114   PROTEINUR 100 (A) 02/25/2023 0114   UROBILINOGEN 1.0 09/29/2014 2122   NITRITE NEGATIVE 02/25/2023 0114   LEUKOCYTESUR NEGATIVE 02/25/2023 0114   LEUKOCYTESUR Trace 10/04/2014 1606   Sepsis Labs: @LABRCNTIP (procalcitonin:4,lacticidven:4)  ) Recent Results (from the past 240 hour(s))  Urine Culture     Status: None   Collection Time: 02/22/23 12:37 AM   Specimen: Urine, Catheterized  Result Value Ref Range Status   Specimen Description URINE, CATHETERIZED  Final   Special Requests NONE  Final   Culture   Final    NO GROWTH Performed at [facility], [address], [place], [place] [address]    Report Status 02/23/2023 FINAL  Final  Culture, blood (Routine X 2) w Reflex to ID Panel     Status: None   Collection Time: 02/22/23  2:44 AM   Specimen: BLOOD   Result Value Ref Range Status   Specimen Description BLOOD RIGHT WRIST  Final   Special Requests   Final    BOTTLES DRAWN AEROBIC AND ANAEROBIC Blood Culture adequate volume   Culture   Final    NO GROWTH 5 DAYS Performed at [facility], [address], [place], [place] [address]    Report Status 02/27/2023 FINAL  Final  Culture, blood (Routine X 2) w Reflex to ID Panel     Status: None   Collection Time: 02/22/23  2:46 AM   Specimen: BLOOD  Result Value Ref Range Status   Specimen Description BLOOD RIGHT ANTECUBITAL  Final   Special Requests   Final    BOTTLES DRAWN AEROBIC AND ANAEROBIC Blood Culture adequate volume   Culture   Final    NO GROWTH 5 DAYS Performed at [facility], [address] [place], [place] [address]    Report Status 02/27/2023 FINAL  Final  MRSA Next Gen by PCR, Nasal     Status: Abnormal   Collection Time: 02/22/23  3:47 AM   Specimen: Nasal Mucosa; Nasal Swab  Result Value Ref Range Status   MRSA by PCR Next Gen DETECTED (A) NOT DETECTED Final    Comment: RESULT CALLED TO, READ BACK BY AND VERIFIED WITH: J TIRRELL,RN@0549  02/22/23 MK (NOTE) The GeneXpert MRSA Assay (FDA approved for NASAL specimens only), is one component of a comprehensive MRSA colonization surveillance program. It is not intended to diagnose MRSA infection nor to guide or monitor treatment for MRSA infections. Test performance is not FDA approved in patients less than 31 years old. Performed at [facility], [address], [place], [place] [address]   Culture, Respiratory w Gram Stain     Status: None   Collection Time: 02/23/23  8:19 AM   Specimen: Tracheal Aspirate; Respiratory  Result Value Ref Range Status   Specimen Description TRACHEAL ASPIRATE  Final   Special Requests NONE  Final   Gram Stain   Final    FEW WBC PRESENT, PREDOMINANTLY PMN NO ORGANISMS SEEN Performed at [facility], [address], [place], [place] [address]    Culture  RARE METHICILLIN RESISTANT STAPHYLOCOCCUS AUREUS  Final   Report Status 02/25/2023 FINAL  Final   Organism ID, Bacteria METHICILLIN RESISTANT STAPHYLOCOCCUS AUREUS  Final      Susceptibility   Methicillin resistant staphylococcus aureus - MIC*    CIPROFLOXACIN <=0.5 SENSITIVE Sensitive     ERYTHROMYCIN <=0.25 SENSITIVE Sensitive     GENTAMICIN <=0.5 SENSITIVE Sensitive     OXACILLIN >=4 RESISTANT Resistant     TETRACYCLINE <=1 SENSITIVE Sensitive     VANCOMYCIN <=0.5 SENSITIVE Sensitive     TRIMETH/SULFA <=10 SENSITIVE Sensitive     CLINDAMYCIN <=0.25 SENSITIVE Sensitive     RIFAMPIN <=0.5 SENSITIVE Sensitive     Inducible Clindamycin NEGATIVE Sensitive     LINEZOLID 2 SENSITIVE Sensitive     * RARE METHICILLIN RESISTANT STAPHYLOCOCCUS AUREUS  SARS Coronavirus 2 by RT PCR (hospital order, performed in [facility] hospital lab) *cepheid single result test* Anterior Nasal Swab     Status: Abnormal   Collection Time: 02/24/23  7:21 AM   Specimen: Anterior Nasal Swab  Result Value Ref Range Status   SARS Coronavirus 2 by RT PCR POSITIVE (A) NEGATIVE Final    Comment: Performed at [facility], [address], [place], [place] [address]  Culture, Respiratory w Gram Stain     Status: None   Collection Time: 02/24/23 10:49 AM   Specimen: Bronchoalveolar Lavage; Respiratory  Result Value Ref Range Status   Specimen Description BRONCHIAL ALVEOLAR LAVAGE  Final   Special Requests NONE  Final   Gram Stain   Final    ABUNDANT WBC PRESENT, PREDOMINANTLY PMN NO ORGANISMS SEEN Performed at [facility], [address] [place], [place] [address]    Culture RARE CANDIDA DUBLINIENSIS  Final   Report Status 02/28/2023 FINAL  Final         Radiology Studies: VAS [place] LOWER EXTREMITY VENOUS (DVT)  Result Date: 03/02/2023  Lower Venous DVT Study Patient Name:  ELEISHA ANTONIO [facility]  Date of Exam:   03/02/2023 Medical Rec #: [id number]           Accession #:    [id number] Date of Birth:  04-05-1991           Patient Gender: F Patient Age:   34 years Exam Location:  [facility] Procedure:      VAS [place] LOWER EXTREMITY VENOUS (DVT) Referring Phys: Shonna Chock --------------------------------------------------------------------------------  Indications: Left thigh swelling in setting of rhabdo. Other Indications: Covid-19. Comparison Study: No prior studies. Performing Technologist: Jean Rosenthal RDMS, RVT  Examination Guidelines: A complete evaluation includes B-mode imaging, spectral Doppler, color Doppler, and power Doppler as needed of all accessible portions of each vessel. Bilateral testing is considered an integral part of a complete examination. Limited examinations for reoccurring indications may be performed as noted. The reflux portion of the exam is performed with the patient in reverse Trendelenburg.  +-----+---------------+---------+-----------+----------+--------------+ RIGHTCompressibilityPhasicitySpontaneityPropertiesThrombus Aging +-----+---------------+---------+-----------+----------+--------------+ CFV  Full           Yes      Yes                                 +-----+---------------+---------+-----------+----------+--------------+   +---------+---------------+---------+-----------+----------+--------------+ LEFT     CompressibilityPhasicitySpontaneityPropertiesThrombus Aging +---------+---------------+---------+-----------+----------+--------------+ CFV      Full           Yes      Yes                                 +---------+---------------+---------+-----------+----------+--------------+  SFJ      Full                                                        +---------+---------------+---------+-----------+----------+--------------+ FV Prox  Full                                                        +---------+---------------+---------+-----------+----------+--------------+ FV Mid   Full                                                         +---------+---------------+---------+-----------+----------+--------------+ FV DistalFull           Yes      Yes                                 +---------+---------------+---------+-----------+----------+--------------+ PFV      Full                                                        +---------+---------------+---------+-----------+----------+--------------+ POP      Full           Yes      Yes                                 +---------+---------------+---------+-----------+----------+--------------+ PTV      Full                                                        +---------+---------------+---------+-----------+----------+--------------+ PERO     Full                                                        +---------+---------------+---------+-----------+----------+--------------+     Summary: RIGHT: - No evidence of common femoral vein obstruction.  LEFT: - There is no evidence of deep vein thrombosis in the lower extremity.  - No cystic structure found in the popliteal fossa.  *See table(s) above for measurements and observations. Electronically signed by Lemar Livings MD on 03/02/2023 at 5:40:15 PM.    Final    ECHOCARDIOGRAM COMPLETE  Result Date: 03/02/2023    ECHOCARDIOGRAM REPORT   Patient Name:   ANNEL PEREZMARTINEZ [facility] Date of Exam: 03/02/2023 Medical Rec #:  [id number]          Height:       61.0 in Accession #:    [id number]  Weight:       157.6 lb Date of Birth:  [birth year]          BSA:          1.707 m Patient Age:    32 years           BP:           130/82 mmHg Patient Gender: F                  HR:           98 bpm. Exam Location:  Inpatient Procedure: 2D Echo, Color Doppler and Cardiac Doppler Indications:    Elevated Troponin  History:        Patient has no prior history of Echocardiogram examinations.                 Seizure; Risk Factors:Current Smoker. Substance Abuse.  Sonographer:    Aron Baba Referring Phys: [id number] NOAH BEDFORD  QIHK  Sonographer Comments: Image acquisition challenging due to respiratory motion. IMPRESSIONS  1. Left ventricular ejection fraction, by estimation, is 50 to 55%. The left ventricle has low normal function. The left ventricle has no regional wall motion abnormalities. Left ventricular diastolic parameters were normal.  2. Right ventricular systolic function is normal. The right ventricular size is normal. There is normal pulmonary artery systolic pressure. The estimated right ventricular systolic pressure is 11.5 mmHg.  3. The mitral valve is grossly normal. Trivial mitral valve regurgitation. No evidence of mitral stenosis.  4. The aortic valve is grossly normal. Aortic valve regurgitation is not visualized. No aortic stenosis is present.  5. The inferior vena cava is normal in size with greater than 50% respiratory variability, suggesting right atrial pressure of 3 mmHg. FINDINGS  Left Ventricle: Left ventricular ejection fraction, by estimation, is 50 to 55%. The left ventricle has low normal function. The left ventricle has no regional wall motion abnormalities. The left ventricular internal cavity size was normal in size. There is no left ventricular hypertrophy. Left ventricular diastolic parameters were normal. Right Ventricle: The right ventricular size is normal. No increase in right ventricular wall thickness. Right ventricular systolic function is normal. There is normal pulmonary artery systolic pressure. The tricuspid regurgitant velocity is 1.46 m/s, and  with an assumed right atrial pressure of 3 mmHg, the estimated right ventricular systolic pressure is 11.5 mmHg. Left Atrium: Left atrial size was normal in size. Right Atrium: Right atrial size was normal in size. Pericardium: There is no evidence of pericardial effusion. Mitral Valve: The mitral valve is grossly normal. Trivial mitral valve regurgitation. No evidence of mitral valve stenosis. Tricuspid Valve: The tricuspid valve is grossly  normal. Tricuspid valve regurgitation is trivial. No evidence of tricuspid stenosis. Aortic Valve: The aortic valve is grossly normal. Aortic valve regurgitation is not visualized. No aortic stenosis is present. Pulmonic Valve: The pulmonic valve was grossly normal. Pulmonic valve regurgitation is not visualized. No evidence of pulmonic stenosis. Aorta: The aortic root and ascending aorta are structurally normal, with no evidence of dilitation. Venous: The inferior vena cava is normal in size with greater than 50% respiratory variability, suggesting right atrial pressure of 3 mmHg. IAS/Shunts: The atrial septum is grossly normal.  LEFT VENTRICLE PLAX 2D LVIDd:         4.00 cm   Diastology LVIDs:         3.00 cm   LV e' medial:    8.05 cm/s LV PW:  0.90 cm   LV E/e' medial:  13.3 LV IVS:        0.70 cm   LV e' lateral:   11.00 cm/s LVOT diam:     1.40 cm   LV E/e' lateral: 9.7 LV SV:         29 LV SV Index:   17 LVOT Area:     1.54 cm  RIGHT VENTRICLE RV S prime:     16.30 cm/s TAPSE (M-mode): 1.5 cm LEFT ATRIUM             Index        RIGHT ATRIUM           Index LA diam:        2.60 cm 1.52 cm/m   RA Area:     10.10 cm LA Vol (A2C):   25.5 ml 14.94 ml/m  RA Volume:   19.30 ml  11.31 ml/m LA Vol (A4C):   22.9 ml 13.42 ml/m LA Biplane Vol: 24.9 ml 14.59 ml/m  AORTIC VALVE LVOT Vmax:   130.00 cm/s LVOT Vmean:  83.200 cm/s LVOT VTI:    0.186 m  AORTA Ao Root diam: 2.60 cm Ao Asc diam:  2.50 cm MITRAL VALVE                TRICUSPID VALVE MV Area (PHT): 5.42 cm     TR Peak grad:   8.5 mmHg MV Decel Time: 140 msec     TR Vmax:        146.00 cm/s MR Peak grad: 7.1 mmHg MR Vmax:      133.00 cm/s   SHUNTS MV E velocity: 107.00 cm/s  Systemic VTI:  0.19 m MV A velocity: 75.30 cm/s   Systemic Diam: 1.40 cm MV E/A ratio:  1.42 Lennie Odor MD Electronically signed by Lennie Odor MD Signature Date/Time: 03/02/2023/11:15:54 AM    Final    DG HIP PORT UNILAT WITH PELVIS 1V LEFT  Result Date:  03/01/2023 CLINICAL DATA:  Left hip pain following fall due to seizure activity. EXAM: DG HIP (WITH OR WITHOUT PELVIS) 1V PORT LEFT COMPARISON:  01/24/2015. FINDINGS: There is no evidence of acute hip fracture or dislocation. A density is noted over the left hip on AP view which is likely external to the patient and not seen on additional view. A density is noted in the soft tissues lateral to the proximal left femoral shaft, which may also be external to the patient. There is no evidence of arthropathy or other focal bone abnormality. IMPRESSION: 1. No acute osseous abnormality. 2. Density overlying the left hip on one view and proximal left thigh on the lateral view may be external to the patient. Clinical correlation is recommended and repeat imaging may be obtained if clinically warranted. Electronically Signed   By: Thornell Sartorius M.D.   On: 03/01/2023 20:25   DG Swallowing Func-Speech Pathology  Result Date: 03/01/2023 Table formatting from the original result was not included. Modified Barium Swallow Study Patient Details Name: ELLOWYN JEFFORDS MRN: [id number] Date of Birth: Nov 15, 1990 Today's Date: 03/01/2023 HPI/PMH: HPI: ELLEN MASSE is a 32 yo female presenting to ED 9/2 from home after a prolonged seizure (lasted at least 45 minutes continuously). Intubated upon EMS arrival 9/2-9/3, reintubated 9/4-9/6. Cortrak placed 9/4.  CTH 9/2 unremarkable. CXR 9/3 with new increased hazy opacities in lower lung fields and mid perihilar areas. PMH includes asthma, THC and cocaine use, endometriosis, ovarian cyst, seizures Clinical Impression: Clinical  Impression: Pt presents with a moderate oropharyngeal dysphagia characterized by impaired timing and sensation. Pt with poor bolus control resulting in posterior progression of less than half of thin liquid boluses to the level of the valleculae before being cued to swallow. This appears to happen frequently even in spontaneous sips with boluses reaching the level  of the posterior surface of the epiglottis prior to initiation of laryngeal vestibule closure. This results in consistent penetration of liquid boluses, with thin liquids and nectar thick liquids initially progressing to the level of the vocal folds without sensation and subsequently resulting in trace silent aspiration (PAS 8) during cued cough attempts. This was improved although not completely resolved by a cued chin tuck, resulting in penetration remaining above the vocal folds (PAS 3) with thin liquids and nectar thick liquids. Question pt's ability to consistently use these compensatory strategies without full supervision to cue due to her impulisivity and cognition. No further penetration/aspiration noted with trials of honey thick liquids, purees, or solids. Pt presents with a strong cough, although her laryngeal sensation appears to be impaired, which may be representative of ETT trauma. Given pt's cognition and respiratory status, recommend initiating diet of Dys 2 textures with honey thick liquids and full supervision to cue pt to clear her throat intermittently. Recommend meds be given crushed in puree. Feel that she shows great potential to upgrade diet given intensive SLP f/u to ensure consistent use of compensatory strategies. Will continue to follow. Factors that may increase risk of adverse event in presence of aspiration Rubye Oaks & Clearance Coots 2021): Factors that may increase risk of adverse event in presence of aspiration Rubye Oaks & Clearance Coots 2021): Presence of tubes (ETT, trach, NG, etc.); Aspiration of thick, dense, and/or acidic materials Recommendations/Plan: Swallowing Evaluation Recommendations Swallowing Evaluation Recommendations Recommendations: PO diet PO Diet Recommendation: Moderately thick liquids (Level 3, honey thick); Dysphagia 2 (Finely chopped) Liquid Administration via: Spoon; Cup Medication Administration: Crushed with puree Supervision: Patient able to self-feed; Full  supervision/cueing for swallowing strategies Swallowing strategies  : Minimize environmental distractions; Slow rate; Small bites/sips; Clear throat intermittently Postural changes: Position pt fully upright for meals; Stay upright 30-60 min after meals Oral care recommendations: Oral care BID (2x/day) Treatment Plan Treatment Plan Treatment recommendations: Therapy as outlined in treatment plan below Follow-up recommendations: Acute inpatient rehab (3 hours/day) Functional status assessment: Patient has had a recent decline in their functional status and demonstrates the ability to make significant improvements in function in a reasonable and predictable amount of time. Treatment frequency: Min 2x/week Treatment duration: 2 weeks Interventions: Aspiration precaution training; Compensatory techniques; Patient/family education; Trials of upgraded texture/liquids; Diet toleration management by SLP Recommendations Recommendations for follow up therapy are one component of a multi-disciplinary discharge planning process, led by the attending physician.  Recommendations may be updated based on patient status, additional functional criteria and insurance authorization. Assessment: Orofacial Exam: Orofacial Exam Oral Cavity: Oral Hygiene: WFL Oral Cavity - Dentition: Adequate natural dentition Orofacial Anatomy: WFL Oral Motor/Sensory Function: WFL Anatomy: Anatomy: WFL Boluses Administered: Boluses Administered Boluses Administered: Thin liquids (Level 0); Mildly thick liquids (Level 2, nectar thick); Moderately thick liquids (Level 3, honey thick); Puree; Solid  Oral Impairment Domain: Oral Impairment Domain Lip Closure: No labial escape Tongue control during bolus hold: Posterior escape of less than half of bolus Bolus preparation/mastication: Timely and efficient chewing and mashing Bolus transport/lingual motion: Brisk tongue motion Oral residue: Trace residue lining oral structures Location of oral residue : Tongue  Initiation of pharyngeal swallow :  Posterior laryngeal surface of the epiglottis  Pharyngeal Impairment Domain: Pharyngeal Impairment Domain Soft palate elevation: No bolus between soft palate (SP)/pharyngeal wall (PW) Laryngeal elevation: Complete superior movement of thyroid cartilage with complete approximation of arytenoids to epiglottic petiole Anterior hyoid excursion: Complete anterior movement Epiglottic movement: Complete inversion Laryngeal vestibule closure: Incomplete, narrow column air/contrast in laryngeal vestibule Pharyngeal stripping wave : Present - complete Pharyngeal contraction (A/P view only): N/A Pharyngoesophageal segment opening: Complete distension and complete duration, no obstruction of flow Tongue base retraction: Trace column of contrast or air between tongue base and PPW Pharyngeal residue: Complete pharyngeal clearance Location of pharyngeal residue: N/A  Esophageal Impairment Domain: Esophageal Impairment Domain Esophageal clearance upright position: Complete clearance, esophageal coating Pill: No data recorded Penetration/Aspiration Scale Score: Penetration/Aspiration Scale Score 1.  Material does not enter airway: Moderately thick liquids (Level 3, honey thick); Puree; Solid 8.  Material enters airway, passes BELOW cords without attempt by patient to eject out (silent aspiration) : Mildly thick liquids (Level 2, nectar thick); Thin liquids (Level 0) Compensatory Strategies: Compensatory Strategies Compensatory strategies: Yes Straw: Ineffective Ineffective Straw: Thin liquid (Level 0); Mildly thick liquid (Level 2, nectar thick) Chin tuck: Effective Effective Chin Tuck: Thin liquid (Level 0); Mildly thick liquid (Level 2, nectar thick)   General Information: Caregiver present: No  Diet Prior to this Study: NPO   Temperature : Normal   Respiratory Status: WFL   Supplemental O2: None (Room air)   History of Recent Intubation: Yes  Behavior/Cognition: Alert; Cooperative; Pleasant mood  Self-Feeding Abilities: Able to self-feed Baseline vocal quality/speech: Dysphonic Volitional Cough: Able to elicit Volitional Swallow: Able to elicit Exam Limitations: Poor positioning Goal Planning: Prognosis for improved oropharyngeal function: Good Barriers to Reach Goals: Cognitive deficits; Time post onset No data recorded Patient/Family Stated Goal: very thirsty Consulted and agree with results and recommendations: Patient Pain: Pain Assessment Pain Assessment: Faces Faces Pain Scale: 0 Facial Expression: 0 Body Movements: 0 Muscle Tension: 0 Compliance with ventilator (intubated pts.): N/A Vocalization (extubated pts.): 0 CPOT Total: 0 Pain Location: generalized Pain Descriptors / Indicators: Discomfort; Grimacing Pain Intervention(s): Monitored during session End of Session: Start Time:SLP Start Time (ACUTE ONLY): 1452 Stop Time: SLP Stop Time (ACUTE ONLY): 1519 Time Calculation:SLP Time Calculation (min) (ACUTE ONLY): 27 min Charges: SLP Evaluations $ SLP Speech Visit: 1 Visit SLP Evaluations $BSS Swallow: 1 Procedure $MBS Swallow: 1 Procedure SLP visit diagnosis: SLP Visit Diagnosis: Dysphagia, oropharyngeal phase (R13.12) Past Medical History: Past Medical History: Diagnosis Date  Asthma   Cocaine use complicating pregnancy in third trimester 08/30/2017  Endometriosis   Ovarian cyst   Seizures (HCC)   Status post cesarean section 08/30/2017 Past Surgical History: Past Surgical History: Procedure Laterality Date  CESAREAN SECTION N/A 08/30/2017  Procedure: CESAREAN SECTION;  Surgeon: Conard Novak, MD;  Location: [facility] ORS;  Service: Obstetrics;  Laterality: N/A;  LASER ABLATION/CAUTERIZATION OF ENDOMETRIAL IMPLANTS   Gwynneth Aliment, M.A., CF-SLP Speech Language Pathology, Acute Rehabilitation Services Secure Chat preferred [phone] 03/01/2023, 4:10 PM       Scheduled Meds:  Chlorhexidine Gluconate Cloth  6 each Topical Daily   docusate  100 mg Per Tube BID   enoxaparin (LOVENOX) injection  40  mg Subcutaneous Q24H   Gerhardt's butt cream   Topical Daily   lamoTRIgine  150 mg Per Tube BID   levETIRAcetam  1,000 mg Per Tube BID   linezolid  600 mg Oral Q12H   polyethylene glycol  17 g Per Tube Daily  QUEtiapine  100 mg Per Tube QHS   QUEtiapine  50 mg Per Tube Q1400   Continuous Infusions:  sodium chloride Stopped (02/24/23 1217)     LOS: 9 days     Dorcas Carrow, MD [facility]   Total time spent : 35 minutes

## 2023-03-03 NOTE — Plan of Care (Signed)
  Problem: Education: Goal: Knowledge of General Education information will improve Description: Including pain rating scale, medication(s)/side effects and non-pharmacologic comfort measures 03/03/2023 1635 by Dannielle Burn, RN Outcome: Adequate for Discharge 03/03/2023 0732 by Dannielle Burn, RN Outcome: Progressing   Problem: Health Behavior/Discharge Planning: Goal: Ability to manage health-related needs will improve 03/03/2023 1635 by Dannielle Burn, RN Outcome: Adequate for Discharge 03/03/2023 0732 by Dannielle Burn, RN Outcome: Progressing   Problem: Clinical Measurements: Goal: Ability to maintain clinical measurements within normal limits will improve 03/03/2023 1635 by Dannielle Burn, RN Outcome: Adequate for Discharge 03/03/2023 0732 by Dannielle Burn, RN Outcome: Progressing Goal: Will remain free from infection 03/03/2023 1635 by Dannielle Burn, RN Outcome: Adequate for Discharge 03/03/2023 0732 by Dannielle Burn, RN Outcome: Progressing Goal: Diagnostic test results will improve 03/03/2023 1635 by Dannielle Burn, RN Outcome: Adequate for Discharge 03/03/2023 0732 by Dannielle Burn, RN Outcome: Progressing Goal: Respiratory complications will improve 03/03/2023 1635 by Dannielle Burn, RN Outcome: Adequate for Discharge 03/03/2023 0732 by Dannielle Burn, RN Outcome: Progressing Goal: Cardiovascular complication will be avoided 03/03/2023 1635 by Dannielle Burn, RN Outcome: Adequate for Discharge 03/03/2023 0732 by Dannielle Burn, RN Outcome: Progressing   Problem: Activity: Goal: Risk for activity intolerance will decrease 03/03/2023 1635 by Dannielle Burn, RN Outcome: Adequate for Discharge 03/03/2023 0732 by Dannielle Burn, RN Outcome: Progressing   Problem: Nutrition: Goal: Adequate nutrition will be maintained 03/03/2023 1635 by Dannielle Burn, RN Outcome: Adequate for Discharge 03/03/2023 0732 by Dannielle Burn, RN Outcome:  Progressing   Problem: Coping: Goal: Level of anxiety will decrease 03/03/2023 1635 by Dannielle Burn, RN Outcome: Adequate for Discharge 03/03/2023 0732 by Dannielle Burn, RN Outcome: Progressing   Problem: Elimination: Goal: Will not experience complications related to bowel motility 03/03/2023 1635 by Dannielle Burn, RN Outcome: Adequate for Discharge 03/03/2023 0732 by Dannielle Burn, RN Outcome: Progressing Goal: Will not experience complications related to urinary retention 03/03/2023 1635 by Dannielle Burn, RN Outcome: Adequate for Discharge 03/03/2023 0732 by Dannielle Burn, RN Outcome: Progressing   Problem: Pain Managment: Goal: General experience of comfort will improve 03/03/2023 1635 by Dannielle Burn, RN Outcome: Adequate for Discharge 03/03/2023 0732 by Dannielle Burn, RN Outcome: Progressing   Problem: Safety: Goal: Ability to remain free from injury will improve 03/03/2023 1635 by Dannielle Burn, RN Outcome: Adequate for Discharge 03/03/2023 0732 by Dannielle Burn, RN Outcome: Progressing   Problem: Skin Integrity: Goal: Risk for impaired skin integrity will decrease 03/03/2023 1635 by Dannielle Burn, RN Outcome: Adequate for Discharge 03/03/2023 0732 by Dannielle Burn, RN Outcome: Progressing   Problem: Activity: Goal: Ability to tolerate increased activity will improve 03/03/2023 1635 by Dannielle Burn, RN Outcome: Adequate for Discharge 03/03/2023 0732 by Dannielle Burn, RN Outcome: Progressing

## 2023-03-03 NOTE — Progress Notes (Signed)
Inpatient Rehabilitation Admissions Coordinator   I have insurance approval and can admit to [facility] today. I met with patient at bedside and she is in agreement. Acute team and TOC made aware. I will make the arrangements.  Ottie Glazier, RN, MSN Rehab Admissions Coordinator [phone] 03/03/2023 1:39 PM

## 2023-03-03 NOTE — Progress Notes (Signed)
Fanny Dance, MD  Physician Physical Medicine and Rehabilitation   PMR Pre-admission     Signed   Date of Service: 03/02/2023  5:16 PM  Related encounter: ED to Hosp-Admission (Discharged) from 02/22/2023 in [place] [facility]   Signed     Expand All Collapse All  Show:Clear all [x] Written[x] Templated[] Copied  Added by: [x] Ferris Fielden, Tye [place], RN[x] Fanny Dance, MD  [] Hover for details PMR Admission Coordinator Pre-Admission Assessment   Patient: Colleen Pearson is an 32 y.o., female MRN: [id number] DOB: April 25, 1991 Height: 5\' 1"  (154.9 cm) Weight: 71.5 kg   Insurance Information HMO:     PPO:      PCP:      IPA:      80/20:      OTHER:  PRIMARY: Medicaid [place]      Policy#: [id number]      Subscriber: pt CM Name: Faxed approval      Phone#: [phone]     Fax#: [phone] Pre-Cert#: [id number] approved 9/11 until 03/10/23. Update due 9/19 by 5 pm  fax [phone]:  Benefits:  Phone #: [phone]     Name: 9/10 Eff. Date: 05/22/2022 until 07/23/2023     Deduct: none      Out of Pocket Max: none      Life Max: none CIR: per Medicaid      SNF: per medicaid Outpatient: per medicaid     Co-Pay:  Home Health: per medicaid      Co-Pay:  DME: per medicaid     Co-Pay:  Providers: in network  SECONDARY: none        Financial Counselor:       Phone#:    The Data processing manager" for patients in Inpatient Rehabilitation Facilities with attached "Privacy Act Statement-Health Care Records" was provided and verbally reviewed with: N/A   Emergency Contact Information Contact Information   None on File      Other Contacts       Name Relation Home Work Mobile    Hellickson,Cecil Father     [phone]    Bentlei, Wiatr Mother     [phone]         Current Medical History  Patient Admitting Diagnosis: Seizures, post COVID   History of Present Illness: 32 year old female with a history of  epilepsy for which she take Keppra. Last breakthrough seizure 6/24. Presented on 02/22/23 for prolonged seizure. Required multiple doses of midazolam pre hospital. Unresponsive on arrival and intubated.   Neurology consulted. Started on ceftriaxone for possible aspiration. EEG suggesting focal seizure. Extubated on 9/3. Failed extubation and re intubated on 9/4. Found to be COVID positive.  Eventual extubation on 9/6. To continue on Keppra and Lamictal. Acute delirium initially but now resolving . Weaned off precedex but remains on Seroquel and prn ativan and haldol. Left thigh pain and swelling felt related to traumatic rhabdomyolysis and rupture of the muscled. Negative xray. Tracheal aspirate MRSA positive. Weaned off oxygen. On linezolid to complete 7 days of therapy. ON IV dexamethasone for 7 days of therapy. CK elevated but improving. IVF discontinued. Initially on tube feeds and seen by SLP. Discontinued and now on dysphagia diet. Lovenox for DVT prophylaxis.    Patient's medical record from [facility] has been reviewed by the rehabilitation admission coordinator and physician.   Past Medical History      Past Medical History:  Diagnosis Date   Asthma     Cocaine use complicating pregnancy  in third trimester 08/30/2017   Endometriosis     Ovarian cyst     Seizures (HCC)     Status post cesarean section 08/30/2017        Has the patient had major surgery during 100 days prior to admission? No   Family History   family history is not on file.   Current Medications  Current Medications    Current Facility-Administered Medications:    0.9 %  sodium chloride infusion, 250 mL, Intravenous, Continuous, Cheri Fowler, MD, Stopped at 02/24/23 1217   acetaminophen (TYLENOL) 160 MG/5ML solution 650 mg, 650 mg, Per Tube, Q6H PRN, Luciano Cutter, MD, 650 mg at 03/01/23 2306   Chlorhexidine Gluconate Cloth 2 % PADS 6 each, 6 each, Topical, Daily, Icard, Bradley L, DO, 6 each at  03/03/23 0927   docusate (COLACE) 50 MG/5ML liquid 100 mg, 100 mg, Per Tube, BID PRN, Simonne Martinet, NP   docusate (COLACE) 50 MG/5ML liquid 100 mg, 100 mg, Per Tube, BID, Simonne Martinet, NP, 100 mg at 02/28/23 2127   enoxaparin (LOVENOX) injection 40 mg, 40 mg, Subcutaneous, Q24H, Wouk, Wilfred Curtis, MD, 40 mg at 03/03/23 1610   Gerhardt's butt cream, , Topical, Daily, Luciano Cutter, MD, Given at 03/02/23 1024   guaiFENesin-dextromethorphan (ROBITUSSIN DM) 100-10 MG/5ML syrup 10 mL, 10 mL, Oral, Q6H PRN, Dorcas Carrow, MD, 10 mL at 03/02/23 2236   ipratropium-albuterol (DUONEB) 0.5-2.5 (3) MG/3ML nebulizer solution 3 mL, 3 mL, Nebulization, Q6H PRN, Wouk, Wilfred Curtis, MD   lamoTRIgine (LAMICTAL) tablet 150 mg, 150 mg, Per Tube, BID, Milon Dikes, MD, 150 mg at 03/03/23 0926   levETIRAcetam (KEPPRA) tablet 1,000 mg, 1,000 mg, Per Tube, BID, Francena Hanly, RPH, 1,000 mg at 03/03/23 9604   linezolid (ZYVOX) tablet 600 mg, 600 mg, Oral, Q12H, Wouk, Wilfred Curtis, MD, 600 mg at 03/03/23 0926   polyethylene glycol (MIRALAX / GLYCOLAX) packet 17 g, 17 g, Per Tube, Daily PRN, Simonne Martinet, NP   polyethylene glycol (MIRALAX / GLYCOLAX) packet 17 g, 17 g, Per Tube, Daily, Zenia Resides E, NP, 17 g at 02/25/23 0941   QUEtiapine (SEROQUEL) tablet 100 mg, 100 mg, Per Tube, QHS, Luciano Cutter, MD, 100 mg at 03/02/23 2243   QUEtiapine (SEROQUEL) tablet 50 mg, 50 mg, Per Tube, Q1400, Luciano Cutter, MD, 50 mg at 03/03/23 5409     Patients Current Diet:  Diet Order                  DIET DYS 2 Room service appropriate? Yes; Fluid consistency: Honey Thick  Diet effective now                       Precautions / Restrictions Precautions Precautions: Fall, Other (comment) Precaution Comments: watch sats and HR Restrictions Weight Bearing Restrictions: No  COVID isolation and MRSA isolation   Has the patient had 2 or more falls or a fall with injury in the past year? No    Prior Activity Level Community (5-7x/wk): works, independent   Prior Functional Level Self Care: Did the patient need help bathing, dressing, using the toilet or eating? Independent   Indoor Mobility: Did the patient need assistance with walking from room to room (with or without device)? Independent   Stairs: Did the patient need assistance with internal or external stairs (with or without device)? Independent   Functional Cognition: Did the patient need help planning regular tasks such as  shopping or remembering to take medications? Independent   Patient Information Are you of Hispanic, Latino/a,or Spanish origin?: A. No, not of Hispanic, Latino/a, or Spanish origin What is your race?: A. White Do you need or want an interpreter to communicate with a doctor or health care staff?: 0. No   Patient's Response To:  Health Literacy and Transportation Is the patient able to respond to health literacy and transportation needs?: Yes Health Literacy - How often do you need to have someone help you when you read instructions, pamphlets, or other written material from your doctor or pharmacy?: Never In the past 12 months, has lack of transportation kept you from medical appointments or from getting medications?: No In the past 12 months, has lack of transportation kept you from meetings, work, or from getting things needed for daily living?: No   Home Assistive Devices / Equipment Home Equipment: None   Prior Device Use: Indicate devices/aids used by the patient prior to current illness, exacerbation or injury? None of the above   Current Functional Level Cognition   Arousal/Alertness: Awake/alert Overall Cognitive Status: Impaired/Different from baseline Current Attention Level: Focused Orientation Level: Oriented X4 Following Commands: Follows one step commands consistently Safety/Judgement: Decreased awareness of safety, Decreased awareness of deficits General Comments: pt calm and  appropriate throughout session today, still with decreased attention to self during mobility though which decreases her safety Attention: Sustained Sustained Attention: Impaired Sustained Attention Impairment: Verbal basic, Functional basic Memory: Appears intact Awareness: Appears intact Problem Solving: Impaired Problem Solving Impairment: Verbal basic, Functional basic    Extremity Assessment (includes Sensation/Coordination)   Upper Extremity Assessment: Right hand dominant, LUE deficits/detail, RUE deficits/detail RUE Deficits / Details: A/ROM WFL in all ranges. 5/5 shoulder, elbow strength. Functional gross grasp. LUE Deficits / Details: A/ROM WFL in all ranges. 4-/5 strength shoulder flexion/abduction, elbow extension, functional although decreased gross grasp  Lower Extremity Assessment: Generalized weakness, Defer to PT evaluation     ADLs   General ADL Comments: pt requires SBA - Min A to complete Bathing, dressing, and grooming tasks while primarily sitting although able to stand when necessary. Currently utilizing Purewick for toileting although reports that she can tell when she needs to go; she just has been going constantly.     Mobility   Overal bed mobility: Needs Assistance Bed Mobility: Supine to Sit, Sit to Supine Supine to sit: Min assist, Used rails Sit to supine: Supervision General bed mobility comments: Pt reached for OT to assist with trunk, while OT initiated pt to roll, she was then able to do so with less assistance. Supine to sit completed with HOB flat.     Transfers   Overall transfer level: Needs assistance Equipment used: None Transfers: Sit to/from Stand, Bed to chair/wheelchair/BSC Sit to Stand: Supervision Bed to/from chair/wheelchair/BSC transfer type:: Step pivot Stand pivot transfers: Mod assist Step pivot transfers: Contact guard assist General transfer comment: pt stood without physical assist but had 1 LOB when standing from toilet      Ambulation / Gait / Stairs / Wheelchair Mobility   Ambulation/Gait Ambulation/Gait assistance: Min assist, +2 safety/equipment Gait Distance (Feet): 40 Feet (10', 30') Assistive device: Rolling walker (2 wheels) Gait Pattern/deviations: Decreased stride length, Wide base of support, Trunk flexed, Drifts right/left General Gait Details: ambulated into bathroom with HHA, tends to keep neck flexed looking down, vc's to scan forward. Then ambulated 30' with RW and min A. Mildly unsteady with turns and does not attend well to self or environment  Gait velocity: decreased Gait velocity interpretation: 1.31 - 2.62 ft/sec, indicative of limited community ambulator     Posture / Balance Dynamic Sitting Balance Sitting balance - Comments: EOB completing sock management Balance Overall balance assessment: Needs assistance Sitting-balance support: No upper extremity supported, Feet supported Sitting balance-Leahy Scale: Normal Sitting balance - Comments: EOB completing sock management Standing balance support: Bilateral upper extremity supported Standing balance-Leahy Scale: Poor Standing balance comment: Relied on external support when standing. Able to complete partial squat from sitting EOB to reach for cup on tray table that was moved away from bed without LOB.     Special needs/care consideration COVID isolation. Positive on 9/4 Fall transferring to [facility] 9/9 Fall precautions    Previous Home Environment  Living Arrangements:  (Mom, step dad, 5 yo Physicist, medical, and her sister)  Lives With: Family, Daughter Available Help at Discharge:  (Mom confirms 24/7 assist) Type of Home: House Home Layout: Two level, Able to live on main level with bedroom/bathroom Alternate Level Stairs-Number of Steps: flight Home Access: Stairs to enter Entrance Stairs-Rails: Right, Left, Can reach both Entrance Stairs-Number of Steps: 3-4 Bathroom Shower/Tub: Engineer, manufacturing systems: Standard Bathroom  Accessibility: Yes How Accessible: Accessible via walker Home Care Services: No Additional Comments: Mom clarified all   Discharge Living Setting Plans for Discharge Living Setting: Patient's home, Lives with (comment) (Mom , stepdad, 5 yo dtr and 49 yo sister) Type of Home at Discharge: House Discharge Home Layout: Two level, Able to live on main level with bedroom/bathroom Alternate Level Stairs-Number of Steps: flight Discharge Home Access: Stairs to enter Entrance Stairs-Rails: Right, Left, Can reach both Entrance Stairs-Number of Steps: 3-4 Discharge Bathroom Shower/Tub: Tub/shower unit Discharge Bathroom Toilet: Standard Discharge Bathroom Accessibility: Yes How Accessible: Accessible via walker Does the patient have any problems obtaining your medications?: No   Social/Family/Support Systems Patient Roles: Parent Contact Information: Vance Gather Anticipated Caregiver: Mom Anticipated Caregiver's Contact Information: see contacts Ability/Limitations of Caregiver: none Caregiver Availability: 24/7 Discharge Plan Discussed with Primary Caregiver: Yes Is Caregiver In Agreement with Plan?: Yes Does Caregiver/Family have Issues with Lodging/Transportation while Pt is in Rehab?: No   Goals Patient/Family Goal for Rehab: Mod I to superivsion with PT, OT and SLP Expected length of stay: ELOS 10 to 12 days Pt/Family Agrees to Admission and willing to participate: Yes Program Orientation Provided & Reviewed with Pt/Caregiver Including Roles  & Responsibilities: Yes   Decrease burden of Care through IP rehab admission: n/a   Possible need for SNF placement upon discharge: not anticipated   Patient Condition: I have reviewed medical records from [facility], spoken with patient and family member. I met with patient at the bedside and discussed via phone for inpatient rehabilitation assessment.  Patient will benefit from ongoing PT, OT, and SLP, can actively participate in  3 hours of therapy a day 5 days of the week, and can make measurable gains during the admission.  Patient will also benefit from the coordinated team approach during an Inpatient Acute Rehabilitation admission.  The patient will receive intensive therapy as well as Rehabilitation physician, nursing, social worker, and care management interventions.  Due to bladder management, bowel management, safety, skin/wound care, disease management, medication administration, pain management, and patient education the patient requires 24 hour a day rehabilitation nursing.  The patient is currently min assist overall with mobility and basic ADLs.  Discharge setting and therapy post discharge at home with home health is anticipated.  Patient has agreed to participate  in the Acute Inpatient Rehabilitation Program and will admit today.   Preadmission Screen Completed By:  Clois Dupes, RN MSN 03/03/2023 1:51 PM ______________________________________________________________________   Discussed status with Dr. Natale Lay on 03/03/23 at 1352 and received approval for admission today.   Admission Coordinator:  Clois Dupes, RN MSN time [id number] Date 03/03/23    Assessment/Plan: Diagnosis: debility due to Status epilepticus with rhabdomyolysis and COVID Does the need for close, 24 hr/day Medical supervision in concert with the patient's rehab needs make it unreasonable for this patient to be served in a less intensive setting? Yes Co-Morbidities requiring supervision/potential complications: AKI, Rhabdomyolysis, delirum, left thigh pain, Covid pneumonia, aspiration pneumonia, MRSA pneumonia, dysphagia, demand ischemia Due to bladder management, bowel management, safety, skin/wound care, disease management, medication administration, pain management, and patient education, does the patient require 24 hr/day rehab nursing? Yes Does the patient require coordinated care of a physician, rehab nurse, PT, OT, and  SLP to address physical and functional deficits in the context of the above medical diagnosis(es)? Yes Addressing deficits in the following areas: balance, endurance, locomotion, strength, transferring, bowel/bladder control, bathing, dressing, feeding, grooming, toileting, cognition, speech, language, swallowing, and psychosocial support Can the patient actively participate in an intensive therapy program of at least 3 hrs of therapy 5 days a week? Yes The potential for patient to make measurable gains while on inpatient rehab is excellent Anticipated functional outcomes upon discharge from inpatient rehab: modified independent PT, modified independent OT, modified independent SLP Estimated rehab length of stay to reach the above functional goals is: 10-12 Anticipated discharge destination: Home 10. Overall Rehab/Functional Prognosis: excellent     MD Signature: Fanny Dance          Revision History

## 2023-03-03 NOTE — TOC Transition Note (Signed)
Transition of Care (TOC) - CM/SW Discharge Note Donn Pierini RN,BSN Transitions of Care Unit 4NP (Non Trauma)- RN Case Manager See Treatment Team for direct Phone #   Patient Details  Name: GERA STATER MRN: [id number] Date of Birth: [birth year]  Transition of Care [facility]) CM/SW Contact:  Darrold Span, RN Phone Number: 03/03/2023, 3:16 PM   Clinical Narrative:    CM notified by CIR liaison that pt has insurance auth and bed available today for transition to INPT rehab.  MD has cleared pt for transition today- pt to discharge to [facility] INPT rehab this afternoon.   No further TOC needs noted.    Final next level of care: IP Rehab Facility Barriers to Discharge: Barriers Resolved   Patient Goals and CMS Choice CMS Medicare.gov Compare Post Acute Care list provided to:: Patient Choice offered to / list presented to : Patient  Discharge Placement                 [facility] INPT rehab        Discharge Plan and Services Additional resources added to the After Visit Summary for   In-house Referral: NA Discharge Planning Services: CM Consult Post Acute Care Choice: IP Rehab          DME Arranged: N/A DME Agency: NA       HH Arranged: NA HH Agency: NA        Social Determinants of Health (SDOH) Interventions SDOH Screenings   Tobacco Use: High Risk (12/17/2022)     Readmission Risk Interventions    03/03/2023    3:16 PM  Readmission Risk Prevention Plan  Medication Screening Complete  Transportation Screening Complete

## 2023-03-03 NOTE — Progress Notes (Signed)
Physical Therapy Treatment Patient Details Name: Colleen Pearson MRN: [id number] DOB: 1991/02/07 Today's Date: 03/03/2023   History of Present Illness 32 yo female admitted 9/2 with prolonged seizure requiring intubation and Keppra. Intubated (9/2-9/3, 9/4-9/6). 9/4 Covid (+). PMHx: epilepsy    PT Comments  Pt was able to make progress in regards to endurance and balance, ambulating up to ~160 ft today, progressing from utilizing a RW to no UE support. However, she remains at high risk for falls, demonstrating a tendency to sway laterally and partially buckle at her L knee. Per chart, pt fell while transferring to [facility] a couple days ago. Upon arrival, pt was lying supine in bed and reported pain at her L knee and hands "because I fell a minute ago". Pt reporting she tried to take herself to the [facility] without assistance or the RW when she fell earlier. She reported "I told a man and he was supposed to tell my nurse, but I don't think he did". Unsure if this accurate or pt recalling her reported fall going to the [facility] the other day per chart. Pt did not recall falling the other day when asked. Assessed pt's hands and L knee as these were areas she reported pain. Erythema and bruising noted at these regions but overall ROM WFL and able to bear weight at these areas without issue. Notified RN of pt's report. Will continue to follow acutely.     If plan is discharge home, recommend the following: Assistance with cooking/housework;Direct supervision/assist for medications management;Assist for transportation;Supervision due to cognitive status;Direct supervision/assist for financial management;Help with stairs or ramp for entrance;A little help with walking and/or transfers;A little help with bathing/dressing/bathroom   Can travel by private vehicle        Equipment Recommendations  Rolling walker (2 wheels);BSC/3in1    Recommendations for Other Services       Precautions / Restrictions  Precautions Precautions: Fall;Other (comment) Precaution Comments: watch sats and HR Restrictions Weight Bearing Restrictions: No     Mobility  Bed Mobility Overal bed mobility: Needs Assistance Bed Mobility: Supine to Sit, Sit to Supine     Supine to sit: Supervision, HOB elevated Sit to supine: Supervision, HOB elevated   General bed mobility comments: supervision for safety    Transfers Overall transfer level: Needs assistance Equipment used: Rolling walker (2 wheels), None Transfers: Sit to/from Stand Sit to Stand: Contact guard assist, Min assist           General transfer comment: Cues for hand placement, CGA for safety coming to stand to RW 1x, up to minA for balance coming to stand x10 reps without UE support with R foot anteriorly placed to L    Ambulation/Gait Ambulation/Gait assistance: Min assist Gait Distance (Feet): 160 Feet Assistive device: Rolling walker (2 wheels), None Gait Pattern/deviations: Decreased stride length, Trunk flexed, Drifts right/left, Step-through pattern, Narrow base of support, Knees buckling Gait velocity: decreased Gait velocity interpretation: <1.8 ft/sec, indicate of risk for recurrent falls   General Gait Details: Pt ambulated initially with the RW with CGA-minA for balance. Quickly progressed to no UE support with pt ambulating slower with smaller, more narrow steps and intermittently reaching out for the walls or furniture for support. MinA more consistently for balance as pt was noted to sway laterally and slightly buckle at the L knee. Cues provided to widen BOS and longer steps.   Stairs             Psychologist, prison and probation services  Tilt Bed    Modified Rankin (Stroke Patients Only)       Balance Overall balance assessment: Needs assistance Sitting-balance support: No upper extremity supported, Feet supported Sitting balance-Leahy Scale: Good     Standing balance support: Single extremity supported, Bilateral  upper extremity supported, No upper extremity supported, During functional activity Standing balance-Leahy Scale: Poor Standing balance comment: Reliant on minA to prevent LOB without UE support, benefits from RW.                            Cognition Arousal: Alert Behavior During Therapy: WFL for tasks assessed/performed Overall Cognitive Status: Impaired/Different from baseline Area of Impairment: Attention, Memory, Following commands, Safety/judgement, Problem solving, Awareness                   Current Attention Level: Selective Memory: Decreased short-term memory Following Commands: Follows one step commands consistently, Follows multi-step commands with increased time, Follows multi-step commands consistently Safety/Judgement: Decreased awareness of safety, Decreased awareness of deficits Awareness: Emergent Problem Solving: Slow processing, Requires verbal cues, Requires tactile cues General Comments: pt calm and appropriate throughout session today. Pt reporting she fell earlier before the PT arrived when she tried to take herself to the [facility] without assistance or the RW. She was supine in bed upon PT arrival. She reported "I told a man and he was supposed to tell my nurse, but I don't think he did". Unsure if this accurate or pt recalling her reported fall going to the [facility] the other day per chart. Pt did not recall falling a the other day when asked. Notified RN regardless and assessed pt's hands and L knee as these were areas she reported pain. Erythema and bruising noted at these regions but overall ROM WFL and able to bear weight at these areas without issue. Notified RN.        Exercises Other Exercises Other Exercises: sit <> stand 10x without UE support with R foot anterior to the L to facilitate L leg strengthening, minA for balance.    General Comments General comments (skin integrity, edema, etc.): Pt reporting she fell earlier before the PT arrived when  she tried to take herself to the [facility] without assistance or the RW. She was supine in bed upon PT arrival. She reported "I told a man and he was supposed to tell my nurse, but I don't think he did". Unsure if this accurate or pt recalling her reported fall going to the [facility] the other day per chart. Pt did not recall falling a the other day when asked. Notified RN regardless and assessed pt's hands and L knee as these were areas she reported pain. Erythema and bruising noted at these regions but overall ROM WFL and able to bear weight at these areas without issue. Notified RN.      Pertinent Vitals/Pain Pain Assessment Pain Assessment: Faces Faces Pain Scale: Hurts little more Pain Location: L knee, R hand Pain Descriptors / Indicators: Discomfort, Guarding, Grimacing Pain Intervention(s): Limited activity within patient's tolerance, Monitored during session, Repositioned    Home Living                          Prior Function            PT Goals (current goals can now be found in the care plan section) Acute Rehab PT Goals Patient Stated Goal: return home with mom PT  Goal Formulation: With patient Time For Goal Achievement: 03/14/23 Potential to Achieve Goals: Good Progress towards PT goals: Progressing toward goals    Frequency    Min 1X/week      PT Plan      Co-evaluation              AM-PAC PT "6 Clicks" Mobility   Outcome Measure  Help needed turning from your back to your side while in a flat bed without using bedrails?: A Little Help needed moving from lying on your back to sitting on the side of a flat bed without using bedrails?: A Little Help needed moving to and from a bed to a chair (including a wheelchair)?: A Little Help needed standing up from a chair using your arms (e.g., wheelchair or bedside chair)?: A Little Help needed to walk in hospital room?: A Little Help needed climbing 3-5 steps with a railing? : A Lot 6 Click Score: 17    End  of Session Equipment Utilized During Treatment: Gait belt Activity Tolerance: Patient tolerated treatment well Patient left: with call bell/phone within reach;in bed;with bed alarm set Nurse Communication: Mobility status;Other (comment) (pt reports she fell earlier) PT Visit Diagnosis: Other abnormalities of gait and mobility (R26.89);Muscle weakness (generalized) (M62.81);Unsteadiness on feet (R26.81);Other symptoms and signs involving the nervous system (R29.898)     Time: 1610-9604 PT Time Calculation (min) (ACUTE ONLY): 19 min  Charges:    $Gait Training: 8-22 mins PT General Charges $$ ACUTE PT VISIT: 1 Visit                     Virgil Benedict, PT, DPT Acute Rehabilitation Services  Office: [phone]    Bettina Gavia 03/03/2023, 4:38 PM

## 2023-03-03 NOTE — Discharge Summary (Signed)
Physician Discharge Summary  Colleen Pearson NFA:[id number] DOB: Aug 01, 1990 DOA: 02/22/2023  PCP: Morene Crocker, MD  Admit date: 02/22/2023 Discharge date: 03/03/2023  Admitted From: Home Disposition: Acute inpatient rehab  Recommendations for Outpatient Follow-up:  Schedule follow-up with neurology on discharge from rehab.  Home Health: N/A Equipment/Devices: N/A  Discharge Condition: Stable CODE STATUS: Full code Diet recommendation: Mechanical soft diet, honey thick liquids  Discharge summary: This is a 32 year old female patient with a history of epilepsy, for which she takes Keppra 1000 mg twice daily, last breakthrough seizure noted in June 2024. Presented to the hospital via EMS after family contacted them for prolonged seizure. Reportedly seizure lasted for approximately 45 minutes. She was still in status epilepticus on EMS arrival requiring multiple doses of midazolam prehospital. On initial evaluation by EDP patient was unresponsive, requiring assisted ventilation via mask. She was intubated, loaded with IV Keppra seen by neurology, CT head was negative, additionally received a total of 4 L of crystalloid for intermittent hypotension. UDS positive for THC.   32 year old female patient admitted in status epilepticus intubated in the emergency room, received 4 L of crystalloid for intermittent hypotension.  Loaded with Keppra, , started on ceftriaxone for possible aspiration 9/2  EEG suggesting focal seizure episode  Extubated on 9/3 Re intubated 9/4. COVID positive.  Extubated 9/6 Medically stable 9/11.   Assessment & Plan:   # Status epilepticus, probably aggravated by COVID-19 infection and THC use. Resolved,neuro has signed off - continue current anti-epileptics kappra and lamictal.  Neurology did not recommend increasing dose of home medications.  She is on Keppra 1000 mg twice daily and Lamictal 150 mg twice daily.   # Delirium.  Developed significant delirium in  the ICU.  Improved.  Previously on Precedex.  Currently on scheduled Seroquel 50 in the morning, 100 in the evening.  With her clinical recovery, will continue Seroquel 50 mg at night and can gradually be tapered off at the rehab.   # Left thigh pain/swelling.  Traumatic rhabdomyolysis.  Likely rupture of the muscles. - negative x ray.  Symptomatic treatment.  Some clinical improvement today.  Continue mobility.   # Aspiration pneumonia # MRSA pneumnonia # Covid pneumonia Trachial aspirate mrsa positive. Now weaned off o2.  Clinically improved. - on Vancomycin - now on linezolid.  Will complete linezolid therapy today.   -Patient was on steroids, discontinued. -Patient is more than 1 week with diagnosis of COVID-19, she can come off airborne isolation.  Will suggest standard precautions.   # AKI 2/2 rhabdo, resolved   # Rhabdomyolysis 2/2 seizure. Ck remains elevated but improving, k and cr are normal -With normal renal functions and improved oral intake.  IV fluids discontinued.   # Debility  # Dysphagia Significantly debilitated.  Benefits with acute inpatient rehab before going home. Previously on tube feeding.  Now tolerating mechanical soft diet and honey thick liquids.  Continues to have recovery.  She will be seen by speech therapy in the rehab.   # Demand Ischemia : Type II myocardial injury due to infection. Troponin elevated but flat . No chest pain. Echo without any wall motion abnormalities.   Medically improving.  Stable to transfer to acute inpatient rehab.   Discharge Diagnoses:  Principal Problem:   Status epilepticus (HCC) Active Problems:   Aspiration pneumonia of both lower lobes (HCC)   Seizure (HCC)   Pneumonia due to COVID-19 virus   Rhabdomyolysis    Discharge Instructions  Discharge Instructions  Diet general   Complete by: As directed    Dysphagia 3( mechanical soft) diet with honey thick liquids   Increase activity slowly   Complete by: As  directed       Allergies as of 03/03/2023       Reactions   Tramadol Hives, Swelling        Medication List     TAKE these medications    guaiFENesin-dextromethorphan 100-10 MG/5ML syrup Commonly known as: ROBITUSSIN DM Take 10 mLs by mouth every 6 (six) hours as needed for cough.   lamoTRIgine 150 MG tablet Commonly known as: LAMICTAL Take 150 mg by mouth 2 (two) times daily.   levETIRAcetam 1000 MG tablet Commonly known as: Keppra Take 1 tablet (1,000 mg total) by mouth 2 (two) times daily.   levocetirizine 5 MG tablet Commonly known as: XYZAL Take 5 mg by mouth at bedtime as needed for allergies.   Nexplanon 68 MG Impl implant Generic drug: etonogestrel 1 each (68 mg total) by Subdermal route once for 1 dose.   QUEtiapine 50 MG tablet Commonly known as: SEROQUEL Place 1 tablet (50 mg total) into feeding tube at bedtime.        Allergies  Allergen Reactions   Tramadol Hives and Swelling    Consultations: Critical care Neurology   Procedures/Studies: VAS [place] LOWER EXTREMITY VENOUS (DVT)  Result Date: 03/02/2023  Lower Venous DVT Study Patient Name:  [facility]  Date of Exam:   03/02/2023 Medical Rec #: [id number]           Accession #:    [id number] Date of Birth: Sep 04, 1990           Patient Gender: F Patient Age:   12 years Exam Location:  [facility] Procedure:      VAS [place] LOWER EXTREMITY VENOUS (DVT) Referring Phys: Shonna Chock --------------------------------------------------------------------------------  Indications: Left thigh swelling in setting of rhabdo. Other Indications: Covid-19. Comparison Study: No prior studies. Performing Technologist: Jean Rosenthal RDMS, RVT  Examination Guidelines: A complete evaluation includes B-mode imaging, spectral Doppler, color Doppler, and power Doppler as needed of all accessible portions of each vessel. Bilateral testing is considered an integral part of a complete examination. Limited examinations for  reoccurring indications may be performed as noted. The reflux portion of the exam is performed with the patient in reverse Trendelenburg.  +-----+---------------+---------+-----------+----------+--------------+ RIGHTCompressibilityPhasicitySpontaneityPropertiesThrombus Aging +-----+---------------+---------+-----------+----------+--------------+ CFV  Full           Yes      Yes                                 +-----+---------------+---------+-----------+----------+--------------+   +---------+---------------+---------+-----------+----------+--------------+ LEFT     CompressibilityPhasicitySpontaneityPropertiesThrombus Aging +---------+---------------+---------+-----------+----------+--------------+ CFV      Full           Yes      Yes                                 +---------+---------------+---------+-----------+----------+--------------+ SFJ      Full                                                        +---------+---------------+---------+-----------+----------+--------------+ FV Prox  Full                                                        +---------+---------------+---------+-----------+----------+--------------+  FV Mid   Full                                                        +---------+---------------+---------+-----------+----------+--------------+ FV DistalFull           Yes      Yes                                 +---------+---------------+---------+-----------+----------+--------------+ PFV      Full                                                        +---------+---------------+---------+-----------+----------+--------------+ POP      Full           Yes      Yes                                 +---------+---------------+---------+-----------+----------+--------------+ PTV      Full                                                        +---------+---------------+---------+-----------+----------+--------------+  PERO     Full                                                        +---------+---------------+---------+-----------+----------+--------------+     Summary: RIGHT: - No evidence of common femoral vein obstruction.  LEFT: - There is no evidence of deep vein thrombosis in the lower extremity.  - No cystic structure found in the popliteal fossa.  *See table(s) above for measurements and observations. Electronically signed by Lemar Livings MD on 03/02/2023 at 5:40:15 PM.    Final    ECHOCARDIOGRAM COMPLETE  Result Date: 03/02/2023    ECHOCARDIOGRAM REPORT   Patient Name:   Colleen Pearson [facility] Date of Exam: 03/02/2023 Medical Rec #:  [id number]          Height:       61.0 in Accession #:    [id number]         Weight:       157.6 lb Date of Birth:  02-08-1991          BSA:          1.707 m Patient Age:    32 years           BP:           130/82 mmHg Patient Gender: F                  HR:           98 bpm. Exam Location:  Inpatient Procedure: 2D Echo, Color Doppler and Cardiac Doppler Indications:    Elevated  Troponin  History:        Patient has no prior history of Echocardiogram examinations.                 Seizure; Risk Factors:Current Smoker. Substance Abuse.  Sonographer:    Aron Baba Referring Phys: [id number] NOAH BEDFORD JXBJ  Sonographer Comments: Image acquisition challenging due to respiratory motion. IMPRESSIONS  1. Left ventricular ejection fraction, by estimation, is 50 to 55%. The left ventricle has low normal function. The left ventricle has no regional wall motion abnormalities. Left ventricular diastolic parameters were normal.  2. Right ventricular systolic function is normal. The right ventricular size is normal. There is normal pulmonary artery systolic pressure. The estimated right ventricular systolic pressure is 11.5 mmHg.  3. The mitral valve is grossly normal. Trivial mitral valve regurgitation. No evidence of mitral stenosis.  4. The aortic valve is grossly normal. Aortic valve  regurgitation is not visualized. No aortic stenosis is present.  5. The inferior vena cava is normal in size with greater than 50% respiratory variability, suggesting right atrial pressure of 3 mmHg. FINDINGS  Left Ventricle: Left ventricular ejection fraction, by estimation, is 50 to 55%. The left ventricle has low normal function. The left ventricle has no regional wall motion abnormalities. The left ventricular internal cavity size was normal in size. There is no left ventricular hypertrophy. Left ventricular diastolic parameters were normal. Right Ventricle: The right ventricular size is normal. No increase in right ventricular wall thickness. Right ventricular systolic function is normal. There is normal pulmonary artery systolic pressure. The tricuspid regurgitant velocity is 1.46 m/s, and  with an assumed right atrial pressure of 3 mmHg, the estimated right ventricular systolic pressure is 11.5 mmHg. Left Atrium: Left atrial size was normal in size. Right Atrium: Right atrial size was normal in size. Pericardium: There is no evidence of pericardial effusion. Mitral Valve: The mitral valve is grossly normal. Trivial mitral valve regurgitation. No evidence of mitral valve stenosis. Tricuspid Valve: The tricuspid valve is grossly normal. Tricuspid valve regurgitation is trivial. No evidence of tricuspid stenosis. Aortic Valve: The aortic valve is grossly normal. Aortic valve regurgitation is not visualized. No aortic stenosis is present. Pulmonic Valve: The pulmonic valve was grossly normal. Pulmonic valve regurgitation is not visualized. No evidence of pulmonic stenosis. Aorta: The aortic root and ascending aorta are structurally normal, with no evidence of dilitation. Venous: The inferior vena cava is normal in size with greater than 50% respiratory variability, suggesting right atrial pressure of 3 mmHg. IAS/Shunts: The atrial septum is grossly normal.  LEFT VENTRICLE PLAX 2D LVIDd:         4.00 cm    Diastology LVIDs:         3.00 cm   LV e' medial:    8.05 cm/s LV PW:         0.90 cm   LV E/e' medial:  13.3 LV IVS:        0.70 cm   LV e' lateral:   11.00 cm/s LVOT diam:     1.40 cm   LV E/e' lateral: 9.7 LV SV:         29 LV SV Index:   17 LVOT Area:     1.54 cm  RIGHT VENTRICLE RV S prime:     16.30 cm/s TAPSE (M-mode): 1.5 cm LEFT ATRIUM             Index        RIGHT ATRIUM  Index LA diam:        2.60 cm 1.52 cm/m   RA Area:     10.10 cm LA Vol (A2C):   25.5 ml 14.94 ml/m  RA Volume:   19.30 ml  11.31 ml/m LA Vol (A4C):   22.9 ml 13.42 ml/m LA Biplane Vol: 24.9 ml 14.59 ml/m  AORTIC VALVE LVOT Vmax:   130.00 cm/s LVOT Vmean:  83.200 cm/s LVOT VTI:    0.186 m  AORTA Ao Root diam: 2.60 cm Ao Asc diam:  2.50 cm MITRAL VALVE                TRICUSPID VALVE MV Area (PHT): 5.42 cm     TR Peak grad:   8.5 mmHg MV Decel Time: 140 msec     TR Vmax:        146.00 cm/s MR Peak grad: 7.1 mmHg MR Vmax:      133.00 cm/s   SHUNTS MV E velocity: 107.00 cm/s  Systemic VTI:  0.19 m MV A velocity: 75.30 cm/s   Systemic Diam: 1.40 cm MV E/A ratio:  1.42 Lennie Odor MD Electronically signed by Lennie Odor MD Signature Date/Time: 03/02/2023/11:15:54 AM    Final    DG HIP PORT UNILAT WITH PELVIS 1V LEFT  Result Date: 03/01/2023 CLINICAL DATA:  Left hip pain following fall due to seizure activity. EXAM: DG HIP (WITH OR WITHOUT PELVIS) 1V PORT LEFT COMPARISON:  01/24/2015. FINDINGS: There is no evidence of acute hip fracture or dislocation. A density is noted over the left hip on AP view which is likely external to the patient and not seen on additional view. A density is noted in the soft tissues lateral to the proximal left femoral shaft, which may also be external to the patient. There is no evidence of arthropathy or other focal bone abnormality. IMPRESSION: 1. No acute osseous abnormality. 2. Density overlying the left hip on one view and proximal left thigh on the lateral view may be external to the  patient. Clinical correlation is recommended and repeat imaging may be obtained if clinically warranted. Electronically Signed   By: Thornell Sartorius M.D.   On: 03/01/2023 20:25   DG Swallowing Func-Speech Pathology  Result Date: 03/01/2023 Table formatting from the original result was not included. Modified Barium Swallow Study Patient Details Name: Colleen Pearson MRN: [id number] Date of Birth: 1990/11/19 Today's Date: 03/01/2023 HPI/PMH: HPI: Colleen Pearson is a 32 yo female presenting to ED 9/2 from home after a prolonged seizure (lasted at least 45 minutes continuously). Intubated upon EMS arrival 9/2-9/3, reintubated 9/4-9/6. Cortrak placed 9/4.  CTH 9/2 unremarkable. CXR 9/3 with new increased hazy opacities in lower lung fields and mid perihilar areas. PMH includes asthma, THC and cocaine use, endometriosis, ovarian cyst, seizures Clinical Impression: Clinical Impression: Pt presents with a moderate oropharyngeal dysphagia characterized by impaired timing and sensation. Pt with poor bolus control resulting in posterior progression of less than half of thin liquid boluses to the level of the valleculae before being cued to swallow. This appears to happen frequently even in spontaneous sips with boluses reaching the level of the posterior surface of the epiglottis prior to initiation of laryngeal vestibule closure. This results in consistent penetration of liquid boluses, with thin liquids and nectar thick liquids initially progressing to the level of the vocal folds without sensation and subsequently resulting in trace silent aspiration (PAS 8) during cued cough attempts. This was improved although not completely resolved by a  cued chin tuck, resulting in penetration remaining above the vocal folds (PAS 3) with thin liquids and nectar thick liquids. Question pt's ability to consistently use these compensatory strategies without full supervision to cue due to her impulisivity and cognition. No further  penetration/aspiration noted with trials of honey thick liquids, purees, or solids. Pt presents with a strong cough, although her laryngeal sensation appears to be impaired, which may be representative of ETT trauma. Given pt's cognition and respiratory status, recommend initiating diet of Dys 2 textures with honey thick liquids and full supervision to cue pt to clear her throat intermittently. Recommend meds be given crushed in puree. Feel that she shows great potential to upgrade diet given intensive SLP f/u to ensure consistent use of compensatory strategies. Will continue to follow. Factors that may increase risk of adverse event in presence of aspiration Rubye Oaks & Clearance Coots 2021): Factors that may increase risk of adverse event in presence of aspiration Rubye Oaks & Clearance Coots 2021): Presence of tubes (ETT, trach, NG, etc.); Aspiration of thick, dense, and/or acidic materials Recommendations/Plan: Swallowing Evaluation Recommendations Swallowing Evaluation Recommendations Recommendations: PO diet PO Diet Recommendation: Moderately thick liquids (Level 3, honey thick); Dysphagia 2 (Finely chopped) Liquid Administration via: Spoon; Cup Medication Administration: Crushed with puree Supervision: Patient able to self-feed; Full supervision/cueing for swallowing strategies Swallowing strategies  : Minimize environmental distractions; Slow rate; Small bites/sips; Clear throat intermittently Postural changes: Position pt fully upright for meals; Stay upright 30-60 min after meals Oral care recommendations: Oral care BID (2x/day) Treatment Plan Treatment Plan Treatment recommendations: Therapy as outlined in treatment plan below Follow-up recommendations: Acute inpatient rehab (3 hours/day) Functional status assessment: Patient has had a recent decline in their functional status and demonstrates the ability to make significant improvements in function in a reasonable and predictable amount of time. Treatment frequency: Min  2x/week Treatment duration: 2 weeks Interventions: Aspiration precaution training; Compensatory techniques; Patient/family education; Trials of upgraded texture/liquids; Diet toleration management by SLP Recommendations Recommendations for follow up therapy are one component of a multi-disciplinary discharge planning process, led by the attending physician.  Recommendations may be updated based on patient status, additional functional criteria and insurance authorization. Assessment: Orofacial Exam: Orofacial Exam Oral Cavity: Oral Hygiene: WFL Oral Cavity - Dentition: Adequate natural dentition Orofacial Anatomy: WFL Oral Motor/Sensory Function: WFL Anatomy: Anatomy: WFL Boluses Administered: Boluses Administered Boluses Administered: Thin liquids (Level 0); Mildly thick liquids (Level 2, nectar thick); Moderately thick liquids (Level 3, honey thick); Puree; Solid  Oral Impairment Domain: Oral Impairment Domain Lip Closure: No labial escape Tongue control during bolus hold: Posterior escape of less than half of bolus Bolus preparation/mastication: Timely and efficient chewing and mashing Bolus transport/lingual motion: Brisk tongue motion Oral residue: Trace residue lining oral structures Location of oral residue : Tongue Initiation of pharyngeal swallow : Posterior laryngeal surface of the epiglottis  Pharyngeal Impairment Domain: Pharyngeal Impairment Domain Soft palate elevation: No bolus between soft palate (SP)/pharyngeal wall (PW) Laryngeal elevation: Complete superior movement of thyroid cartilage with complete approximation of arytenoids to epiglottic petiole Anterior hyoid excursion: Complete anterior movement Epiglottic movement: Complete inversion Laryngeal vestibule closure: Incomplete, narrow column air/contrast in laryngeal vestibule Pharyngeal stripping wave : Present - complete Pharyngeal contraction (A/P view only): N/A Pharyngoesophageal segment opening: Complete distension and complete duration,  no obstruction of flow Tongue base retraction: Trace column of contrast or air between tongue base and PPW Pharyngeal residue: Complete pharyngeal clearance Location of pharyngeal residue: N/A  Esophageal Impairment Domain: Esophageal Impairment Domain Esophageal clearance  upright position: Complete clearance, esophageal coating Pill: No data recorded Penetration/Aspiration Scale Score: Penetration/Aspiration Scale Score 1.  Material does not enter airway: Moderately thick liquids (Level 3, honey thick); Puree; Solid 8.  Material enters airway, passes BELOW cords without attempt by patient to eject out (silent aspiration) : Mildly thick liquids (Level 2, nectar thick); Thin liquids (Level 0) Compensatory Strategies: Compensatory Strategies Compensatory strategies: Yes Straw: Ineffective Ineffective Straw: Thin liquid (Level 0); Mildly thick liquid (Level 2, nectar thick) Chin tuck: Effective Effective Chin Tuck: Thin liquid (Level 0); Mildly thick liquid (Level 2, nectar thick)   General Information: Caregiver present: No  Diet Prior to this Study: NPO   Temperature : Normal   Respiratory Status: WFL   Supplemental O2: None (Room air)   History of Recent Intubation: Yes  Behavior/Cognition: Alert; Cooperative; Pleasant mood Self-Feeding Abilities: Able to self-feed Baseline vocal quality/speech: Dysphonic Volitional Cough: Able to elicit Volitional Swallow: Able to elicit Exam Limitations: Poor positioning Goal Planning: Prognosis for improved oropharyngeal function: Good Barriers to Reach Goals: Cognitive deficits; Time post onset No data recorded Patient/Family Stated Goal: very thirsty Consulted and agree with results and recommendations: Patient Pain: Pain Assessment Pain Assessment: Faces Faces Pain Scale: 0 Facial Expression: 0 Body Movements: 0 Muscle Tension: 0 Compliance with ventilator (intubated pts.): N/A Vocalization (extubated pts.): 0 CPOT Total: 0 Pain Location: generalized Pain Descriptors /  Indicators: Discomfort; Grimacing Pain Intervention(s): Monitored during session End of Session: Start Time:SLP Start Time (ACUTE ONLY): 1452 Stop Time: SLP Stop Time (ACUTE ONLY): 1519 Time Calculation:SLP Time Calculation (min) (ACUTE ONLY): 27 min Charges: SLP Evaluations $ SLP Speech Visit: 1 Visit SLP Evaluations $BSS Swallow: 1 Procedure $MBS Swallow: 1 Procedure SLP visit diagnosis: SLP Visit Diagnosis: Dysphagia, oropharyngeal phase (R13.12) Past Medical History: Past Medical History: Diagnosis Date  Asthma   Cocaine use complicating pregnancy in third trimester 08/30/2017  Endometriosis   Ovarian cyst   Seizures (HCC)   Status post cesarean section 08/30/2017 Past Surgical History: Past Surgical History: Procedure Laterality Date  CESAREAN SECTION N/A 08/30/2017  Procedure: CESAREAN SECTION;  Surgeon: Conard Novak, MD;  Location: [facility] ORS;  Service: Obstetrics;  Laterality: N/A;  LASER ABLATION/CAUTERIZATION OF ENDOMETRIAL IMPLANTS   Gwynneth Aliment, M.A., CF-SLP Speech Language Pathology, Acute Rehabilitation Services Secure Chat preferred [phone] 03/01/2023, 4:10 PM  DG CHEST PORT 1 VIEW  Result Date: 02/27/2023 CLINICAL DATA:  Hypoxia. EXAM: PORTABLE CHEST 1 VIEW COMPARISON:  February 25, 2023. FINDINGS: Stable cardiomediastinal silhouette. Interval development of bilateral lung opacities most consistent with pulmonary edema with small bilateral pleural effusions. Feeding tube is seen entering stomach. Bony thorax is unremarkable. IMPRESSION: Interval development of bilateral diffuse lung opacities most consistent with pulmonary edema and small pleural effusions. Electronically Signed   By: Lupita Raider M.D.   On: 02/27/2023 11:50   Portable Chest xray  Result Date: 02/25/2023 CLINICAL DATA:  Respiratory failure, seizure. EXAM: PORTABLE CHEST 1 VIEW COMPARISON:  February 24, 2023. FINDINGS: The heart size and mediastinal contours are within normal limits. Endotracheal and feeding tubes are  unchanged. Mildly improved bibasilar opacities are noted. The visualized skeletal structures are unremarkable. IMPRESSION: Stable support apparatus. Mildly improved bibasilar opacities are noted compared to prior exam. Electronically Signed   By: Lupita Raider M.D.   On: 02/25/2023 09:39   DG Abd Portable 1V  Result Date: 02/24/2023 CLINICAL DATA:  Feeding tube placement EXAM: PORTABLE ABDOMEN - 1 VIEW COMPARISON:  CT abdomen/pelvis 01/24/2015 FINDINGS: The enteric catheter  tip is in the region of the pylorus. There is a nonobstructive bowel gas pattern. There is no evidence of free intraperitoneal air. There is no acute osseous abnormality. IMPRESSION: Enteric catheter tip in the region of the pylorus. Electronically Signed   By: Lesia Hausen M.D.   On: 02/24/2023 10:23   DG Chest Port 1 View  Result Date: 02/24/2023 CLINICAL DATA:  [id number].  Encounter for intubation. EXAM: PORTABLE CHEST 1 VIEW COMPARISON:  Portable chest yesterday at 5:08 a.m. FINDINGS: 5:20 a.m. ETT tip is 2.8 cm from the carina. NGT interval removal. The heart is slightly enlarged. There is increased vascular prominence. There are increased interstitial lines in the perihilar areas and bases consistent with interstitial edema. Findings consistent with CHF or fluid overload. There are increased perihilar hazy opacities on the left-greater-than-right which are probably due to ground-glass edema, but there also is asymmetric dense opacity in the retrocardiac left lower lobe which could be due to consolidation or atelectasis. Small pleural effusions have increased on the left-greater-than-right as well. Stable mediastinum and osseous structures. In all other respects no further changes. IMPRESSION: 1. Increased vascular prominence and interstitial edema consistent with CHF or fluid overload. 2. Increased perihilar hazy opacities on the left-greater-than-right which are probably due to ground-glass edema, but there is also asymmetric dense  opacity in the retrocardiac left lower lobe which could be due to consolidation or atelectasis. 3. Increased left-greater-than-right pleural effusions. Electronically Signed   By: Almira Bar M.D.   On: 02/24/2023 05:40   [place] EKG SITE RITE  Result Date: 02/23/2023 If Site Rite image not attached, placement could not be confirmed due to current cardiac rhythm.  Overnight EEG with video  Result Date: 02/23/2023 Charlsie Quest, MD     02/23/2023  8:42 AM Patient Name: Colleen Pearson MRN: [id number] Epilepsy Attending: Charlsie Quest Referring Physician/Provider: Rejeana Brock, MD Duration: 02/22/2023 9147 to 02/23/2023 8295 Patient history: 32 y.o. female with a history of seizure disorder, cocaine use who presents with status epilepticus. EEG to evaluate for seizure Level of alertness: comatose AEDs during EEG study: LEV, LTG, Propofol Technical aspects: This EEG study was done with scalp electrodes positioned according to the 10-20 International system of electrode placement. Electrical activity was reviewed with band pass filter of 1-70Hz , sensitivity of 7 uV/mm, display speed of 32mm/sec with a 60Hz  notched filter applied as appropriate. EEG data were recorded continuously and digitally stored.  Video monitoring was available and reviewed as appropriate. Description: EEG showed continuous generalized 3 to 6 Hz theta-delta slowing admixed with 13-15hz  beta activity distributed symmetrically and diffusely. Hyperventilation and photic stimulation were not performed.   ABNORMALITY - Continuous slow, generalized - Excessive beta, generalized IMPRESSION: This study is suggestive of severe diffuse encephalopathy likely related to sedation. No seizures or epileptiform discharges were seen throughout the recording. Charlsie Quest   DG Chest Port 1 View  Result Date: 02/23/2023 CLINICAL DATA:  Ventilator dependent respiratory failure.  Seizure. EXAM: PORTABLE CHEST 1 VIEW COMPARISON:  Portable chest  yesterday at 12:19 a.m. FINDINGS: 5:08 a.m. ETT tip is 2.5 cm from the carina. NGT passes well into the stomach extending towards the gastric antrum, but neither the side-hole or tip are filmed. There are increased hazy opacities in the lower lung fields and mid perihilar areas. This could be due to low lung volumes with atelectasis or could be due to pneumonia or less likely ground-glass edema. This was not seen yesterday. The lungs are otherwise  clear. The cardiac size is normal. The mediastinal configuration is normal.  No new osseous findings. IMPRESSION: New increased hazy opacities in the lower lung fields and mid perihilar areas. This could be due to low lung volumes with atelectasis or could be due to pneumonia or less likely ground-glass edema. Attention on follow-up recommended. In all other respects there are no further changes. Electronically Signed   By: Almira Bar M.D.   On: 02/23/2023 07:35   Rapid EEG  Result Date: 02/22/2023 Charlsie Quest, MD     02/23/2023  8:32 AM Patient Name: Colleen Pearson MRN: [id number] Epilepsy Attending: Charlsie Quest Referring Physician/Provider: Rejeana Brock, MD Duration: 02/22/2023 0142 to 0515 Patient history: 32 y.o. female with a history of seizure disorder, cocaine use who presents with status epilepticus. EEG to evaluate for seizure Level of alertness: comatose AEDs during EEG study: LEV, propofol Technical aspects: This EEG was obtained using a 10 lead EEG system positioned circumferentially without any parasagittal coverage (rapid EEG). Computer selected EEG is reviewed as  well as background features and all clinically significant events. Description: EEG showed continuous generalized 3 to 6 Hz theta-delta slowing admixed with 13-15hz  beta activity distributed symmetrically and diffusely. At around 0326 on 02/22/2023, EEG showed rhythmic polymorphic 3-6z theta-delta slowing in left temporal region  which appeared to evolve in morphology and  involve right temporal region lasting for about 35 seconds. No video available with rapid EEG. Hyperventilation and photic stimulation were not performed.   Parts of study were difficult to interpret due to significant electrode artifact. ABNORMALITY - Continuous slow, generalized IMPRESSION: This study is suggestive of severe diffuse encephalopathy likely related to sedation. At around 0326 on 02/22/2023, EEG showed rhythmic polymorphic 3-6z theta-delta slowing in left temporal region  which appeared to evolve in morphology and involve right temporal region lasting for about 35 seconds. This pattern was concerning for focal seizure. However, artifact cannot be completley excluded due to lack of video and limited EEG leads. Please obtain conventional video eeg for further evaluation. Priyanka [name]   CT ANGIO HEAD NECK W WO CM  Result Date: 02/22/2023 CLINICAL DATA:  32 year old female with altered mental status, neurologic deficit, prolonged seizure. EXAM: CT ANGIOGRAPHY HEAD AND NECK WITH CONTRAST TECHNIQUE: Multidetector CT imaging of the head and neck was performed using the standard protocol during bolus administration of intravenous contrast. Multiplanar CT image reconstructions and MIPs were obtained to evaluate the vascular anatomy. Carotid stenosis measurements (when applicable) are obtained utilizing NASCET criteria, using the distal internal carotid diameter as the denominator. RADIATION DOSE REDUCTION: This exam was performed according to the departmental dose-optimization program which includes automated exposure control, adjustment of the mA and/or kV according to patient size and/or use of iterative reconstruction technique. CONTRAST:  75mL OMNIPAQUE IOHEXOL 350 MG/ML SOLN COMPARISON:  Head CT 0118 hours today. Brain MRI 12/17/2022 and earlier. FINDINGS: CTA NECK Skeleton: No osseous abnormality identified. Upper chest: Apical pulmonary septal thickening. Intubated, endotracheal tube in good  position terminating above the carina. Confluent airspace opacity throughout the superior segment of the left lower lobe. Similar dependent airspace opacity in the left upper lobe, patchy similar changes in the superior segment of the right lower lobe. Superimposed bilateral airway thickening. Enteric tube courses in the esophagus. Visible central pulmonary arteries appear patent. Other neck: Intubated and oral enteric tube in place with no adverse features. Asymmetric enhancement of the right submandibular gland, but no obvious submandibular space inflammation. Significance is unclear. Aortic  arch: Normal 3 vessel arch. Right carotid system: Normal. Left carotid system: Mild obscuration from dense left subclavian and innominate vein contrast streak artifact. Otherwise normal. Vertebral arteries: Normal proximal right subclavian and right vertebral artery origin. Patent and normal right vertebral artery to the skull base. Normal proximal left subclavian and normal left vertebral artery origin. Fairly codominant left vertebral artery is patent and normal to the skull base. CTA HEAD Posterior circulation: Patent distal vertebral arteries and vertebrobasilar junction, normal. Right V4 mildly dominant. Normal left PICA origin. Right PICA not identified and might be diminutive or absent. Right AICA also diminutive. Patent basilar artery without stenosis. Patent basilar tip, SCA and PCA origins. Right posterior communicating artery is present, the left is diminutive or absent. Bilateral PCA branches are within normal limits. Anterior circulation: Both ICA siphons are patent, normal. Normal right posterior communicating artery origin. Normal carotid termini, MCA and ACA origins. Diminutive anterior communicating artery. Bilateral ACA branches are within normal limits. Left MCA M1 segment and bifurcation are patent without stenosis. Right MCA M1 segment and trifurcation are patent without stenosis. Bilateral MCA branches  are within normal limits. Venous sinuses: Early contrast timing, not well evaluated. Anatomic variants: None significant. Review of the MIP images confirms the above findings IMPRESSION: 1. Normal arterial findings on CTA head and neck. 2. Abnormal upper lungs with combined septal thickening, confluent airspace opacity left greater than right. Consider aspiration in this setting. Pneumonia and/or asymmetric edema less likely. 3. Satisfactory visible endotracheal and enteric tubes. 4. Note evidence of bilateral basal ganglia calcifications in this patient since at least age 34. 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 Electronically Signed   By: Odessa Fleming M.D.   On: 02/22/2023 05:50   CT HEAD WO CONTRAST ( )  Result Date: 02/22/2023 CLINICAL DATA:  Initial evaluation for mental status change, unknown cause. Seizure. EXAM: CT HEAD WITHOUT CONTRAST TECHNIQUE: Contiguous axial images were obtained from the base of the skull through the vertex without intravenous contrast. RADIATION DOSE REDUCTION: This exam was performed according to the departmental dose-optimization program which includes automated exposure control, adjustment of the mA and/or kV according to patient size and/or use of iterative reconstruction technique. COMPARISON:  Comparison made with prior CT and MRI from 12/17/2022. FINDINGS: Brain: Cerebral volume within normal limits. Symmetric calcification/mineralization involving the lentiform nuclei bilaterally noted, stable. No acute intracranial hemorrhage. No acute large vessel territory infarct. No mass lesion, midline shift or mass effect. No hydrocephalus or extra-axial fluid collection. Vascular: No abnormal hyperdense vessel. Skull: Scalp soft tissues demonstrate no acute finding. Calvarium intact. Sinuses/Orbits: Globes orbital soft tissues within normal limits. Mild scattered mucoperiosteal thickening present about the ethmoidal air cells and maxillary sinuses. Mastoid air cells are clear. Other: Endotracheal  and enteric tubes in place. IMPRESSION: 1. No acute intracranial abnormality. 2. Symmetric calcification/mineralization involving the lentiform nuclei bilaterally, stable. Electronically Signed   By: Rise Mu M.D.   On: 02/22/2023 01:31   DG Chest Port 1 View  Result Date: 02/22/2023 CLINICAL DATA:  Intubation EXAM: PORTABLE CHEST 1 VIEW COMPARISON:  Radiographs 12/17/2022 FINDINGS: Endotracheal tube tip in the mid intrathoracic trachea 2.6 cm from the carina. Subdiaphragmatic enteric tube. Stable cardiomediastinal silhouette. No focal consolidation, pleural effusion, or pneumothorax. No displaced rib fractures. IMPRESSION: Endotracheal tube tip 2.6 cm from the carina. Electronically Signed   By: Minerva Fester M.D.   On: 02/22/2023 00:33   (Echo, Carotid, EGD, Colonoscopy, ERCP)    Subjective: Patient seen in the morning rounds.  Without any complaints.  Patient has some left thigh pain but much better than before.   Discharge Exam: Vitals:   03/03/23 1145 03/03/23 1516  BP: (!) 106/51 122/71  Pulse: 90 97  Resp: 18 17  Temp: 97.6 F (36.4 C) 97.7 F (36.5 C)  SpO2: 98% 97%   Vitals:   03/03/23 0315 03/03/23 0749 03/03/23 1145 03/03/23 1516  BP: (!) 113/50 122/61 (!) 106/51 122/71  Pulse:  (!) 110 90 97  Resp: 20 18 18 17   Temp: 98.4 F (36.9 C) 98.2 F (36.8 C) 97.6 F (36.4 C) 97.7 F (36.5 C)  TempSrc: Oral Oral Tympanic Oral  SpO2:  97% 98% 97%  Weight:      Height:        General: Pt is alert, awake, not in acute distress Sitting in chair.  On room air. Cardiovascular: RRR, S1/S2 +, no rubs, no gallops Respiratory: CTA bilaterally, no wheezing, no rhonchi Abdominal: Soft, NT, ND, bowel sounds + Extremities:  Moderate swelling of the left anterior thigh.  No redness or erythema.    The results of significant diagnostics from this hospitalization (including imaging, microbiology, ancillary and laboratory) are listed below for reference.      Microbiology: Recent Results (from the past 240 hour(s))  Urine Culture     Status: None   Collection Time: 02/22/23 12:37 AM   Specimen: Urine, Catheterized  Result Value Ref [place] Status   Specimen Description URINE, CATHETERIZED  Final   Special Requests NONE  Final   Culture   Final    NO GROWTH Performed at [facility], [address] [place], [place] [address]    Report Status 02/23/2023 FINAL  Final  Culture, blood (Routine X 2) w Reflex to ID Panel     Status: None   Collection Time: 02/22/23  2:44 AM   Specimen: BLOOD  Result Value Ref [place] Status   Specimen Description BLOOD RIGHT WRIST  Final   Special Requests   Final    BOTTLES DRAWN AEROBIC AND ANAEROBIC Blood Culture adequate volume   Culture   Final    NO GROWTH 5 DAYS Performed at [facility], [address], [place], [place] [address]    Report Status 02/27/2023 FINAL  Final  Culture, blood (Routine X 2) w Reflex to ID Panel     Status: None   Collection Time: 02/22/23  2:46 AM   Specimen: BLOOD  Result Value Ref [place] Status   Specimen Description BLOOD RIGHT ANTECUBITAL  Final   Special Requests   Final    BOTTLES DRAWN AEROBIC AND ANAEROBIC Blood Culture adequate volume   Culture   Final    NO GROWTH 5 DAYS Performed at [facility], [address], [place], [place] [address]    Report Status 02/27/2023 FINAL  Final  MRSA Next Gen by PCR, Nasal     Status: Abnormal   Collection Time: 02/22/23  3:47 AM   Specimen: Nasal Mucosa; Nasal Swab  Result Value Ref [place] Status   MRSA by PCR Next Gen DETECTED (A) NOT DETECTED Final    Comment: RESULT CALLED TO, READ BACK BY AND VERIFIED WITH: J TIRRELL,RN@0549  02/22/23 MK (NOTE) The GeneXpert MRSA Assay (FDA approved for NASAL specimens only), is one component of a comprehensive MRSA colonization surveillance program. It is not intended to diagnose MRSA infection nor to guide or monitor treatment for MRSA infections. Test  performance is not FDA approved in patients less than 2 years  old. Performed at [facility], [address], [place], [place] [address]   Culture, Respiratory w Gram Stain     Status: None   Collection Time: 02/23/23  8:19 AM   Specimen: Tracheal Aspirate; Respiratory  Result Value Ref [place] Status   Specimen Description TRACHEAL ASPIRATE  Final   Special Requests NONE  Final   Gram Stain   Final    FEW WBC PRESENT, PREDOMINANTLY PMN NO ORGANISMS SEEN Performed at [facility], [address], [place], [place] [address]    Culture RARE METHICILLIN RESISTANT STAPHYLOCOCCUS AUREUS  Final   Report Status 02/25/2023 FINAL  Final   Organism ID, Bacteria METHICILLIN RESISTANT STAPHYLOCOCCUS AUREUS  Final      Susceptibility   Methicillin resistant staphylococcus aureus - MIC*    CIPROFLOXACIN <=0.5 SENSITIVE Sensitive     ERYTHROMYCIN <=0.25 SENSITIVE Sensitive     GENTAMICIN <=0.5 SENSITIVE Sensitive     OXACILLIN >=4 RESISTANT Resistant     TETRACYCLINE <=1 SENSITIVE Sensitive     VANCOMYCIN <=0.5 SENSITIVE Sensitive     TRIMETH/SULFA <=10 SENSITIVE Sensitive     CLINDAMYCIN <=0.25 SENSITIVE Sensitive     RIFAMPIN <=0.5 SENSITIVE Sensitive     Inducible Clindamycin NEGATIVE Sensitive     LINEZOLID 2 SENSITIVE Sensitive     * RARE METHICILLIN RESISTANT STAPHYLOCOCCUS AUREUS  SARS Coronavirus 2 by RT PCR (hospital order, performed in [facility] hospital lab) *cepheid single result test* Anterior Nasal Swab     Status: Abnormal   Collection Time: 02/24/23  7:21 AM   Specimen: Anterior Nasal Swab  Result Value Ref [place] Status   SARS Coronavirus 2 by RT PCR POSITIVE (A) NEGATIVE Final    Comment: Performed at [facility], [address], [place], [place] [address]  Culture, Respiratory w Gram Stain     Status: None   Collection Time: 02/24/23 10:49 AM   Specimen: Bronchoalveolar Lavage; Respiratory  Result Value Ref [place] Status   Specimen Description BRONCHIAL  ALVEOLAR LAVAGE  Final   Special Requests NONE  Final   Gram Stain   Final    ABUNDANT WBC PRESENT, PREDOMINANTLY PMN NO ORGANISMS SEEN Performed at [facility], [address] [place], [place] [address]    Culture RARE CANDIDA DUBLINIENSIS  Final   Report Status 02/28/2023 FINAL  Final     Labs: BNP (last 3 results) No results for input(s): "BNP" in the last 8760 hours. Basic Metabolic Panel: Recent Labs  Lab 02/24/23 1953 02/25/23 0409 02/27/23 1211 02/28/23 0825 03/01/23 0502 03/02/23 0807 03/03/23 1137  NA  --    < > 139 139 137 140 135  K  --    < > 3.4* 3.9 3.9 4.0 4.0  CL  --    < > 107 106 106 102 105  CO2  --    < > 25 21* 23 21* 19*  GLUCOSE  --    < > 113* 137* 62* 144* 119*  BUN  --    < > 28* 21* 18 14 19   CREATININE  --    < > 1.01* 0.87 0.92 0.85 0.85  CALCIUM  --    < > 7.9* 8.0* 8.3* 9.2 8.4*  MG 2.2  --   --   --   --   --   --   PHOS 2.7  --   --   --   --   --   --    < > =  values in this interval not displayed.   Liver Function Tests: No results for input(s): "AST", "ALT", "ALKPHOS", "BILITOT", "PROT", "ALBUMIN" in the last 168 hours. No results for input(s): "LIPASE", "AMYLASE" in the last 168 hours. No results for input(s): "AMMONIA" in the last 168 hours. CBC: Recent Labs  Lab 02/25/23 0409 02/25/23 0912 02/25/23 1505 02/26/23 0800 02/27/23 1211 02/28/23 0825 03/01/23 0502  WBC 9.3  --   --  12.8* 10.6* 10.7* 13.2*  HGB 8.6*   < > 8.2* 8.0* 9.5* 9.6* 10.8*  HCT 24.7*   < > 24.0* 24.5* 28.8* 29.0* 32.4*  MCV 84.3  --   --  88.1 90.0 87.1 87.3  PLT 169  --   --  187 252 290 407*   < > = values in this interval not displayed.   Cardiac Enzymes: Recent Labs  Lab 02/27/23 1211 02/28/23 0825 03/01/23 0502 03/02/23 0807 03/03/23 1137  CKTOTAL 31,091* 15,098* 12,220* 5,552* 2,690*   BNP: Invalid input(s): "POCBNP" CBG: Recent Labs  Lab 03/01/23 2044 03/01/23 2309 03/02/23 0411 03/02/23 0751 03/02/23 1137  GLUCAP 107*  147* 115* 146* 142*   D-Dimer No results for input(s): "DDIMER" in the last 72 hours. Hgb A1c No results for input(s): "HGBA1C" in the last 72 hours. Lipid Profile No results for input(s): "CHOL", "HDL", "LDLCALC", "TRIG", "CHOLHDL", "LDLDIRECT" in the last 72 hours. Thyroid function studies No results for input(s): "TSH", "T4TOTAL", "T3FREE", "THYROIDAB" in the last 72 hours.  Invalid input(s): "FREET3" Anemia work up No results for input(s): "VITAMINB12", "FOLATE", "FERRITIN", "TIBC", "IRON", "RETICCTPCT" in the last 72 hours. Urinalysis    Component Value Date/Time   COLORURINE YELLOW 02/25/2023 0114   APPEARANCEUR CLEAR 02/25/2023 0114   APPEARANCEUR Cloudy 10/04/2014 1606   LABSPEC 1.026 02/25/2023 0114   LABSPEC 1.014 10/04/2014 1606   PHURINE 7.0 02/25/2023 0114   GLUCOSEU >=500 (A) 02/25/2023 0114   GLUCOSEU Negative 10/04/2014 1606   HGBUR MODERATE (A) 02/25/2023 0114   BILIRUBINUR NEGATIVE 02/25/2023 0114   BILIRUBINUR Negative 10/04/2014 1606   KETONESUR 5 (A) 02/25/2023 0114   PROTEINUR 100 (A) 02/25/2023 0114   UROBILINOGEN 1.0 09/29/2014 2122   NITRITE NEGATIVE 02/25/2023 0114   LEUKOCYTESUR NEGATIVE 02/25/2023 0114   LEUKOCYTESUR Trace 10/04/2014 1606   Sepsis Labs Recent Labs  Lab 02/26/23 0800 02/27/23 1211 02/28/23 0825 03/01/23 0502  WBC 12.8* 10.6* 10.7* 13.2*   Microbiology Recent Results (from the past 240 hour(s))  Urine Culture     Status: None   Collection Time: 02/22/23 12:37 AM   Specimen: Urine, Catheterized  Result Value Ref [place] Status   Specimen Description URINE, CATHETERIZED  Final   Special Requests NONE  Final   Culture   Final    NO GROWTH Performed at [facility], [address] [place], [place] [address]    Report Status 02/23/2023 FINAL  Final  Culture, blood (Routine X 2) w Reflex to ID Panel     Status: None   Collection Time: 02/22/23  2:44 AM   Specimen: BLOOD  Result Value Ref [place] Status   Specimen  Description BLOOD RIGHT WRIST  Final   Special Requests   Final    BOTTLES DRAWN AEROBIC AND ANAEROBIC Blood Culture adequate volume   Culture   Final    NO GROWTH 5 DAYS Performed at [facility], [address] [place], [place] [address]    Report Status 02/27/2023 FINAL  Final  Culture, blood (Routine X 2) w  Reflex to ID Panel     Status: None   Collection Time: 02/22/23  2:46 AM   Specimen: BLOOD  Result Value Ref [place] Status   Specimen Description BLOOD RIGHT ANTECUBITAL  Final   Special Requests   Final    BOTTLES DRAWN AEROBIC AND ANAEROBIC Blood Culture adequate volume   Culture   Final    NO GROWTH 5 DAYS Performed at [facility], [address], [place], [place] [address]    Report Status 02/27/2023 FINAL  Final  MRSA Next Gen by PCR, Nasal     Status: Abnormal   Collection Time: 02/22/23  3:47 AM   Specimen: Nasal Mucosa; Nasal Swab  Result Value Ref [place] Status   MRSA by PCR Next Gen DETECTED (A) NOT DETECTED Final    Comment: RESULT CALLED TO, READ BACK BY AND VERIFIED WITH: J TIRRELL,RN@0549  02/22/23 MK (NOTE) The GeneXpert MRSA Assay (FDA approved for NASAL specimens only), is one component of a comprehensive MRSA colonization surveillance program. It is not intended to diagnose MRSA infection nor to guide or monitor treatment for MRSA infections. Test performance is not FDA approved in patients less than 4 years old. Performed at [facility], [address] [place], [place] [address]   Culture, Respiratory w Gram Stain     Status: None   Collection Time: 02/23/23  8:19 AM   Specimen: Tracheal Aspirate; Respiratory  Result Value Ref [place] Status   Specimen Description TRACHEAL ASPIRATE  Final   Special Requests NONE  Final   Gram Stain   Final    FEW WBC PRESENT, PREDOMINANTLY PMN NO ORGANISMS SEEN Performed at [facility], [address], [place], [place] [address]    Culture RARE METHICILLIN RESISTANT STAPHYLOCOCCUS  AUREUS  Final   Report Status 02/25/2023 FINAL  Final   Organism ID, Bacteria METHICILLIN RESISTANT STAPHYLOCOCCUS AUREUS  Final      Susceptibility   Methicillin resistant staphylococcus aureus - MIC*    CIPROFLOXACIN <=0.5 SENSITIVE Sensitive     ERYTHROMYCIN <=0.25 SENSITIVE Sensitive     GENTAMICIN <=0.5 SENSITIVE Sensitive     OXACILLIN >=4 RESISTANT Resistant     TETRACYCLINE <=1 SENSITIVE Sensitive     VANCOMYCIN <=0.5 SENSITIVE Sensitive     TRIMETH/SULFA <=10 SENSITIVE Sensitive     CLINDAMYCIN <=0.25 SENSITIVE Sensitive     RIFAMPIN <=0.5 SENSITIVE Sensitive     Inducible Clindamycin NEGATIVE Sensitive     LINEZOLID 2 SENSITIVE Sensitive     * RARE METHICILLIN RESISTANT STAPHYLOCOCCUS AUREUS  SARS Coronavirus 2 by RT PCR (hospital order, performed in [facility] hospital lab) *cepheid single result test* Anterior Nasal Swab     Status: Abnormal   Collection Time: 02/24/23  7:21 AM   Specimen: Anterior Nasal Swab  Result Value Ref [place] Status   SARS Coronavirus 2 by RT PCR POSITIVE (A) NEGATIVE Final    Comment: Performed at [facility], [address] [place], [place] [address]  Culture, Respiratory w Gram Stain     Status: None   Collection Time: 02/24/23 10:49 AM   Specimen: Bronchoalveolar Lavage; Respiratory  Result Value Ref [place] Status   Specimen Description BRONCHIAL ALVEOLAR LAVAGE  Final   Special Requests NONE  Final   Gram Stain   Final    ABUNDANT WBC PRESENT, PREDOMINANTLY PMN NO ORGANISMS SEEN Performed at [facility], [address], [place], [place] [address]    Culture RARE CANDIDA  DUBLINIENSIS  Final   Report Status 02/28/2023 FINAL  Final     Time coordinating discharge: 32 minutes  SIGNED:   Dorcas Carrow, MD  [facility] 03/03/2023, 3:22 PM

## 2023-03-04 DIAGNOSIS — R5381 Other malaise: Secondary | ICD-10-CM | POA: Diagnosis not present

## 2023-03-04 LAB — CBC WITH DIFFERENTIAL/PLATELET
Abs Immature Granulocytes: 0.47 10*3/uL — ABNORMAL HIGH (ref 0.00–0.07)
Basophils Absolute: 0.1 10*3/uL (ref 0.0–0.1)
Basophils Relative: 1 %
Eosinophils Absolute: 0.7 10*3/uL — ABNORMAL HIGH (ref 0.0–0.5)
Eosinophils Relative: 5 %
HCT: 36 % (ref 36.0–46.0)
Hemoglobin: 11.9 g/dL — ABNORMAL LOW (ref 12.0–15.0)
Immature Granulocytes: 3 %
Lymphocytes Relative: 11 %
Lymphs Abs: 1.7 10*3/uL (ref 0.7–4.0)
MCH: 28.6 pg (ref 26.0–34.0)
MCHC: 33.1 g/dL (ref 30.0–36.0)
MCV: 86.5 fL (ref 80.0–100.0)
Monocytes Absolute: 1 10*3/uL (ref 0.1–1.0)
Monocytes Relative: 6 %
Neutro Abs: 11.4 10*3/uL — ABNORMAL HIGH (ref 1.7–7.7)
Neutrophils Relative %: 74 %
Platelets: 711 10*3/uL — ABNORMAL HIGH (ref 150–400)
RBC: 4.16 MIL/uL (ref 3.87–5.11)
RDW: 13.1 % (ref 11.5–15.5)
WBC: 15.3 10*3/uL — ABNORMAL HIGH (ref 4.0–10.5)
nRBC: 0 % (ref 0.0–0.2)

## 2023-03-04 LAB — URINALYSIS, ROUTINE W REFLEX MICROSCOPIC
Bilirubin Urine: NEGATIVE
Glucose, UA: NEGATIVE mg/dL
Ketones, ur: 5 mg/dL — AB
Leukocytes,Ua: NEGATIVE
Nitrite: NEGATIVE
Protein, ur: NEGATIVE mg/dL
Specific Gravity, Urine: 1.016 (ref 1.005–1.030)
pH: 6 (ref 5.0–8.0)

## 2023-03-04 LAB — COMPREHENSIVE METABOLIC PANEL
ALT: 317 U/L — ABNORMAL HIGH (ref 0–44)
AST: 154 U/L — ABNORMAL HIGH (ref 15–41)
Albumin: 3 g/dL — ABNORMAL LOW (ref 3.5–5.0)
Alkaline Phosphatase: 69 U/L (ref 38–126)
Anion gap: 16 — ABNORMAL HIGH (ref 5–15)
BUN: 16 mg/dL (ref 6–20)
CO2: 20 mmol/L — ABNORMAL LOW (ref 22–32)
Calcium: 8.7 mg/dL — ABNORMAL LOW (ref 8.9–10.3)
Chloride: 101 mmol/L (ref 98–111)
Creatinine, Ser: 0.88 mg/dL (ref 0.44–1.00)
GFR, Estimated: >60 mL/min (ref >60)
Glucose, Bld: 92 mg/dL (ref 70–99)
Potassium: 4 mmol/L (ref 3.5–5.1)
Sodium: 137 mmol/L (ref 135–145)
Total Bilirubin: 0.7 mg/dL (ref 0.3–1.2)
Total Protein: 6.3 g/dL — ABNORMAL LOW (ref 6.5–8.1)

## 2023-03-04 LAB — CK: Total CK: 1994 U/L — ABNORMAL HIGH (ref 38–234)

## 2023-03-04 MED ORDER — ACETAMINOPHEN 325 MG PO TABS
325.0000 mg | ORAL_TABLET | Freq: Four times a day (QID) | ORAL | Status: DC | PRN
  Administered 2023-03-04 – 2023-03-06 (×4): 325 mg via ORAL
  Filled 2023-03-04 (×4): qty 1

## 2023-03-04 NOTE — Progress Notes (Signed)
Inpatient Rehabilitation Care Coordinator Assessment and Plan Patient Details  Name: Colleen Pearson MRN: [id number] Date of Birth: 03/05/1991  Today's Date: 03/04/2023  Hospital Problems: Principal Problem:   Debility Active Problems:   Seizures (HCC)   Delirium   Dysphagia   Polysubstance abuse (HCC)   Left thigh pain   Tachycardia  Past Medical History:  Past Medical History:  Diagnosis Date   Asthma    Cocaine use complicating pregnancy in third trimester 08/30/2017   Endometriosis    Ovarian cyst    Seizures (HCC)    Status post cesarean section 08/30/2017   Past Surgical History:  Past Surgical History:  Procedure Laterality Date   CESAREAN SECTION N/A 08/30/2017   Procedure: CESAREAN SECTION;  Surgeon: Conard Novak, MD;  Location: [facility] ORS;  Service: Obstetrics;  Laterality: N/A;   LASER ABLATION/CAUTERIZATION OF ENDOMETRIAL IMPLANTS     Social History:  reports that she has been smoking cigarettes and e-cigarettes. She has never used smokeless tobacco. She reports that she does not currently use drugs after having used the following drugs: Marijuana and Cocaine. She reports that she does not drink alcohol.  Family / Support Systems Marital Status: Single Spouse/Significant Other: N/A Children: Kinsely- 5 y.o.dtr Other Supports: N/A Anticipated Caregiver: mother Ability/Limitations of Caregiver: Pt can discharge to home with her or go to her father's home. Pt is undecided. Caregiver Availability: 24/7 Family Dynamics: Pt currently lives with her mother, stepfather, dtr, and younger sister.  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 Education: 9th  grade Health Literacy - How often do you need to have someone help you when you read instructions, pamphlets, or other written material from your doctor or pharmacy?: Never Writes: Yes Employment  Status: Employed Name of Employer: [employer] Length of Employment:  (8 years) Return to Work Plans: TBD Marine scientist Issues: Reports an incident when 32 yrs old; nothing current Guardian/Conservator: Denies   Abuse/Neglect Abuse/Neglect Assessment Can Be Completed: Yes Physical Abuse: Denies Verbal Abuse: Denies Sexual Abuse: Denies Exploitation of patient/patient's resources: Denies Self-Neglect: Denies  Patient response to: Social Isolation - How often do you feel lonely or isolated from those around you?: Never  Emotional Status Pt's affect, behavior and adjustment status: Pt was resting at   time of visit, and willing to speak with SW. She  is undecided about her discharge plan. Otherwise, no concerns reported. Recent Psychosocial Issues: Denies Psychiatric History: Pt reports a hx of depression in which she does not currently take any medication, and she just "manages." She has had counseling in the past , however, unsure if this is something she would like to pursue again. Substance Abuse History: Pt repots she vapes daily. Denies etoh and rec drug use.  Patient / Family Perceptions, Expectations & Goals Pt/Family understanding of illness & functional limitations: Pt and family have a general understanding of pt care needs Premorbid pt/family roles/activities: Independent Anticipated changes in roles/activities/participation: Assistance with ADLs/IADLs Pt/family expectations/goals: pt goal is work towards getting left leg back right because unable to feel it.  Community Resources [employer]: None Premorbid Home Care/DME Agencies: None Transportation available at discharge: TBD Is the patient able to respond to transportation needs?: Yes In the past 12 months, has lack of transportation kept you from medical appointments or from getting medications?: No In the past 12 months, has lack of transportation kept you from meetings, work, or  from getting things  needed for daily living?: No Resource referrals recommended: Neuropsychology  Discharge Planning Living Arrangements: Children Support Systems: Parent, Other relatives Type of Residence: Private residence Insurance Resources: Media planner (specify) ([employer] Medicare) Financial Resources: Employment Financial Screen Referred: No Living Expenses: Lives with family Money Management: Family Does the patient have any problems obtaining your medications?: No Home Management: Pt reports her mother manages all her home care needs Patient/Family Preliminary Plans: TBD Care Coordinator Barriers to Discharge: Decreased caregiver support, Lack of/limited family support, Insurance for SNF coverage Care Coordinator Anticipated Follow Up Needs: HH/OP  Clinical Impression SW met with pt in room to introduce self, explain role, and discuss discharge process. Pt is not a Cytogeneticist. No HCPOA. No DME.   Zaynah Chawla A Tomeko Scoville 03/04/2023, 3:29 PM

## 2023-03-04 NOTE — Progress Notes (Signed)
PROGRESS NOTE   Subjective/Complaints:   Pt sleeping- denies issues with breathing- breathing well and does have hoarse voice  denies seizures LBM overnight  ROS:  Pt denies SOB, abd pain, CP, N/V/C/D, and vision changes  Except for HPI  Objective:   VAS [place] LOWER EXTREMITY VENOUS (DVT)  Result Date: 03/02/2023  Lower Venous DVT Study Patient Name:  Colleen Pearson [facility]  Date of Exam:   03/02/2023 Medical Rec #: [id number]           Accession #:    [id number] Date of Birth: [birth year]           Patient Gender: F Patient Age:   57 years Exam Location:  [facility] Procedure:      VAS [place] LOWER EXTREMITY VENOUS (DVT) Referring Phys: Shonna Chock --------------------------------------------------------------------------------  Indications: Left thigh swelling in setting of rhabdo. Other Indications: Covid-19. Comparison Study: No prior studies. Performing Technologist: Jean Rosenthal RDMS, RVT  Examination Guidelines: A complete evaluation includes B-mode imaging, spectral Doppler, color Doppler, and power Doppler as needed of all accessible portions of each vessel. Bilateral testing is considered an integral part of a complete examination. Limited examinations for reoccurring indications may be performed as noted. The reflux portion of the exam is performed with the patient in reverse Trendelenburg.  +-----+---------------+---------+-----------+----------+--------------+ RIGHTCompressibilityPhasicitySpontaneityPropertiesThrombus Aging +-----+---------------+---------+-----------+----------+--------------+ CFV  Full           Yes      Yes                                 +-----+---------------+---------+-----------+----------+--------------+   +---------+---------------+---------+-----------+----------+--------------+ LEFT     CompressibilityPhasicitySpontaneityPropertiesThrombus Aging  +---------+---------------+---------+-----------+----------+--------------+ CFV      Full           Yes      Yes                                 +---------+---------------+---------+-----------+----------+--------------+ SFJ      Full                                                        +---------+---------------+---------+-----------+----------+--------------+ FV Prox  Full                                                        +---------+---------------+---------+-----------+----------+--------------+ FV Mid   Full                                                        +---------+---------------+---------+-----------+----------+--------------+ FV DistalFull  Yes      Yes                                 +---------+---------------+---------+-----------+----------+--------------+ PFV      Full                                                        +---------+---------------+---------+-----------+----------+--------------+ POP      Full           Yes      Yes                                 +---------+---------------+---------+-----------+----------+--------------+ PTV      Full                                                        +---------+---------------+---------+-----------+----------+--------------+ PERO     Full                                                        +---------+---------------+---------+-----------+----------+--------------+     Summary: RIGHT: - No evidence of common femoral vein obstruction.  LEFT: - There is no evidence of deep vein thrombosis in the lower extremity.  - No cystic structure found in the popliteal fossa.  *See table(s) above for measurements and observations. Electronically signed by Lemar Livings MD on 03/02/2023 at 5:40:15 PM.    Final    ECHOCARDIOGRAM COMPLETE  Result Date: 03/02/2023    ECHOCARDIOGRAM REPORT   Patient Name:   Colleen Pearson [facility] Date of Exam: 03/02/2023 Medical Rec #:  [id number]           Height:       61.0 in Accession #:    [id number]         Weight:       157.6 lb Date of Birth:  05-31-1991          BSA:          1.707 m Patient Age:    32 years           BP:           130/82 mmHg Patient Gender: F                  HR:           98 bpm. Exam Location:  Inpatient Procedure: 2D Echo, Color Doppler and Cardiac Doppler Indications:    Elevated Troponin  History:        Patient has no prior history of Echocardiogram examinations.                 Seizure; Risk Factors:Current Smoker. Substance Abuse.  Sonographer:    Aron Baba Referring Phys: [id number] NOAH BEDFORD ZHYQ  Sonographer Comments: Image acquisition challenging due to respiratory motion. IMPRESSIONS  1. Left ventricular ejection fraction, by estimation, is  50 to 55%. The left ventricle has low normal function. The left ventricle has no regional wall motion abnormalities. Left ventricular diastolic parameters were normal.  2. Right ventricular systolic function is normal. The right ventricular size is normal. There is normal pulmonary artery systolic pressure. The estimated right ventricular systolic pressure is 11.5 mmHg.  3. The mitral valve is grossly normal. Trivial mitral valve regurgitation. No evidence of mitral stenosis.  4. The aortic valve is grossly normal. Aortic valve regurgitation is not visualized. No aortic stenosis is present.  5. The inferior vena cava is normal in size with greater than 50% respiratory variability, suggesting right atrial pressure of 3 mmHg. FINDINGS  Left Ventricle: Left ventricular ejection fraction, by estimation, is 50 to 55%. The left ventricle has low normal function. The left ventricle has no regional wall motion abnormalities. The left ventricular internal cavity size was normal in size. There is no left ventricular hypertrophy. Left ventricular diastolic parameters were normal. Right Ventricle: The right ventricular size is normal. No increase in right ventricular wall thickness. Right  ventricular systolic function is normal. There is normal pulmonary artery systolic pressure. The tricuspid regurgitant velocity is 1.46 m/s, and  with an assumed right atrial pressure of 3 mmHg, the estimated right ventricular systolic pressure is 11.5 mmHg. Left Atrium: Left atrial size was normal in size. Right Atrium: Right atrial size was normal in size. Pericardium: There is no evidence of pericardial effusion. Mitral Valve: The mitral valve is grossly normal. Trivial mitral valve regurgitation. No evidence of mitral valve stenosis. Tricuspid Valve: The tricuspid valve is grossly normal. Tricuspid valve regurgitation is trivial. No evidence of tricuspid stenosis. Aortic Valve: The aortic valve is grossly normal. Aortic valve regurgitation is not visualized. No aortic stenosis is present. Pulmonic Valve: The pulmonic valve was grossly normal. Pulmonic valve regurgitation is not visualized. No evidence of pulmonic stenosis. Aorta: The aortic root and ascending aorta are structurally normal, with no evidence of dilitation. Venous: The inferior vena cava is normal in size with greater than 50% respiratory variability, suggesting right atrial pressure of 3 mmHg. IAS/Shunts: The atrial septum is grossly normal.  LEFT VENTRICLE PLAX 2D LVIDd:         4.00 cm   Diastology LVIDs:         3.00 cm   LV e' medial:    8.05 cm/s LV PW:         0.90 cm   LV E/e' medial:  13.3 LV IVS:        0.70 cm   LV e' lateral:   11.00 cm/s LVOT diam:     1.40 cm   LV E/e' lateral: 9.7 LV SV:         29 LV SV Index:   17 LVOT Area:     1.54 cm  RIGHT VENTRICLE RV S prime:     16.30 cm/s TAPSE (M-mode): 1.5 cm LEFT ATRIUM             Index        RIGHT ATRIUM           Index LA diam:        2.60 cm 1.52 cm/m   RA Area:     10.10 cm LA Vol (A2C):   25.5 ml 14.94 ml/m  RA Volume:   19.30 ml  11.31 ml/m LA Vol (A4C):   22.9 ml 13.42 ml/m LA Biplane Vol: 24.9 ml 14.59 ml/m  AORTIC VALVE LVOT Vmax:  130.00 cm/s LVOT Vmean:  83.200 cm/s  LVOT VTI:    0.186 m  AORTA Ao Root diam: 2.60 cm Ao Asc diam:  2.50 cm MITRAL VALVE                TRICUSPID VALVE MV Area (PHT): 5.42 cm     TR Peak grad:   8.5 mmHg MV Decel Time: 140 msec     TR Vmax:        146.00 cm/s MR Peak grad: 7.1 mmHg MR Vmax:      133.00 cm/s   SHUNTS MV E velocity: 107.00 cm/s  Systemic VTI:  0.19 m MV A velocity: 75.30 cm/s   Systemic Diam: 1.40 cm MV E/A ratio:  1.42 Lennie Odor MD Electronically signed by Lennie Odor MD Signature Date/Time: 03/02/2023/11:15:54 AM    Final    Recent Labs    03/04/23 0758  WBC 15.3*  HGB 11.9*  HCT 36.0  PLT 711*   Recent Labs    03/02/23 0807 03/03/23 1137  NA 140 135  K 4.0 4.0  CL 102 105  CO2 21* 19*  GLUCOSE 144* 119*  BUN 14 19  CREATININE 0.85 0.85  CALCIUM 9.2 8.4*   No intake or output data in the 24 hours ending 03/04/23 0902      Physical Exam: Vital Signs Blood pressure 127/76, pulse 91, temperature 97.7 F (36.5 C), temperature source Oral, resp. rate 15, height 5\' 1"  (1.549 m), weight 71.5 kg, SpO2 100%.   General: awake, alert, appropriate, supine in bed; just woke up;  NAD HENT: conjugate gaze; oropharynx moist CV: regular rate and rhythm; no JVD Pulmonary: CTA B/L; no W/R/R- good air movement- sounds good GI: soft, NT, ND, (+)BS Psychiatric: appropriate- flat affect, but just woke up Neurological: alert Skin: Clean and intact without signs of breakdown, small bruises noted in bilateral upper extremities Neuro:  Alert and oriented x4, follows commands, CN 2-12 intact, no dysarthria, soft voice, fair memory RUE: 5/5 Deltoid, 5/5 Biceps, 5/5 Triceps, 5/5 Wrist Ext, 5/5 Grip LUE: 5/5 Deltoid, 5/5 Biceps, 5/5 Triceps, 5/5 Wrist Ext, 5/5 Grip RLE: HF 5/5, KE 5/5,  ADF 5/5, APF 5/5 LLE: HF 2/5, KE 4-/5, ADF 5/5, APF 5/5 Sensory exam normal for light touch and pain in all 4 limbs. No limb ataxia or cerebellar signs. No abnormal tone appreciated.    Musculoskeletal: mild C spine paraspinal  tenderness, L thigh with tenderness and pain with PROM, mild R wrist tenderness RUE IV- looks ok  Assessment/Plan: 1. Functional deficits which require 3+ hours per day of interdisciplinary therapy in a comprehensive inpatient rehab setting. Physiatrist is providing close team supervision and 24 hour management of active medical problems listed below. Physiatrist and rehab team continue to assess barriers to discharge/monitor patient progress toward functional and medical goals  Care Tool:  Bathing              Bathing assist       Upper Body Dressing/Undressing Upper body dressing        Upper body assist      Lower Body Dressing/Undressing Lower body dressing            Lower body assist       Toileting Toileting    Toileting assist       Transfers Chair/bed transfer  Transfers assist           Locomotion Ambulation   Ambulation assist  Walk 10 feet activity   Assist           Walk 50 feet activity   Assist           Walk 150 feet activity   Assist           Walk 10 feet on uneven surface  activity   Assist           Wheelchair     Assist               Wheelchair 50 feet with 2 turns activity    Assist            Wheelchair 150 feet activity     Assist          Blood pressure 127/76, pulse 91, temperature 97.7 F (36.5 C), temperature source Oral, resp. rate 15, height 5\' 1"  (1.549 m), weight 71.5 kg, SpO2 100%.  Medical Problem List and Plan: 1. Functional deficits secondary to debility due to status epilepticus with delirium and left thigh rhabdomyolysis and pain              -patient may shower             -ELOS/Goals: 10-12 days, Mod I to supervision PT/OT/SLP             -Admit to CIR   First day of evaluations- Con't CIR PT, OT and SLP 2.  Antithrombotics: -DVT/anticoagulation:  Pharmaceutical: Lovenox             -antiplatelet therapy: N/A 3. Pain  Management: Tylenol prn.  4. Mood/Behavior/Sleep: LCSW to follow for evaluation and support.             -antipsychotic agents: Seroquel-->ativan and haldol d/c 5. Neuropsych/cognition: This patient is not fully capable of making decisions on his own behalf. 6. Skin/Wound Care: Routine pressure relief measures.  7. Fluids/Electrolytes/Nutrition: Monitor I/O. Check CMET in am. 8. Seizure d/o: On Keppra and Lamictal.             --status epilepticus due to THC/Covid and resolved. Followed by Dr. Malvin Johns [facility])  9/12- cont' seizure precautions.  9. Aspiration PNA/Covid PNA:   MRSA+ BAL-> Vanc-->Linezolid thru 09/12 for 7 day course antibiotic              --aspiration precautions due to dysphagia. Covid precautions thru 09/14 10. Tachycardia: Likely due to deconditioning with HR 120-140. Monitor for symptoms.  11.  Left thigh pain/swelling: Traumatic due to rhabdo. CK improving from 47,715-->2,6909 --AKI has resolved. Tylenol prn for pain.  9/12- will recheck CK in AM 12. Delirium: Will continue Seroquel 50 mg am/100 mg HS  9/12- working well 13.  Dysphagia: DYS 2 with honey thick diet.  Continue SLP 14.  Marijuana use.  History of cocaine use.  Advised cessation 15.  Anemia/thrombocytosis.  Hgb up to 10.8 on 9/9.  Platelets 407K.  Recheck CMP  9/12- Plts up to 711- if gets to 1 million, will add ASA 16. Leukocytosis-   9/12- up to 15.3 from 13.1 from 10 4 days ago- will check U/A and Cx- also finished Linezolid for aspiration PNA-    - will recheck labs in AM 17. Transaminitis  9/12- LFTS's 154 and 317- up from 80/36 on 9/2- will reduce tylenol to 325 mg and recheck Monday and Thursday     I spent a total of  58  minutes on total care today- >50% coordination of care- due to  D/w team  about tylenol/transaminitis, CK levels; labs; review of labs, vitals and bowels- also d/w nursing to put in computer COVID precautions- complex medical issues Also dealing with leukocytosis- labs ordered to  check U/A and Cx   LOS: 1 days A FACE TO FACE EVALUATION WAS PERFORMED  Colleen Pearson 03/04/2023, 9:02 AM

## 2023-03-04 NOTE — Progress Notes (Signed)
Inpatient Rehabilitation  Patient information reviewed and entered into eRehab system by Melissa M. Bowie, M.A., CCC/SLP, PPS Coordinator.  Information including medical coding, functional ability and quality indicators will be reviewed and updated through discharge.    

## 2023-03-04 NOTE — Evaluation (Signed)
Occupational Therapy Assessment and Plan  Patient Details  Name: Colleen Pearson MRN: [id number] Date of Birth: 15-Nov-1990  OT Diagnosis: muscle weakness (generalized) Rehab Potential: Rehab Potential (ACUTE ONLY): Good ELOS: 7-10 days   Today's Date: 03/04/2023 OT Individual Time: 1302-1400 OT Individual Time Calculation (min): 58 min     Hospital Problem: Principal Problem:   Debility Active Problems:   Seizures (HCC)   Delirium   Dysphagia   Polysubstance abuse (HCC)   Left thigh pain   Tachycardia   Past Medical History:  Past Medical History:  Diagnosis Date   Asthma    Cocaine use complicating pregnancy in third trimester 08/30/2017   Endometriosis    Ovarian cyst    Seizures (HCC)    Status post cesarean section 08/30/2017   Past Surgical History:  Past Surgical History:  Procedure Laterality Date   CESAREAN SECTION N/A 08/30/2017   Procedure: CESAREAN SECTION;  Surgeon: Conard Novak, MD;  Location: [facility] ORS;  Service: Obstetrics;  Laterality: N/A;   LASER ABLATION/CAUTERIZATION OF ENDOMETRIAL IMPLANTS      Assessment & Plan Clinical Impression:  Colleen Pearson is a 32 year old female with history of asthma, polysubstance abuse, seizures on Keppra with last breakthrough seizure 06/24 who was admitted on 02/22/23 with status epilepticus and seizure lasting >45 minutes. She required multiple doses of midazolam, intubated, unresponsive and loaded with IV Keppra and intermittent hypotension treated with 4 L fluids. UDS positive for benzos and THC. CT head negative. Bedside EEG which was negative for seizures and was found to have metabolic acidosis with bicarb <7, SCr 1.74, LA> 15, CK- 47,714 and WBC-18.  She was started on Ceftriaxone for possible aspiration. She developed fever T 102 with posturing on 09/02 and CTA head/chest was negative for stenosis, occlusions but showed abnormal upper lung thickening with confluent airspace opacity L>R  with question of aspiration.   Rapid EEG showed concern of focal seizure in right temporal region. Lamictal resumed and loaded with phenobarb which was weaned off by 09/07.   AKI with severe with rhabdomyolysis treated with bicarb drip.    LT-EEG showed severe diffuse encephalopathy, sedation weaned off and she was briefly extubated on 09/03. She had sudden decline with hypercapnic respiratory failure early am of 09/04 requiring reintubation and was found to be Covid positive. She underwent bronchoscopy revealing tenacious secretions throughout airway and underwent therapeutic aspiration in RLL/LLL. Antibiotics broadened to Vanc/Zosyn. She was extubated to HFNC 15 L by 09/06 and required 4 point restraints as well as precedex due to agitation and hallucinations. Fluid overload treated with IV diuresis and BIPAP prn. As mentation improved, she was weaned off precedex.  She was on tube feeding and this has been discontinued.  She had a MBS performed with recommendations of D2, Honey liquids due to silent aspiration and impulsivity.      Breakthrough seizure felt to be caused by [facility] use and seizure threshold lowered by Covid. Left thigh swelling felt to be due to traumatic rhabdomyolysis/muscle rupture and BLE duplex 09/10 were negative for DVT. Elevated troponins discussed with Dr. Elease Hashimoto who felt to be due to status, AKI v/s rhabdom as  2D echo  revealed EF 50-55% and no wall abnormality. Respiratory status stable and has been weaned to RA with intermittent tachycardia noted. Therapy has been working with patient who is limited by delay in processing with deconditioning --HR 120-140 w/activity,  cognitive deficits, weakness, .  Patient transferred to CIR on 03/03/2023 .    Patient  currently requires min with basic self-care skills secondary to muscle weakness, decreased cardiorespiratoy endurance, and decreased standing balance and decreased balance strategies.  Prior to hospitalization, patient could complete ADLs with independent  .  Patient will benefit from skilled intervention to decrease level of assist with basic self-care skills and increase independence with basic self-care skills prior to discharge home with care partner.  Anticipate patient will require intermittent supervision and follow up outpatient.  OT - End of Session Activity Tolerance: Tolerates 30+ min activity with multiple rests Endurance Deficit: Yes Endurance Deficit Description: limited by pain OT Assessment Rehab Potential (ACUTE ONLY): Good OT Patient demonstrates impairments in the following area(s): Balance;Cognition;Endurance;Pain;Safety OT Basic ADL's Functional Problem(s): Grooming;Bathing;Dressing;Toileting OT Transfers Functional Problem(s): Toilet;Tub/Shower OT Plan OT Intensity: Minimum of 1-2 x/day, 45 to 90 minutes OT Frequency: 5 out of 7 days OT Duration/Estimated Length of Stay: 7-10 days OT Treatment/Interventions: Balance/vestibular training;Discharge planning;Pain management;Self Care/advanced ADL retraining;Therapeutic Activities;Cognitive remediation/compensation;Functional mobility training;Patient/family education;Therapeutic Exercise;Community reintegration;DME/adaptive equipment instruction;Neuromuscular re-education;UE/LE Strength taining/ROM OT Self Feeding Anticipated Outcome(s): mod I OT Basic Self-Care Anticipated Outcome(s): mod I OT Toileting Anticipated Outcome(s): mod I OT Bathroom Transfers Anticipated Outcome(s): mod I OT Recommendation Recommendations for Other Services: Therapeutic Recreation consult Therapeutic Recreation Interventions: Pet therapy;Stress management;Outing/community reintergration Patient destination: Home Follow Up Recommendations: Outpatient OT Equipment Recommended: Tub/shower bench;To be determined   OT Evaluation Precautions/Restrictions  Precautions Precautions: Fall;Other (comment) Precaution Comments: watch sats and HR Restrictions Weight Bearing Restrictions:  No General Chart Reviewed: Yes Family/Caregiver Present: No Pain Pain Assessment Pain Scale: 0-10 Pain Score: 8  Pain Location: Leg Pain Orientation: Left Pain Onset: On-going Home Living/Prior Functioning Home Living Family/patient expects to be discharged to:: Private residence Living Arrangements: Children Available Help at Discharge: Family, Available 24 hours/day Type of Home: Mobile home Home Access: Stairs to enter [employer] of Steps: 3-4 Entrance Stairs-Rails: Right, Left, Can reach both Home Layout: One level Alternate Level Stairs-Number of Steps: flight Bathroom Shower/Tub: Associate Professor: Yes  Lives With: Family, Daughter IADL History Homemaking Responsibilities: Yes Meal Prep Responsibility: Primary Laundry Responsibility: Primary Cleaning Responsibility: Primary Bill Paying/Finance Responsibility: Primary Shopping Responsibility: Primary Child Care Responsibility: Primary Current License: No 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 Prior Function Level of Independence: Independent with gait, Independent with transfers  Able to Take Stairs?: Yes Driving: No Vocation: Full time employment Vocation Requirements: waitressing and stocking Leisure: Hobbies-yes (Comment) Vision Baseline Vision/History: 0 No visual deficits Ability to See in Adequate Light: 0 Adequate Patient Visual Report: No change from baseline;Other (comment) (repots one time blurryness in Rt eye) Vision Assessment?: No apparent visual deficits Perception  Perception: Within Functional Limits Praxis Praxis: WFL Cognition Cognition Overall Cognitive Status: Impaired/Different from baseline Arousal/Alertness: Awake/alert Memory: Impaired Memory Impairment: Storage deficit;Decreased recall of new information;Decreased short term memory Decreased Short Term Memory: Verbal basic;Verbal complex;Functional  basic Attention: Focused;Sustained;Selective Focused Attention: Appears intact Sustained Attention: Impaired Sustained Attention Impairment: Verbal basic;Verbal complex;Functional basic Selective Attention: Impaired Selective Attention Impairment: Verbal basic;Verbal complex;Functional basic Awareness: Appears intact Problem Solving: Impaired Problem Solving Impairment: Verbal complex;Functional basic Executive Function: Self Monitoring;Self Correcting;Reasoning Self Monitoring: Impaired Self Monitoring Impairment: Verbal complex;Functional basic Self Correcting: Impaired Self Correcting Impairment: Verbal complex;Functional basic Behaviors: Impulsive Safety/Judgment: Impaired Comments: slightly impulsive, did not adhere to original diet recommendations Brief Interview for Mental Status (BIMS) Repetition of Three Words (First Attempt): 3 Temporal Orientation: Year: Correct Temporal Orientation: Month: Accurate within 5 days Temporal Orientation: Day: Correct Recall: "Sock": Yes,  no cue required Recall: "Blue": Yes, no cue required Recall: "Bed": Yes, no cue required BIMS Summary Score: 15 Sensation Sensation Light Touch: Impaired Detail Peripheral sensation comments: LLE impaired, absent from hip to ankle, altered/hypersensitive L foot below ankle Light Touch Impaired Details: Impaired LLE Hot/Cold: Appears Intact Proprioception: Impaired by gross assessment Stereognosis: Not tested Coordination Gross Motor Movements are Fluid and Coordinated: Yes Fine Motor Movements are Fluid and Coordinated: Yes Motor  Motor Motor: Within Functional Limits  Trunk/Postural Assessment  Cervical Assessment Cervical Assessment: Within Functional Limits Thoracic Assessment Thoracic Assessment: Within Functional Limits Lumbar Assessment Lumbar Assessment: Within Functional Limits Postural Control Postural Control: Within Functional Limits  Balance Balance Balance Assessed: Yes Static  Sitting Balance Static Sitting - Balance Support: Feet unsupported;Feet supported Static Sitting - Level of Assistance: 5: Stand by assistance Dynamic Sitting Balance Dynamic Sitting - Balance Support: Feet supported Dynamic Sitting - Level of Assistance: 5: Stand by assistance Sitting balance - Comments: EOB completing sock management Static Standing Balance Static Standing - Balance Support: During functional activity Static Standing - Level of Assistance: 5: Stand by assistance Dynamic Standing Balance Dynamic Standing - Balance Support: During functional activity Dynamic Standing - Level of Assistance: 4: Min assist Dynamic Standing - Balance Activities: Reaching for objects Extremity/Trunk Assessment RUE Assessment RUE Assessment: Within Functional Limits LUE Assessment LUE Assessment: Within Functional Limits  Care Tool Care Tool Self Care Eating   Eating Assist Level: Set up assist    Oral Care    Oral Care Assist Level: Contact Guard/Toucning assist (standing)    Bathing   Body parts bathed by patient: Right arm;Right lower leg;Left lower leg;Face;Left arm;Chest;Front perineal area;Abdomen;Buttocks;Right upper leg;Left upper leg     Assist Level: Supervision/Verbal cueing    Upper Body Dressing(including orthotics)   What is the patient wearing?: Pull over shirt   Assist Level: Supervision/Verbal cueing    Lower Body Dressing (excluding footwear)   What is the patient wearing?: Underwear/pull up;Pants Assist for lower body dressing: Supervision/Verbal cueing    Putting on/Taking off footwear   What is the patient wearing?: Socks Assist for footwear: Supervision/Verbal cueing       Care Tool Toileting Toileting activity   Assist for toileting: Supervision/Verbal cueing     Care Tool Bed Mobility Roll left and right activity   Roll left and right assist level: Supervision/Verbal cueing    Sit to lying activity   Sit to lying assist level:  Supervision/Verbal cueing    Lying to sitting on side of bed activity   Lying to sitting on side of bed assist level: the ability to move from lying on the back to sitting on the side of the bed with no back support.: Supervision/Verbal cueing     Care Tool Transfers Sit to stand transfer   Sit to stand assist level: Contact Guard/Touching assist    Chair/bed transfer   Chair/bed transfer assist level: Contact Guard/Touching assist     Toilet transfer   Assist Level: Supervision/Verbal cueing     Care Tool Cognition  Expression of Ideas and Wants Expression of Ideas and Wants: 4. Without difficulty (complex and basic) - expresses complex messages without difficulty and with speech that is clear and easy to understand  Understanding Verbal and Non-Verbal Content Understanding Verbal and Non-Verbal Content: 4. Understands (complex and basic) - clear comprehension without cues or repetitions   Memory/Recall Ability Memory/Recall Ability : Staff names and faces;Current season;That he or she is in a hospital/hospital unit  Refer to Care Plan for Long Term Goals  SHORT TERM GOAL WEEK 1 OT Short Term Goal 1 (Week 1): STG=LTG d/t ELOS  Recommendations for other services: Therapeutic Recreation  Pet therapy   Skilled Therapeutic Intervention ADL ADL Eating: Set up Grooming: Contact guard Where Assessed-Grooming: Standing at sink Upper Body Bathing: Supervision/safety Where Assessed-Upper Body Bathing: Shower Lower Body Bathing: Supervision/safety Where Assessed-Lower Body Bathing: Shower Upper Body Dressing: Supervision/safety Where Assessed-Upper Body Dressing: Other (Comment) (standing) Lower Body Dressing: Supervision/safety Where Assessed-Lower Body Dressing: Wheelchair Toileting: Supervision/safety Where Assessed-Toileting: Teacher, adult education: Close supervision Toilet Transfer Method: Ambulance person: Grab bars Tub/Shower Transfer: Psychologist, educational Method: Engineer, technical sales: Designer, multimedia: Administrator, arts Method: Stand pivot Mobility  Bed Mobility Bed Mobility: Sit to Supine;Supine to Sit Supine to Sit: Supervision/Verbal cueing Sit to Supine: Supervision/Verbal cueing Transfers Sit to Stand: Supervision/Verbal cueing Stand to Sit: Supervision/Verbal cueing   1:1 evaluation and treatment session initiated this date. OT roles, goals and purpose discussed with pt as well as therapy schedule. ADL completed this date with levels of assist listed above. Pt demonstrating slight impulsivity during eval. Pt able to don shirt in standing and complete grooming in standing. Pt demonstrated slight endurance deficit after ADL. Pt would benefit from skilled OT in IPR setting in order to maximize independence with ADLs upon D/C.    Discharge Criteria: Patient will be discharged from OT if patient refuses treatment 3 consecutive times without medical reason, if treatment goals not met, if there is a change in medical status, if patient makes no progress towards goals or if patient is discharged from hospital.  The above assessment, treatment plan, treatment alternatives and goals were discussed and mutually agreed upon: by patient  Velia Meyer, OTD, OTR/L 03/04/2023, 3:53 PM

## 2023-03-04 NOTE — Plan of Care (Signed)
  Problem: RH Grooming Goal: LTG Patient will perform grooming w/assist,cues/equip (OT) Description: LTG: Patient will perform grooming with assist, with/without cues using equipment (OT) Flowsheets (Taken 03/04/2023 1542) LTG: Pt will perform grooming with assistance level of: Independent with assistive device    Problem: RH Bathing Goal: LTG Patient will bathe all body parts with assist levels (OT) Description: LTG: Patient will bathe all body parts with assist levels (OT) Flowsheets (Taken 03/04/2023 1542) LTG: Pt will perform bathing with assistance level/cueing: Independent with assistive device    Problem: RH Dressing Goal: LTG Patient will perform upper body dressing (OT) Description: LTG Patient will perform upper body dressing with assist, with/without cues (OT). Flowsheets (Taken 03/04/2023 1542) LTG: Pt will perform upper body dressing with assistance level of: Independent with assistive device Goal: LTG Patient will perform lower body dressing w/assist (OT) Description: LTG: Patient will perform lower body dressing with assist, with/without cues in positioning using equipment (OT) Flowsheets (Taken 03/04/2023 1542) LTG: Pt will perform lower body dressing with assistance level of: Independent with assistive device   Problem: RH Toileting Goal: LTG Patient will perform toileting task (3/3 steps) with assistance level (OT) Description: LTG: Patient will perform toileting task (3/3 steps) with assistance level (OT)  Flowsheets (Taken 03/04/2023 1542) LTG: Pt will perform toileting task (3/3 steps) with assistance level: Independent with assistive device   Problem: RH Toilet Transfers Goal: LTG Patient will perform toilet transfers w/assist (OT) Description: LTG: Patient will perform toilet transfers with assist, with/without cues using equipment (OT) Flowsheets (Taken 03/04/2023 1542) LTG: Pt will perform toilet transfers with assistance level of: Independent with assistive  device   Problem: RH Tub/Shower Transfers Goal: LTG Patient will perform tub/shower transfers w/assist (OT) Description: LTG: Patient will perform tub/shower transfers with assist, with/without cues using equipment (OT) Flowsheets (Taken 03/04/2023 1542) LTG: Pt will perform tub/shower stall transfers with assistance level of: Independent with assistive device

## 2023-03-04 NOTE — Plan of Care (Signed)
  Problem: RH Problem Solving Goal: LTG Patient will demonstrate problem solving for (SLP) Description: LTG:  Patient will demonstrate problem solving for basic/complex daily situations with cues  (SLP) Flowsheets (Taken 03/04/2023 1626) LTG: Patient will demonstrate problem solving for (SLP):  Complex daily situations  Basic daily situations LTG Patient will demonstrate problem solving for: Modified Independent Note: Mildly complex situations   Problem: RH Memory Goal: LTG Patient will demonstrate ability for day to day (SLP) Description: LTG:   Patient will demonstrate ability for day to day recall/carryover during cognitive/linguistic activities with assist  (SLP) Flowsheets (Taken 03/04/2023 1626) LTG: Patient will demonstrate ability for day to day recall:  New information  Other (Comment) LTG: Patient will demonstrate ability for day to day recall/carryover during cognitive/linguistic activities with assist (SLP): Supervision Note: Recent/relevant info    Problem: RH Attention Goal: LTG Patient will demonstrate this level of attention during functional activites (SLP) Description: LTG:  Patient will will demonstrate this level of attention during functional activites (SLP) Flowsheets (Taken 03/04/2023 1626) Patient will demonstrate during cognitive/linguistic activities the attention type of: Sustained Patient will demonstrate this level of attention during cognitive/linguistic activities in: Controlled LTG: Patient will demonstrate this level of attention during cognitive/linguistic activities with assistance of (SLP): Modified Independent Number of minutes patient will demonstrate attention during cognitive/linguistic activities: 20   Problem: RH Pre-functional/Other (Specify) Goal: RH LTG SLP (Specify) 1 Description: RH LTG SLP (Specify) 1 Flowsheets (Taken 03/04/2023 1626) LTG: Other SLP (Specify) 1: Pt will process mildly complex information during structured and functional  cognitive tasks @ modI

## 2023-03-04 NOTE — Evaluation (Signed)
Physical Therapy Assessment and Plan  Patient Details  Name: Colleen Pearson MRN: [id number] Date of Birth: [birth year]  PT Diagnosis: Abnormality of gait, Difficulty walking, Impaired cognition, Impaired sensation, Muscle weakness, and Pain in LLE Rehab Potential: Good ELOS: 7-10 days, pending speech therapy and medical needs   Today's Date: 03/04/2023 PT Individual Time: 0905-1006 PT Individual Time Calculation (min): 61 min    Hospital Problem: Principal Problem:   Debility Active Problems:   Seizures (HCC)   Delirium   Dysphagia   Polysubstance abuse (HCC)   Left thigh pain   Tachycardia   Past Medical History:  Past Medical History:  Diagnosis Date   Asthma    Cocaine use complicating pregnancy in third trimester 08/30/2017   Endometriosis    Ovarian cyst    Seizures (HCC)    Status post cesarean section 08/30/2017   Past Surgical History:  Past Surgical History:  Procedure Laterality Date   CESAREAN SECTION N/A 08/30/2017   Procedure: CESAREAN SECTION;  Surgeon: Conard Novak, MD;  Location: [facility] ORS;  Service: Obstetrics;  Laterality: N/A;   LASER ABLATION/CAUTERIZATION OF ENDOMETRIAL IMPLANTS      Assessment & Plan Clinical Impression: 32 year old female with a history of epilepsy for which she take Keppra. Last breakthrough seizure 6/24. Presented on 02/22/23 for prolonged seizure. Required multiple doses of midazolam pre hospital. Unresponsive on arrival and intubated.   Neurology consulted. Started on ceftriaxone for possible aspiration. EEG suggesting focal seizure. Extubated on 9/3. Failed extubation and re intubated on 9/4. Found to be COVID positive.  Eventual extubation on 9/6. To continue on Keppra and Lamictal. Acute delirium initially but now resolving . Weaned off precedex but remains on Seroquel and prn ativan and haldol. Left thigh pain and swelling felt related to traumatic rhabdomyolysis and rupture of the muscled. Negative xray. Tracheal  aspirate MRSA positive. Weaned off oxygen. On linezolid to complete 7 days of therapy. ON IV dexamethasone for 7 days of therapy. CK elevated but improving. IVF discontinued. Initially on tube feeds and seen by SLP. Discontinued and now on dysphagia diet. Lovenox for DVT prophylaxis.  Patient transferred to CIR on 03/03/2023 .   Patient currently requires min with mobility secondary to muscle weakness, decreased cardiorespiratoy endurance, decreased attention and decreased memory, and decreased balance strategies.  Prior to hospitalization, patient was independent  with mobility and lived with Family, Daughter (primary caregiver for 5yo daughter) in a House home.  Home access is 3-4Stairs to enter.  Patient will benefit from skilled PT intervention to maximize safe functional mobility, minimize fall risk, and decrease caregiver burden for planned discharge home with 24 hour supervision.  Anticipate patient will benefit from follow up HH at discharge.  PT - End of Session Activity Tolerance: Tolerates 30+ min activity with multiple rests Endurance Deficit: Yes Endurance Deficit Description: limited by pain and respiratory deficits PT Assessment Rehab Potential (ACUTE/IP ONLY): Good PT Barriers to Discharge: Home environment access/layout;Decreased caregiver support PT Patient demonstrates impairments in the following area(s): Balance;Pain;Safety;Sensory;Endurance PT Transfers Functional Problem(s): Bed Mobility;Bed to Chair;Car;Furniture;Floor PT Locomotion Functional Problem(s): Ambulation;Stairs;Wheelchair Mobility PT Plan PT Intensity: Minimum of 1-2 x/day ,45 to 90 minutes PT Frequency: Total of 15 hours over 7 days of combined therapies PT Duration Estimated Length of Stay: 7-10 days, pending speech therapy and medical needs PT Treatment/Interventions: Ambulation/gait training;Cognitive remediation/compensation;Discharge planning;DME/adaptive equipment instruction;Functional mobility  training;Pain management;Psychosocial support;Splinting/orthotics;Therapeutic Activities;UE/LE Strength taining/ROM;Visual/perceptual remediation/compensation;Wheelchair propulsion/positioning;UE/LE Coordination activities;Therapeutic Exercise;Stair training;Skin care/wound management;Patient/family education;Functional electrical stimulation;Neuromuscular re-education;Disease management/prevention;Community reintegration;Balance/vestibular  training PT Transfers Anticipated Outcome(s): supervision for safety awareness with LRAD PT Locomotion Anticipated Outcome(s): supervision for safety awareness with LRAD PT Recommendation Recommendations for Other Services: Neuropsych consult Follow Up Recommendations: Outpatient PT;Home health PT Patient destination: Home Equipment Recommended: To be determined;Rolling walker with 5" wheels Equipment Details: likely youth RW   PT Evaluation Precautions/Restrictions Precautions Precautions: Fall;Other (comment) Precaution Comments: watch sats and HR Restrictions Weight Bearing Restrictions: No General   Vital Signs  Pain   Pain Interference Pain Interference Pain Effect on Sleep: 4. Almost constantly Pain Interference with Therapy Activities: 4. Almost constantly Pain Interference with Day-to-Day Activities: 4. Almost constantly Home Living/Prior Functioning Home Living Available Help at Discharge: Family (plans to stay with mom who normally watches her daughter while pt works, can provide 24 hr assist) Type of Home: House Home Access: Stairs to enter [employer] of Steps: 3-4 Entrance Stairs-Rails: Right;Left;Can reach both Home Layout: Two level;Able to live on main level with bedroom/bathroom Alternate Level Stairs-Number of Steps: flight  Lives With: Family;Daughter (primary caregiver for 5yo daughter) Prior Function Level of Independence: Independent with gait;Independent with transfers  Able to Take Stairs?: Yes Driving:  No Vocation: Full time employment Vocation Requirements: waitressing and stocking Vision/Perception  Vision - History Ability to See in Adequate Light: 0 Adequate Perception Perception: Within Functional Limits Praxis Praxis: WFL  Cognition Overall Cognitive Status: Impaired/Different from baseline Arousal/Alertness: Awake/alert Orientation Level: Oriented X4 Memory: Impaired (pt reports difficulty with short term and long term memory) Sensation Sensation Light Touch: Impaired Detail Peripheral sensation comments: LLE impaired, absent from hip to ankle, altered/hypersensitive L foot below ankle Light Touch Impaired Details: Impaired LLE Hot/Cold: Not tested Proprioception: Impaired by gross assessment Stereognosis: Not tested Coordination Gross Motor Movements are Fluid and Coordinated: Yes Motor  Motor Motor: Within Functional Limits   Trunk/Postural Assessment  Cervical Assessment Cervical Assessment: Within Functional Limits Thoracic Assessment Thoracic Assessment: Within Functional Limits Lumbar Assessment Lumbar Assessment: Within Functional Limits Postural Control Postural Control: Within Functional Limits  Balance Balance Balance Assessed: Yes Static Sitting Balance Static Sitting - Balance Support: Feet unsupported;Feet supported Static Sitting - Level of Assistance: 5: Stand by assistance Dynamic Sitting Balance Dynamic Sitting - Balance Support: Feet supported Dynamic Sitting - Level of Assistance: 5: Stand by assistance Static Standing Balance Static Standing - Balance Support: During functional activity Static Standing - Level of Assistance: 5: Stand by assistance Dynamic Standing Balance Dynamic Standing - Balance Support: During functional activity Dynamic Standing - Level of Assistance: 4: Min assist Extremity Assessment      RLE Assessment RLE Assessment: Within Functional Limits General Strength Comments: Grossly 5/5 LLE Assessment LLE  Assessment: Exceptions to [facility] General Strength Comments: hip 2+/5, knee and ankle 3+/5 limited by pain  Care Tool Care Tool Bed Mobility Roll left and right activity   Roll left and right assist level: Supervision/Verbal cueing    Sit to lying activity   Sit to lying assist level: Supervision/Verbal cueing    Lying to sitting on side of bed activity   Lying to sitting on side of bed assist level: the ability to move from lying on the back to sitting on the side of the bed with no back support.: Supervision/Verbal cueing     Care Tool Transfers Sit to stand transfer   Sit to stand assist level: Contact Guard/Touching assist    Chair/bed transfer   Chair/bed transfer assist level: Contact Guard/Touching assist     Toilet transfer  Licensed conveyancer transfer activity did not occur: Safety/medical concerns (covid restrictions)        Care Tool Locomotion Ambulation   Assist level: Contact Guard/Touching assist Assistive device: Walker-rolling Max distance: 50 ft  Walk 10 feet activity   Assist level: Contact Guard/Touching assist Assistive device: Walker-rolling   Walk 50 feet with 2 turns activity   Assist level: Contact Guard/Touching assist    Walk 150 feet activity Walk 150 feet activity did not occur: Safety/medical concerns (covid restrictions, could not leave room)      Walk 10 feet on uneven surfaces activity Walk 10 feet on uneven surfaces activity did not occur: Safety/medical concerns (covid restrictions, could not leave room)      Stairs Stair activity did not occur: Safety/medical concerns (covid restrictions, could not leave room)        Walk up/down 1 step activity Walk up/down 1 step or curb (drop down) activity did not occur: Safety/medical concerns (covid restrictions, could not leave room)      Walk up/down 4 steps activity Walk up/down 4 steps activity did not occur: Safety/medical concerns (covid restrictions, could not leave room)       Walk up/down 12 steps activity Walk up/down 12 steps activity did not occur: Safety/medical concerns (covid restrictions, could not leave room)      Pick up small objects from floor   Pick up small object from the floor assist level: Minimal Assistance - Patient > 75%    Wheelchair Is the patient using a wheelchair?: No          Wheel 50 feet with 2 turns activity      Wheel 150 feet activity        Refer to Care Plan for Long Term Goals  SHORT TERM GOAL WEEK 1 PT Short Term Goal 1 (Week 1): =LTGs d/t ELOS  Recommendations for other services: None   Skilled Therapeutic Intervention Evaluation completed (see details above) with patient education regarding purpose of PT evaluation, PT POC and goals, therapy schedule, weekly team meetings, and other CIR information including safety plan and fall risk safety. Motor and sensory testing in sitting. Pt performed the below functional mobility tasks with the specified levels of skilled cuing and assistance.  Limited eval d/t covid precautions. Pt does report 8/10  LLE, but was able to mobilize without pain limiting this session. Rest and positioning as needed. Does demo impulsivity and decr safety awareness, reports fell twice in acute but that she will not do that again because she doesn't want to fall again. Pt became emotional about family dynamics during session, emotional support provided. Pt remained seated in w/c at end of session and was left with all needs in reach and alarm active.   Mobility Bed Mobility Bed Mobility: Sit to Supine;Supine to Sit Supine to Sit: Supervision/Verbal cueing Sit to Supine: Supervision/Verbal cueing Transfers Transfers: Stand to Sit;Sit to Stand Sit to Stand: Contact Guard/Touching assist Stand to Sit: Contact Guard/Touching assist Transfer (Assistive device): Rolling walker Locomotion  Gait Ambulation: Yes Gait Assistance: Contact Guard/Touching assist Gait Distance (Feet): 40 Feet Assistive  device: Rolling walker Gait Gait: Yes Gait Pattern: Antalgic Gait velocity: decreased Stairs / Additional Locomotion Stairs: No Wheelchair Mobility Wheelchair Mobility: No   Discharge Criteria: Patient will be discharged from PT if patient refuses treatment 3 consecutive times without medical reason, if treatment goals not met, if there is a change in medical status, if patient makes no progress towards goals or if patient is  discharged from hospital.  The above assessment, treatment plan, treatment alternatives and goals were discussed and mutually agreed upon: by patient  Juluis Rainier 03/04/2023, 10:35 AM

## 2023-03-04 NOTE — Discharge Instructions (Addendum)
Inpatient Rehab Discharge Instructions  Colleen Pearson Discharge date and time:    Activities/Precautions/ Functional Status: Activity: no lifting, driving, or strenuous exercise till cleared by MD Diet: regular --limit distractions at meals Wound Care: Desitin or Zinc oxide to sacrum    Functional status:  ___ No restrictions     ___ Walk up steps independently ___ 24/7 supervision/assistance   ___ Walk up steps with assistance _X__ Intermittent supervision/assistance  ___ Bathe/dress independently ___ Walk with walker     ___ Bathe/dress with assistance ___ Walk Independently    _X__ Shower independently ___ Walk with assistance    ___ Shower with assistance _X__ No alcohol     ___ Return to work/school ________  Special Instructions:   COMMUNITY REFERRALS UPON DISCHARGE:    Outpatient: PT     [facility]             Agency:[facility]    Phone:[phone]              Appointment Date/Time:*Please expect follow-up within in 7-10 business days to schedule your appointment. If you have not received follow-up, be sure to contact the site directly.*  Medical Equipment/Items Ordered:tub transfer bench                                                 Agency/Supplier:[facility] [employer] [phone]    Per [facility] statutes, patients with seizures are not allowed to drive until  they have been seizure-free for six months. Use caution when using heavy equipment or power tools. Avoid working on ladders or at heights. Take showers instead of baths. Ensure the water temperature is not too high on the home water heater. Do not go swimming alone. When caring for infants or small children, sit down when holding, feeding, or changing them to minimize risk of injury to the child in the event you have a seizure. Also, Maintain good sleep hygiene. Avoid alcohol.   My questions have been answered and I understand these instructions. I will adhere to these goals and the provided  educational materials after my discharge from the hospital.  Patient/Caregiver Signature _______________________________ Date __________  Clinician Signature _______________________________________ Date __________  Please bring this form and your medication list with you to all your follow-up doctor's appointments.

## 2023-03-04 NOTE — Evaluation (Addendum)
Speech Language Pathology Assessment and Plan  Patient Details  Name: Colleen Pearson MRN: [id number] Date of Birth: 1990-11-16  SLP Diagnosis: Cognitive Impairments;Speech and Language deficits;Dysphagia  Rehab Potential: Good ELOS: 7-10 days    Today's Date: 03/04/2023 SLP Individual Time: 1000-1100 SLP Individual Time Calculation (min): 60 min   Hospital Problem: Principal Problem:   Debility Active Problems:   Seizures (HCC)   Delirium   Dysphagia   Polysubstance abuse (HCC)   Left thigh pain   Tachycardia  Past Medical History:  Past Medical History:  Diagnosis Date   Asthma    Cocaine use complicating pregnancy in third trimester 08/30/2017   Endometriosis    Ovarian cyst    Seizures (HCC)    Status post cesarean section 08/30/2017   Past Surgical History:  Past Surgical History:  Procedure Laterality Date   CESAREAN SECTION N/A 08/30/2017   Procedure: CESAREAN SECTION;  Surgeon: Conard Novak, MD;  Location: [facility] ORS;  Service: Obstetrics;  Laterality: N/A;   LASER ABLATION/CAUTERIZATION OF ENDOMETRIAL IMPLANTS      Assessment / Plan / Recommendation Clinical Impression  Pt is a 32 year old female with history of asthma, polysubstance abuse, seizures on Keppra with last breakthrough seizure 06/24 who was admitted on 02/22/23 with status epilepticus and seizure lasting >45 minutes. She required multiple doses of midazolam, intubated, unresponsive and loaded with IV Keppra and intermittent hypotension treated with 4 L fluids. UDS positive for benzos and THC. CT head negative. She was started on Ceftriaxone for possible aspiration. She developed fever T 102 with posturing on 09/02 and CTA head/chest was negative for stenosis, occlusions but showed abnormal upper lung thickening with confluent airspace opacity L>R  with question of aspiration.  Rapid EEG showed concern of focal seizure in right temporal region. Lamictal resumed and loaded with phenobarb which was  weaned off by 09/07. AKI with severe with rhabdomyolysis treated with bicarb drip.    LT-EEG showed severe diffuse encephalopathy, sedation weaned off and she was briefly extubated on 09/03. She had sudden decline with hypercapnic respiratory failure early am of 09/04 requiring reintubation and was found to be Covid positive. She underwent bronchoscopy revealing tenacious secretions throughout airway and underwent therapeutic aspiration in RLL/LLL. Antibiotics broadened to Vanc/Zosyn. She was extubated to HFNC 15 L by 09/06 and required 4 point restraints as well as precedex due to agitation and hallucinations. Fluid overload treated with IV diuresis and BIPAP prn. As mentation improved, she was weaned off precedex.  She was on tube feeding and this has been discontinued.  She had a MBS performed with recommendations of D2, Honey liquids due to silent aspiration and impulsivity. Breakthrough seizure felt to be caused by [place] use and seizure threshold lowered by Covid.   Pt presented w/ mild cognitive-linguistic deficits overall. Noted to score 22/30 on SLUMS Exam, which is improved from 18/30 on 9/10. WFL is 25-30 given her 8th grade education. She demonstrated adequate orientation (despite missing the day of the week) and recall of recent medical events. She presented w/ deficits in attention, information processing, and memory throughout tasks. Additionally, she reported thought formulation deficits, mildly turbulent emotions, and "just feeling off." She demonstrated adequate awareness re her current cognitive-linguistic deficits and their potential impact on her return to prev roles/responsibilities.   Bedside swallow eval completed w/ honey thick liquids, nectar thick liquids, thin liquids, and Dys 3 (graham crackers). Intermittent coughing and wet vocal quality noted prior to PO trials, suspect potentially d/t progression of  COVID and continued laryngeal irritation. No overt s/s of airway invasion noted w/  Dys 3 textures and pt presented w/ adequate oral clearance. She was able to recall education provided by previous SLP re silent aspiration, swallow strategies, and aspiration. However, reported that she has not followed recommendation of honey thick liquids and has been drinking thin liquids from the sink. During liquid trials, pt presented w/ gagging/coughing w/ honey thick and nectar thick liquids. Suspect d/t taste vs true aspiration given obvious disgust. No overt s/s of airway invasion noted w/ thin liquids via cup and use of chin tuck, however, hx of silent aspiration noted on initial MBSS 9/9. After initial cue from SLP, pt was able to utilize chin tuck independently. Adequate rate and sip size noted. Additionally, she was able to verbalize understanding re recommendations for no straws, silent aspiration, and continued education re aspiration PNA and demonstrated adequate reasoning throughout conversation. Given lack of compliance w/ current recommendations and improved safety awareness, recommend upgrade to thin liquids and Dys 3 textures. No straws and chin tuck with liquids. Meds whole in puree. Intermittent supervision during meals to ensure use of compensatory swallow strategies.   She would benefit from skilled ST services to target cognitive-linguistic deficits and dysphagia to maximize pt independence, maintain nutrition/hydration needs w/ least restrictive diet, and facilitate return to prev roles/responsibilities.     Skilled Therapeutic Interventions          Cognitive-linguistic and bedside swallow eval completed. See above for more information.   SLP Assessment  Patient will need skilled Speech Lanaguage Pathology Services during CIR admission    Recommendations  SLP Diet Recommendations: Dysphagia 3 (Mech soft);Thin Liquid Administration via: Cup Medication Administration: Whole meds with puree Supervision: Patient able to self feed;Intermittent supervision to cue for compensatory  strategies Compensations: Minimize environmental distractions;Slow rate;Small sips/bites;Chin tuck Postural Changes and/or Swallow Maneuvers: Seated upright 90 degrees Oral Care Recommendations: Oral care BID Recommendations for Other Services: Neuropsych consult;Therapeutic Recreation consult Therapeutic Recreation Interventions: Pet therapy;Stress management Patient destination: Home Follow up Recommendations: Outpatient SLP Equipment Recommended: To be determined    SLP Frequency Total of 15 hours over 7 days of combined therapies (d/t current medical status)  SLP Duration  SLP Intensity  SLP Treatment/Interventions 7-10 days  Minumum of 1-2 x/day, 30 to 90 minutes  Dysphagia/aspiration precaution training;Speech/Language facilitation;Cueing hierarchy;Environmental controls;Oral motor exercises;Functional tasks;Patient/family education;Cognitive remediation/compensation    Pain Pain Assessment Pain Scale: 0-10 Pain Score: 8  Pain Location: Leg Pain Orientation: Left Pain Onset: On-going  Prior Functioning Type of Home: Mobile home  Lives With: Family;Daughter Available Help at Discharge: Family;Available 24 hours/day Vocation: Full time employment  SLP Evaluation Cognition Overall Cognitive Status: Impaired/Different from baseline Arousal/Alertness: Awake/alert Orientation Level: Oriented X4 Year: 2024 Month: September Day of Week: Incorrect Attention: Focused;Sustained;Selective Focused Attention: Appears intact Sustained Attention: Impaired Sustained Attention Impairment: Verbal basic;Verbal complex;Functional basic Selective Attention: Impaired Selective Attention Impairment: Verbal basic;Verbal complex;Functional basic Memory: Impaired Memory Impairment: Storage deficit;Decreased recall of new information;Decreased short term memory Decreased Short Term Memory: Verbal basic;Verbal complex;Functional basic Awareness: Appears intact Problem Solving:  Impaired Problem Solving Impairment: Verbal complex;Functional basic Executive Function: Self Monitoring;Self Correcting;Reasoning Self Monitoring: Impaired Self Monitoring Impairment: Verbal complex;Functional basic Self Correcting: Impaired Self Correcting Impairment: Verbal complex;Functional basic Behaviors: Impulsive Safety/Judgment: Impaired Comments: slightly impulsive, did not adhere to original diet recommendations  Comprehension Auditory Comprehension Overall Auditory Comprehension: Appears within functional limits for tasks assessed Expression Expression Primary Mode of Expression: Verbal Verbal Expression Overall Verbal  Expression: Appears within functional limits for tasks assessed Written Expression Dominant Hand: Right Written Expression: Exceptions to [facility] Interfering Components: Spatial organization;Legibility Oral Motor Oral Motor/Sensory Function Overall Oral Motor/Sensory Function: Within functional limits Motor Speech Overall Motor Speech: Impaired Respiration: Within functional limits Phonation: Hoarse;Wet (intermittently wet d/t mucous) Resonance: Within functional limits Articulation: Within functional limitis Intelligibility: Intelligible Motor Planning: Witnin functional limits Motor Speech Errors: Not applicable  Care Tool Care Tool Cognition Ability to hear (with hearing aid or hearing appliances if normally used Ability to hear (with hearing aid or hearing appliances if normally used): 0. Adequate - no difficulty in normal conservation, social interaction, listening to TV   Expression of Ideas and Wants Expression of Ideas and Wants: 3. Some difficulty - exhibits some difficulty with expressing needs and ideas (e.g, some words or finishing thoughts) or speech is not clear   Understanding Verbal and Non-Verbal Content Understanding Verbal and Non-Verbal Content: 4. Understands (complex and basic) - clear comprehension without cues or repetitions   Memory/Recall Ability Memory/Recall Ability : Staff names and faces;Current season;That he or she is in a hospital/hospital unit   Motor Speech Assessment  Intelligibility: Intelligible  Bedside Swallowing Assessment General Date of Onset: 02/22/23 Previous Swallow Assessment: MBSS 9/09 revealed moderate oropharyngeal dysphagia. Recommended Dys 2 textures and honey thick liquids. Diet Prior to this Study: Dysphagia 2 (finely chopped);Moderately thick liquids (Level 3, honey thick) Temperature Spikes Noted: No Respiratory Status: Room air History of Recent Intubation: Yes Total duration of intubation (days): 3 days Date extubated: 02/26/23 Behavior/Cognition: Alert;Cooperative;Pleasant mood Oral Cavity - Dentition: Adequate natural dentition Self-Feeding Abilities: Able to feed self Vision: Functional for self-feeding Patient Positioning: Upright in chair/Tumbleform Baseline Vocal Quality: Hoarse;Wet Volitional Cough: Congested Volitional Swallow: Able to elicit    Short Term Goals: Week 1: SLP Short Term Goal 1 (Week 1): STGs = LTGs d/t ELOS  Refer to Care Plan for Long Term Goals  Recommendations for other services: Neuropsych and Therapeutic Recreation  Pet therapy  Discharge Criteria: Patient will be discharged from SLP if patient refuses treatment 3 consecutive times without medical reason, if treatment goals not met, if there is a change in medical status, if patient makes no progress towards goals or if patient is discharged from hospital.  The above assessment, treatment plan, treatment alternatives and goals were discussed and mutually agreed upon: by patient  Pati Gallo 03/04/2023, 3:47 PM

## 2023-03-04 NOTE — Progress Notes (Addendum)
Patient ID: Colleen Pearson, female   DOB: 03/31/1991, 31 y.o.   MRN: [id number]   1022-SW left message for pt mother Selena Batten to introduce self, explain role, and discuss discharge process. SW informed will follow-up once there is more information.   *SW spoke with pt mother to introduce self, explain role, and discuss discharge process. She confirms that pt can return to her home, however, is aware pt may go to her father's home. She is aware SW will follow-up with updates.   Cecile Sheerer, MSW, LCSWA Office: [phone] Cell: [phone] Fax: [phone]

## 2023-03-05 DIAGNOSIS — R569 Unspecified convulsions: Secondary | ICD-10-CM | POA: Diagnosis not present

## 2023-03-05 DIAGNOSIS — G9341 Metabolic encephalopathy: Secondary | ICD-10-CM | POA: Diagnosis present

## 2023-03-05 LAB — CBC WITH DIFFERENTIAL/PLATELET
Abs Immature Granulocytes: 0.16 10*3/uL — ABNORMAL HIGH (ref 0.00–0.07)
Basophils Absolute: 0 10*3/uL (ref 0.0–0.1)
Basophils Relative: 0 %
Eosinophils Absolute: 0.2 10*3/uL (ref 0.0–0.5)
Eosinophils Relative: 2 %
HCT: 31 % — ABNORMAL LOW (ref 36.0–46.0)
Hemoglobin: 10.7 g/dL — ABNORMAL LOW (ref 12.0–15.0)
Immature Granulocytes: 2 %
Lymphocytes Relative: 15 %
Lymphs Abs: 1.2 10*3/uL (ref 0.7–4.0)
MCH: 29.6 pg (ref 26.0–34.0)
MCHC: 34.5 g/dL (ref 30.0–36.0)
MCV: 85.9 fL (ref 80.0–100.0)
Monocytes Absolute: 0.9 10*3/uL (ref 0.1–1.0)
Monocytes Relative: 11 %
Neutro Abs: 5.6 10*3/uL (ref 1.7–7.7)
Neutrophils Relative %: 70 %
Platelets: 621 10*3/uL — ABNORMAL HIGH (ref 150–400)
RBC: 3.61 MIL/uL — ABNORMAL LOW (ref 3.87–5.11)
RDW: 12.7 % (ref 11.5–15.5)
WBC: 7.9 10*3/uL (ref 4.0–10.5)
nRBC: 0 % (ref 0.0–0.2)

## 2023-03-05 LAB — CK: Total CK: 1625 U/L — ABNORMAL HIGH (ref 38–234)

## 2023-03-05 NOTE — Progress Notes (Signed)
PROGRESS NOTE   Subjective/Complaints:   Pt reports LBM yesterday Having hiccups a lot.  Slept much better Cannot feel LLE_ completely absent Overall though, doing better Wants 32 yr old daughter to visit today- we discussed I'd rather it be tomorrow since still on covid precautions.    ROS:   Pt denies SOB, abd pain, CP, N/V/C/D, and vision changes  Except for HPI  Objective:   No results found. Recent Labs    03/04/23 0758  WBC 15.3*  HGB 11.9*  HCT 36.0  PLT 711*   Recent Labs    03/03/23 1137 03/04/23 0758  NA 135 137  K 4.0 4.0  CL 105 101  CO2 19* 20*  GLUCOSE 119* 92  BUN 19 16  CREATININE 0.85 0.88  CALCIUM 8.4* 8.7*    Intake/Output Summary (Last 24 hours) at 03/05/2023 1650 Last data filed at 03/05/2023 1500 Gross per 24 hour  Intake 594 ml  Output --  Net 594 ml        Physical Exam: Vital Signs Blood pressure 126/77, pulse (!) 109, temperature 97.9 F (36.6 C), temperature source Oral, resp. rate 16, height 5\' 1"  (1.549 m), weight 71.5 kg, SpO2 100%.    General: awake, alert, appropriate, sitting up in bed; holding knees; NAD HENT: conjugate gaze; oropharynx moist CV: regular rhythm, tachycardic rate - in 100's; no JVD Pulmonary: CTA B/L; no W/R/R- good air movement GI: soft, NT, ND, (+)BS Psychiatric: appropriate Neurological: alert- delayed responses Skin: Clean and intact without signs of breakdown, small bruises noted in bilateral upper extremities Neuro:  Alert and oriented x4, follows commands, CN 2-12 intact, no dysarthria, soft voice, fair memory RUE: 5/5 Deltoid, 5/5 Biceps, 5/5 Triceps, 5/5 Wrist Ext, 5/5 Grip LUE: 5/5 Deltoid, 5/5 Biceps, 5/5 Triceps, 5/5 Wrist Ext, 5/5 Grip RLE: HF 5/5, KE 5/5,  ADF 5/5, APF 5/5 LLE: HF 2/5, KE 4-/5, ADF 5/5, APF 5/5 Sensory exam normal for light touch and pain in all 4 limbs. No limb ataxia or cerebellar signs. No abnormal tone  appreciated.    Musculoskeletal: mild C spine paraspinal tenderness, L thigh with tenderness and pain with PROM, mild R wrist tenderness RUE IV- looks ok  Assessment/Plan: 1. Functional deficits which require 3+ hours per day of interdisciplinary therapy in a comprehensive inpatient rehab setting. Physiatrist is providing close team supervision and 24 hour management of active medical problems listed below. Physiatrist and rehab team continue to assess barriers to discharge/monitor patient progress toward functional and medical goals  Care Tool:  Bathing    Body parts bathed by patient: Right arm, Right lower leg, Left lower leg, Face, Left arm, Chest, Front perineal area, Abdomen, Buttocks, Right upper leg, Left upper leg         Bathing assist Assist Level: Supervision/Verbal cueing     Upper Body Dressing/Undressing Upper body dressing   What is the patient wearing?: Pull over shirt    Upper body assist Assist Level: Supervision/Verbal cueing    Lower Body Dressing/Undressing Lower body dressing      What is the patient wearing?: Underwear/pull up, Pants     Lower body assist Assist for lower body dressing:  Supervision/Verbal cueing     Toileting Toileting    Toileting assist Assist for toileting: Supervision/Verbal cueing     Transfers Chair/bed transfer  Transfers assist     Chair/bed transfer assist level: Contact Guard/Touching assist     Locomotion Ambulation   Ambulation assist      Assist level: Contact Guard/Touching assist Assistive device: Walker-rolling Max distance: 50 ft   Walk 10 feet activity   Assist     Assist level: Contact Guard/Touching assist Assistive device: Walker-rolling   Walk 50 feet activity   Assist    Assist level: Contact Guard/Touching assist      Walk 150 feet activity   Assist Walk 150 feet activity did not occur: Safety/medical concerns (covid restrictions, could not leave room)          Walk 10 feet on uneven surface  activity   Assist Walk 10 feet on uneven surfaces activity did not occur: Safety/medical concerns (covid restrictions, could not leave room)         Wheelchair     Assist Is the patient using a wheelchair?: No             Wheelchair 50 feet with 2 turns activity    Assist            Wheelchair 150 feet activity     Assist          Blood pressure 126/77, pulse (!) 109, temperature 97.9 F (36.6 C), temperature source Oral, resp. rate 16, height 5\' 1"  (1.549 m), weight 71.5 kg, SpO2 100%.  Medical Problem List and Plan: 1. Functional deficits secondary to status epilepticus with delirium and left thigh rhabdomyolysis and pain              -patient may shower             -ELOS/Goals: 10-12 days, Mod I to supervision PT/OT/SLP             -Admit to CIR   Con't CIR PT, OT and SLP-IPOC due 2.  Antithrombotics: -DVT/anticoagulation:  Pharmaceutical: Lovenox             -antiplatelet therapy: N/A 3. Pain Management: Tylenol prn.  4. Mood/Behavior/Sleep: LCSW to follow for evaluation and support.             -antipsychotic agents: Seroquel-->ativan and haldol d/c 5. Neuropsych/cognition: This patient is not fully capable of making decisions on his own behalf. 6. Skin/Wound Care: Routine pressure relief measures.  7. Fluids/Electrolytes/Nutrition: Monitor I/O. Check CMET in am. 8. Seizure d/o: On Keppra and Lamictal.             --status epilepticus due to THC/Covid and resolved. Followed by Dr. Malvin Johns [facility])  9/12- cont' seizure precautions.  9. Aspiration PNA/Covid PNA:   MRSA+ BAL-> Vanc-->Linezolid thru 09/12 for 7 day course antibiotic              --aspiration precautions due to dysphagia. Covid precautions thru 09/14  9/13- educated pt would rather her 62 yr old daughter come tomorrow when off precautions for covid.  10. Tachycardia: Likely due to deconditioning with HR 120-140. Monitor for symptoms.  11.  Left  thigh pain/swelling: Traumatic due to rhabdo. CK improving from 47,715-->2,6909 --AKI has resolved. Tylenol prn for pain.  9/12- will recheck CK in AM 9/13- CK down to 1994- con't to check weekly since doing so well 12. Delirium: Will continue Seroquel 50 mg am/100 mg HS  9/12- working well 13.  Dysphagia: DYS 2 with honey thick diet.  Continue SLP  9/13- up to D3 thin as of 9/12 afternoon 14.  Marijuana use.  History of cocaine use.  Advised cessation 15.  Anemia/thrombocytosis.  Hgb up to 10.8 on 9/9.  Platelets 407K.  Recheck CMP  9/12- Plts up to 711- if gets to 1 million, will add ASA 16. Leukocytosis-   9/12- up to 15.3 from 13.1 from 10 4 days ago- will check U/A and Cx- also finished Linezolid for aspiration PNA-    - will recheck labs in AM  9/13- labs still pending at 4:53 PM- will have labs reordered for AM since not done today 17. Transaminitis  9/12- LFTS's 154 and 317- up from 80/36 on 9/2- will reduce tylenol to 325 mg and recheck Monday and Thursday    I spent a total of 35   minutes on total care today- >50% coordination of care- due to  IPOC done- d/w pt about polysubstance abuse and daughter visiting- also d/w nursing same topics.   LOS: 2 days A FACE TO FACE EVALUATION WAS PERFORMED  Colleen Pearson 03/05/2023, 4:50 PM

## 2023-03-05 NOTE — IPOC Note (Signed)
Overall Plan of Care [facility]) Patient Details Name: Colleen Pearson MRN: [id number] DOB: [birth year]  Admitting Diagnosis: Seizures [facility])  Hospital Problems: Principal Problem:   Seizures (HCC) Active Problems:   Rhabdomyolysis   Debility   Delirium   Dysphagia   Polysubstance abuse (HCC)   Left thigh pain   Tachycardia     Functional Problem List: Nursing Behavior, Safety, Bladder, Endurance, Medication Management, Motor  PT Balance, Pain, Safety, Sensory, Endurance  OT Balance, Cognition, Endurance, Pain, Safety  SLP Cognition, Linguistic, Nutrition, Safety, Endurance  TR         Basic ADL's: OT Grooming, Bathing, Dressing, Toileting     Advanced  ADL's: OT       Transfers: PT Bed Mobility, Bed to Chair, Car, Furniture, Civil Service fast streamer, Research scientist (life sciences): PT Ambulation, Stairs, Psychologist, prison and probation services     Additional Impairments: OT    SLP Swallowing, Social Cognition   Problem Solving, Memory, Attention  TR      Anticipated Outcomes Item Anticipated Outcome  Self Feeding mod I  Swallowing  supervision   Basic self-care  mod I  Toileting  mod I   Bathroom Transfers mod I  Bowel/Bladder  regain continence of bowel/bladder  Transfers  supervision for safety awareness with LRAD  Locomotion  supervision for safety awareness with LRAD  Communication     Cognition  modI  Pain  less than 4  Safety/Judgment  no falls or seizures with injury   Therapy Plan: PT Intensity: Minimum of 1-2 x/day ,45 to 90 minutes PT Frequency: Total of 15 hours over 7 days of combined therapies PT Duration Estimated Length of Stay: 7-10 days, pending speech therapy and medical needs OT Intensity: Minimum of 1-2 x/day, 45 to 90 minutes OT Frequency: 5 out of 7 days OT Duration/Estimated Length of Stay: 7-10 days SLP Intensity: Minumum of 1-2 x/day, 30 to 90 minutes SLP Frequency: Total of 15 hours over 7 days of combined therapies SLP Duration/Estimated  Length of Stay: 7-10 days   Team Interventions: Nursing Interventions Patient/Family Education, Medication Management, Bladder Management, Cognitive Remediation/Compensation, Psychosocial Support, Disease Management/Prevention, Dysphagia/Aspiration Precaution Training, Discharge Planning  PT interventions Ambulation/gait training, Cognitive remediation/compensation, Discharge planning, DME/adaptive equipment instruction, Functional mobility training, Pain management, Psychosocial support, Splinting/orthotics, Therapeutic Activities, UE/LE Strength taining/ROM, Visual/perceptual remediation/compensation, Wheelchair propulsion/positioning, UE/LE Coordination activities, Therapeutic Exercise, Stair training, Skin care/wound management, Patient/family education, Functional electrical stimulation, Neuromuscular re-education, Disease management/prevention, Firefighter, Warden/ranger  OT Interventions Warden/ranger, Discharge planning, Pain management, Self Care/advanced ADL retraining, Therapeutic Activities, Cognitive remediation/compensation, Functional mobility training, Patient/family education, Therapeutic Exercise, Community reintegration, Fish farm manager, Neuromuscular re-education, UE/LE Strength taining/ROM  SLP Interventions Dysphagia/aspiration precaution training, Speech/Language facilitation, Cueing hierarchy, Environmental controls, Oral motor exercises, Functional tasks, Patient/family education, Cognitive remediation/compensation  TR Interventions    SW/CM Interventions Discharge Planning, Patient/Family Education, Psychosocial Support   Barriers to Discharge MD  Medical stability, Home enviroment access/loayout, Incontinence, Lack of/limited family support, Medication compliance, and Behavior  Nursing Decreased caregiver support, Home environment access/layout, Incontinence, Medication compliance, Behavior home with family to 2 level  wiht B/B on main level  PT Home environment access/layout, Decreased caregiver support    OT      SLP Lack of/limited family support, Inaccessible home environment    SW Decreased caregiver support, Lack of/limited family support, Community education officer for SNF coverage     Team Discharge Planning: Destination: PT-Home ,OT- Home , SLP-Home Projected Follow-up: PT-Outpatient PT, Home health PT, OT-  Outpatient OT, SLP-Outpatient SLP Projected Equipment Needs: PT-To be determined, Rolling walker with 5" wheels, OT- Tub/shower bench, To be determined, SLP-To be determined Equipment Details: PT-likely youth RW, OT-  Patient/family involved in discharge planning: PT- Patient,  OT-Patient, SLP-Patient  MD ELOS: 7-10 days Medical Rehab Prognosis:  Good Assessment: The patient has been admitted for CIR therapies with the diagnosis of seizures-status with encephalopathy and COIVD/asp pneumonia. The team will be addressing functional mobility, strength, stamina, balance, safety, adaptive techniques and equipment, self-care, bowel and bladder mgt, patient and caregiver education, dysphagia treatment. Goals have been set at mod I to supervision. Anticipated discharge destination is home with family.        See Team Conference Notes for weekly updates to the plan of care

## 2023-03-05 NOTE — Care Management (Signed)
[facility] Individual Statement of Services  Patient Name:  Colleen Pearson  Date:  03/05/2023  Welcome to the [facility].  Our goal is to provide you with an individualized program based on your diagnosis and situation, designed to meet your specific needs.  With this comprehensive rehabilitation program, you will be expected to participate in at least 3 hours of rehabilitation therapies Monday-Friday, with modified therapy programming on the weekends.  Your rehabilitation program will include the following services:  Physical Therapy (PT), Occupational Therapy (OT), Speech Therapy (ST), 24 hour per day rehabilitation nursing, Therapeutic Recreaction (TR), Psychology, Neuropsychology, Care Coordinator, Rehabilitation Medicine, Nutrition Services, Pharmacy Services, and Other  Weekly team conferences will be held on Tuesdays to discuss your progress.  Your Inpatient Rehabilitation Care Coordinator will talk with you frequently to get your input and to update you on team discussions.  Team conferences with you and your family in attendance may also be held.  Expected length of stay: 7-10 days  Overall anticipated outcome: Independent with Assistive Device  Depending on your progress and recovery, your program may change. Your Inpatient Rehabilitation Care Coordinator will coordinate services and will keep you informed of any changes. Your Inpatient Rehabilitation Care Coordinator's name and contact numbers are listed  below.  The following services may also be recommended but are not provided by the Inpatient [facility]:  Driving Evaluations Home Health Rehabiltiation Services Outpatient Rehabilitation Services Vocational Rehabilitation   Arrangements will be made to provide these services after discharge if needed.  Arrangements include referral to agencies that provide these services.  Your insurance has been verified to be:  [place]  [employer]icaid [place] [employer]  Your primary doctor is:  Theora Master  Pertinent information will be shared with your doctor and your insurance company.  Inpatient Rehabilitation Care Coordinator:  Susie Cassette [phone] or ([phone]  Information discussed with and copy given to patient by: Gretchen Short, 03/05/2023, 12:30 PM

## 2023-03-05 NOTE — Progress Notes (Signed)
Patient ID: Colleen Pearson, female   DOB: 1991/06/12, 32 y.o.   MRN: [id number]  SW spoke with pt mother to inform on ELOS, and will confirm d/c location. Pt mother asks about family being able to visit. SW shared will discuss with nursing and follow-up.   *SW received updates from charge RN reporting attending indicated pt family can visit her tomorrow.   1304- SW called pt mother Colleen Pearson to inform on above details.   Colleen Pearson, MSW, LCSWA Office: [phone] Cell: [phone] Fax: [phone]

## 2023-03-05 NOTE — Progress Notes (Signed)
Occupational Therapy Session Note  Patient Details  Name: TANAIJA JONAITIS MRN: [id number] Date of Birth: 1991-05-11  Today's Date: 03/05/2023 OT Individual Time: 0930-1030 OT Individual Time Calculation (min): 60 min    Short Term Goals: Week 1:  OT Short Term Goal 1 (Week 1): STG=LTG d/t ELOS  Skilled Therapeutic Interventions/Progress Updates:    OT intervention with focus on discharge planning, DME recommendations, bed mobility, sitting balance, BUE therex, and activity tolerance. Pt will need TTB upon discharge. Bed mobility with supervision. Sitting balance with supervision. Breakfast tray arrived. Self feeding with supervision. Pt using swallowing strategies independently. Pt issued yellow theraband and return demonstrated exercises for strengthening. Pt requested female OT to assist with bathing/dressing tasks and scheduler notified. Pt remained in bed with all needs within reach. Bed alarm activated.   Therapy Documentation Precautions:  Precautions Precautions: Fall, Other (comment) Precaution Comments: watch sats and HR Restrictions Weight Bearing Restrictions: No Pain: Pt reports LLE pain (unrated); repositioned  Therapy/Group: Individual Therapy  Rich Brave 03/05/2023, 10:34 AM

## 2023-03-05 NOTE — Progress Notes (Signed)
Physical Therapy Session Note  Patient Details  Name: Colleen Pearson MRN: [id number] Date of Birth: [birth year]  Today's Date: 03/05/2023 PT Individual Time: 4098-1191 PT Individual Time Calculation (min): 54 min   Short Term Goals: Week 1:  PT Short Term Goal 1 (Week 1): =LTGs d/t ELOS  Skilled Therapeutic Interventions/Progress Updates:    pt received in bed and agreeable to therapy. Pt reports 8/10 pain in her LLE anterior and posterior hip. Discussed this pain at length, including palpation, ROM, discussing using tylenol for pain management and staying ahead of pain (pt states she does want to use controlled substances d/t prior substance abuse). Unable to improve pain with manual therapy at this time, but pt did report pain down to 7/10 after ambulation.  Session focused on gait in room with and without RW. Without RW, pt requires HHA to offload LLE during gait. Mobility is somewhat limited by weakness, but more so by pain. Pt also performed Sit to stand without AD x 5 for strength and endurance, with cues for equal weightbearing BIL as able.   Pt then requested to use bathroom, ambulatory transfer to bathroom with CGA and RW, hand and oral hygiene with supervision. Pt returned to recliner and was left with all needs in reach and alarm active.   Therapy Documentation Precautions:  Precautions Precautions: Fall, Other (comment) Precaution Comments: watch sats and HR Restrictions Weight Bearing Restrictions: No General:       Therapy/Group: Individual Therapy  Juluis Rainier 03/05/2023, 12:11 PM

## 2023-03-05 NOTE — Progress Notes (Signed)
Speech Language Pathology Daily Session Note  Patient Details  Name: Colleen Pearson MRN: [id number] Date of Birth: 06-05-1991  Today's Date: 03/05/2023 SLP Individual Time: 1440-1525 SLP Individual Time Calculation (min): 45 min  Short Term Goals: Week 1: SLP Short Term Goal 1 (Week 1): STGs = LTGs d/t ELOS  Skilled Therapeutic Interventions:   Pt greeted at bedside. She was asleep upon SLP arrival, though woke easily to participate in tasks targeting cognition and dysphagia goals. She reported sore throat improved as compared to yesterday. Additionally, reduced hoarse vocal quality and coughing noted. Throughout tx tasks, she took single sips of thin liquids (water) and independently utilized chin tuck for all swallows. No overt s/s of aspiration noted across ~6 oz water. Additionally, she reported episode of coughing w/ thin liquids last night d/t taking a sip in the middle of the night and forgetting to use her chin tuck. She demonstrated adequate awareness and seemingly improved sensation, as she reported she "felt it go down the wrong way." She independently completed a written time management task, but benefited from s verbal cues and repetitions during verbal time management task d/t information processing deficits  She also completed a thought formulation task requiring reasoning and abstract thinking w/ s verbal cues. At the end of the tx session, she was left in her bed with the alarm set and call light within reach. Recommend cont ST per POC.   Pain  No pain reported.  Therapy/Group: Individual Therapy  Pati Gallo 03/05/2023, 3:09 PM

## 2023-03-06 DIAGNOSIS — R569 Unspecified convulsions: Secondary | ICD-10-CM | POA: Diagnosis not present

## 2023-03-06 LAB — URINE CULTURE: Culture: >=100000 — AB

## 2023-03-06 LAB — CBC WITH DIFFERENTIAL/PLATELET
Abs Immature Granulocytes: 0.08 10*3/uL — ABNORMAL HIGH (ref 0.00–0.07)
Basophils Absolute: 0 10*3/uL (ref 0.0–0.1)
Basophils Relative: 0 %
Eosinophils Absolute: 0.1 10*3/uL (ref 0.0–0.5)
Eosinophils Relative: 2 %
HCT: 30.6 % — ABNORMAL LOW (ref 36.0–46.0)
Hemoglobin: 10.5 g/dL — ABNORMAL LOW (ref 12.0–15.0)
Immature Granulocytes: 1 %
Lymphocytes Relative: 13 %
Lymphs Abs: 1 10*3/uL (ref 0.7–4.0)
MCH: 29.7 pg (ref 26.0–34.0)
MCHC: 34.3 g/dL (ref 30.0–36.0)
MCV: 86.4 fL (ref 80.0–100.0)
Monocytes Absolute: 0.6 10*3/uL (ref 0.1–1.0)
Monocytes Relative: 8 %
Neutro Abs: 5.4 10*3/uL (ref 1.7–7.7)
Neutrophils Relative %: 76 %
Platelets: 537 10*3/uL — ABNORMAL HIGH (ref 150–400)
RBC: 3.54 MIL/uL — ABNORMAL LOW (ref 3.87–5.11)
RDW: 12.7 % (ref 11.5–15.5)
WBC: 7.1 10*3/uL (ref 4.0–10.5)
nRBC: 0 % (ref 0.0–0.2)

## 2023-03-06 MED ORDER — QUETIAPINE FUMARATE 50 MG PO TABS
100.0000 mg | ORAL_TABLET | Freq: Every day | ORAL | Status: DC
  Administered 2023-03-06 – 2023-03-09 (×4): 100 mg via ORAL
  Filled 2023-03-06 (×4): qty 2

## 2023-03-06 MED ORDER — POLYETHYLENE GLYCOL 3350 17 G PO PACK
17.0000 g | PACK | Freq: Every day | ORAL | Status: DC
  Filled 2023-03-06 (×3): qty 1

## 2023-03-06 MED ORDER — LEVETIRACETAM 500 MG PO TABS
1000.0000 mg | ORAL_TABLET | Freq: Two times a day (BID) | ORAL | Status: DC
  Administered 2023-03-06 – 2023-03-10 (×8): 1000 mg via ORAL
  Filled 2023-03-06 (×8): qty 2

## 2023-03-06 MED ORDER — LAMOTRIGINE 100 MG PO TABS
150.0000 mg | ORAL_TABLET | Freq: Two times a day (BID) | ORAL | Status: DC
  Administered 2023-03-06 – 2023-03-10 (×8): 150 mg via ORAL
  Filled 2023-03-06 (×8): qty 2

## 2023-03-06 MED ORDER — LORAZEPAM 0.5 MG PO TABS
1.0000 mg | ORAL_TABLET | Freq: Once | ORAL | Status: AC
  Administered 2023-03-06: 1 mg via ORAL
  Filled 2023-03-06: qty 2

## 2023-03-06 MED ORDER — QUETIAPINE FUMARATE 50 MG PO TABS
50.0000 mg | ORAL_TABLET | Freq: Every day | ORAL | Status: DC
  Administered 2023-03-07 – 2023-03-10 (×4): 50 mg via ORAL
  Filled 2023-03-06 (×5): qty 1

## 2023-03-06 NOTE — Progress Notes (Signed)
Occupational Therapy Session Note  Patient Details  Name: Colleen Pearson MRN: [id number] Date of Birth: 10-27-1990  Today's Date: 03/06/2023 OT Individual Time: 4098-1191 OT Individual Time Calculation (min): 50 min    Short Term Goals: Week 1:  OT Short Term Goal 1 (Week 1): STG=LTG d/t ELOS  Skilled Therapeutic Interventions/Progress Updates: Patient concurred to participate in OT session. Patient stated she was ready to go home, but that clinical staff 'are checking to make sure she swallows safely.'Patient  completed recliner to shower transfer via RW and tub transfer bench in room shower with close S.     Bathing in shower  and dressing sit to stand sitting edge of tub transfer bench with close S without losses of balance, complaints of dizziness or other challenges.  Additionally patient problem solved with clinician on how to reach emergency dept and/or Lost in Found to try to recover her clothing and shoes.      Patient will continue to benefit from continued OT to address goals, safety, and indepence with self care,  ADLs and IADLs.  At the end of thesession, patient sat up in her bed with call bell within reach and bed alarm engaged.  Continue POC     Therapy Documentation Precautions:  Precautions Precautions: Fall, Other (comment) Precaution Comments: watch sats and HR Restrictions Weight Bearing Restrictions: No General:    Pain: denied     Therapy/Group: Individual Therapy  Bud Face [facility] 03/06/2023, 4:19 PM

## 2023-03-06 NOTE — Progress Notes (Signed)
PROGRESS NOTE   Subjective/Complaints: Progressing well with therapy Has no new complaints today Continues to have left hip pain CBC reviewed and is stable  ROS:   Pt denies SOB, abd pain, CP, N/V/C/D, and vision changes  Except for HPI  Objective:   No results found. Recent Labs    03/05/23 1622 03/06/23 0805  WBC 7.9 7.1  HGB 10.7* 10.5*  HCT 31.0* 30.6*  PLT 621* 537*   Recent Labs    03/04/23 0758  NA 137  K 4.0  CL 101  CO2 20*  GLUCOSE 92  BUN 16  CREATININE 0.88  CALCIUM 8.7*    Intake/Output Summary (Last 24 hours) at 03/06/2023 1422 Last data filed at 03/05/2023 1826 Gross per 24 hour  Intake 472 ml  Output --  Net 472 ml        Physical Exam: Vital Signs Blood pressure 124/78, pulse 85, temperature 98.2 F (36.8 C), temperature source Oral, resp. rate 18, height 5\' 1"  (1.549 m), weight 72 kg, SpO2 100%.  Gen: no distress, normal appearing HEENT: oral mucosa pink and moist, NCAT Cardio: Reg rate Chest: normal effort, normal rate of breathing Abd: soft, non-distended Ext: no edema Psych: pleasant, normal affect Neuro:  Alert and oriented x4, follows commands, CN 2-12 intact, no dysarthria, soft voice, fair memory RUE: 5/5 Deltoid, 5/5 Biceps, 5/5 Triceps, 5/5 Wrist Ext, 5/5 Grip LUE: 5/5 Deltoid, 5/5 Biceps, 5/5 Triceps, 5/5 Wrist Ext, 5/5 Grip RLE: HF 5/5, KE 5/5,  ADF 5/5, APF 5/5 LLE: HF 2/5, KE 4-/5, ADF 5/5, APF 5/5 Sensory exam normal for light touch and pain in all 4 limbs. No limb ataxia or cerebellar signs. No abnormal tone appreciated.    Musculoskeletal: mild C spine paraspinal tenderness, L thigh with tenderness and pain with PROM, mild R wrist tenderness RUE IV- looks ok  Assessment/Plan: 1. Functional deficits which require 3+ hours per day of interdisciplinary therapy in a comprehensive inpatient rehab setting. Physiatrist is providing close team supervision and 24  hour management of active medical problems listed below. Physiatrist and rehab team continue to assess barriers to discharge/monitor patient progress toward functional and medical goals  Care Tool:  Bathing    Body parts bathed by patient: Right arm, Right lower leg, Left lower leg, Face, Left arm, Chest, Front perineal area, Abdomen, Buttocks, Right upper leg, Left upper leg         Bathing assist Assist Level: Supervision/Verbal cueing     Upper Body Dressing/Undressing Upper body dressing   What is the patient wearing?: Pull over shirt    Upper body assist Assist Level: Supervision/Verbal cueing    Lower Body Dressing/Undressing Lower body dressing      What is the patient wearing?: Underwear/pull up, Pants     Lower body assist Assist for lower body dressing: Supervision/Verbal cueing     Toileting Toileting    Toileting assist Assist for toileting: Supervision/Verbal cueing     Transfers Chair/bed transfer  Transfers assist     Chair/bed transfer assist level: Supervision/Verbal cueing     Locomotion Ambulation   Ambulation assist      Assist level: Supervision/Verbal cueing Assistive device: Walker-rolling  Max distance: 150   Walk 10 feet activity   Assist     Assist level: Supervision/Verbal cueing Assistive device: Walker-rolling   Walk 50 feet activity   Assist    Assist level: Supervision/Verbal cueing Assistive device: Walker-rolling    Walk 150 feet activity   Assist Walk 150 feet activity did not occur: Safety/medical concerns (covid restrictions, could not leave room)  Assist level: Supervision/Verbal cueing Assistive device: Walker-rolling    Walk 10 feet on uneven surface  activity   Assist Walk 10 feet on uneven surfaces activity did not occur: Safety/medical concerns (covid restrictions, could not leave room)         Wheelchair     Assist Is the patient using a wheelchair?: No              Wheelchair 50 feet with 2 turns activity    Assist            Wheelchair 150 feet activity     Assist          Blood pressure 124/78, pulse 85, temperature 98.2 F (36.8 C), temperature source Oral, resp. rate 18, height 5\' 1"  (1.549 m), weight 72 kg, SpO2 100%.  Medical Problem List and Plan: 1. Functional deficits secondary to status epilepticus with delirium and left thigh rhabdomyolysis and pain              -patient may shower             -ELOS/Goals: 10-12 days, Mod I to supervision PT/OT/SLP             -Admit to CIR   Con't CIR PT, OT and SLP-IPOC due 2.  Antithrombotics: -DVT/anticoagulation:  Pharmaceutical: Lovenox             -antiplatelet therapy: N/A 3. Pain Management: Tylenol prn.  4. Mood/Behavior/Sleep: LCSW to follow for evaluation and support.             -antipsychotic agents: Seroquel-->ativan and haldol d/c 5. Neuropsych/cognition: This patient is not fully capable of making decisions on his own behalf. 6. Skin/Wound Care: Routine pressure relief measures.  7. Fluids/Electrolytes/Nutrition: Monitor I/O. Check CMET in am. 8. Seizure d/o: On Keppra and Lamictal.             --status epilepticus due to THC/Covid and resolved. Followed by Dr. Malvin Johns [facility])  9/12- cont' seizure precautions.  9. Aspiration PNA/Covid PNA:   MRSA+ BAL-> Vanc-->Linezolid thru 09/12 for 7 day course antibiotic              --aspiration precautions due to dysphagia. Covid precautions thru 09/14  9/13- educated pt would rather her 32 yr old daughter come tomorrow when off precautions for covid.  10. Tachycardia: Likely due to deconditioning with HR 120-140. Monitor for symptoms.  11.  Left thigh pain/swelling: Traumatic due to rhabdo. CK improving from 47,715-->2,6909 --AKI has resolved. Tylenol prn for pain.  9/12- will recheck CK in AM 9/13- CK down to 1994- con't to check weekly since doing so well 12. Delirium: Will continue Seroquel 50 mg am/100 mg HS  9/12-  working well 13.  Dysphagia: DYS 2 with honey thick diet.  Continue SLP  Continue D3 thin as of 9/12 afternoon  14.  Marijuana use.  History of cocaine use.  Advised cessation  15.  Anemia/thrombocytosis.  Hgb up to 10.8 on 9/9.    Platelets reviewed and trending down  16. Leukocytosis-   9/12- up to 15.3 from 13.1 from  10 4 days ago- will check U/A and Cx- also finished Linezolid for aspiration PNA-    WBC reviewed and is stable. UA reviewed and is negative.   17. Transaminitis  9/12- LFTS's 154 and 317- up from 80/36 on 9/2- will reduce tylenol to 325 mg and recheck Monday and Thursday     LOS: 3 days A FACE TO FACE EVALUATION WAS PERFORMED  Clint Bolder P Kendal Raffo 03/06/2023, 2:22 PM

## 2023-03-06 NOTE — Progress Notes (Deleted)
Occupational Therapy Session Note  Patient Details  Name: Colleen Pearson MRN: [id number] Date of Birth: 07-Jun-1991  Today's Date: 03/06/2023 OT Individual Time: 0865-7846 OT Individual Time Calculation (min): 50 min    Short Term Goals: Week 1:  OT Short Term Goal 1 (Week 1): STG=LTG d/t ELOS  Skilled Therapeutic Interventions/Progress Updates: patient complained of fatigue, tremors, and low back pain upon approach for OT and also stated, "I will work for a while, but I did not want to do a lot of walking.   I just finished doing that."   Pateitn was already bathed and dressed and stated she would do a little seated endurance activities and then get onto her bed to rest.    Though patient presented with tremors in left hand, she completed bilateral endurance actitivities seated and AAROM and stretches with bilateral UE in support of functional use and activities.  As well she participated in deep breathing activities to help work through the low back pain.   She stated that she did not want to take medication for the pain.  At the end of the session, she was able to complete a stand pivot ptransfer to her left from the w/c to her bed.    She was able to complete bed mobility indepdently and with extra time.    Her call bell was placed within reach and her bed alarm was engaged at the end of the session.  Her husband came into the room at the end of the session.      Continue OT POC   Therapy Documentation Precautions:  Precautions Precautions: Fall, Other (comment) Precaution Comments: watch sats and HR Restrictions Weight Bearing Restrictions: No General:    Pain: "in my low back constantly" did not describe number on pain scale.   Facial expression was somewhat flaccid; however, she stated stated her pain was great and asked this clincian to rub some of her pain oil on low back in response to pain.    Therapy/Group: Individual Therapy  Bud Face [facility] 03/06/2023, 4:05  PM

## 2023-03-06 NOTE — Progress Notes (Signed)
Physical Therapy Session Note  Patient Details  Name: Colleen Pearson MRN: [id number] Date of Birth: October 19, 1990  Today's Date: 03/06/2023 PT Individual Time: 0800-0900 PT Individual Time Calculation (min): 60 min   Short Term Goals: Week 1:  PT Short Term Goal 1 (Week 1): =LTGs d/t ELOS  Skilled Therapeutic Interventions/Progress Updates:  Pt was seen bedside in the am. Pt performed bed mobility with S. Pt performed sit to stand and stand pivot transfers with S. Pt ambulated 150 feet x 3 with rolling walker and S. Pt ambulated 150 and 180 feet without assistive device and contact guard to S and verbal cues. Pt performed cone taps and alternating cone taps 3 sets x 10 reps each. Pt left sitting up in chair at end of treatment with chair alarm on and call bell within reach.   Therapy Documentation Precautions:  Precautions Precautions: Fall, Other (comment) Precaution Comments: watch sats and HR Restrictions Weight Bearing Restrictions: No General:   Pain: Pt c/o 7/10 pain in L hip.    Therapy/Group: Individual Therapy  Rayford Halsted 03/06/2023, 12:27 PM

## 2023-03-07 DIAGNOSIS — R569 Unspecified convulsions: Secondary | ICD-10-CM | POA: Diagnosis not present

## 2023-03-07 NOTE — Progress Notes (Signed)
Pt had family members visit at hs. After leaving pt became very anxious and was crying. All scheduled meds were given prior to episode. On call provided notified and one time dose of antianxiety medication was ordered. Pt later one stated "I feel much better after taking that medication". No further concerns at this time.

## 2023-03-07 NOTE — Plan of Care (Signed)
  Problem: RH SKIN INTEGRITY Goal: RH STG SKIN FREE OF INFECTION/BREAKDOWN Description: Skin will remain intact and be free of infection/breakdown with min assist  Outcome: Progressing   Problem: RH SAFETY Goal: RH STG ADHERE TO SAFETY PRECAUTIONS W/ASSISTANCE/DEVICE Description: STG Adhere to Safety Precautions With min Assistance/Device. Outcome: Progressing Goal: RH STG DECREASED RISK OF FALL WITH ASSISTANCE Description: STG Decreased Risk of Fall With min Assistance. Outcome: Progressing

## 2023-03-07 NOTE — Progress Notes (Signed)
PROGRESS NOTE   Subjective/Complaints: Needs to use bathroom, alerted nursing staff Patient anxious after family members left, patient felt better after taking anxiety medication  ROS:   Pt denies SOB, abd pain, CP, N/V/C/D, and vision changes, +anxiety  Except for HPI  Objective:   No results found. Recent Labs    03/05/23 1622 03/06/23 0805  WBC 7.9 7.1  HGB 10.7* 10.5*  HCT 31.0* 30.6*  PLT 621* 537*   No results for input(s): "NA", "K", "CL", "CO2", "GLUCOSE", "BUN", "CREATININE", "CALCIUM" in the last 72 hours.   Intake/Output Summary (Last 24 hours) at 03/07/2023 1157 Last data filed at 03/07/2023 0745 Gross per 24 hour  Intake 474 ml  Output --  Net 474 ml        Physical Exam: Vital Signs Blood pressure 131/78, pulse 85, temperature 98.2 F (36.8 C), temperature source Oral, resp. rate 17, height 5\' 1"  (1.549 m), weight 59.1 kg, SpO2 100%.  Gen: no distress, normal appearing HEENT: oral mucosa pink and moist, NCAT Cardio: Reg rate Chest: normal effort, normal rate of breathing Abd: soft, non-distended Ext: no edema Psych: pleasant, normal affect, anxious Neuro:  Alert and oriented x4, follows commands, CN 2-12 intact, no dysarthria, soft voice, fair memory RUE: 5/5 Deltoid, 5/5 Biceps, 5/5 Triceps, 5/5 Wrist Ext, 5/5 Grip LUE: 5/5 Deltoid, 5/5 Biceps, 5/5 Triceps, 5/5 Wrist Ext, 5/5 Grip RLE: HF 5/5, KE 5/5,  ADF 5/5, APF 5/5 LLE: HF 2/5, KE 4-/5, ADF 5/5, APF 5/5 Sensory exam normal for light touch and pain in all 4 limbs. No limb ataxia or cerebellar signs. No abnormal tone appreciated.    Musculoskeletal: mild C spine paraspinal tenderness, L thigh with tenderness and pain with PROM, mild R wrist tenderness RUE IV- looks ok  Assessment/Plan: 1. Functional deficits which require 3+ hours per day of interdisciplinary therapy in a comprehensive inpatient rehab setting. Physiatrist is  providing close team supervision and 24 hour management of active medical problems listed below. Physiatrist and rehab team continue to assess barriers to discharge/monitor patient progress toward functional and medical goals  Care Tool:  Bathing    Body parts bathed by patient: Right arm, Right lower leg, Left lower leg, Face, Left arm, Chest, Front perineal area, Abdomen, Buttocks, Right upper leg, Left upper leg         Bathing assist Assist Level: Supervision/Verbal cueing     Upper Body Dressing/Undressing Upper body dressing   What is the patient wearing?: Pull over shirt    Upper body assist Assist Level: Supervision/Verbal cueing    Lower Body Dressing/Undressing Lower body dressing      What is the patient wearing?: Underwear/pull up, Pants     Lower body assist Assist for lower body dressing: Supervision/Verbal cueing     Toileting Toileting    Toileting assist Assist for toileting: Supervision/Verbal cueing     Transfers Chair/bed transfer  Transfers assist     Chair/bed transfer assist level: Supervision/Verbal cueing     Locomotion Ambulation   Ambulation assist      Assist level: Supervision/Verbal cueing Assistive device: Walker-rolling Max distance: 150   Walk 10 feet activity   Assist  Assist level: Supervision/Verbal cueing Assistive device: Walker-rolling   Walk 50 feet activity   Assist    Assist level: Supervision/Verbal cueing Assistive device: Walker-rolling    Walk 150 feet activity   Assist Walk 150 feet activity did not occur: Safety/medical concerns (covid restrictions, could not leave room)  Assist level: Supervision/Verbal cueing Assistive device: Walker-rolling    Walk 10 feet on uneven surface  activity   Assist Walk 10 feet on uneven surfaces activity did not occur: Safety/medical concerns (covid restrictions, could not leave room)         Wheelchair     Assist Is the patient using a  wheelchair?: No             Wheelchair 50 feet with 2 turns activity    Assist            Wheelchair 150 feet activity     Assist          Blood pressure 131/78, pulse 85, temperature 98.2 F (36.8 C), temperature source Oral, resp. rate 17, height 5\' 1"  (1.549 m), weight 59.1 kg, SpO2 100%.  Medical Problem List and Plan: 1. Functional deficits secondary to status epilepticus with delirium and left thigh rhabdomyolysis and pain              -patient may shower             -ELOS/Goals: 10-12 days, Mod I to supervision PT/OT/SLP  Continue CIR PT, OT and SLP-IPOC due 2.  Antithrombotics: -DVT/anticoagulation:  Pharmaceutical: Lovenox             -antiplatelet therapy: N/A 3. Pain Management: Tylenol prn.  4. Anxiety: would benefit from seeing neuropsych.             -antipsychotic agents: Seroquel-->ativan and haldol d/c 5. Neuropsych/cognition: This patient is not fully capable of making decisions on his own behalf. 6. Skin/Wound Care: Routine pressure relief measures.  7. Fluids/Electrolytes/Nutrition: Monitor I/O. Check CMET in am. 8. Seizure d/o: Continue Keppra and Lamictal.             --status epilepticus due to THC/Covid and resolved. Followed by Dr. Malvin Johns [facility])  9/12- cont' seizure precautions.  9. Aspiration PNA/Covid PNA:   MRSA+ BAL-> Vanc-->Linezolid thru 09/12 for 7 day course antibiotic              --aspiration precautions due to dysphagia. Covid precautions thru 09/14  9/13- educated pt would rather her 30 yr old daughter come tomorrow when off precautions for covid.  10. Tachycardia: Likely due to deconditioning with HR 120-140. Monitor for symptoms.  11.  Left thigh pain/swelling: Traumatic due to rhabdo. CK improving from 47,715-->2,6909 --AKI has resolved. Tylenol prn for pain.  9/12- will recheck CK in AM 9/13- CK down to 1994- con't to check weekly since doing so well 12. Delirium: Will continue Seroquel 50 mg am/100 mg HS  9/12-  working well 13.  Dysphagia: DYS 2 with honey thick diet.  Continue SLP  Continue D3 thin as of 9/12 afternoon  14.  Marijuana use.  History of cocaine use.  Advised cessation  15.  Anemia/thrombocytosis.  Hgb up to 10.8 on 9/9.    Platelets reviewed and trending down  16. Leukocytosis-   9/12- up to 15.3 from 13.1 from 10 4 days ago- will check U/A and Cx- also finished Linezolid for aspiration PNA-    WBC reviewed and is stable. UA reviewed and is negative.   17. Transaminitis  9/12-  LFTS's 154 and 317- up from 80/36 on 9/2- will reduce tylenol to 325 mg and recheck Monday and Thursday     LOS: 4 days A FACE TO FACE EVALUATION WAS PERFORMED  Drema Pry Yong Wahlquist 03/07/2023, 11:57 AM

## 2023-03-08 DIAGNOSIS — R569 Unspecified convulsions: Secondary | ICD-10-CM | POA: Diagnosis not present

## 2023-03-08 LAB — COMPREHENSIVE METABOLIC PANEL
ALT: 134 U/L — ABNORMAL HIGH (ref 0–44)
AST: 54 U/L — ABNORMAL HIGH (ref 15–41)
Albumin: 3.1 g/dL — ABNORMAL LOW (ref 3.5–5.0)
Alkaline Phosphatase: 68 U/L (ref 38–126)
Anion gap: 6 (ref 5–15)
BUN: 10 mg/dL (ref 6–20)
CO2: 27 mmol/L (ref 22–32)
Calcium: 8.6 mg/dL — ABNORMAL LOW (ref 8.9–10.3)
Chloride: 105 mmol/L (ref 98–111)
Creatinine, Ser: 0.92 mg/dL (ref 0.44–1.00)
GFR, Estimated: >60 mL/min (ref >60)
Glucose, Bld: 107 mg/dL — ABNORMAL HIGH (ref 70–99)
Potassium: 4 mmol/L (ref 3.5–5.1)
Sodium: 138 mmol/L (ref 135–145)
Total Bilirubin: 0.3 mg/dL (ref 0.3–1.2)
Total Protein: 6.4 g/dL — ABNORMAL LOW (ref 6.5–8.1)

## 2023-03-08 LAB — CBC
HCT: 31.4 % — ABNORMAL LOW (ref 36.0–46.0)
Hemoglobin: 10.8 g/dL — ABNORMAL LOW (ref 12.0–15.0)
MCH: 30 pg (ref 26.0–34.0)
MCHC: 34.4 g/dL (ref 30.0–36.0)
MCV: 87.2 fL (ref 80.0–100.0)
Platelets: 498 10*3/uL — ABNORMAL HIGH (ref 150–400)
RBC: 3.6 MIL/uL — ABNORMAL LOW (ref 3.87–5.11)
RDW: 13.1 % (ref 11.5–15.5)
WBC: 6.1 10*3/uL (ref 4.0–10.5)
nRBC: 0 % (ref 0.0–0.2)

## 2023-03-08 NOTE — Progress Notes (Addendum)
Physical Therapy Session Note  Patient Details  Name: Colleen Pearson MRN: [id number] Date of Birth: 04/19/1991  Today's Date: 03/08/2023 PT Individual Time: 0800-0915 PT Individual Time Calculation (min): 75 min   Short Term Goals: Week 1:  PT Short Term Goal 1 (Week 1): =LTGs d/t ELOS  Skilled Therapeutic Interventions/Progress Updates:    pt received in bed and agreeable to therapy. No complaint of pain. Discussed d/c plan, pt is tearful about wanting to go home and be with her daughter. Initiated discussion with team via secure chat with current plan for Wednesday d/c.   Pt ambulated throughout session without AD, supervision and nearing mod I level. Pt navigated 6" steps with alternating pattern while ascending and step to while descending with single hand rail, supervision level. Car transfer with supervision. Ramp, mulch and curb with supervision. Discussed having supervision for stairs at home, which shouldn't be an issue because there will always be some one with her when leaving the house.  Pt performed 3 x 12 Sit to stand + OH press with 10lb dumbell for strength and endurance for supervision.  Pt also performed floor transfer x 10 with supervision. Education provided on calling EMS if needed.   Pt then ambulated down to [facility] entrance, [facility] entrance and back, then back to unit with 1 seated rest break. Included long inclines, steps, and unlevel surfaces with no signs of LOB or unsafe behavior. Supervision nearing mod I level.   Pt returned to room and used bathroom with distant supervision and returned to bed. was left with all needs in reach and alarm active.   Therapy Documentation Precautions:  Precautions Precautions: Fall, Other (comment) Precaution Comments: watch sats and HR Restrictions Weight Bearing Restrictions: No General:       Therapy/Group: Individual Therapy  Juluis Rainier 03/08/2023, 8:32 AM

## 2023-03-08 NOTE — Progress Notes (Signed)
PROGRESS NOTE   Subjective/Complaints:  Pt reports feeling well- denies ilness- LBM yesterday- feels great- better than before she came in hospital.    ROS:   Pt denies SOB, abd pain, CP, N/V/C/D, and vision changes   Except for HPI  Objective:   No results found. Recent Labs    03/06/23 0805 03/08/23 0641  WBC 7.1 6.1  HGB 10.5* 10.8*  HCT 30.6* 31.4*  PLT 537* 498*   Recent Labs    03/08/23 0641  NA 138  K 4.0  CL 105  CO2 27  GLUCOSE 107*  BUN 10  CREATININE 0.92  CALCIUM 8.6*     Intake/Output Summary (Last 24 hours) at 03/08/2023 1917 Last data filed at 03/08/2023 1820 Gross per 24 hour  Intake 793 ml  Output --  Net 793 ml        Physical Exam: Vital Signs Blood pressure 123/64, pulse (!) 105, temperature 98.2 F (36.8 C), temperature source Oral, resp. rate 18, height 5\' 1"  (1.549 m), weight 59.1 kg, SpO2 100%.    General: awake, alert, appropriate, sitting up in bed; NAD HENT: conjugate gaze; oropharynx moist CV: regular rhythm, tachycardic rate; no JVD Pulmonary: CTA B/L; no W/R/R- good air movement GI: soft, NT, ND, (+)BS Psychiatric: appropriate Neurological: Ox3- more appropriate- still mild STM deficits Neuro:  Alert and oriented x4, follows commands, CN 2-12 intact, no dysarthria, soft voice, fair memory RUE: 5/5 Deltoid, 5/5 Biceps, 5/5 Triceps, 5/5 Wrist Ext, 5/5 Grip LUE: 5/5 Deltoid, 5/5 Biceps, 5/5 Triceps, 5/5 Wrist Ext, 5/5 Grip RLE: HF 5/5, KE 5/5,  ADF 5/5, APF 5/5 LLE: HF 2/5, KE 4-/5, ADF 5/5, APF 5/5 Sensory exam normal for light touch and pain in all 4 limbs. No limb ataxia or cerebellar signs. No abnormal tone appreciated.    Musculoskeletal: mild C spine paraspinal tenderness, L thigh with tenderness and pain with PROM, mild R wrist tenderness RUE IV- looks ok  Assessment/Plan: 1. Functional deficits which require 3+ hours per day of interdisciplinary  therapy in a comprehensive inpatient rehab setting. Physiatrist is providing close team supervision and 24 hour management of active medical problems listed below. Physiatrist and rehab team continue to assess barriers to discharge/monitor patient progress toward functional and medical goals  Care Tool:  Bathing    Body parts bathed by patient: Right arm, Right lower leg, Left lower leg, Face, Left arm, Chest, Front perineal area, Abdomen, Buttocks, Right upper leg, Left upper leg         Bathing assist Assist Level: Supervision/Verbal cueing     Upper Body Dressing/Undressing Upper body dressing   What is the patient wearing?: Pull over shirt    Upper body assist Assist Level: Supervision/Verbal cueing    Lower Body Dressing/Undressing Lower body dressing      What is the patient wearing?: Underwear/pull up, Pants     Lower body assist Assist for lower body dressing: Supervision/Verbal cueing     Toileting Toileting    Toileting assist Assist for toileting: Supervision/Verbal cueing     Transfers Chair/bed transfer  Transfers assist     Chair/bed transfer assist level: Supervision/Verbal cueing     Locomotion  Ambulation   Ambulation assist      Assist level: Supervision/Verbal cueing Assistive device: Walker-rolling Max distance: 150   Walk 10 feet activity   Assist     Assist level: Supervision/Verbal cueing Assistive device: Walker-rolling   Walk 50 feet activity   Assist    Assist level: Supervision/Verbal cueing Assistive device: Walker-rolling    Walk 150 feet activity   Assist Walk 150 feet activity did not occur: Safety/medical concerns (covid restrictions, could not leave room)  Assist level: Supervision/Verbal cueing Assistive device: Walker-rolling    Walk 10 feet on uneven surface  activity   Assist Walk 10 feet on uneven surfaces activity did not occur: Safety/medical concerns (covid restrictions, could not leave  room)         Wheelchair     Assist Is the patient using a wheelchair?: No             Wheelchair 50 feet with 2 turns activity    Assist            Wheelchair 150 feet activity     Assist          Blood pressure 123/64, pulse (!) 105, temperature 98.2 F (36.8 C), temperature source Oral, resp. rate 18, height 5\' 1"  (1.549 m), weight 59.1 kg, SpO2 100%.  Medical Problem List and Plan: 1. Functional deficits secondary to status epilepticus with delirium and left thigh rhabdomyolysis and pain              -patient may shower             -ELOS/Goals: 10-12 days, Mod I to supervision PT/OT/SLP  Con't CIR PT , OT and SLP 2.  Antithrombotics: -DVT/anticoagulation:  Pharmaceutical: Lovenox             -antiplatelet therapy: N/A 3. Pain Management: Tylenol prn.  4. Anxiety: would benefit from seeing neuropsych.             -antipsychotic agents: Seroquel-->ativan and haldol d/c 5. Neuropsych/cognition: This patient is not fully capable of making decisions on his own behalf. 6. Skin/Wound Care: Routine pressure relief measures.  7. Fluids/Electrolytes/Nutrition: Monitor I/O. Check CMET in am. 8. Seizure d/o: Continue Keppra and Lamictal.             --status epilepticus due to THC/Covid and resolved. Followed by Dr. Malvin Johns [facility])  9/12- cont' seizure precautions.  9. Aspiration PNA/Covid PNA:   MRSA+ BAL-> Vanc-->Linezolid thru 09/12 for 7 day course antibiotic              --aspiration precautions due to dysphagia. Covid precautions thru 09/14  9/13- educated pt would rather her 32 yr old daughter come tomorrow when off precautions for covid.   9/16- off covid precautions 10. Tachycardia: Likely due to deconditioning with HR 120-140. Monitor for symptoms.  11.  Left thigh pain/swelling: Traumatic due to rhabdo. CK improving from 47,715-->2,6909 --AKI has resolved. Tylenol prn for pain.  9/12- will recheck CK in AM 9/13- CK down to 1994- con't to check  weekly since doing so well 9/16- CK down to 1625 12. Delirium: Will continue Seroquel 50 mg am/100 mg HS  9/12- working well 13.  Dysphagia: DYS 2 with honey thick diet.  Continue SLP  Continue D3 thin as of 9/12 afternoon  9/16- now on regular diet as of 9/16 14.  Marijuana use.  History of cocaine use.  Advised cessation  15.  Anemia/thrombocytosis.  Hgb up to 10.8 on 9/9.  Platelets reviewed and trending down  9/16- Plts down to 498 16. Leukocytosis-   9/12- up to 15.3 from 13.1 from 10 4 days ago- will check U/A and Cx- also finished Linezolid for aspiration PNA-    WBC reviewed and is stable. UA reviewed and is negative.   9/16- WBC 6.1 17. Transaminitis  9/12- LFTS's 154 and 317- up from 80/36 on 9/2- will reduce tylenol to 325 mg and recheck Monday and Thursday  9/16- Down to 54 and 134 (154 and 317)- looking much better   I spent a total of  38  minutes on total care today- >50% coordination of care- due to  D/w pt and staff about d/c date- will move to Wednesday- also reviewed all labs and improvement-   LOS: 5 days A FACE TO FACE EVALUATION WAS PERFORMED  Talal Fritchman 03/08/2023, 7:17 PM

## 2023-03-08 NOTE — Progress Notes (Signed)
Occupational Therapy Session Note  Patient Details  Name: Colleen Pearson MRN: [id number] Date of Birth: 11/01/1990  Today's Date: 03/08/2023 OT Individual Time: 1000-1100 OT Individual Time Calculation (min): 60 min    Short Term Goals: Week 1:  OT Short Term Goal 1 (Week 1): STG=LTG d/t ELOS  Skilled Therapeutic Interventions/Progress Updates:      Therapy Documentation Precautions:  Precautions Precautions: Fall, Other (comment) Precaution Comments: watch sats and HR Restrictions Weight Bearing Restrictions: No General: "I am excited to go home." Pt seated in bed upon OT arrival, agreeable to OT session.  Pain: no pain reported  ADL: Pt completing ambulating community distances throughout hospital with no AD at Mod I level with no LOB/SOB. Pt navigated concrete ramp outside Mod I. Pt made Mod I in room in order for mobility.   Other Treatments: OT discussed moving up pt D/C with care team. Pt D/C Wednesday 9/18. Pt and OT discussed looking into service dog in order to assist pt with seizures. OT gave pt resources for service dog in order for seizure recovery. Pt educated on emergency bracelets in order to alert people of seizure diagnosis in order for safety when integrated into the community.    Pt seated in bed with bed alarm activated, 2 bed rails up, call light within reach and 4Ps assessed.   Therapy/Group: Individual Therapy  Velia Meyer, OTD, OTR/L 03/08/2023, 4:12 PM

## 2023-03-08 NOTE — Progress Notes (Signed)
Speech Language Pathology Daily Session Note  Patient Details  Name: Colleen Pearson MRN: [id number] Date of Birth: 02-Feb-1991  Today's Date: 03/08/2023 SLP Individual Time: 7829-5621 SLP Individual Time Calculation (min): 60 min  Short Term Goals: Week 1: SLP Short Term Goal 1 (Week 1): STGs = LTGs d/t ELOS  Skilled Therapeutic Interventions: SLP conducted skilled therapy session targeting dysphagia management and cognitive retraining goals. SLP greeted patient sitting upright in bed. Notably, patient often up and down out of the bed throughout session to clean her space, seemingly difficult time sitting still. At one point in the session, SLP indicated she would be leaving to pick something up from the printer, and patient indicated she would use the couple of minutes to go to the bathroom. SLP and patient reviewed safety plan recommendations, stating patient is supervision for ambulation and would require someone in the room any time she was out of bed. Patient verbalized understanding. Patient went to commode where she was continent of bladder, then returned to bed prior to SLP trip to the printer. Patient tolerating Dys3 diet but requested SLP upgrade her diet so she could order a caesar salad. SLP assessed patient with regular solids noting no overt s/sx of penetration/aspiration across trials. Notably, during liquid boluses, patient exhibited reduced focus and benefited from min verbal cues to recall chin tuck strategy. SLP upgraded diet to regular/thin liquids. Continue to utilize chin tuck and NO STRAWS with liquid until repeated MBS is completed 03/09/23. During cognitive tasks, patient reports baseline ADHD for which she does not take medication and subsequent baseline deficits in attention and focus. These deficits were observed across various cognitive tasks with patient benefiting from min cues for complex monitoring, organization, and emergent awareness. Patient did sustain attention to  task with intermittent tangential thoughts but self-redirected throughout for 20+ minute task with modI. Patient was left in lowered bed with all needs in reach. SLP will continue to target goals per plan of care.      Pain Pain Assessment Pain Scale: 0-10 Pain Score: 0-No pain  Therapy/Group: Individual Therapy  Colleen Pearson, M.A., CCC-SLP  Colleen Pearson 03/08/2023, 12:26 PM

## 2023-03-08 NOTE — Progress Notes (Signed)
Met with patient.  Diet has been upgraded to thin but unable to have straws.  Feels that has improved greatly while being here. Reports no issues with voiding and Bms. Discussed team conference on Tuesday.  York Spaniel that she is discharging Wednesday.  Informed of team meeting again. Discussed diet and ways to reduce trig of 161.  Encouraged Ketogenic diet as well.  No real med changes for home. Encouraged smoking cessation. All needs met, call bell in reach.

## 2023-03-09 ENCOUNTER — Inpatient Hospital Stay (HOSPITAL_COMMUNITY): Payer: Medicaid Other

## 2023-03-09 DIAGNOSIS — R569 Unspecified convulsions: Secondary | ICD-10-CM | POA: Diagnosis not present

## 2023-03-09 MED ORDER — MELATONIN 5 MG PO TABS
5.0000 mg | ORAL_TABLET | Freq: Every evening | ORAL | 0 refills | Status: DC | PRN
  Filled 2023-03-09: qty 30, 30d supply, fill #0

## 2023-03-09 MED ORDER — POLYETHYLENE GLYCOL 3350 17 GM/SCOOP PO POWD
17.0000 g | Freq: Every day | ORAL | 0 refills | Status: DC
  Filled 2023-03-09: qty 476, 28d supply, fill #0

## 2023-03-09 MED ORDER — LEVETIRACETAM 1000 MG PO TABS
1000.0000 mg | ORAL_TABLET | Freq: Two times a day (BID) | ORAL | 0 refills | Status: AC
  Filled 2023-03-09: qty 60, 30d supply, fill #0

## 2023-03-09 MED ORDER — LAMOTRIGINE 150 MG PO TABS
150.0000 mg | ORAL_TABLET | Freq: Two times a day (BID) | ORAL | 0 refills | Status: DC
  Filled 2023-03-09: qty 60, 30d supply, fill #0

## 2023-03-09 MED ORDER — QUETIAPINE FUMARATE 100 MG PO TABS: 100.0000 mg | ORAL_TABLET | Freq: Every day | ORAL | 0 refills | Status: DC

## 2023-03-09 NOTE — Progress Notes (Signed)
Occupational Therapy Discharge Summary  Patient Details  Name: Colleen Pearson MRN: [id number] Date of Birth: 08-Mar-1991  Date of Discharge from OT service:March 09, 2023  Today's Date: 03/09/2023 OT Individual Time: 4098-1191 OT Individual Time Calculation (min): 69 min    Patient has met 7 of 7 long term goals due to improved activity tolerance, improved balance, postural control, ability to compensate for deficits, improved attention, and improved awareness.  Patient to discharge at overall Independent level.  Patient's care partner is independent to provide the necessary physical assistance at discharge.    Reasons goals not met: N/A  Recommendation:  Patient will benefit from ongoing skilled OT services in home health setting to continue to advance functional skills in the area of BADL, iADL, and Reduce care partner burden.  Equipment: TTB  Reasons for discharge: treatment goals met and discharge from hospital  Patient/family agrees with progress made and goals achieved: Yes  OT Discharge Precautions/Restrictions  Precautions Precautions: Fall Restrictions Weight Bearing Restrictions: No General Chart Reviewed: Yes Family/Caregiver Present: No Pain Pain Assessment Pain Scale: 0-10 Pain Score: 0-No pain ADL ADL Eating: Independent Where Assessed-Eating: Chair Grooming: Independent Where Assessed-Grooming: Standing at sink Upper Body Bathing: Independent Where Assessed-Upper Body Bathing: Shower (standing) Lower Body Bathing: Independent Where Assessed-Lower Body Bathing: Shower Upper Body Dressing: Independent Where Assessed-Upper Body Dressing: Standing at sink Lower Body Dressing: Independent Where Assessed-Lower Body Dressing: Standing at sink Toileting: Independent Where Assessed-Toileting: Teacher, adult education: Community education officer Method: Proofreader: Engineer, technical sales: Geographical information systems officer Method: Ship broker: Emergency planning/management officer, Engineer, manufacturing systems Transfer: Psychologist, counselling Method: Ambulating Vision Baseline Vision/History: 0 No visual deficits Patient Visual Report: No change from baseline Vision Assessment?: No apparent visual deficits Perception  Perception: Within Functional Limits Praxis Praxis: WFL Cognition Cognition Overall Cognitive Status: Within Functional Limits for tasks assessed Arousal/Alertness: Awake/alert Memory: Appears intact Memory Impairment: Storage deficit;Decreased recall of new information;Decreased short term memory Decreased Short Term Memory: Verbal basic;Verbal complex;Functional basic Attention: Focused;Sustained;Selective Focused Attention: Appears intact Sustained Attention: Appears intact Sustained Attention Impairment: Verbal basic;Verbal complex;Functional basic;Functional complex Selective Attention: Appears intact Selective Attention Impairment: Verbal basic;Verbal complex;Functional basic;Functional complex Awareness: Appears intact Problem Solving: Appears intact Problem Solving Impairment: Verbal complex;Functional basic Executive Function: Self Monitoring;Self Correcting;Reasoning Reasoning: Appears intact Self Monitoring: Appears intact Self Monitoring Impairment: Verbal complex;Functional basic Self Correcting: Appears intact Self Correcting Impairment: Verbal complex;Functional basic Behaviors: Impulsive Safety/Judgment: Appears intact Brief Interview for Mental Status (BIMS) Repetition of Three Words (First Attempt): 3 Temporal Orientation: Year: Correct Temporal Orientation: Month: Accurate within 5 days Temporal Orientation: Day: Correct Recall: "Sock": Yes, no cue required Recall: "Blue": Yes, no cue required Recall: "Bed": Yes, no cue required BIMS Summary Score: 15 Sensation Sensation Light Touch: Appears Intact Peripheral sensation comments: Pt  reports sensation back to normal Hot/Cold: Appears Intact Proprioception: Appears Intact Stereognosis: Not tested Coordination Gross Motor Movements are Fluid and Coordinated: Yes Fine Motor Movements are Fluid and Coordinated: Yes Motor  Motor Motor: Within Functional Limits Motor - Discharge Observations: WFL Mobility  Bed Mobility Bed Mobility: Sit to Supine;Supine to Sit Supine to Sit: Independent Sit to Supine: Independent Transfers Sit to Stand: Independent Stand to Sit: Independent  Trunk/Postural Assessment  Cervical Assessment Cervical Assessment: Within Functional Limits Thoracic Assessment Thoracic Assessment: Within Functional Limits Lumbar Assessment Lumbar Assessment: Within Functional Limits Postural Control Postural Control: Within Functional Limits  Balance Balance Balance Assessed: Yes Static Sitting Balance Static Sitting -  Balance Support: Feet unsupported;Feet supported Static Sitting - Level of Assistance: 7: Independent Dynamic Sitting Balance Dynamic Sitting - Balance Support: Feet supported Dynamic Sitting - Level of Assistance: 7: Independent Static Standing Balance Static Standing - Balance Support: During functional activity Static Standing - Level of Assistance: 7: Independent Dynamic Standing Balance Dynamic Standing - Balance Support: During functional activity Dynamic Standing - Level of Assistance: 7: Independent Dynamic Standing - Balance Activities: Reaching for objects Extremity/Trunk Assessment RUE Assessment RUE Assessment: Within Functional Limits Active Range of Motion (AROM) Comments: WFL General Strength Comments: WFL 5/5 LUE Assessment LUE Assessment: Within Functional Limits Active Range of Motion (AROM) Comments: WFL General Strength Comments: WFL 5/5  General: "I can't wait to see my daughter." Pt seated in bed upon OT arrival, agreeable to OT session.  Pain: no pain reported  ADL: Bed mobility: mod I  supine>EOB Grooming: mod I standing at sink Oral hygiene: mod I standing at sink, able to manipulate containers UB dressing: mod I overhead shirt donning/doffing in standing LB dressing: mod I seated EOB to thread over feet/standing to manage over waist, donning/doffing Footwear: mod I seated EOB for donning/doffing socks Shower transfer: mod I ambulating with no AD Bathing: mod I in standing for entire shower  Transfers: mod I overall with no AD for all transfers. Pt ambulating community distances without AD at Mod I level and ascending/descending multiple flights of steps without LOB   Pt seated in bed with bed alarm activated, 2 bed rails up, call light within reach and 4Ps assessed.   Velia Meyer, OTD, OTR/L 03/09/2023, 12:28 PM

## 2023-03-09 NOTE — Progress Notes (Signed)
PROGRESS NOTE   Subjective/Complaints:  Pt reports LBM overnight Voice is normal for her- husky- no changes from before admission.   Couldn't remember- thought Zollie Scale gave her Soduku she's working on- [employer] and I thought was SLP- she couldn't remember, even with cues.  Denies pain.    ROS:   Pt denies SOB, abd pain, CP, N/V/C/D, and vision changes  Except for HPI  Objective:   No results found. Recent Labs    03/08/23 0641  WBC 6.1  HGB 10.8*  HCT 31.4*  PLT 498*   Recent Labs    03/08/23 0641  NA 138  K 4.0  CL 105  CO2 27  GLUCOSE 107*  BUN 10  CREATININE 0.92  CALCIUM 8.6*     Intake/Output Summary (Last 24 hours) at 03/09/2023 0854 Last data filed at 03/09/2023 0845 Gross per 24 hour  Intake 675 ml  Output --  Net 675 ml        Physical Exam: Vital Signs Blood pressure 122/81, pulse 85, temperature 98.2 F (36.8 C), temperature source Oral, resp. rate 19, height 5\' 1"  (1.549 m), weight 59.1 kg, SpO2 100%.     General: awake, alert, appropriate, sitting up doing soduku slowly on bed- PT in room with me; NAD HENT: conjugate gaze; oropharynx moist CV: regular rate and rhythm; no JVD Pulmonary: CTA B/L; no W/R/R- good air movement GI: soft, NT, ND, (+)BS Psychiatric: appropriate Neurological: decreased STM- didn't remember who gave her soduku- even with cues Neurological: Ox3- more appropriate- still mild STM deficits Neuro:  Alert and oriented x4, follows commands, CN 2-12 intact, no dysarthria, soft voice, fair memory RUE: 5/5 Deltoid, 5/5 Biceps, 5/5 Triceps, 5/5 Wrist Ext, 5/5 Grip LUE: 5/5 Deltoid, 5/5 Biceps, 5/5 Triceps, 5/5 Wrist Ext, 5/5 Grip RLE: HF 5/5, KE 5/5,  ADF 5/5, APF 5/5 LLE: HF 2/5, KE 4-/5, ADF 5/5, APF 5/5 Sensory exam normal for light touch and pain in all 4 limbs. No limb ataxia or cerebellar signs. No abnormal tone appreciated.    Musculoskeletal: mild  C spine paraspinal tenderness, L thigh with tenderness and pain with PROM, mild R wrist tenderness RUE IV- looks ok  Assessment/Plan: 1. Functional deficits which require 3+ hours per day of interdisciplinary therapy in a comprehensive inpatient rehab setting. Physiatrist is providing close team supervision and 24 hour management of active medical problems listed below. Physiatrist and rehab team continue to assess barriers to discharge/monitor patient progress toward functional and medical goals  Care Tool:  Bathing    Body parts bathed by patient: Right arm, Right lower leg, Left lower leg, Face, Left arm, Chest, Front perineal area, Abdomen, Buttocks, Right upper leg, Left upper leg         Bathing assist Assist Level: Supervision/Verbal cueing     Upper Body Dressing/Undressing Upper body dressing   What is the patient wearing?: Pull over shirt    Upper body assist Assist Level: Supervision/Verbal cueing    Lower Body Dressing/Undressing Lower body dressing      What is the patient wearing?: Underwear/pull up, Pants     Lower body assist Assist for lower body dressing: Supervision/Verbal cueing  Toileting Toileting    Toileting assist Assist for toileting: Supervision/Verbal cueing     Transfers Chair/bed transfer  Transfers assist     Chair/bed transfer assist level: Supervision/Verbal cueing     Locomotion Ambulation   Ambulation assist      Assist level: Supervision/Verbal cueing Assistive device: Walker-rolling Max distance: 150   Walk 10 feet activity   Assist     Assist level: Supervision/Verbal cueing Assistive device: Walker-rolling   Walk 50 feet activity   Assist    Assist level: Supervision/Verbal cueing Assistive device: Walker-rolling    Walk 150 feet activity   Assist Walk 150 feet activity did not occur: Safety/medical concerns (covid restrictions, could not leave room)  Assist level: Supervision/Verbal  cueing Assistive device: Walker-rolling    Walk 10 feet on uneven surface  activity   Assist Walk 10 feet on uneven surfaces activity did not occur: Safety/medical concerns (covid restrictions, could not leave room)         Wheelchair     Assist Is the patient using a wheelchair?: No             Wheelchair 50 feet with 2 turns activity    Assist            Wheelchair 150 feet activity     Assist          Blood pressure 122/81, pulse 85, temperature 98.2 F (36.8 C), temperature source Oral, resp. rate 19, height 5\' 1"  (1.549 m), weight 59.1 kg, SpO2 100%.  Medical Problem List and Plan: 1. Functional deficits secondary to status epilepticus with delirium and left thigh rhabdomyolysis and pain              -patient may shower             -ELOS/Goals: 10-12 days, Mod I to supervision PT/OT/SLP  Con't CIR PT , OT and SLPd/c Wendesday 9/18 Con't CIR PT, OT and SLP Team conference today to finalize d/c- mod I in room 2. Thrombotics: -DVT/anticoagulation:  Pharmaceutical: Lovenox             -antiplatelet therapy: N/A 3. Pain Management: Tylenol prn.  4. Anxiety: would benefit from seeing neuropsych.             -antipsychotic agents: Seroquel-->ativan and haldol d/c 5. Neuropsych/cognition: This patient is not fully capable of making decisions on his own behalf. 6. Skin/Wound Care: Routine pressure relief measures.  7. Fluids/Electrolytes/Nutrition: Monitor I/O. Check CMET in am. 8. Seizure d/o: Continue Keppra and Lamictal.             --status epilepticus due to THC/Covid and resolved. Followed by Dr. Malvin [facility])  9/12- cont' seizure precautions.  9. Aspiration PNA/Covid PNA:   MRSA+ BAL-> Vanc-->Linezolid thru 09/12 for 7 day course antibiotic              --aspiration precautions due to dysphagia. Covid precautions thru 09/14  9/13- educated pt would rather her 7 yr old daughter come tomorrow when off precautions for covid.   9/16- off covid  precautions  9/17- still on MRSA precautions 10. Tachycardia: Likely due to deconditioning with HR 120-140. Monitor for symptoms.  11.  Left thigh pain/swelling: Traumatic due to rhabdo. CK improving from 47,715-->2,6909 --AKI has resolved. Tylenol prn for pain.  9/12- will recheck CK in AM 9/13- CK down to 1994- con't to check weekly since doing so well 9/16- CK down to 1625 12. Delirium: Will continue Seroquel 50 mg am/100 mg  HS  9/12- working well  9/17- Changed her Seroquel to 10am this weekend- is sleeping well and denies any issues on current dosing- will send home on Seroquel, but will need f/u with Dr Berline Chough to determine when coming off it.  13.  Dysphagia: DYS 2 with honey thick diet.  Continue SLP  Continue D3 thin as of 9/12 afternoon  9/16- now on regular diet as of 9/16 14.  Marijuana use.  History of cocaine use.  Advised cessation  15.  Anemia/thrombocytosis.  Hgb up to 10.8 on 9/9.    Platelets reviewed and trending down  9/16- Plts down to 498 16. Leukocytosis-   9/12- up to 15.3 from 13.1 from 10 4 days ago- will check U/A and Cx- also finished Linezolid for aspiration PNA-    WBC reviewed and is stable. UA reviewed and is negative.   9/16- WBC 6.1 17. Transaminitis  9/12- LFTS's 154 and 317- up from 80/36 on 9/2- will reduce tylenol to 325 mg and recheck Monday and Thursday  9/16- Down to 54 and 134 (154 and 317)- looking much better    I spent a total of 35    minutes on total care today- >50% coordination of care- due to  D/w team - moved d/c to tomorrow- made mod I in room- team conference today to finalize d/c.   LOS: 6 days A FACE TO FACE EVALUATION WAS PERFORMED  Carolena Fairbank 03/09/2023, 8:54 AM

## 2023-03-09 NOTE — Plan of Care (Signed)
  Problem: RH Attention Goal: LTG Patient will demonstrate this level of attention during functional activites (SLP) Description: LTG:  Patient will will demonstrate this level of attention during functional activites (SLP) Outcome: Not Met (add Reason) Note: Suspect baseline attention deficits d/t ADHD diagnosis   Problem: RH Pre-functional/Other (Specify) Goal: RH LTG SLP (Specify) 1 Description: RH LTG SLP (Specify) 1 Outcome: Not Met (add Reason) Note: Baseline deficits   Problem: RH Problem Solving Goal: LTG Patient will demonstrate problem solving for (SLP) Description: LTG:  Patient will demonstrate problem solving for basic/complex daily situations with cues  (SLP) Outcome: Completed/Met   Problem: RH Memory Goal: LTG Patient will demonstrate ability for day to day (SLP) Description: LTG:   Patient will demonstrate ability for day to day recall/carryover during cognitive/linguistic activities with assist  (SLP) Outcome: Completed/Met

## 2023-03-09 NOTE — Plan of Care (Signed)
  Problem: RH Grooming Goal: LTG Patient will perform grooming w/assist,cues/equip (OT) Description: LTG: Patient will perform grooming with assist, with/without cues using equipment (OT) Outcome: Completed/Met Flowsheets (Taken 03/04/2023 1542) LTG: Pt will perform grooming with assistance level of: Independent with assistive device    Problem: RH Bathing Goal: LTG Patient will bathe all body parts with assist levels (OT) Description: LTG: Patient will bathe all body parts with assist levels (OT) Outcome: Completed/Met Flowsheets (Taken 03/09/2023 1231) LTG: Pt will perform bathing with assistance level/cueing: Independent with assistive device    Problem: RH Dressing Goal: LTG Patient will perform upper body dressing (OT) Description: LTG Patient will perform upper body dressing with assist, with/without cues (OT). Outcome: Completed/Met Flowsheets (Taken 03/04/2023 1542) LTG: Pt will perform upper body dressing with assistance level of: Independent with assistive device Goal: LTG Patient will perform lower body dressing w/assist (OT) Description: LTG: Patient will perform lower body dressing with assist, with/without cues in positioning using equipment (OT) Outcome: Completed/Met Flowsheets (Taken 03/04/2023 1542) LTG: Pt will perform lower body dressing with assistance level of: Independent with assistive device   Problem: RH Toileting Goal: LTG Patient will perform toileting task (3/3 steps) with assistance level (OT) Description: LTG: Patient will perform toileting task (3/3 steps) with assistance level (OT)  Outcome: Completed/Met Flowsheets (Taken 03/09/2023 1231) LTG: Pt will perform toileting task (3/3 steps) with assistance level: Independent with assistive device   Problem: RH Toilet Transfers Goal: LTG Patient will perform toilet transfers w/assist (OT) Description: LTG: Patient will perform toilet transfers with assist, with/without cues using equipment (OT) Outcome:  Completed/Met Flowsheets (Taken 03/04/2023 1542) LTG: Pt will perform toilet transfers with assistance level of: Independent with assistive device   Problem: RH Tub/Shower Transfers Goal: LTG Patient will perform tub/shower transfers w/assist (OT) Description: LTG: Patient will perform tub/shower transfers with assist, with/without cues using equipment (OT) Outcome: Completed/Met Flowsheets (Taken 03/04/2023 1542) LTG: Pt will perform tub/shower stall transfers with assistance level of: Independent with assistive device

## 2023-03-09 NOTE — Progress Notes (Signed)
Speech Language Pathology Discharge Summary  Patient Details  Name: Colleen Pearson MRN: [id number] Date of Birth: 24-Sep-1990  Date of Discharge from SLP service:March 09, 2023  Patient has met 2 of 4 long term goals.  Patient to discharge at overall Modified Independent;Supervision level.  Reasons goals not met: baseline attention deficits   Clinical Impression/Discharge Summary: Patient has made excellent gains towards therapy goals meeting 2/4 long term goals set this admission. While patient fell short of meeting remaining goals for attention and complex information processing, patient reports uncontrolled ADHD at baseline which impacted attention prior to hospital admission. Patient and SLP discussed current accommodations made by patient to help with attention deficits during work and when managing the household, for which patient can describe use of these aids with independence. Patient currently utilizes memory aids with modI and solves basic and complex problems with modI. During updated MBS completed 03/09/23, patient demonstrates oropharyngeal swallowing function that is [facility] and is safe to continue regular/thin liquid diet at home with modI. Patient is appropriate for discharge from CIR SLP services but would benefit from outpatient SLP services should she wish to seek assistance with ADHD management. Patient discharging with overall modI needs.    Care Partner:  Caregiver Able to Provide Assistance: Yes  Type of Caregiver Assistance: Cognitive  Recommendation:  Outpatient SLP  Rationale for SLP Follow Up: Maximize cognitive function and independence   Equipment: n/a   Reasons for discharge: Discharged from hospital   Patient/Family Agrees with Progress Made and Goals Achieved: Yes   Jeannie Done, M.A., CCC-SLP   [name] 03/09/2023, 9:27 AM

## 2023-03-09 NOTE — Patient Care Conference (Signed)
Inpatient RehabilitationTeam Conference and Plan of Care Update Date:03/09/2023  Time: 11:11 AM    Patient Name: Colleen Pearson      Medical Record Number: [id number]  Date of Birth: [birth year] Sex: Female         Room/Bed: 4W17C/4W17C-01 Payor Info: Payor: [place] MEDICAID PREPAID HEALTH PLAN / Plan: [place] MEDICAID [place] COMPLETE HEALTH / Product Type: *No Product type* /    Admit Date/Time:  03/03/2023  5:22 PM  Primary Diagnosis:  Seizures [facility])  Hospital Problems: Principal Problem:   Seizures (HCC) Active Problems:   Rhabdomyolysis   Debility   Delirium   Dysphagia   Polysubstance abuse (HCC)   Left thigh pain   Tachycardia   Metabolic encephalopathy    Expected Discharge Date: Expected Discharge Date: 03/10/23  Team Members Present: Physician leading conference: Dr. Genice Rouge Social Worker Present: Cecile Sheerer, LCSWA Nurse Present: Vedia Pereyra, RN PT Present: Bernie Covey, PT OT Present: Roney Mans, OT SLP Present: Feliberto Gottron, SLP PPS Coordinator present : Fae Pippin, SLP     Current Status/Progress Goal Weekly Team Focus  Bowel/Bladder   Pt continent B/B  LBM 03/09/23   Pt will maintain normal B/B PATTERN   Assist with toileting qshift and prn    Swallow/Nutrition/ Hydration   regular/thin with chin tuck, minA   supervision  MBS to be completed 03/09/23    ADL's   Mod I for functional transfers and ADLs, made mod I in room end of day 9/16   mod I   D/C on 9/17,  full ADL tomorrow    Mobility   at goal level   supervision overall  endurance, balance    Communication                Safety/Cognition/ Behavioral Observations  overall modI for basic tasks, supervision-min for more complex   modI   complex problem solving/attention to detail    Pain   Pt denies pain at this time.   Pt will be free from pain and will verbalize pain whenever she is pain   Assess pt for pain qshift/prn and provide education regarding  pharmacological/non pharmacological treatments    Skin   Skin is warm and dry with no breakdown   Will maintain skin intergrity  Assess skin and educate pt on prevention of skin breakdown      Discharge Planning:  Pt will d/c to her mother's  home, and her mother will provide 24/7 care. TTB bench ordered. Will set up for outpatient therapies for PT/OT/SLP. SW will confirm there are no barriers to discharge.   Team Discussion: Seizures. Time toileting. COVID precautions removed. Contact precautions continue for MRSA. Not taking pain medications. Sleep chart. Tolerating regular diet.  Daily weight.  Out patient speech.  Mod I in room.  Patient on target to meet rehab goals: yes, on target for discharge 03/10/23  *See Care Plan and progress notes for long and short-term goals.   Revisions to Treatment Plan:  Will keep on Seroquel at this time.  Monitor labs/VS Teaching Needs: Medications, safety, self care, gait/transfer training, etc.   Current Barriers to Discharge: Decreased caregiver support and Behavior  Possible Resolutions to Barriers: Family education Order recommended DME if needed.      Medical Summary Current Status: timed toileting- continent B/B- no straws- off covid precautions; but on contact precautions still  Barriers to Discharge: Self-care education;Other (comments)  Barriers to Discharge Comments: cognition/seizures-  main issue- will not wean Seroquel for  now- advised to stay off illicit substances- Possible Resolutions to [employer] Focus: educate don illicits; had MBSS- regular thin/straws now ok; will have her f/u with Dr Berline Chough to wean Seroquel-d/c tomorrow 9/18   Continued Need for Acute Rehabilitation Level of Care: The patient requires daily medical management by a physician with specialized training in physical medicine and rehabilitation for the following reasons: Direction of a multidisciplinary physical rehabilitation program to maximize  functional independence : Yes Medical management of patient stability for increased activity during participation in an intensive rehabilitation regime.: Yes Analysis of laboratory values and/or radiology reports with any subsequent need for medication adjustment and/or medical intervention. : Yes   I attest that I was present, lead the team conference, and concur with the assessment and plan of the team.   Jearld Adjutant 03/10/2023, 8:53 AM

## 2023-03-09 NOTE — Procedures (Signed)
Modified Barium Swallow Study  Patient Details  Name: Colleen Pearson MRN: [id number] Date of Birth: 07-Jul-1990  Today's Date: 03/09/2023  Modified Barium Swallow completed.  Full report located under Chart Review in the Imaging Section.  History of Present Illness Colleen Pearson is a 32 yo female presenting to ED 9/2 from home after a prolonged seizure (lasted at least 45 minutes continuously). Intubated upon EMS arrival 9/2-9/3, reintubated 9/4-9/6. Cortrak placed 9/4.  CTH 9/2 unremarkable. CXR 9/3 with new increased hazy opacities in lower lung fields and mid perihilar areas. PMH includes asthma, THC and cocaine use, endometriosis, ovarian cyst, seizures.  Clinical Impression Pt presents with oropharyngeal swallowing function that is grossly within normal limits with no remaining impairments to oral or pharyngeal timing, efficiency, or safety. Exam was limited by poor patient tolerance of barium, thus SLP only administered thin liquids, puree, and solids. Across trials, no significant penetration nor aspiration were observed. Notably, with straw sip of thin liquid x1, patient exhibited transient penetration above the level of the vocal folds which cleared in totality with completion of the swallow. Transient penetration is frequently noted in normal adult swallows and is not indicative of an underlying swallowing pathology. Recommend continuation of regular/thin liquid diet with no need for chin tuck given patient's improvement to swallowing function. Straws ok. Administer medications whole with thin liquids. SLP will continue to follow to assess ongoing diet tolerance for remainder of stay. Factors that may increase risk of adverse event in presence of aspiration Rubye Oaks & Clearance Coots 2021):    Swallow Evaluation Recommendations Recommendations: PO diet PO Diet Recommendation: Regular;Thin liquids (Level 0) Liquid Administration via: Cup;Straw Medication Administration: Whole meds with  liquid Supervision: Patient able to self-feed Swallowing strategies  : Minimize environmental distractions Postural changes: Position pt fully upright for meals Oral care recommendations: Oral care BID (2x/day)  Jeannie Done, M.A., CCC-SLP  Morrie Sheldon A Ashok Sawaya 03/09/2023,10:03 AM

## 2023-03-09 NOTE — Progress Notes (Signed)
Patient ID: Colleen Pearson, female   DOB: 1991/01/27, 32 y.o.   MRN: [id number]  SW ordered TTB with [employer] [facility] via parachute.   1231-SW spoke with pt mother Cala Bradford to provide updates from team conference, and d/c date 9/18. [facility] for outpatient PT/SLP (p:[phone]/f:[phone]).  *SW met with pt in room to inform on above, and aware on outpatient referral will be sent. TTB in room.   SW faxed outpatient referral.   Cecile Sheerer, MSW, LCSWA Office: [phone] Cell: [phone] Fax: [phone]

## 2023-03-09 NOTE — Progress Notes (Signed)
Physical Therapy Discharge Summary  Patient Details  Name: Colleen Pearson MRN: [id number] Date of Birth: 05/11/1991  Date of Discharge from PT service:March 09, 2023  Today's Date: 03/09/2023 PT Individual Time: 5621-3086, 5784-6962 PT Individual Time Calculation (min): 55 min, 75 min    Patient has met 10 of 10 long term goals due to improved activity tolerance, improved balance, improved postural control, increased strength, and decreased pain.  Patient to discharge at an ambulatory level Supervision.   Patient's care partner is independent to provide the necessary cognitive assistance at discharge.  Reasons goals not met: NA  Recommendation:  Patient does not require follow up PT, will benefit from regular exercise program.  Equipment: No equipment provided  Reasons for discharge: treatment goals met and discharge from hospital  Patient/family agrees with progress made and goals achieved: Yes  Skilled Therapeutic Interventions/Progress Updates:  Session 1: pt received in bed and agreeable to therapy. No complaint of pain. Session focused on gait training and dual tasking. D/c testing performed as documented below.  Pt ambulated throughout hospital, navigating using signs, navigating hospital stairs with supervision, and participated in cognitive tasks in cafeteria (adding costs/budgeting).  Discussed d/c plan and pt agrees she has accomplished her rehab goals. Pt returned to room and remained with all needs in reach.   Session 2:  pt received in bed and agreeable to therapy. No complaint of pain.   Pt ambulated at mod I level throughout session. Session focused on providing HEP as listed below. Pt returned demoed all exercises. Also discussed using youtube videos for general exercise and hand out for love your brain yoga.    Access Code: [email or web address] URL: [email or web address] Date: 03/09/2023 Prepared by: Colleen Pearson  Exercises - Mini Squat  - 1  x daily - 7 x weekly - 4 sets - 15 reps - Wall Squat  - 1 x daily - 7 x weekly - 3 sets - 5 reps - 15 hold - Side Stepping with Resistance at Ankles  - 1 x daily - 7 x weekly - 4 sets - 20 reps - Standing Anti-Rotation Press with Anchored Resistance  - 1 x daily - 7 x weekly - 4 sets - 10 reps - Standing Row with Anchored Resistance  - 1 x daily - 7 x weekly - 4 sets - 15 reps - Chest Press with Resistance  - 1 x daily - 7 x weekly - 4 sets - 10 reps - Single Leg Bridge  - 1 x daily - 7 x weekly - 4 sets - 6 reps - Isometric Dead Bug  - 1 x daily - 7 x weekly - 4 reps - 30 sec  hold - Quadruped Alternating Leg Extensions  - 1 x daily - 7 x weekly - 4 sets - 8 reps - Full Plank  - 1 x daily - 7 x weekly - 1 sets - 4 reps - 20 hold - Wall Push Up  - 1 x daily - 7 x weekly - 6 sets - 8 reps  Pt then ambulated down to ahec entrance for fresh air, and continued discussion of rehab goals and d/c. Pt returned to room and to bed with all needs in reach.   PT Discharge Precautions/Restrictions Precautions Precautions: Fall;Other (comment) Restrictions Weight Bearing Restrictions: No Vital Signs Therapy Vitals Temp: 98.2 F (36.8 C) Temp Source: Oral Pulse Rate: 85 Resp: 19 BP: 122/81 Patient Position (if appropriate): Sitting Oxygen Therapy SpO2: 100 % O2 Device: Room Air Pain  0/10 Pain Interference Pain Interference Pain Effect on Sleep: 1. Rarely or not at all Pain Interference with Therapy Activities: 1. Rarely or not at all Pain Interference with Day-to-Day Activities: 1. Rarely or not at all Vision/Perception  Vision - History Ability to See in Adequate Light: 0 Adequate Perception Perception: Within Functional Limits Praxis Praxis: WFL  Cognition Overall Cognitive Status: Impaired/Different from baseline Arousal/Alertness: Awake/alert Orientation Level: Oriented X4 Year: 2024 Month: September Day of Week: Correct Focused Attention: Appears intact Safety/Judgment:  Appears intact Sensation Sensation Light Touch: Appears Intact Peripheral sensation comments: Pt reports sensation back to normal Proprioception: Appears Intact Coordination Gross Motor Movements are Fluid and Coordinated: Yes Motor  Motor Motor: Within Functional Limits  Mobility Bed Mobility Bed Mobility: Sit to Supine;Supine to Sit Supine to Sit: Independent Sit to Supine: Independent Transfers Transfers: Stand to Sit;Sit to Stand Sit to Stand: Independent Stand to Sit: Independent Locomotion  Gait Ambulation: Yes Gait Assistance: Independent Gait Distance (Feet): 1000 Feet Assistive device: None Gait Gait: Yes Gait Pattern: Impaired (early heel off/slight toe walking, which pt reports is baseline) Stairs / Additional Locomotion Stairs: Yes Stairs Assistance: Supervision/Verbal cueing Stair Management Technique: One rail Left Number of Stairs: 80 Height of Stairs: 6 Ramp: Supervision/Verbal cueing Curb: Supervision/Verbal cueing Pick up small object from the floor assist level: Supervision/Verbal cueing Wheelchair Mobility Wheelchair Mobility: No  Trunk/Postural Assessment  Cervical Assessment Cervical Assessment: Within Functional Limits Thoracic Assessment Thoracic Assessment: Within Functional Limits Lumbar Assessment Lumbar Assessment: Within Functional Limits Postural Control Postural Control: Within Functional Limits  Balance Balance Balance Assessed: Yes Static Sitting Balance Static Sitting - Balance Support: Feet unsupported;Feet supported Static Sitting - Level of Assistance: 7: Independent Dynamic Sitting Balance Dynamic Sitting - Balance Support: Feet supported Dynamic Sitting - Level of Assistance: 7: Independent Static Standing Balance Static Standing - Balance Support: During functional activity Static Standing - Level of Assistance: 7: Independent Dynamic Standing Balance Dynamic Standing - Balance Support: During functional  activity Dynamic Standing - Level of Assistance: 7: Independent Extremity Assessment      RLE Assessment RLE Assessment: Within Functional Limits General Strength Comments: Grossly 5/5 LLE Assessment LLE Assessment: Within Functional Limits General Strength Comments: grossly 5/5   Macil Crady C Ival Pacer 03/09/2023, 9:10 AM

## 2023-03-10 ENCOUNTER — Telehealth (HOSPITAL_COMMUNITY): Payer: Self-pay | Admitting: Pharmacy Technician

## 2023-03-10 ENCOUNTER — Other Ambulatory Visit (HOSPITAL_COMMUNITY): Payer: Self-pay

## 2023-03-10 DIAGNOSIS — R569 Unspecified convulsions: Secondary | ICD-10-CM | POA: Diagnosis not present

## 2023-03-10 MED ORDER — QUETIAPINE FUMARATE 100 MG PO TABS
100.0000 mg | ORAL_TABLET | Freq: Every day | ORAL | 0 refills | Status: DC
  Filled 2023-03-10: qty 30, 30d supply, fill #0

## 2023-03-10 MED ORDER — QUETIAPINE FUMARATE 50 MG PO TABS
ORAL_TABLET | ORAL | 0 refills | Status: DC
  Filled 2023-03-10: qty 90, 30d supply, fill #0

## 2023-03-10 MED ORDER — QUETIAPINE FUMARATE 50 MG PO TABS
ORAL_TABLET | ORAL | 0 refills | Status: DC
  Filled 2023-03-10: qty 45, 15d supply, fill #0

## 2023-03-10 MED ORDER — QUETIAPINE FUMARATE 50 MG PO TABS
ORAL_TABLET | ORAL | 0 refills | Status: DC
Start: 2023-03-10 — End: 2024-02-08
  Filled 2023-03-10: qty 90, 30d supply, fill #0

## 2023-03-10 NOTE — Telephone Encounter (Signed)
Pharmacy Patient Advocate Encounter  Received notification from [employer] that Prior Authorization for QUEtiapine Fumarate 50MG  tablets  has been APPROVED from 03/10/2023 to 06/22/2023   PA #/Case ID/Reference #: [id number]

## 2023-03-10 NOTE — Progress Notes (Signed)
PROGRESS NOTE   Subjective/Complaints:  Pt reports LBM overnight Voice is normal for her- husky- no changes from before admission.   Couldn't remember- thought Zollie Scale gave her Soduku she's working on- [employer] and I thought was SLP- she couldn't remember, even with cues.  Denies pain.    ROS:   Pt denies SOB, abd pain, CP, N/V/C/D, and vision changes  Except for HPI  Objective:   DG Swallowing Func-Speech Pathology  Result Date: 03/09/2023 Table formatting from the original result was not included. Modified Barium Swallow Study Patient Details Name: Colleen Pearson MRN: [id number] Date of Birth: June 19, 1991 Today's Date: 03/09/2023 HPI/PMH: HPI: Colleen Pearson is a 32 yo female presenting to ED 9/2 from home after a prolonged seizure (lasted at least 45 minutes continuously). Intubated upon EMS arrival 9/2-9/3, reintubated 9/4-9/6. Cortrak placed 9/4.  CTH 9/2 unremarkable. CXR 9/3 with new increased hazy opacities in lower lung fields and mid perihilar areas. PMH includes asthma, THC and cocaine use, endometriosis, ovarian cyst, seizures. Clinical Impression: Clinical Impression: Pt presents with oropharyngeal swallowing function that is grossly within normal limits with no remaining impairments to oral or pharyngeal timing, efficiency, or safety. Exam was limited by poor patient tolerance of barium, thus SLP only administered thin liquids, puree, and solids. Across trials, no significant penetration nor aspiration were observed. Notably, with straw sip of thin liquid x1, patient exhibited transient penetration above the level of the vocal folds which cleared in totality with completion of the swallow. Transient penetration is frequently noted in normal adult swallows and is not indicative of an underlying swallowing pathology. Recommend continuation of regular/thin liquid diet with no need for chin tuck given patient's  improvement to swallowing function. Straws ok. Administer medications whole with thin liquids. SLP will continue to follow to assess ongoing diet tolerance for remainder of stay. Factors that may increase risk of adverse event in presence of aspiration Colleen Pearson): No data recorded Recommendations/Plan: Swallowing Evaluation Recommendations Swallowing Evaluation Recommendations Recommendations: PO diet PO Diet Recommendation: Regular; Thin liquids (Level 0) Liquid Administration via: Cup; Straw Medication Administration: Whole meds with liquid Supervision: Patient able to self-feed Swallowing strategies  : Minimize environmental distractions Postural changes: Position pt fully upright for meals Oral care recommendations: Oral care BID (2x/day) Treatment Plan Treatment Plan Treatment recommendations: Therapy as outlined in treatment plan below Follow-up recommendations: Acute inpatient rehab (3 hours/day) Functional status assessment: Patient has had a recent decline in their functional status and demonstrates the ability to make significant improvements in function in a reasonable and predictable amount of time. Treatment frequency: Min 2x/week Treatment duration: 2 weeks Interventions: Diet toleration management by SLP Recommendations Recommendations for follow up therapy are one component of a multi-disciplinary discharge planning process, led by the attending physician.  Recommendations may be updated based on patient status, additional functional criteria and insurance authorization. Assessment: Orofacial Exam: Orofacial Exam Oral Cavity: Oral Hygiene: WFL Oral Cavity - Dentition: Adequate natural dentition Orofacial Anatomy: WFL Oral Motor/Sensory Function: WFL Anatomy: Anatomy: WFL Boluses Administered: Boluses Administered Boluses Administered: Thin liquids (Level 0); Puree; Solid  Oral Impairment Domain: Oral Impairment Domain Lip Closure: No labial escape Tongue control  during bolus hold: Not  tested (Poor tolerance of barium taste, no bolus hold requested from patient. No oral phase impairments noted across all boluses) Bolus preparation/mastication: Timely and efficient chewing and mashing Bolus transport/lingual motion: Brisk tongue motion Oral residue: Complete oral clearance Initiation of pharyngeal swallow : Posterior laryngeal surface of the epiglottis  Pharyngeal Impairment Domain: Pharyngeal Impairment Domain Soft palate elevation: No bolus between soft palate (SP)/pharyngeal wall (PW) Laryngeal elevation: Complete superior movement of thyroid cartilage with complete approximation of arytenoids to epiglottic petiole Anterior hyoid excursion: Complete anterior movement Epiglottic movement: Complete inversion Laryngeal vestibule closure: Incomplete, narrow column air/contrast in laryngeal vestibule Pharyngeal stripping wave : Present - complete Pharyngeal contraction (A/P view only): N/A Pharyngoesophageal segment opening: Complete distension and complete duration, no obstruction of flow Tongue base retraction: No contrast between tongue base and posterior pharyngeal wall (PPW) Pharyngeal residue: Complete pharyngeal clearance Location of pharyngeal residue: N/A  Esophageal Impairment Domain: No data recorded Pill: No data recorded Penetration/Aspiration Scale Score: Penetration/Aspiration Scale Score 1.  Material does not enter airway: Puree; Solid 2.  Material enters airway, remains ABOVE vocal cords then ejected out: Thin liquids (Level 0) Compensatory Strategies: Compensatory Strategies Compensatory strategies: No   General Information: Caregiver present: No  Diet Prior to this Study: Regular; Thin liquids (Level 0)   Temperature : Normal   Respiratory Status: WFL   Supplemental O2: None (Room air)   History of Recent Intubation: Yes  Behavior/Cognition: Alert; Cooperative; Pleasant mood Self-Feeding Abilities: Able to self-feed Baseline vocal quality/speech: Normal Volitional Cough: Able to  elicit Volitional Swallow: Able to elicit Exam Limitations: Poor bolus acceptance Goal Planning: Prognosis for improved oropharyngeal function: Good Barriers to Reach Goals: Cognitive deficits Barriers/Prognosis Comment: baseline attention deficits Patient/Family Stated Goal: none stated Consulted and agree with results and recommendations: Patient Pain: Pain Assessment Pain Assessment: No/denies pain End of Session: Start Time:No data recorded Stop Time: No data recorded Time Calculation:No data recorded Charges: No data recorded SLP visit diagnosis: SLP Visit Diagnosis: Cognitive communication deficit (Z61.096) Past Medical History: Past Medical History: Diagnosis Date  Asthma   Cocaine use complicating pregnancy in third trimester 08/30/2017  Endometriosis   Ovarian cyst   Seizures (HCC)   Status post cesarean section 08/30/2017 Past Surgical History: Past Surgical History: Procedure Laterality Date  CESAREAN SECTION N/A 08/30/2017  Procedure: CESAREAN SECTION;  Surgeon: Conard Novak, MD;  Location: [facility] ORS;  Service: Obstetrics;  Laterality: N/A;  LASER ABLATION/CAUTERIZATION OF ENDOMETRIAL IMPLANTS   Jeannie Done, M.A., CCC-SLP Morrie Sheldon A Ellin 03/09/2023, 10:04 AM  Recent Labs    03/08/23 0641  WBC 6.1  HGB 10.8*  HCT 31.4*  PLT 498*   Recent Labs    03/08/23 0641  NA 138  K 4.0  CL 105  CO2 27  GLUCOSE 107*  BUN 10  CREATININE 0.92  CALCIUM 8.6*     Intake/Output Summary (Last 24 hours) at 03/10/2023 0817 Last data filed at 03/10/2023 0734 Gross per 24 hour  Intake 594 ml  Output --  Net 594 ml        Physical Exam: Vital Signs Blood pressure 111/66, pulse 76, temperature 98 F (36.7 C), temperature source Oral, resp. rate 18, height 5\' 1"  (1.549 m), weight 62.1 kg, SpO2 100%.     General: awake, alert, appropriate, sitting up doing soduku slowly on bed- PT in room with me; NAD HENT: conjugate gaze; oropharynx moist CV: regular rate and rhythm; no JVD Pulmonary:  CTA B/L; no W/R/R- good  air movement GI: soft, NT, ND, (+)BS Psychiatric: appropriate Neurological: decreased STM- didn't remember who gave her soduku- even with cues Neurological: Ox3- more appropriate- still mild STM deficits Neuro:  Alert and oriented x4, follows commands, CN 2-12 intact, no dysarthria, soft voice, fair memory RUE: 5/5 Deltoid, 5/5 Biceps, 5/5 Triceps, 5/5 Wrist Ext, 5/5 Grip LUE: 5/5 Deltoid, 5/5 Biceps, 5/5 Triceps, 5/5 Wrist Ext, 5/5 Grip RLE: HF 5/5, KE 5/5,  ADF 5/5, APF 5/5 LLE: HF 2/5, KE 4-/5, ADF 5/5, APF 5/5 Sensory exam normal for light touch and pain in all 4 limbs. No limb ataxia or cerebellar signs. No abnormal tone appreciated.    Musculoskeletal: mild C spine paraspinal tenderness, L thigh with tenderness and pain with PROM, mild R wrist tenderness RUE IV- looks ok  Assessment/Plan: 1. Functional deficits which require 3+ hours per day of interdisciplinary therapy in a comprehensive inpatient rehab setting. Physiatrist is providing close team supervision and 24 hour management of active medical problems listed below. Physiatrist and rehab team continue to assess barriers to discharge/monitor patient progress toward functional and medical goals  Care Tool:  Bathing    Body parts bathed by patient: Right arm, Right lower leg, Left lower leg, Face, Left arm, Chest, Front perineal area, Abdomen, Buttocks, Right upper leg, Left upper leg         Bathing assist Assist Level: Independent     Upper Body Dressing/Undressing Upper body dressing   What is the patient wearing?: Pull over shirt    Upper body assist Assist Level: Independent    Lower Body Dressing/Undressing Lower body dressing      What is the patient wearing?: Underwear/pull up, Pants     Lower body assist Assist for lower body dressing: Independent     Toileting Toileting    Toileting assist Assist for toileting: Independent     Transfers Chair/bed transfer  Transfers  assist     Chair/bed transfer assist level: Independent     Locomotion Ambulation   Ambulation assist      Assist level: Independent Assistive device: No Device Max distance: >1,000 ft   Walk 10 feet activity   Assist     Assist level: Independent Assistive device: No Device   Walk 50 feet activity   Assist    Assist level: Independent Assistive device: No Device    Walk 150 feet activity   Assist Walk 150 feet activity did not occur: Safety/medical concerns (covid restrictions, could not leave room)  Assist level: Independent Assistive device: No Device    Walk 10 feet on uneven surface  activity   Assist Walk 10 feet on uneven surfaces activity did not occur: Safety/medical concerns (covid restrictions, could not leave room)   Assist level: Supervision/Verbal cueing     Wheelchair     Assist Is the patient using a wheelchair?: No             Wheelchair 50 feet with 2 turns activity    Assist            Wheelchair 150 feet activity     Assist          Blood pressure 111/66, pulse 76, temperature 98 F (36.7 C), temperature source Oral, resp. rate 18, height 5\' 1"  (1.549 m), weight 62.1 kg, SpO2 100%.  Medical Problem List and Plan: 1. Functional deficits secondary to status epilepticus with delirium and left thigh rhabdomyolysis and pain              -  patient may shower             -ELOS/Goals: 10-12 days, Mod I to supervision PT/OT/SLP  D/c today Gave Rx that said can return to work 9/23- per pt request Per pt, hasn't used illicits in 5 years 2. Thrombotics: -DVT/anticoagulation:  Pharmaceutical: Lovenox             -antiplatelet therapy: N/A 3. Pain Management: Tylenol prn.  9/18- went over with pt about only using tylenol 325 mg- doesn't want/need opiates 4. Anxiety: would benefit from seeing neuropsych.             -antipsychotic agents: Seroquel-->ativan and haldol d/c 5. Neuropsych/cognition: This  patient is not fully capable of making decisions on his own behalf. 6. Skin/Wound Care: Routine pressure relief measures.  7. Fluids/Electrolytes/Nutrition: Monitor I/O. Check CMET in am. 8. Seizure d/o: Continue Keppra and Lamictal.             --status epilepticus due to THC/Covid and resolved. Followed by Dr. Malvin Johns [facility])  9/12- cont' seizure precautions.  9. Aspiration PNA/Covid PNA:   MRSA+ BAL-> Vanc-->Linezolid thru 09/12 for 7 day course antibiotic              --aspiration precautions due to dysphagia. Covid precautions thru 09/14  9/13- educated pt would rather her 78 yr old daughter come tomorrow when off precautions for covid.   9/16- off covid precautions  9/17- still on MRSA precautions 10. Tachycardia: Likely due to deconditioning with HR 120-140. Monitor for symptoms.  11.  Left thigh pain/swelling: Traumatic due to rhabdo. CK improving from 47,715-->2,6909 --AKI has resolved. Tylenol prn for pain.  9/12- will recheck CK in AM 9/13- CK down to 1994- con't to check weekly since doing so well 9/16- CK down to 1625 12. Delirium: Will continue Seroquel 50 mg am/100 mg HS  9/12- working well  9/17- Changed her Seroquel to 10am this weekend- is sleeping well and denies any issues on current dosing- will send home on Seroquel, but will need f/u with Dr Berline Chough to determine when coming off it.  13.  Dysphagia: DYS 2 with honey thick diet.  Continue SLP  Continue D3 thin as of 9/12 afternoon  9/16- now on regular diet as of 9/16 14.  Marijuana use.  History of cocaine use.  Advised cessation  15.  Anemia/thrombocytosis.  Hgb up to 10.8 on 9/9.    Platelets reviewed and trending down  9/16- Plts down to 498 16. Leukocytosis-   9/12- up to 15.3 from 13.1 from 10 4 days ago- will check U/A and Cx- also finished Linezolid for aspiration PNA-    WBC reviewed and is stable. UA reviewed and is negative.   9/16- WBC 6.1 17. Transaminitis  9/12- LFTS's 154 and 317- up from 80/36 on 9/2-  will reduce tylenol to 325 mg and recheck Monday and Thursday  9/16- Down to 54 and 134 (154 and 317)- looking much better  9/18- educated pt to NOT use more than 325 mg Tylenol due to elevated LFTs- but doing better since taking less tylenol   I spent a total of 36   minutes on total care today- >50% coordination of care- due to  Writing out Rx for return to work- also d/w PA about plan- needs to f/u with me- did go over with pt about reduced dose of tylenol.    LOS: 7 days A FACE TO FACE EVALUATION WAS PERFORMED  Seaver Machia 03/10/2023, 8:18 AM

## 2023-03-10 NOTE — Progress Notes (Addendum)
Inpatient Rehabilitation Discharge Medication Review by a Pharmacist  A complete drug regimen review was completed for this patient to identify any potential clinically significant medication issues.  High Risk Drug Classes Is patient taking? Indication by Medication  Antipsychotic Yes Quetiapine - delerium  Anticoagulant No   Antibiotic No   Opioid No   Antiplatelet No   Hypoglycemics/insulin No   Vasoactive Medication No   Chemotherapy No   Other Yes Lamotrigine, levetiracetam - seizures Miralax - laxative Nexplanon - contraception  PRNs: Melatonin - sleep Guaifenesin-dextromethorphan - cough     Type of Medication Issue Identified Description of Issue Recommendation(s)  Drug Interaction(s) (clinically significant)     Duplicate Therapy     Allergy     No Medication Administration End Date     Incorrect Dose  Quetiapine bedtime dose ordered at dc but not morning dose. Discussed with P. Love, PA-C. Daytime dose now ordered at dc.  Additional Drug Therapy Needed     Significant med changes from prior encounter (inform family/care partners about these prior to discharge). New Quetiapine. Staying off PRN cetirizine. Discuss with patient/family prior to discharge.  Other       Clinically significant medication issues were identified that warrant physician communication and completion of prescribed/recommended actions by midnight of the next day:  Yes  Name of provider notified for urgent issues identified:  - Delle Reining, PA-C  Provider Method of Notification:    - Phone call  Pharmacist comments:  - Per discussion, Quetiapine 50 mg am and 100 mg pm to continue at discharge.  Potential tapering per PCP as outpatient.   Time spent performing this drug regimen review (minutes):  20   Colleen Pearson, [place] 03/10/2023 8:37 AM

## 2023-03-10 NOTE — Telephone Encounter (Addendum)
Pharmacy Patient Advocate Encounter   Received notification that prior authorization for QUEtiapine Fumarate 50MG  tablets is required/requested.   Insurance verification completed.   The patient is insured through [employer] [place] .   Per test claim: PA required; PA submitted to [employer]  Medicaid via CoverMyMeds Key/confirmation #/[facility] Status is pending

## 2023-03-10 NOTE — Progress Notes (Signed)
Inpatient Rehabilitation Care Coordinator Discharge Note   Patient Details  Name: Colleen Pearson MRN: [id number] Date of Birth: 1990-07-26   Discharge location: D/c to home with her mother and family  Length of Stay: 6 days  Discharge activity level: Supervision  Home/community participation: Limited  Patient response NW:GNFAOZ Literacy - How often do you need to have someone help you when you read instructions, pamphlets, or other written material from your doctor or pharmacy?: Rarely  Patient response HY:QMVHQI Isolation - How often do you feel lonely or isolated from those around you?: Never  Services provided included: RD, PT, MD, OT, SLP, RN, TR, Pharmacy, Neuropsych, SW, CM  Financial Services:  Field seismologist Utilized: Private Insurance [place] Complete health  Choices offered to/list presented to: patient  Follow-up services arranged:  Outpatient, DME    Outpatient Servicies: [facility] for outpatient PT/SLP DME : [facility] for TTB    Patient response to transportation need: Is the patient able to respond to transportation needs?: Yes In the past 12 months, has lack of transportation kept you from medical appointments or from getting medications?: No In the past 12 months, has lack of transportation kept you from meetings, work, or from getting things needed for daily living?: No   Patient/Family verbalized understanding of follow-up arrangements:  Yes  Individual responsible for coordination of the follow-up plan: contac pt mother Cala Bradford [phone]  Confirmed correct DME delivered: Gretchen Short 03/10/2023    Comments (or additional information): fam edu completed  Summary of Stay    Date/Time Discharge Planning CSW  03/09/23 0945 Pt will d/c to her mother's  home, and her mother will provide 24/7 care. TTB bench ordered. Will set up for outpatient therapies for PT/OT/SLP. SW will confirm there are no barriers to  discharge. AAC       Syncere Kaminski A Lula Olszewski

## 2023-03-10 NOTE — Discharge Summary (Signed)
Physician Discharge Summary  Patient ID: Colleen Pearson MRN: [id number] DOB/AGE: [birth year] 32 y.o.  Admit date: 03/03/2023 Discharge date: 03/13/2023  Discharge Diagnoses:  Principal Problem:   Debility Active Problems:   Seizures (HCC)   Rhabdomyolysis   Dysphagia   Polysubstance abuse (HCC)   Tachycardia   Discharged Condition: stable  Significant Diagnostic Studies: DG Swallowing Func-Speech Pathology  Result Date: 03/09/2023 Table formatting from the original result was not included. Modified Barium Swallow Study Patient Details Name: Colleen Pearson MRN: [id number] Date of Birth: 12/15/1990 Today's Date: 03/09/2023 HPI/PMH: HPI: Colleen Pearson is a 32 yo female presenting to ED 9/2 from home after a prolonged seizure (lasted at least 45 minutes continuously). Intubated upon EMS arrival 9/2-9/3, reintubated 9/4-9/6. Cortrak placed 9/4.  CTH 9/2 unremarkable. CXR 9/3 with new increased hazy opacities in lower lung fields and mid perihilar areas. PMH includes asthma, THC and cocaine use, endometriosis, ovarian cyst, seizures. Clinical Impression: Clinical Impression: Pt presents with oropharyngeal swallowing function that is grossly within normal limits with no remaining impairments to oral or pharyngeal timing, efficiency, or safety. Exam was limited by poor patient tolerance of barium, thus SLP only administered thin liquids, puree, and solids. Across trials, no significant penetration nor aspiration were observed. Notably, with straw sip of thin liquid x1, patient exhibited transient penetration above the level of the vocal folds which cleared in totality with completion of the swallow. Transient penetration is frequently noted in normal adult swallows and is not indicative of an underlying swallowing pathology. Recommend continuation of regular/thin liquid diet with no need for chin tuck given patient's improvement to swallowing function. Straws ok. Administer medications whole  with thin liquids. SLP will continue to follow to assess ongoing diet tolerance for remainder of stay. Factors that may increase risk of adverse event in presence of aspiration Colleen Pearson & Clearance Coots 2021): No data recorded Recommendations/Plan: Swallowing Evaluation Recommendations Swallowing Evaluation Recommendations Recommendations: PO diet PO Diet Recommendation: Regular; Thin liquids (Level 0) Liquid Administration via: Cup; Straw Medication Administration: Whole meds with liquid Supervision: Patient able to self-feed Swallowing strategies  : Minimize environmental distractions Postural changes: Position pt fully upright for meals Oral care recommendations: Oral care BID (2x/day) Treatment Plan Treatment Plan Treatment recommendations: Therapy as outlined in treatment plan below Follow-up recommendations: Acute inpatient rehab (3 hours/day) Functional status assessment: Patient has had a recent decline in their functional status and demonstrates the ability to make significant improvements in function in a reasonable and predictable amount of time. Treatment frequency: Min 2x/week Treatment duration: 2 weeks Interventions: Diet toleration management by SLP Recommendations Recommendations for follow up therapy are one component of a multi-disciplinary discharge planning process, led by the attending physician.  Recommendations may be updated based on patient status, additional functional criteria and insurance authorization. Assessment: Orofacial Exam: Orofacial Exam Oral Cavity: Oral Hygiene: WFL Oral Cavity - Dentition: Adequate natural dentition Orofacial Anatomy: WFL Oral Motor/Sensory Function: WFL Anatomy: Anatomy: WFL Boluses Administered: Boluses Administered Boluses Administered: Thin liquids (Level 0); Puree; Solid  Oral Impairment Domain: Oral Impairment Domain Lip Closure: No labial escape Tongue control during bolus hold: Not tested (Poor tolerance of barium taste, no bolus hold requested from patient.  No oral phase impairments noted across all boluses) Bolus preparation/mastication: Timely and efficient chewing and mashing Bolus transport/lingual motion: Brisk tongue motion Oral residue: Complete oral clearance Initiation of pharyngeal swallow : Posterior laryngeal surface of the epiglottis  Pharyngeal Impairment Domain: Pharyngeal Impairment Domain Soft palate elevation: No bolus between  soft palate (SP)/pharyngeal wall (PW) Laryngeal elevation: Complete superior movement of thyroid cartilage with complete approximation of arytenoids to epiglottic petiole Anterior hyoid excursion: Complete anterior movement Epiglottic movement: Complete inversion Laryngeal vestibule closure: Incomplete, narrow column air/contrast in laryngeal vestibule Pharyngeal stripping wave : Present - complete Pharyngeal contraction (A/P view only): N/A Pharyngoesophageal segment opening: Complete distension and complete duration, no obstruction of flow Tongue base retraction: No contrast between tongue base and posterior pharyngeal wall (PPW) Pharyngeal residue: Complete pharyngeal clearance Location of pharyngeal residue: N/A  Esophageal Impairment Domain: No data recorded Pill: No data recorded Penetration/Aspiration Scale Score: Penetration/Aspiration Scale Score 1.  Material does not enter airway: Puree; Solid 2.  Material enters airway, remains ABOVE vocal cords then ejected out: Thin liquids (Level 0) Compensatory Strategies: Compensatory Strategies Compensatory strategies: No   General Information: Caregiver present: No  Diet Prior to this Study: Regular; Thin liquids (Level 0)   Temperature : Normal   Respiratory Status: WFL   Supplemental O2: None (Room air)   History of Recent Intubation: Yes  Behavior/Cognition: Alert; Cooperative; Pleasant mood Self-Feeding Abilities: Able to self-feed Baseline vocal quality/speech: Normal Volitional Cough: Able to elicit Volitional Swallow: Able to elicit Exam Limitations: Poor bolus  acceptance Goal Planning: Prognosis for improved oropharyngeal function: Good Barriers to Reach Goals: Cognitive deficits Barriers/Prognosis Comment: baseline attention deficits Patient/Family Stated Goal: none stated Consulted and agree with results and recommendations: Patient Pain: Pain Assessment Pain Assessment: No/denies pain End of Session: Start Time:No data recorded Stop Time: No data recorded Time Calculation:No data recorded Charges: No data recorded SLP visit diagnosis: SLP Visit Diagnosis: Cognitive communication deficit (W09.811) Past Medical History: Past Medical History: Diagnosis Date  Asthma   Cocaine use complicating pregnancy in third trimester 08/30/2017  Endometriosis   Ovarian cyst   Seizures (HCC)   Status post cesarean section 08/30/2017 Past Surgical History: Past Surgical History: Procedure Laterality Date  CESAREAN SECTION N/A 08/30/2017  Procedure: CESAREAN SECTION;  Surgeon: Conard Novak, MD;  Location: [facility] ORS;  Service: Obstetrics;  Laterality: N/A;  LASER ABLATION/CAUTERIZATION OF ENDOMETRIAL IMPLANTS   Jeannie Done, M.A., CCC-SLP [name] 03/09/2023, 10:04 AM   Labs:  Basic Metabolic Panel: Recent Labs  Lab 03/03/23 1137 03/04/23 0758 03/08/23 0641  NA 135 137 138  K 4.0 4.0 4.0  CL 105 101 105  CO2 19* 20* 27  GLUCOSE 119* 92 107*  BUN 19 16 10   CREATININE 0.85 0.88 0.92  CALCIUM 8.4* 8.7* 8.6*    CBC: Recent Labs  Lab 03/04/23 0758 03/05/23 1622 03/06/23 0805 03/08/23 0641  WBC 15.3* 7.9 7.1 6.1  NEUTROABS 11.4* 5.6 5.4  --   HGB 11.9* 10.7* 10.5* 10.8*  HCT 36.0 31.0* 30.6* 31.4*  MCV 86.5 85.9 86.4 87.2  PLT 711* 621* 537* 498*    CBG: No results for input(s): "GLUCAP" in the last 168 hours.  Brief HPI:   Colleen Pearson is a 32 y.o. female with history of asthma, polysubstance abuse, seizures with last breakthrough seizure June 2024 who was admitted on 02/22/23 with status epilepticus and seizure lasting greater than 45  minutes.  She was loaded with IV Keppra and required multiple doses of midazolam. she was unresponsive and intubated for airway protection.  She was treated with IV fluids, fluid boluses and started on ceftriaxone for possible aspiration pneumonia.  UDS positive for benzos and THC.  She did develop fever of 102 with posturing and rapid EEG showed focal seizure right temporal region.  Lamictal was resumed, she was loaded with phenobarbital and AKI with severe rhabdo treated with bicarb drip.  On 09/04, she required intubation due to hypercapnic respiratory failure and was found to be COVID-positive.  She underwent bronchoscopy with therapeutic aspiration of RLL and LLL and antibiotics broadened to Vanco/Zosyn.  Fluid overload was treated with IV diuresis as well as BiPAP as needed.  She tolerated extubation and was started on modified diet due to silent aspiration on MBS.  Neurology felt that breakthrough seizure was due to [facility] use as well as lowered seizure threshold due to COVID.  Respiratory status was improving and she was weaned to room air.  Therapy was initiated and  she was limited by deconditioning, tachycardia with activity, cognitive deficits and delay in processing. CIR was recommended due to functional decline.    Hospital Course: Colleen Pearson was admitted to rehab 03/03/2023 for inpatient therapies to consist of PT, ST and OT at least three hours five days a week. Past admission physiatrist, therapy team and rehab RN have worked together to provide customized collaborative inpatient rehab. She was maintained on covid precautions per protocol. Her respiratory status and heart rate has been stable.  Her blood pressures were monitored on TID basis and has been controlled. Follow up CBC showed ABLA to be stable and thrombocytosis is resolving. Follow up check of BMET showed lytes to be WNL and CO2 levels have normalized. CK is improving and has trended down to 1994. Abnormal LFTs are resolving.    MBS performed on 09/17 was negative for signs or symptoms of aspiration and regular diet with thin liquids was recommended. Delirium has resolved and she was maintained on seroquel 50 mg am and 100 mg HS as she was tolerating this without SE. p.o. intake has been good and she is continent of bowel and bladder.  Activity tolerance and mentation has greatly improved.  She has been seizure-free during her stay.  She has progressed to independent level and will receive follow-up outpatient PT and ST at [facility] outpatient rehab after discharge.   Rehab course: During patient's stay in rehab team conference was held to monitor patient's progress, set goals and discuss barriers to discharge. At admission, patient required min assist with ADL task and with mobility. She exhibited mild cognitive linguistic deficits with deficits in attention, memory and problem-solving.  Cognitive evaluation revealed SLUMS score 22/30.  Dysphagia 3 diet with thin liquids was recommended with intermittent supervision.  She  has had improvement in activity tolerance, balance, postural control as well as ability to compensate for deficits.  She is able to complete ADL tasks at modified independent level. She is independent for transfers and to ambulate >1000 without AD. Family education has been completed.   Discharge disposition: 01-Home or Self Care  Diet: Regular  Special Instructions: No driving till seizure-free for 6 months.  Neurology to give clearance for resumption of driving. Avoid alcohol and drugs. Recommend repeat check of LFTs and CBC in 1 to 2 weeks to monitor for improvement.  Discharge Instructions     Ambulatory referral to Physical Therapy   Complete by: As directed    TR pass prn   Ambulatory referral to Speech Therapy   Complete by: As directed    TR pass prn      Allergies as of 03/10/2023       Reactions   Ultram [tramadol] Hives, Swelling        Medication List     STOP taking these  medications    levocetirizine 5 MG tablet Commonly known as: XYZAL       TAKE these medications    guaiFENesin-dextromethorphan 100-10 MG/5ML syrup Commonly known as: ROBITUSSIN DM Take 10 mLs by mouth every 6 (six) hours as needed for cough.   lamoTRIgine 150 MG tablet Commonly known as: LAMICTAL Take 1 tablet (150 mg total) by mouth 2 (two) times daily.   levETIRAcetam 1000 MG tablet Commonly known as: Keppra Take 1 tablet (1,000 mg total) by mouth 2 (two) times daily.   melatonin 5 MG Tabs Take 1 tablet (5 mg total) by mouth at bedtime as needed.   Nexplanon 68 MG Impl implant Generic drug: etonogestrel 1 each (68 mg total) by Subdermal route once for 1 dose.   polyethylene glycol powder 17 GM/SCOOP powder Commonly known as: GLYCOLAX/MIRALAX Take 17 g by mouth daily.   QUEtiapine 50 MG tablet Commonly known as: SEROquel Take 2 tablets (100 mg total) by mouth at bedtime AND 1 tablet (50 mg total) daily at 6 (six) AM. What changed: See the new instructions.        Follow-up Information     Morene Crocker, MD Follow up.   Specialty: Neurology Why: Call in 1-2 days for post hospital follow up Contact information: [address] [facility] [place] [address] [phone]         Genice Rouge, MD. Call.   Specialty: Physical Medicine and Rehabilitation Why: As needed Contact information: [address] [place] [address] [phone]         [facility] Follow up.   Why: Call in 1-2 days for post hospital follow up Contact information: [address] [place] [address] [phone]                 Signed: Jacquelynn Cree 03/13/2023, 5:16 PM

## 2023-03-11 ENCOUNTER — Telehealth: Payer: Self-pay | Admitting: *Deleted

## 2023-03-11 NOTE — Telephone Encounter (Signed)
  Pharmacy Patient Advocate Encounter   Received notification from [employer] that Prior Authorization for QUEtiapine Fumarate 50MG  tablets  has been APPROVED from 03/10/2023 to 06/22/2023    PA #/Case ID/Reference #: [id number]

## 2023-06-25 NOTE — Progress Notes (Deleted)
     GYNECOLOGY OFFICE PROCEDURE NOTE  Colleen Pearson is a 33 y.o. [phone] here for Nexplanon  removal*** Nexplanon  insertion.  Last pap smear was on 10/19/17 and was normal.  No other gynecologic concerns.  Nexplanon  Insertion Procedure Patient identified, informed consent performed, consent signed.   Patient does understand that irregular bleeding is a very common side effect of this medication. She was advised to have backup contraception for one week after placement. Pregnancy test in clinic today was negative.  Appropriate time out taken.  Patient's left arm was prepped and draped in the usual sterile fashion. The ruler used to measure and mark insertion area.  Patient was prepped with alcohol swab and then injected with 3 ml of 1% lidocaine .  She was prepped with betadine , Nexplanon  removed from packaging,  Device confirmed in needle, then inserted full length of needle and withdrawn per handbook instructions. Nexplanon  was able to palpated in the patient's arm; patient palpated the insert herself. There was minimal blood loss.  Patient insertion site covered with guaze and a pressure bandage to reduce any bruising.  The patient tolerated the procedure well and was given post procedure instructions.   Nexplanon  Removal Patient identified, informed consent performed, consent signed.   Appropriate time out taken. Nexplanon  site identified.  Area prepped in usual sterile fashon. One ml of 1% lidocaine  was used to anesthetize the area at the distal end of the implant. A small stab incision was made right beside the implant on the distal portion.  The Nexplanon  rod was grasped using hemostats and removed without difficulty.  There was minimal blood loss. There were no complications.  3 ml of 1% lidocaine  was injected around the incision for post-procedure analgesia.  Steri-strips were applied over the small incision.  A pressure bandage was applied to reduce any bruising.  The patient tolerated the  procedure well and was given post procedure instructions.  Patient is planning to use *** for contraception/attempt conception.  Nexplanon  Removal and Reinsertion Patient identified, informed consent performed, consent signed.   Patient does understand that irregular bleeding is a very common side effect of this medication. She was advised to have backup contraception for one week after replacement of the implant. Pregnancy test in clinic today was negative.  Appropriate time out taken. Nexplanon  site identified in left arm.  Area prepped in usual sterile fashon. One ml of 1% lidocaine  was used to anesthetize the area at the distal end of the implant. A small stab incision was made right beside the implant on the distal portion. The Nexplanon  rod was grasped using hemostats and removed without difficulty. There was minimal blood loss. There were no complications. Area was then injected with 3 ml of 1 % lidocaine . She was re-prepped with betadine , Nexplanon  removed from packaging, Device confirmed in needle, then inserted full length of needle and withdrawn per handbook instructions. Nexplanon  was able to palpated in the patient's arm; patient palpated the insert herself.  There was minimal blood loss. Patient insertion site covered with gauze and a pressure bandage to reduce any bruising. The patient tolerated the procedure well and was given post procedure instructions.  She was advised to have backup contraception for one week.      Jakie Raisin, CMA [facility]

## 2023-06-28 ENCOUNTER — Ambulatory Visit: Payer: Medicaid Other | Admitting: Obstetrics

## 2023-07-13 ENCOUNTER — Ambulatory Visit: Payer: Medicaid Other | Admitting: Licensed Practical Nurse

## 2023-07-13 VITALS — BP 108/69 | HR 71 | Wt 147.2 lb

## 2023-07-13 DIAGNOSIS — Z3046 Encounter for surveillance of implantable subdermal contraceptive: Secondary | ICD-10-CM | POA: Diagnosis not present

## 2023-07-13 NOTE — Progress Notes (Unsigned)
   GYNECOLOGY PROCEDURE NOTE  Implanon removal discussed in detail.  Risks of infection, bleeding, nerve injury all reviewed.  Patient understands risks and desires to proceed.  Verbal consent obtained.  Patient is certain she wants the implanon removed.  All questions answered.  Procedure: Patient placed in dorsal supine with left arm above head, elbow flexed at 90 degrees, arm resting on examination table.  Implanon identified without problems.  Betadine scrub x3.  1 ml of 1% lidocaine injected under implanon device without problems.  Sterile gloves applied.  Small 0.5cm incision made at distal tip of implanon device with 11 blade scalpel.  Implanon brought to incision and grasped with a small kelly clamp.  Implanon removed intact without problems.  Pressure applied to incision.  Hemostasis obtained.  Steri-strips applied, followed by bandage and compression dressing.  Patient tolerated procedure well.  No complications.   Assessment: 33 y.o. year old female now s/p uncomplicated implanon removal.  Plan: 1.  Patient given post procedure precautions and asked to call for fever, chills, redness or drainage from her incision, bleeding from incision.  She understands she will likely have a small bruise near site of removal and can remove bandage tomorrow and steri-strips in approximately 1 week.  2) Contraception: declines. Risk of pregnancy discussed with pt. She is open to pregnancy.   J2001 for lidocaine block, [id number] for nexplanon removal   Cindra Eves, SNM Carie Caddy, CNM present for all portions of care.

## 2023-07-14 ENCOUNTER — Encounter: Payer: Self-pay | Admitting: Licensed Practical Nurse

## 2023-09-15 ENCOUNTER — Other Ambulatory Visit: Payer: Self-pay

## 2023-09-15 ENCOUNTER — Emergency Department
Admission: EM | Admit: 2023-09-15 | Discharge: 2023-09-15 | Discharge status: Against Medical Advice | Attending: Emergency Medicine | Admitting: Emergency Medicine

## 2023-09-15 DIAGNOSIS — Z5321 Procedure and treatment not carried out due to patient leaving prior to being seen by health care provider: Secondary | ICD-10-CM | POA: Insufficient documentation

## 2023-09-15 DIAGNOSIS — R569 Unspecified convulsions: Secondary | ICD-10-CM | POA: Insufficient documentation

## 2023-09-15 LAB — CBC
HCT: 32.6 % — ABNORMAL LOW (ref 36.0–46.0)
Hemoglobin: 11.5 g/dL — ABNORMAL LOW (ref 12.0–15.0)
MCH: 28.8 pg (ref 26.0–34.0)
MCHC: 35.3 g/dL (ref 30.0–36.0)
MCV: 81.5 fL (ref 80.0–100.0)
Platelets: 246 10*3/uL (ref 150–400)
RBC: 4 MIL/uL (ref 3.87–5.11)
RDW: 12.4 % (ref 11.5–15.5)
WBC: 4.4 10*3/uL (ref 4.0–10.5)
nRBC: 0 % (ref 0.0–0.2)

## 2023-09-15 LAB — BASIC METABOLIC PANEL
Anion gap: 6 (ref 5–15)
BUN: 15 mg/dL (ref 6–20)
CO2: 22 mmol/L (ref 22–32)
Calcium: 8.3 mg/dL — ABNORMAL LOW (ref 8.9–10.3)
Chloride: 110 mmol/L (ref 98–111)
Creatinine, Ser: 0.93 mg/dL (ref 0.44–1.00)
GFR, Estimated: >60 mL/min (ref >60)
Glucose, Bld: 79 mg/dL (ref 70–99)
Potassium: 3.9 mmol/L (ref 3.5–5.1)
Sodium: 138 mmol/L (ref 135–145)

## 2023-09-15 LAB — CBG MONITORING, ED: Glucose-Capillary: 75 mg/dL (ref 70–99)

## 2023-09-15 NOTE — ED Triage Notes (Signed)
 Pt to ED via [facility] from work for witnessed seizure today. Hx of same, takes 1000mg  Keppra daily in divided dose; took 500mg  this morning as normal. Pt alert, oriented. Only injury pt is aware of is tip of tongue that she bit with the seizure.   Keppra level not available on standing orders but sent red top in case ordered by EDP.

## 2023-09-15 NOTE — ED Triage Notes (Signed)
 First Nurse Note: Patient to ED via [facility] from work for a seizure. Hx of same. Alert but not fully oriented at this time per EMS. Take Keppra. Had last seizure 1 month ago.   97% RA 96 cbg 100 HR 150/70

## 2023-12-17 ENCOUNTER — Emergency Department

## 2023-12-17 ENCOUNTER — Encounter: Payer: Self-pay | Admitting: Emergency Medicine

## 2023-12-17 ENCOUNTER — Emergency Department
Admission: EM | Admit: 2023-12-17 | Discharge: 2023-12-17 | Disposition: A | Discharge status: Home / Self Care | Attending: Emergency Medicine | Admitting: Emergency Medicine

## 2023-12-17 ENCOUNTER — Other Ambulatory Visit: Payer: Self-pay

## 2023-12-17 DIAGNOSIS — O99891 Other specified diseases and conditions complicating pregnancy: Secondary | ICD-10-CM | POA: Insufficient documentation

## 2023-12-17 DIAGNOSIS — O99012 Anemia complicating pregnancy, second trimester: Secondary | ICD-10-CM | POA: Diagnosis not present

## 2023-12-17 DIAGNOSIS — Z87898 Personal history of other specified conditions: Secondary | ICD-10-CM

## 2023-12-17 DIAGNOSIS — O3680X0 Pregnancy with inconclusive fetal viability, not applicable or unspecified: Secondary | ICD-10-CM

## 2023-12-17 DIAGNOSIS — R55 Syncope and collapse: Secondary | ICD-10-CM | POA: Insufficient documentation

## 2023-12-17 DIAGNOSIS — Z3492 Encounter for supervision of normal pregnancy, unspecified, second trimester: Secondary | ICD-10-CM

## 2023-12-17 LAB — URINALYSIS, ROUTINE W REFLEX MICROSCOPIC
Bilirubin Urine: NEGATIVE
Glucose, UA: NEGATIVE mg/dL
Hgb urine dipstick: NEGATIVE
Ketones, ur: NEGATIVE mg/dL
Leukocytes,Ua: NEGATIVE
Nitrite: NEGATIVE
Protein, ur: NEGATIVE mg/dL
Specific Gravity, Urine: 1.008 (ref 1.005–1.030)
pH: 7 (ref 5.0–8.0)

## 2023-12-17 LAB — COMPREHENSIVE METABOLIC PANEL WITH GFR
ALT: 10 U/L (ref 0–44)
AST: 15 U/L (ref 15–41)
Albumin: 3.1 g/dL — ABNORMAL LOW (ref 3.5–5.0)
Alkaline Phosphatase: 39 U/L (ref 38–126)
Anion gap: 10 (ref 5–15)
BUN: 9 mg/dL (ref 6–20)
CO2: 20 mmol/L — ABNORMAL LOW (ref 22–32)
Calcium: 8.4 mg/dL — ABNORMAL LOW (ref 8.9–10.3)
Chloride: 105 mmol/L (ref 98–111)
Creatinine, Ser: 0.66 mg/dL (ref 0.44–1.00)
GFR, Estimated: >60 mL/min (ref >60)
Glucose, Bld: 92 mg/dL (ref 70–99)
Potassium: 3.7 mmol/L (ref 3.5–5.1)
Sodium: 135 mmol/L (ref 135–145)
Total Bilirubin: 0.3 mg/dL (ref 0.0–1.2)
Total Protein: 6.2 g/dL — ABNORMAL LOW (ref 6.5–8.1)

## 2023-12-17 LAB — CBC WITH DIFFERENTIAL/PLATELET
Abs Immature Granulocytes: 0.03 10*3/uL (ref 0.00–0.07)
Basophils Absolute: 0 10*3/uL (ref 0.0–0.1)
Basophils Relative: 0 %
Eosinophils Absolute: 0.1 10*3/uL (ref 0.0–0.5)
Eosinophils Relative: 1 %
HCT: 29.6 % — ABNORMAL LOW (ref 36.0–46.0)
Hemoglobin: 10.5 g/dL — ABNORMAL LOW (ref 12.0–15.0)
Immature Granulocytes: 1 %
Lymphocytes Relative: 17 %
Lymphs Abs: 1 10*3/uL (ref 0.7–4.0)
MCH: 29.4 pg (ref 26.0–34.0)
MCHC: 35.5 g/dL (ref 30.0–36.0)
MCV: 82.9 fL (ref 80.0–100.0)
Monocytes Absolute: 0.3 10*3/uL (ref 0.1–1.0)
Monocytes Relative: 5 %
Neutro Abs: 4.7 10*3/uL (ref 1.7–7.7)
Neutrophils Relative %: 76 %
Platelets: 211 10*3/uL (ref 150–400)
RBC: 3.57 MIL/uL — ABNORMAL LOW (ref 3.87–5.11)
RDW: 12.5 % (ref 11.5–15.5)
WBC: 6.1 10*3/uL (ref 4.0–10.5)
nRBC: 0 % (ref 0.0–0.2)

## 2023-12-17 LAB — POC URINE PREG, ED: Preg Test, Ur: POSITIVE — AB

## 2023-12-17 LAB — HCG, QUANTITATIVE, PREGNANCY: hCG, Beta Chain, Quant, S: 123388 m[IU]/mL — ABNORMAL HIGH (ref <5)

## 2023-12-17 NOTE — ED Notes (Signed)
 POSITIVE PREGNANCY TEST. ORDER is not in yet.

## 2023-12-17 NOTE — ED Triage Notes (Addendum)
 Has history of seizures. Takes Keppra . Presented to [facility] for C/O 'feeling like a seizure was coming on today.  Patient has taken a home pregnancy test and states I need to know how far along I am so I can tell the neurologist.  Patient also c/o slight headache.  AAOx3.  Skin warm and dry. NAD. Ambulatory. MAE equally and strong.  Implant removed in January.  Has not had a period since then.  Having unprotected sex.

## 2023-12-17 NOTE — Discharge Instructions (Addendum)
 Your ultrasound today was reassuring.  Please follow-up with OB/GYN for further evaluation in your pregnancy.  Please contact your neurologist to discuss your seizure regimen further.  Return to the ER for new or worsening symptoms.

## 2023-12-17 NOTE — ED Triage Notes (Signed)
 Reports seizure X30 minutes ago witnessed by co workers.  Pale, double vision, and lips got purple per co workers so they had patient sit down before seizure.  Patient reports she took pregnancy test X4 weeks ago that was positive

## 2023-12-17 NOTE — ED Provider Notes (Signed)
 [facility] Provider Note    Event Date/Time   First MD Initiated Contact with Patient 12/17/23 1347     (approximate)   History   Headache   HPI  Colleen Pearson is a 33 year old female with history of seizure disorder presenting to the emergency department for evaluation following a near syncopal episode.  Shortly prior to presentation patient was at work when she began to feel lightheaded and her coworkers told her she looked pale.  She felt similar to how she does prior to having a seizure-like episode, but does not think she ever truly lost conscious nests or had any seizure-like activity.  She does note that she had a positive home pregnancy test about a month ago, has not yet seen OB/GYN.  Reported mild headache earlier, now resolved.  No numbness, tingling, focal weakness.  Had birth control implant removed in January, has not had a menstrual cycle since that time.  Also is concerned about how far she is in her pregnancy to help guide medication decisions with her neurologist related to her seizures.    Physical Exam   Triage Vital Signs: ED Triage Vitals  Encounter Vitals Group     BP 12/17/23 1245 (!) 98/58     Girls Systolic BP Percentile --      Girls Diastolic BP Percentile --      Boys Systolic BP Percentile --      Boys Diastolic BP Percentile --      Pulse Rate 12/17/23 1242 (!) 59     Resp 12/17/23 1245 16     Temp 12/17/23 1242 98.1 F (36.7 C)     Temp Source 12/17/23 1242 Oral     SpO2 12/17/23 1242 99 %     Weight 12/17/23 1237 142 lb 6.4 oz (64.6 kg)     Height 12/17/23 1237 5' 1 (1.549 m)     Head Circumference --      Peak Flow --      Pain Score 12/17/23 1242 8     Pain Loc --      Pain Education --      Exclude from Growth Chart --     Most recent vital signs: Vitals:   12/17/23 1242 12/17/23 1245  BP:  (!) 98/58  Pulse: (!) 59   Resp:  16  Temp: 98.1 F (36.7 C)   SpO2: 99%      General: Awake,  interactive  CV:  Regular rate, good peripheral perfusion.  Resp:  Unlabored respirations, lung clear to auscultation Abd:  Nondistended, soft, nontender Neuro:  Symmetric facial movement, fluid speech, 5-5 strength in bilateral upper and lower extremities with normal sensation   ED Results / Procedures / Treatments   Labs (all labs ordered are listed, but only abnormal results are displayed) Labs Reviewed  URINALYSIS, ROUTINE W REFLEX MICROSCOPIC - Abnormal; Notable for the following components:      Result Value   Color, Urine STRAW (*)    APPearance CLEAR (*)    All other components within normal limits  CBC WITH DIFFERENTIAL/PLATELET - Abnormal; Notable for the following components:   RBC 3.57 (*)    Hemoglobin 10.5 (*)    HCT 29.6 (*)    All other components within normal limits  COMPREHENSIVE METABOLIC PANEL WITH GFR - Abnormal; Notable for the following components:   CO2 20 (*)    Calcium  8.4 (*)    Total Protein 6.2 (*)    Albumin  3.1 (*)    All other components within normal limits  HCG, QUANTITATIVE, PREGNANCY - Abnormal; Notable for the following components:   hCG, Beta Chain, Quant, [place] [phone],611 (*)    All other components within normal limits  POC URINE PREG, ED - Abnormal; Notable for the following components:   Preg Test, Ur Positive (*)    All other components within normal limits     EKG EKG independently reviewed and interpreted by myself demonstrates:    RADIOLOGY Imaging independently reviewed and interpreted by myself demonstrates:  Ultrasound demonstrates IUP, radiology measures at 13 weeks, 5 days  Formal Radiology Read:  US  OB Comp Less 14 Wks Result Date: 12/17/2023 CLINICAL DATA:  [id number] Pregnancy of unknown anatomic location [id number] Syncope 106001. EXAM: OBSTETRIC <14 WK ULTRASOUND TECHNIQUE: Transabdominal ultrasound was performed for evaluation of the gestation as well as the maternal uterus and adnexal regions. COMPARISON:  None  Available. FINDINGS: Intrauterine gestational sac: Single Yolk sac:  Not Visualized. Embryo:  Visualized. Cardiac Activity: Visualized. Heart Rate: 145 bpm CRL:   78 mm   13 w 5 d                  US  EDC: 06/18/2024. Subchorionic hemorrhage:  None visualized. Maternal uterus/adnexae: Bilateral ovaries are within normal limits. IMPRESSION: *Single live intrauterine gestation the with crown-rump length corresponding to 13 weeks 5 days. Electronically Signed   By: Ree Molt M.D.   On: 12/17/2023 15:38    PROCEDURES:  Critical Care performed: No  Procedures   MEDICATIONS ORDERED IN ED: Medications - No data to display   IMPRESSION / MDM / ASSESSMENT AND PLAN / ED COURSE  I reviewed the triage vital signs and the nursing notes.  Differential diagnosis includes, but is not limited to, anemia, electrolyte abnormality, arrhythmia, seizure, ectopic pregnancy  Patient's presentation is most consistent with acute presentation with potential threat to life or bodily function.  33 year old female presenting following a seizure versus near syncopal episode.  Back at baseline here without ongoing complaints.  Labs with stable anemia, CMP without critical derangements.  UPT positive.  UA without evidence of infection.  No abdominal complaints, but given possible syncope in pregnancy of unknown location, ultrasound was obtained which did demonstrate IUP measuring at 13 weeks.  Patient updated on workup.  She remains without recurrent symptoms.  She is comfortable discharge home.  Will follow-up with OB/GYN and her neurologist.  Strict return precautions provided.  Patient discharged stable condition.      FINAL CLINICAL IMPRESSION(S) / ED DIAGNOSES   Final diagnoses:  Pregnancy of unknown anatomic location  Near syncope  Second trimester pregnancy     Rx / DC Orders   ED Discharge Orders     None        Note:  This document was prepared using Dragon voice recognition software and may  include unintentional dictation errors.   Levander Slate, MD 12/17/23 [phone]

## 2023-12-27 ENCOUNTER — Telehealth

## 2023-12-27 ENCOUNTER — Ambulatory Visit

## 2023-12-27 ENCOUNTER — Telehealth: Payer: Self-pay | Admitting: Obstetrics

## 2023-12-27 DIAGNOSIS — Z3689 Encounter for other specified antenatal screening: Secondary | ICD-10-CM

## 2023-12-27 DIAGNOSIS — Z3480 Encounter for supervision of other normal pregnancy, unspecified trimester: Secondary | ICD-10-CM | POA: Insufficient documentation

## 2023-12-27 DIAGNOSIS — O0990 Supervision of high risk pregnancy, unspecified, unspecified trimester: Secondary | ICD-10-CM | POA: Insufficient documentation

## 2023-12-27 NOTE — Progress Notes (Signed)
 New OB Intake  I connected with  Colleen Pearson on 12/27/23 at 10:15 AM EDT by MyChart/phone Visit and verified that I am speaking with the correct person using two identifiers. Nurse is located at [facility] and pt is located at HOME.  I discussed the limitations, risks, security and privacy concerns of performing an evaluation and management service by telephone and the availability of in person appointments. I also discussed with the patient that there may be a patient responsible charge related to this service. The patient expressed understanding and agreed to proceed.  I explained I am completing New OB Intake today. We discussed her EDD of 06/18/2024 that is based on LMP of unsure. Pt is G4/P1. I reviewed her allergies, medications, Medical/Surgical/OB history, and appropriate screenings. There are cats in the home: no. . Based on history, this is a/an pregnancy uncomplicated . Her obstetrical history is significant for smoker.  Patient Active Problem List   Diagnosis Date Noted   Supervision of other normal pregnancy, antepartum 12/27/2023   Debility 03/03/2023   Dysphagia 03/03/2023   Polysubstance abuse (HCC) 03/03/2023   Tachycardia 03/03/2023   Pneumonia due to COVID-19 virus 03/01/2023   Rhabdomyolysis 03/01/2023   Aspiration pneumonia of both lower lobes (HCC) 02/24/2023   Seizures (HCC) 02/24/2023   Status epilepticus (HCC) 02/22/2023   Nexplanon  in place 11/04/2020   Family history of Down syndrome     Concerns addressed today:   Delivery Plans:  Plans to deliver at [facility]  Anatomy US  Anatomy US  will be scheduled around [redacted] weeks gestational age.  Labs Discussed genetic screening with patient. Patient would like genetic testing to be drawn at new OB visit. Discussed possible labs to be drawn at new OB appointment.  COVID Vaccine Patient has had COVID vaccine.   Social Determinants of Health Food Insecurity: denies food  insecurity WIC Referral: Patient is interested in referral to [facility].  Transportation: Patient denies transportation needs. Childcare: Discussed no children allowed at ultrasound appointments.   First visit review I reviewed new OB appt with pt. I explained she will have blood work and pap smear/pelvic exam if indicated. Explained pt will be seen by Eleanor Canny CNM at first visit; encounter routed to appropriate provider.   Annalee VEAR Sanders, CMA 12/27/2023  10:43 AM

## 2023-12-27 NOTE — Telephone Encounter (Signed)
 I reached out to patient to reschedule her apt with Colleen Pearson she states she will be on vacation. Didn't get the patient and couldn't leave message because mailbox is full.

## 2024-01-01 ENCOUNTER — Other Ambulatory Visit: Payer: Self-pay

## 2024-01-01 ENCOUNTER — Emergency Department
Admission: EM | Admit: 2024-01-01 | Discharge: 2024-01-01 | Disposition: A | Discharge status: Home / Self Care | Attending: Emergency Medicine | Admitting: Emergency Medicine

## 2024-01-01 ENCOUNTER — Encounter: Payer: Self-pay | Admitting: Emergency Medicine

## 2024-01-01 DIAGNOSIS — R569 Unspecified convulsions: Secondary | ICD-10-CM | POA: Diagnosis not present

## 2024-01-01 DIAGNOSIS — Z3492 Encounter for supervision of normal pregnancy, unspecified, second trimester: Secondary | ICD-10-CM

## 2024-01-01 DIAGNOSIS — O99351 Diseases of the nervous system complicating pregnancy, first trimester: Secondary | ICD-10-CM | POA: Insufficient documentation

## 2024-01-01 DIAGNOSIS — J45909 Unspecified asthma, uncomplicated: Secondary | ICD-10-CM | POA: Diagnosis not present

## 2024-01-01 LAB — CBC WITH DIFFERENTIAL/PLATELET
Abs Immature Granulocytes: 0.03 K/uL (ref 0.00–0.07)
Basophils Absolute: 0 K/uL (ref 0.0–0.1)
Basophils Relative: 0 %
Eosinophils Absolute: 0.1 K/uL (ref 0.0–0.5)
Eosinophils Relative: 1 %
HCT: 27.6 % — ABNORMAL LOW (ref 36.0–46.0)
Hemoglobin: 9.7 g/dL — ABNORMAL LOW (ref 12.0–15.0)
Immature Granulocytes: 0 %
Lymphocytes Relative: 14 %
Lymphs Abs: 1.1 K/uL (ref 0.7–4.0)
MCH: 29.5 pg (ref 26.0–34.0)
MCHC: 35.1 g/dL (ref 30.0–36.0)
MCV: 83.9 fL (ref 80.0–100.0)
Monocytes Absolute: 0.4 K/uL (ref 0.1–1.0)
Monocytes Relative: 6 %
Neutro Abs: 6.2 K/uL (ref 1.7–7.7)
Neutrophils Relative %: 79 %
Platelets: 218 K/uL (ref 150–400)
RBC: 3.29 MIL/uL — ABNORMAL LOW (ref 3.87–5.11)
RDW: 12.8 % (ref 11.5–15.5)
WBC: 7.9 K/uL (ref 4.0–10.5)
nRBC: 0 % (ref 0.0–0.2)

## 2024-01-01 LAB — URINALYSIS, W/ REFLEX TO CULTURE (INFECTION SUSPECTED)
Bilirubin Urine: NEGATIVE
Glucose, UA: NEGATIVE mg/dL
Hgb urine dipstick: NEGATIVE
Ketones, ur: NEGATIVE mg/dL
Leukocytes,Ua: NEGATIVE
Nitrite: NEGATIVE
Protein, ur: 100 mg/dL — AB
Specific Gravity, Urine: 1.018 (ref 1.005–1.030)
pH: 5 (ref 5.0–8.0)

## 2024-01-01 LAB — BASIC METABOLIC PANEL WITH GFR
Anion gap: 8 (ref 5–15)
BUN: 9 mg/dL (ref 6–20)
CO2: 18 mmol/L — ABNORMAL LOW (ref 22–32)
Calcium: 8.4 mg/dL — ABNORMAL LOW (ref 8.9–10.3)
Chloride: 110 mmol/L (ref 98–111)
Creatinine, Ser: 0.5 mg/dL (ref 0.44–1.00)
GFR, Estimated: >60 mL/min (ref >60)
Glucose, Bld: 113 mg/dL — ABNORMAL HIGH (ref 70–99)
Potassium: 3.3 mmol/L — ABNORMAL LOW (ref 3.5–5.1)
Sodium: 136 mmol/L (ref 135–145)

## 2024-01-01 MED ORDER — ACETAMINOPHEN 500 MG PO TABS
1000.0000 mg | ORAL_TABLET | Freq: Once | ORAL | Status: AC
  Administered 2024-01-01: 1000 mg via ORAL
  Filled 2024-01-01: qty 2

## 2024-01-01 MED ORDER — PRENATAL MULTIVITAMIN CH
1.0000 | ORAL_TABLET | Freq: Every day | ORAL | 3 refills | Status: DC
Start: 2024-01-01 — End: 2024-04-24

## 2024-01-01 NOTE — ED Notes (Addendum)
 Pt up to restroom w/out assistance, gait steady. Urine sample collected.

## 2024-01-01 NOTE — ED Provider Notes (Signed)
 [facility] Provider Note    Event Date/Time   First MD Initiated Contact with Patient 01/01/24 2116     (approximate)   History   Chief Complaint: Seizures   HPI  Colleen Pearson is a 33 y.o. female with a history of epilepsy, currently [redacted] weeks pregnant who comes ED due to a seizure.  She reports being compliant on her usual doses of Keppra  and Lamictal  which she has been on long-term.  Last seizure was several months ago.  Denies any recent illness or trauma.  Eating and drinking normally.  She was undergoing a routine 72-hour at home EEG when the seizure occurred.  She reports already being contacted by her neurology provider who will plan to increase her medications after her ED evaluation.  She has an upcoming appointment with [facility] for prenatal care.  Complains of generalized headache      Past Medical History:  Diagnosis Date   Asthma    Cocaine use complicating pregnancy in third trimester 08/30/2017   Endometriosis    Ovarian cyst    Seizures (HCC)    Status post cesarean section 08/30/2017    Current Outpatient Rx   Order #: [id number] Class: No Print   Order #: [id number] Class: No Print   Order #: [id number] Class: Normal   Order #: [id number] Class: Normal   Order #: [id number] Class: Normal   Order #: [id number] Class: Normal   Order #: [id number] Class: Normal    Past Surgical History:  Procedure Laterality Date   CESAREAN SECTION N/A 08/30/2017   Procedure: CESAREAN SECTION;  Surgeon: Leonce Garnette BIRCH, MD;  Location: [facility] ORS;  Service: Obstetrics;  Laterality: N/A;   LASER ABLATION/CAUTERIZATION OF ENDOMETRIAL IMPLANTS      Physical Exam   Triage Vital Signs: ED Triage Vitals  Encounter Vitals Group     BP 01/01/24 2120 (!) 97/50     Girls Systolic BP Percentile --      Girls Diastolic BP Percentile --      Boys Systolic BP Percentile --      Boys Diastolic BP Percentile --      Pulse Rate 01/01/24 2120 82     Resp  01/01/24 2120 16     Temp 01/01/24 2120 97.8 F (36.6 C)     Temp Source 01/01/24 2120 Oral     SpO2 01/01/24 2120 98 %     Weight 01/01/24 2121 151 lb 7.3 oz (68.7 kg)     Height 01/01/24 2121 5' 1 (1.549 m)     Head Circumference --      Peak Flow --      Pain Score 01/01/24 2121 8     Pain Loc --      Pain Education --      Exclude from Growth Chart --     Most recent vital signs: Vitals:   01/01/24 2120  BP: (!) 97/50  Pulse: 82  Resp: 16  Temp: 97.8 F (36.6 C)  SpO2: 98%    General: Awake, no distress.  CV:  Good peripheral perfusion.  Regular rate rhythm Resp:  Normal effort.  Clear to auscultation Abd:  No distention.  Soft, nontender, gravid abdomen Other:  Moist oral mucosa.  Cranial nerves II through XII intact.  Normal mental status   ED Results / Procedures / Treatments   Labs (all labs ordered are listed, but only abnormal results are displayed) Labs Reviewed  BASIC METABOLIC PANEL WITH GFR  CBC WITH DIFFERENTIAL/PLATELET  URINALYSIS,  W/ REFLEX TO CULTURE (INFECTION SUSPECTED)     EKG    RADIOLOGY    PROCEDURES:  Procedures   MEDICATIONS ORDERED IN ED: Medications  acetaminophen  (TYLENOL ) tablet 1,000 mg (1,000 mg Oral Given 01/01/24 2131)     IMPRESSION / MDM / ASSESSMENT AND PLAN / ED COURSE  I reviewed the triage vital signs and the nursing notes.  DDx: Epilepsy, subtherapeutic medication levels, electrolyte abnormality, AKI  Patient's presentation is most consistent with acute presentation with potential threat to life or bodily function.  Patient presents with a seizure, observed on concurrent EEG monitoring.  Currently back to baseline with minimal symptoms.  Will give Tylenol , check labs.  Will encourage prenatal vitamins       FINAL CLINICAL IMPRESSION(S) / ED DIAGNOSES   Final diagnoses:  Seizure (HCC)  Second trimester pregnancy     Rx / DC Orders   ED Discharge Orders     None        Note:  This  document was prepared using Dragon voice recognition software and may include unintentional dictation errors.   Viviann Pastor, MD 01/01/24 2139

## 2024-01-01 NOTE — Discharge Instructions (Signed)
 Buy over-the-counter prenatal vitamins from a grocery or pharmacy and start taking them every day while waiting for your appointment with [facility].

## 2024-01-01 NOTE — ED Triage Notes (Signed)
 Pt arrives via ems after seizure-like activity witnessed by family. Pt has hx of seizures and takes keppra , pt unsure of last seizure. Ems reports pt's neurologist was informed of seizure tonight and plans to follow up w/ pt. Pt arrives w/ eeg monitoring in place. Pt is alert&oriented x4 upon arrival c/o HA 14 weeks gestastion EDD 06/18/24

## 2024-01-04 ENCOUNTER — Encounter: Admitting: Obstetrics

## 2024-01-11 ENCOUNTER — Encounter: Payer: Self-pay | Admitting: Certified Nurse Midwife

## 2024-01-11 ENCOUNTER — Ambulatory Visit: Admitting: Certified Nurse Midwife

## 2024-01-11 ENCOUNTER — Other Ambulatory Visit (HOSPITAL_COMMUNITY)
Admission: RE | Admit: 2024-01-11 | Discharge: 2024-01-11 | Disposition: A | Discharge status: Home / Self Care | Source: Ambulatory Visit | Attending: Certified Nurse Midwife | Admitting: Certified Nurse Midwife

## 2024-01-11 VITALS — BP 110/67 | HR 100 | Wt 143.6 lb

## 2024-01-11 DIAGNOSIS — Z0184 Encounter for antibody response examination: Secondary | ICD-10-CM

## 2024-01-11 DIAGNOSIS — Z114 Encounter for screening for human immunodeficiency virus [HIV]: Secondary | ICD-10-CM

## 2024-01-11 DIAGNOSIS — Z113 Encounter for screening for infections with a predominantly sexual mode of transmission: Secondary | ICD-10-CM

## 2024-01-11 DIAGNOSIS — Z369 Encounter for antenatal screening, unspecified: Secondary | ICD-10-CM

## 2024-01-11 DIAGNOSIS — Z131 Encounter for screening for diabetes mellitus: Secondary | ICD-10-CM

## 2024-01-11 DIAGNOSIS — R569 Unspecified convulsions: Secondary | ICD-10-CM

## 2024-01-11 DIAGNOSIS — Z3482 Encounter for supervision of other normal pregnancy, second trimester: Secondary | ICD-10-CM | POA: Diagnosis not present

## 2024-01-11 DIAGNOSIS — Z3A17 17 weeks gestation of pregnancy: Secondary | ICD-10-CM

## 2024-01-11 DIAGNOSIS — Z3480 Encounter for supervision of other normal pregnancy, unspecified trimester: Secondary | ICD-10-CM

## 2024-01-11 DIAGNOSIS — Z8279 Family history of other congenital malformations, deformations and chromosomal abnormalities: Secondary | ICD-10-CM

## 2024-01-11 DIAGNOSIS — O34219 Maternal care for unspecified type scar from previous cesarean delivery: Secondary | ICD-10-CM | POA: Insufficient documentation

## 2024-01-11 DIAGNOSIS — Z1379 Encounter for other screening for genetic and chromosomal anomalies: Secondary | ICD-10-CM

## 2024-01-11 DIAGNOSIS — Z363 Encounter for antenatal screening for malformations: Secondary | ICD-10-CM

## 2024-01-11 MED ORDER — ASPIRIN 81 MG PO TBEC: 81.0000 mg | DELAYED_RELEASE_TABLET | Freq: Every day | ORAL | 2 refills | Status: DC

## 2024-01-11 MED ORDER — ONDANSETRON 4 MG PO TBDP: 4.0000 mg | ORAL_TABLET | Freq: Three times a day (TID) | ORAL | 1 refills | Status: DC | PRN

## 2024-01-11 MED ORDER — FOLIC ACID 1 MG PO TABS: 1.0000 mg | ORAL_TABLET | Freq: Every day | ORAL | 10 refills | Status: DC

## 2024-01-11 NOTE — Progress Notes (Signed)
 NEW OB HISTORY AND PHYSICAL  SUBJECTIVE:       Colleen Pearson is a 33 y.o. [phone] female, No LMP recorded (lmp unknown). Patient is pregnant., Estimated Date of Delivery: 06/18/24, [redacted]w[redacted]d, presents today for establishment of Prenatal Care. She reports fatigue, breast tenderness, nausea daily & vomiting about 3x/week first thing in the morning-has improved in last few weeks. Unsure desire for TOLAC vs rLTCS. Does not desire future childbearing. Had seizure like activity several weeks ago, but turned out that she was pregnant. Established with [facility].   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, adult education Living situation: her father, daughter, Colleen Pearson-works out of town Work: [employer] Exercise: no regular Substance use: nicotine , marijuana (several weeks ago)  Indications for ASA therapy (per uptodate) One of the following: Previous pregnancy with preeclampsia, especially early onset and with an adverse outcome No Multifetal gestation No Chronic hypertension No Type 1 or 2 diabetes mellitus No Chronic kidney disease No Autoimmune disease (antiphospholipid syndrome, systemic lupus erythematosus) No  Two or more of the following: Nulliparity No Obesity (body mass index >30 kg/m2) No Family history of preeclampsia in mother or sister No Age >=35 years No Sociodemographic characteristics (African American race, low socioeconomic level) Yes Personal risk factors (eg, previous pregnancy with low birth weight or small for gestational age infant, previous adverse pregnancy outcome [eg, stillbirth], interval >10 years between pregnancies) Yes-PPROM, oligohydramnios   Gynecologic History No LMP recorded (lmp unknown). Patient is pregnant. Normal Contraception: none Last Pap: unsure, declines today-desires next visit or postpartum.   Obstetric History OB History  Gravida Para Term Preterm AB Living  4 1 0 1 2 1   SAB IAB Ectopic Multiple Live Births  1 1  0 1    # Outcome Date GA Lbr  Len/2nd Weight Sex Type Anes PTL Lv  4 Current           3 Preterm 08/30/17 [redacted]w[redacted]d  5 lb 6.1 oz (2.44 kg) F CS-LTranv EPI  LIV  2 IAB      TAB     1 SAB             Past Medical History:  Diagnosis Date   Asthma    Cocaine use complicating pregnancy in third trimester 08/30/2017   Endometriosis    Ovarian cyst    Seizures (HCC)    Status post cesarean section 08/30/2017    Past Surgical History:  Procedure Laterality Date   CESAREAN SECTION N/A 08/30/2017   Procedure: CESAREAN SECTION;  Surgeon: Leonce Garnette BIRCH, MD;  Location: [facility] ORS;  Service: Obstetrics;  Laterality: N/A;   LASER ABLATION/CAUTERIZATION OF ENDOMETRIAL IMPLANTS      Current Outpatient Medications on File Prior to Visit  Medication Sig Dispense Refill   lamoTRIgine  (LAMICTAL ) 150 MG tablet Take 1 tablet (150 mg total) by mouth 2 (two) times daily. 60 tablet 0   levETIRAcetam  (KEPPRA ) 1000 MG tablet Take 1 tablet (1,000 mg total) by mouth 2 (two) times daily. 60 tablet 0   Prenatal Vit-Fe Fumarate-FA (PRENATAL MULTIVITAMIN) TABS tablet Take 1 tablet by mouth daily at 12 noon. 90 tablet 3   etonogestrel  (NEXPLANON ) 68 MG IMPL implant 1 each (68 mg total) by Subdermal route once for 1 dose. (Patient not taking: Reported on 01/11/2024) 1 each 0   guaiFENesin -dextromethorphan  (ROBITUSSIN DM) 100-10 MG/5ML syrup Take 10 mLs by mouth every 6 (six) hours as needed for cough. (Patient not taking: Reported on 01/11/2024)     melatonin 5 MG TABS Take 1 tablet (  5 mg total) by mouth at bedtime as needed. (Patient not taking: Reported on 01/11/2024) 30 tablet 0   polyethylene glycol powder (GLYCOLAX /MIRALAX ) 17 GM/SCOOP powder Take 17 g by mouth daily. (Patient not taking: Reported on 01/11/2024) 476 g 0   QUEtiapine  (SEROQUEL ) 50 MG tablet Take 2 tablets (100 mg total) by mouth at bedtime AND 1 tablet (50 mg total) daily at 6 (six) AM. (Patient not taking: No sig reported) 90 tablet 0   No current facility-administered  medications on file prior to visit.    Allergies  Allergen Reactions   Lamotrigine  Hives and Swelling    Pt is currently taking this medication   Penicillins Hives and Swelling    Tolerated Zosyn  02/24/23   Ultram [Tramadol] Hives and Swelling    Social History   Socioeconomic History   Marital status: Single    Spouse name: Not on file   Number of children: 1   Years of education: Not on file   Highest education level: 9th grade  Occupational History   Not on file  Tobacco Use   Smoking status: Every Day    Current packs/day: 0.25    Types: Cigarettes, E-cigarettes   Smokeless tobacco: Never  Vaping Use   Vaping status: Every Day   Substances: Nicotine , Flavoring  Substance and Sexual Activity   Alcohol use: No   Drug use: Not Currently    Types: Marijuana, Cocaine    Comment: smoked marijuana no longer cocaine   Sexual activity: Yes    Birth control/protection: None  Other Topics Concern   Not on file  Social History Narrative   Not on file   Social Drivers of Health   Financial Resource Strain: Medium Risk (12/27/2023)   Overall Financial Resource Strain (CARDIA)    Difficulty of Paying Living Expenses: Somewhat hard  Food Insecurity: No Food Insecurity (12/27/2023)   Hunger Vital Sign    Worried About Running Out of Food in the Last Year: Never true    Ran Out of Food in the Last Year: Never true  Transportation Needs: No Transportation Needs (12/27/2023)   PRAPARE - Administrator, Civil Service (Medical): No    Lack of Transportation (Non-Medical): No  Physical Activity: Inactive (12/27/2023)   Exercise Vital Sign    Days of Exercise per Week: 0 days    Minutes of Exercise per Session: 0 min  Stress: No Stress Concern Present (12/27/2023)   [employer] of Occupational Health - Occupational Stress Questionnaire    Feeling of Stress: Not at all  Social Connections: Unknown (12/27/2023)   Social Connection and Isolation Panel    Frequency of  Communication with Friends and Family: Once a week    Frequency of Social Gatherings with Friends and Family: Once a week    Attends Religious Services: Never    Database administrator or Organizations: No    Attends Banker Meetings: Never    Marital Status: Patient unable to answer  Intimate Partner Violence: Not At Risk (12/27/2023)   Humiliation, Afraid, Rape, and Kick questionnaire    Fear of Current or Ex-Partner: No    Emotionally Abused: No    Physically Abused: No    Sexually Abused: No    Family History  Problem Relation Age of Onset   Down syndrome Sister     The following portions of the patient's history were reviewed and updated as appropriate: allergies, current medications, past OB history, past medical history, past  surgical history, past family history, past social history, and problem list.  Constitutional: Denied constitutional symptoms, night sweats, recent illness, fever, insomnia and weight loss. Endorses fatigue  Eyes: Denied eye symptoms, eye pain, photophobia, vision change and visual disturbance.  Ears/Nose/Throat/Neck: Denied ear, nose, throat or neck symptoms, hearing loss, nasal discharge, sinus congestion and sore throat.  Cardiovascular: Denied cardiovascular symptoms, arrhythmia, chest pain/pressure, edema, exercise intolerance, orthopnea and palpitations.  Respiratory: Denied pulmonary symptoms, asthma, pleuritic pain, productive sputum, cough, dyspnea and wheezing.  Gastrointestinal: Denied gastro-esophageal reflux, melena. Endorses nausea and vomiting.  Genitourinary: Denied genitourinary symptoms including symptomatic vaginal discharge, pelvic relaxation issues, and urinary complaints.  Musculoskeletal: Denied musculoskeletal symptoms, stiffness, swelling, muscle weakness and myalgia.  Dermatologic: Denied dermatology symptoms, rash and scar.  Neurologic: Denied neurology symptoms, dizziness, headache, neck pain and syncope.  Psychiatric:  Denied psychiatric symptoms, anxiety and depression.  Endocrine: Denied endocrine symptoms including hot flashes and night sweats.     OBJECTIVE: Initial Physical Exam (New OB) Physical Exam Vitals reviewed.  Constitutional:      General: She is not in acute distress.    Appearance: Normal appearance.  HENT:     Head: Normocephalic.  Neck:     Thyroid: No thyroid mass or thyromegaly.  Cardiovascular:     Rate and Rhythm: Normal rate and regular rhythm.     Heart sounds: Normal heart sounds.  Pulmonary:     Effort: Pulmonary effort is normal.     Breath sounds: Normal breath sounds.  Chest:  Breasts:    Tanner Score is 5.     Right: No inverted nipple, mass or tenderness.     Left: No inverted nipple, mass or tenderness.  Abdominal:     Palpations: Abdomen is soft.     Tenderness: There is no abdominal tenderness.     Comments: Fundus 2FB below U  Musculoskeletal:     Cervical back: Neck supple. No tenderness.  Skin:    General: Skin is warm and dry.  Neurological:     General: No focal deficit present.     Mental Status: She is alert and oriented to person, place, and time.  Psychiatric:        Mood and Affect: Mood and affect normal.        Behavior: Behavior normal. Behavior is cooperative.     ASSESSMENT: Normal pregnancy   PLAN: Routine prenatal care. We discussed an overview of prenatal care and when to call. Reviewed diet, exercise, and weight gain recommendations in pregnancy. Plans to bottlefeed. I reviewed labs and answered all questions.  Start daily ASA 81mg  Start Folic acid  1mg  daily Zofran  prn, all to pharmacy of choice  1. Supervision of other normal pregnancy, antepartum (Primary)  2. Family history of Down syndrome - US  MFM OB DETAIL +14 WK; Future  3. Seizures (HCC) - US  MFM OB DETAIL +14 WK; Future  4. Antenatal screening encounter - NOB Panel; Future - Culture, OB Urine - Monitor Drug Profile 14(MW) - Nicotine  screen, urine -  Urinalysis, Routine w reflex microscopic - Comprehensive metabolic panel - Hemoglobin A1c - Cervicovaginal ancillary only - AFP, Serum, Open Spina Bifida - NOB Panel  5. Genetic screening - AFP, Serum, Open Spina Bifida  6. Immunity status testing - NOB Panel; Future - NOB Panel  7. Screen for sexually transmitted diseases - NOB Panel; Future - Cervicovaginal ancillary only - NOB Panel  8. Screening for human immunodeficiency virus - NOB Panel; Future - NOB Panel  9.  Screening for diabetes mellitus - Hemoglobin A1c  10. Encounter for supervision of other normal pregnancy in second trimester - PANORAMA PRENATAL TEST - HORIZON Basic Panel  11. Encounter for routine screening for malformation using ultrasonics - US  OB Comp + 14 Wk; Future - US  MFM OB DETAIL +14 WK; Future   Harlene LITTIE Cisco, CNM

## 2024-01-11 NOTE — Patient Instructions (Signed)
 Healthy Weight Gain During Pregnancy Gaining some weight during pregnancy is normal and healthy. The amount of weight you should gain depends on your health and weight before pregnancy. Talk with your health care provider to find out the right amount of weight gain for you. The suggested weight gain is based on your body mass index, or BMI. Here's a general guide based on your weight before pregnancy: If you're underweight or have a BMI less than 18.5, you should gain 28-40 lb (13-18 kg). If you're at a normal weight with a BMI of 18.5-24.9, you should gain 25-35 lb (11-16 kg). If you're overweight with a BMI of 25-29.9, you should gain 15-25 lb (7-11 kg). If you're obese with a BMI of 30 or higher, you should gain 11-20 lb (5-9 kg). Your provider may suggest that you try to gain weight slower or gain more weight based on what's best for your baby and your health. What are the risks of unhealthy weight gain for me? Gaining too much weight during pregnancy can lead to: A type of diabetes that can happen during pregnancy. This is called gestational diabetes. Hypertensive disorders of pregnancy. These are problems that cause high blood pressure. Having a difficult delivery or a C-section. What are the risks of unhealthy weight gain for my baby? Gaining too little weight during pregnancy can lead to: Loss of the pregnancy. An early (preterm) birth. Your baby not growing normally. Your baby having a low weight at birth. Gaining too much weight during pregnancy can lead to: Your baby growing larger than normal during pregnancy. Your baby having an increased risk of obesity. What actions can I take to gain a healthy amount of weight during pregnancy? Nutrition  Eat healthy foods. Every day, try to eat: Fruits and vegetables of various colors and kinds. Whole grains, like whole-wheat breads and oatmeal. Low-fat dairy foods such as yogurt, milk, and cheese. Or try non-dairy alternatives from soy  or almond. Protein-rich foods, like lean meat, chicken, eggs, and beans. Avoid foods that are fried or have a lot of fat, salt, or sugar. Drink more fluids as told. Choose healthy snacks and drinks: Drink water . Avoid soda, sports drinks, and juices that have added sugar. Avoid drinks with caffeine, such as coffee and energy drinks. Eat snacks that are high in protein, such as nuts, protein bars, and low-fat yogurt. Carry snacks with you that don't need refrigeration, such as trail mix, an apple, or a bar. If you need help improving your diet, work with your provider or an expert in healthy eating called a dietitian. Activity  Exercise regularly, as told by your provider. If you were active before you became pregnant, you may be able to continue your regular fitness activities. If you were not active before pregnancy, you may slowly build up to exercising for 30 or more minutes on most days of the week. This may include walking, swimming, or yoga. Ask your provider what activities are safe for you. Follow these instructions at home: Take medicines only as told. Take all prenatal supplements as told. Keep track of your weight gain during pregnancy. Keep all prenatal health care visits. These visits are a good time to discuss your weight gain. Your provider will check on your health and the health of your baby. Where to find support If you have questions or need help learning about healthy weight gain during pregnancy, these people may help: Your health care provider. A dietitian. Where to find more information To learn  more: Go to bitchilla.com. Click Search and type weight gain. Find the link you need. Contact a health care provider if: You're not able to eat or drink for longer than 24 hours. You can't afford food or have trouble getting regular meals. This information is not intended to replace advice given to you by your health care provider. Make sure you discuss any  questions you have with your health care provider. Document Revised: 04/30/2023 Document Reviewed: 04/30/2023 Elsevier Patient Education  2025 [employer]. Genetic Testing During Pregnancy: What to Know Genetic testing is done when you're pregnant to check if your baby might have a congenital condition. A congenital condition is something a baby is born with, also called a birth condition. These conditions can happen when genes or chromosomes are not normal. Genes are tiny parts in your body that make up chromosomes. Chromosomes are groups of many genes. Together, they tell your body how to look and work. Genes are passed down from parents to their baby. Why is genetic testing done during pregnancy? Genetic testing allows you to: Talk about your test results and future plans with your health care team. Plan for a baby that may be born with a congenital condition. Make plans with your health care team in case your baby needs special care before or after birth. Think about your options regarding whether you want to continue with the pregnancy. Types of genetic tests A genetic test can be a screening test or a diagnostic test. Screening tests     Screening tests are used to check the risk of your baby having a congenital condition. They don't show if your baby actually has the condition. More testing will be needed to know for sure. Screening tests are recommended for all pregnant people. Screening tests will not hurt your baby. Types of screening tests include: Carrier screening. The parents' blood or saliva is tested to check for genes that aren't normal. These genes can be passed to the baby. If both parents have the gene, the baby is at risk. First-trimester screening. This includes a maternal blood test and an ultrasound of your baby. This test checks for a risk of conditions related to chromosomes. It also looks for problems with your baby's heart, belly, or bones. Second-trimester  screening. This may include a maternal blood test and an ultrasound of your baby. This test checks for the risk of conditions related to chromosomes. It also looks for problems with many parts of your baby's body. These include the brain, nose, mouth, spine, heart, and arms or legs. Some people may only have an ultrasound and not have a blood test. Combined or sequential screening. This looks at the results from the blood tests in the first and second trimesters, along with the findings of the first-trimester ultrasound. It helps to tell you more about your baby. This type of testing may be more accurate than just doing screening in the first or second trimester by itself. Cell-free DNA testing. During pregnancy, cells from your placenta get into your blood, which is normal. This test is a blood test that looks at those cells. It's done after 10 weeks of pregnancy. It can be used to check for the risk of conditions caused by having too many chromosomes or an abnormal number of sex chromosomes.  Diagnostic tests Diagnostic tests are done only if your baby is known to be at risk of having a congenital condition. These tests check the cells from your baby to diagnose a condition.  Examples of these tests include: Chorionic villus sampling (CVS). This is a procedure where cells are taken from the placenta for testing. To do this, a needle is put into your belly using guided ultrasound. Amniocentesis. This is a procedure where amniotic fluid is removed from the sac around your baby. The cells from the placenta or amniotic fluid are tested for chromosomes that are not normal. What do the results mean? For a screening test: If your results are negative, it means that your baby is most likely not at a higher risk for a condition. There's still a small chance your baby could have a condition. If your results are positive, it means that your baby's risk for a condition is higher than normal. Your health care  provider may want you to have a diagnostic test. For a diagnostic test: If the result is negative, it's not likely that your baby will have a condition. If the test is positive, your baby most likely has a condition. Talk with your provider about what your results mean and what your options are. Questions to ask your health care provider Talk with your provider about the conditions that run in your family. Ask these questions: Is my baby at risk for a congenital condition? What are the benefits of having genetic screening? Should I meet with a genetic counselor? Should my partner or other members of my family be tested? What tests are best for me and my baby? How much do the tests cost? Will my insurance cover the testing? What are the risks of each test? This information is not intended to replace advice given to you by your health care provider. Make sure you discuss any questions you have with your health care provider. Document Revised: 04/29/2023 Document Reviewed: 04/29/2023 Elsevier Patient Education  2025 [employer]. Pregnancy: Healthy Eating While you're pregnant, your body needs extra nutrition for your growing baby. You also need more vitamins and minerals, such as folic acid , calcium , iron, and vitamin D. Eating a balanced diet is important for both you and your baby. Your need for extra calories will change during pregnancy. During the first 3 months of pregnancy, called the first trimester, you don't need more calories. During the second trimester, you'll need about 340 extra calories a day. During the third trimester, you'll need about 450 extra calories a day. If you're carrying more than one baby, talk with your health care provider or a dietitian to learn more about your specific eating needs. What are tips for eating healthy during pregnancy? Meal planning  Eating smaller meals throughout the day may help manage some side effects common in pregnancy, like heartburn and  reflux. Eat a variety of foods. Be sure to include many types of fruits and vegetables. Two or more servings of fish are recommended each week. Choose fish that are lower in mercury, such as salmon and pollock. Limit foods that have empty calories. These are foods that have little nutritional value, such as sweets, desserts, candies, and drinks with sugar in them. Drinks that have caffeine are OK to drink, but it's better to avoid caffeine. Limit your total caffeine intake to less than 200 mg each day, or the limit you're told by your provider. Be aware that 200 mg of caffeine is 12 oz or 355 mL of coffee, tea, or soda. General information Take a prenatal vitamin to help meet your vitamin and mineral needs during pregnancy. This includes your need for folic acid , iron, calcium , and vitamin D.  Do not try to lose weight or go on a diet during pregnancy. Food safety  Wash your hands before you eat and after you prepare raw meat. Wash all fruits and vegetables well before peeling or eating. Make sure that all meats, poultry, and eggs are cooked to food-safe temperatures or well-done. Taking these actions can help keep your food safe and protect you and your baby from dangerous food illnesses. Ask your provider for more information. What foods should I eat? Fruits All fruits. Eat a variety of colors and types of fruit. Remember to wash your fruits well before peeling or eating. Vegetables All vegetables. Eat a variety of colors and types of vegetables. Remember to wash your vegetables well before peeling or eating. Grains All grains. Choose whole grains, such as whole-wheat bread, oatmeal, or brown rice. Meats and other protein foods Lean meats, including chicken, [place], and lean cuts of beef, veal, or pork. Fish that is higher in omega-3 fatty acids and lower in mercury, such as salmon, herring, mussels, trout, sardines, pollock, shrimp, crab, and lobster. Tofu. Tempeh. Beans. Eggs. Peanut  butter and other nut butters. Dairy Pasteurized milk and milk alternatives, such as soy milk. Pasteurized yogurt and pasteurized cheese. Cottage cheese. Sour cream. Beverages Water . Juices that contain 100% fruit juice or vegetable juice. Caffeine-free teas and decaffeinated coffee. Fats and oils Fats and oils are OK to include in moderation. Sweets and desserts Sweets and desserts are OK to include in moderation. Seasoning and other foods All pasteurized condiments. The items listed above may not be all the foods and drinks you can have. Talk with a dietitian to learn more. What does 340 extra calories look like? Healthy snacks that give you 340 more calories a day could be: Peanut butter and jelly with milk: 8 oz (237 mL) of low-fat milk. Peanut butter and jelly sandwich made with: 1 slice of whole-wheat bread. 2 teaspoons (10 g) of peanut butter. Yogurt and berries: 1 cup (245 g) of [place] yogurt. 1 cup (150 g) of berries. 2 tablespoons (30 g) of chopped nuts, such as almonds or walnuts. Avocado toast: 1 slice of whole-wheat bread. 1/2 medium avocado (70 g). 1 large egg (50 g). What foods should I avoid? Fruits Raw (unpasteurized) fruit juices. Vegetables Unpasteurized vegetable juices. Meats and other protein foods Precooked or cured meat, such as bologna, hot dogs, sausages, or meat loaves. (If you must eat those meats, reheat them until they are steaming hot.) Refrigerated pate, meat spreads from a meat counter, or smoked seafood that's found in the refrigerated section of a store. Raw or undercooked meats, poultry, and eggs. Raw fish, such as sushi or sashimi. Fish that have high mercury content, such as tilefish, shark, swordfish, and king mackerel. Dairy Unpasteurized or raw milk and any foods that are made from them. Some of these may be: Homemade yogurts or puddings. Soft cheeses such as: Feta. Queso blanco or fresco. Pharmacist, hospital or [place]. Blue-veined  cheeses. Some of these types of cheeses may be made with pasteurized milk. Check the label. If pasteurized milk is used, they are OK to eat during pregnancy. Deli foods Premade foods from a store or deli, like chicken salad, coleslaw, or egg salad. These are riskier for food illness than fresh or homemade salads. Beverages Alcohol. Sugar-sweetened drinks, such as sodas or teas. Energy drinks. Seasoning and other foods Homemade fermented foods and drinks, such as: Pickles. Sauerkraut. Kombucha. Store-bought pasteurized versions of these are OK. The items listed above may not  be all the foods and drinks you should avoid. Talk with a dietitian to learn more. Where to find more information To learn more, go to: Centers for Disease Control and Prevention at TonerPromos.no. Click Search and type food choices for pregnancy. Find the link you need. MyPlate at http://pittman-dennis.biz/. This information is not intended to replace advice given to you by your health care provider. Make sure you discuss any questions you have with your health care provider. Document Revised: 05/26/2023 Document Reviewed: 05/26/2023 Elsevier Patient Education  2025 [employer]. Second Trimester of Pregnancy  The second trimester of pregnancy is from week 14 through week 27. This is months 4 through 6 of pregnancy. During the second trimester: Morning sickness is less or has stopped. You may have more energy. You may feel hungry more often. At this time, your unborn baby is growing very fast. At the end of the sixth month, the unborn baby may be up to 12 inches long and weigh about 1 pounds. You will likely start to feel the baby move between 16 and 20 weeks of pregnancy. Body changes during your second trimester Your body continues to change during this time. The changes usually go away after your baby is born. Physical changes You will gain more weight. Your belly will get bigger. You may begin to get stretch  marks on your hips, belly, and breasts. Your breasts will keep growing and may hurt. You may get dark spots or blotches on your face. A dark line from your belly button to the pubic area may appear. This line is called linea nigra. Your hair may grow faster and get thicker. Health changes You may have headaches. You may have heartburn. You may pee more often. You may have swollen, bulging veins (varicose veins). You may have trouble pooping (constipation), or swollen veins in the butt that can itch or get painful (hemorrhoids). You may have back pain. This is caused by: Weight gain. Pregnancy hormones that are relaxing the joints in your pelvis. Follow these instructions at home: Medicines Talk to your health care provider if you're taking medicines. Ask if the medicines are safe to take during pregnancy. Your provider may change the medicines that you take. Do not take any medicines unless told to by your provider. Take a prenatal vitamin that has at least 600 micrograms (mcg) of folic acid . Do not use herbal medicines, illegal drugs, or medicines that are not approved by your provider. Eating and drinking While you're pregnant your body needs extra food for your growing baby. Talk with your provider about what to eat while pregnant. Activity Most women are able to exercise during pregnancy. Exercises may need to change as your pregnancy goes on. Talk to your provider about your activities and exercise routines. Relieving pain and discomfort Wear a good, supportive bra if your breasts hurt. Rest with your legs raised if you have leg cramps or low back pain. Take warm sitz baths to soothe pain from hemorrhoids. Use hemorrhoid cream if your provider says it's okay. Do not douche. Do not use tampons or scented pads. Do not use hot tubs, steam rooms, or saunas. Safety Wear your seatbelt at all times when you're in a car. Talk to your provider if someone hits you, hurts you, or yells  at you. Talk with your provider if you're feeling sad or have thoughts of hurting yourself. Lifestyle Certain things can be harmful while you're pregnant. It's best to avoid the following: Do not drink alcohol,smoke, vape,  or use products with nicotine  or tobacco in them. If you need help quitting, talk with your provider. Avoid cat litter boxes and soil used by cats. These things carry germs that can cause harm to your pregnancy and your baby. General instructions Keep all follow-up visits. It helps you and your unborn baby stay as healthy as possible. Write down your questions. Take them to your prenatal visits. Your provider will: Talk with you about your overall health. Give you advice or refer you to specialists who can help with different needs, including: Prenatal education classes. Mental health and counseling. Foods and healthy eating. Ask for help if you need help with food. Where to find more information American Pregnancy Association: americanpregnancy.org [employer]ians and Gynecologists: acog.org Office on [employer] Health: TravelLesson.ca Contact a health care provider if: You have a headache that does not go away when you take medicine. You have any of these problems: You can't eat or drink. You throw up or feel like you may throw up. You have watery poop (diarrhea) for 2 days or more. You have pain when you pee or your pee smells bad. You have been sick for 2 days or more and are not getting better. Contact your provider right away if: You have any of these coming from your vagina: Abnormal discharge. Bad-smelling fluid. Bleeding. Your baby is moving less than usual. You have contractions, belly cramping, or have pain in your pelvis or lower back. You have symptoms of high blood pressure or preeclampsia. These include: A severe, throbbing headache that does not go away. Sudden or extreme swelling of your face, hands, legs, or feet. Vision  problems: You see spots. You have blurry vision. Your eyes are sensitive to light. If you can't reach the provider, go to an urgent care or emergency room. Get help right away if: You faint, become confused, or can't think clearly. You have chest pain or trouble breathing. You have any kind of injury, such as from a fall or a car crash. These symptoms may be an emergency. Call 911 right away. Do not wait to see if the symptoms will go away. Do not drive yourself to the hospital. This information is not intended to replace advice given to you by your health care provider. Make sure you discuss any questions you have with your health care provider. Document Revised: 03/11/2023 Document Reviewed: 10/09/2022 Elsevier Patient Education  2024 [employer]. The Dangers of Smoking While Pregnant  Smoking while you're pregnant isn't good for you and your baby. The smoke from cigarettes, vapes, pipes, and cigars has chemicals in it that can cause cancer. The smoke also has a drug in it called nicotine . When you smoke, the chemicals and nicotine  go into your body and can be passed on to your baby. This can affect your baby's growth. If you're pregnant or plan to become pregnant, talk with your health care provider about how to stop smoking. You have a much better chance of having a healthy pregnancy and a healthy baby if you don't smoke while pregnant. How does smoking affect me? Smoking makes you more likely to get many long-term diseases. These include: Cancer. Lung diseases. Heart disease. Smoking while you're pregnant can make you more likely to: Lose the pregnancy. Have a pregnancy that's outside of the uterus. This is called a tubal pregnancy. Give birth too early. Have your water  break early. Have problems with the placenta, which is the organ that gives your baby nutrients and oxygen.  How does smoking affect my baby? Before birth If you smoke while you're pregnant, it can: Lessen  blood flow and oxygen to your baby. Affect your baby's brain and lungs. Slow your baby's growth. After birth Babies exposed to smoke during pregnancy: May show signs of nicotine  withdrawal. May be born with a cleft lip or cleft palate. May be born too small. May have a high risk of: Serious and lifelong health problems. Sudden infant death syndrome (SIDS). What can I do to lower my risk of problems from smoking? Do not smoke, vape, or use nicotine  or tobacco. Do not take nicotine  supplements or medicine to help you quit smoking unless your provider tells you to. Stay away when people are smoking. Ask people who smoke to avoid smoking around you. Talk with your provider about ways to help you quit smoking. You may want to try: Counseling. Psychotherapy. Acupuncture. Hypnosis. Phone hotlines for people trying to quit. Where to find more information To learn more about smoking while pregnant and how to quit, go to: March of Dimes: marchofdimes.org U.S. Department of Health and Human Services [facility]): women.smokefree.gov American Cancer Society (ACS): cancer.org For help to quit smoking, call the national quitline at: 1-800-QUIT-NOW 587-644-4147). Contact a health care provider if: You're having trouble quitting smoking. You smoke and are pregnant or plan to become pregnant. You start smoking again after you give birth. This information is not intended to replace advice given to you by your health care provider. Make sure you discuss any questions you have with your health care provider. Document Revised: 10/28/2022 Document Reviewed: 10/28/2022 Elsevier Patient Education  2024 [employer] Problems to Watch for During Pregnancy During pregnancy, your body goes through many changes. Some changes may be uncomfortable. But most changes are not a serious problem. It's important to learn when certain signs and symptoms may be a problem. Talk with your health care provider about any medical  conditions you have. Make sure you know the symptoms to watch for. Reporting problems early will prevent complications. Problems to watch for during pregnancy You're more likely to get an infection during pregnancy. Let your provider know if you have signs of infection, such as: A fever. A bad-smelling fluid from your vagina. Peeing too often, wanting to pee urgently, or pain when you pee. Also, let your provider know if: You're very tired, you feel dizzy, or you faint. You have watery poop (diarrhea) for 24 hours or longer. You throw up or feel like throwing up for 24 hours or longer. You have cramping in your belly or have pain in your hips or lower back. You have spotting, bleeding, or leaking of fluid from your vagina. You have pain, swelling, or redness in an arm or leg. You should also watch for signs of high blood pressure and preeclampsia. These signs can be very serious. They include: A headache that doesn't go away when you take medicine. Sudden or very bad swelling of your face, hands, legs, or feet. Problems seeing, such as: You see spots. You have blurry vision. You may be sensitive to light. Why it's important to watch for these problems Watching and reporting problems to your provider can help prevent complications that may affect you and your baby. These include: Higher risk of giving birth early. Infection that may be passed on to your baby. Higher risk for stillbirth. Follow these instructions at home:  Take your medicines only as told. Keep all follow-up visits. Your provider needs to monitor your health and  your baby's health. Where to find more information To learn more, go to these websites: Centers for Disease Control and Prevention (CDC) at TonerPromos.no. Then: Click Health Topics A-Z. Type urgent maternal warning signs in the search box. [employer] of [employer]ians and Gynecologists (ACOG): acog.org Contact a health care provider if: You have any  problems while you're pregnant. You feel your baby moving less than usual. You have any of these things: You have strong emotions, such as sadness or anxiety, that affect your daily life. You do not feel safe in your home. You use tobacco, alcohol, or drugs, and you need help to stop. Get help right away if: You faint, have a seizure, or cannot think clearly. You have chest pain or difficulty breathing. You have any of the following symptoms and you were unable to reach your provider: You have symptoms of infection, including a fever, or have vaginal bleeding. You have symptoms of high blood pressure or preeclampsia. You have signs or symptoms of labor before 37 weeks of pregnancy. These include: Contractions that are 5 minutes or less apart, or that increase in frequency, intensity, or length. Sudden, sharp pain in the belly, or low back pain. Any amount of fluid that flows from your vagina without stopping. These symptoms may be an emergency. Call 911 right away. Do not wait to see if the symptoms will go away. Do not drive yourself to the hospital. This information is not intended to replace advice given to you by your health care provider. Make sure you discuss any questions you have with your health care provider. Document Revised: 11/17/2022 Document Reviewed: 11/17/2022 Elsevier Patient Education  2024 [employer]. Birth Options After a C-section: What to Expect If you have delivered a baby by C-section, also called cesarean birth, you may be able to deliver your next baby vaginally. This is called vaginal birth after cesarean (VBAC). VBAC may be a safe option for you depending on your health and other factors. You'll need to talk to your health care provider to see if you can have a VBAC. Options for future births After a C-section, your options for any future births may include: A scheduled C-section. A trial of labor to try to have a vaginal birth. Keep these things in  mind: A successful trial of labor ends with a vaginal birth. You may still need a C-section if the trial of labor is not successful. A trial of labor is safest when done in a facility where a C-section is also possible. What are the benefits? The benefits of delivering your baby vaginally include: No need for surgery. A shorter hospital stay. A faster recovery time. A lower risk for infection. Less blood loss. What are the risks? Your provider will talk with you about risks. Risks of labor after a previous C-section may include: You may still need a C-section. Infection, injury, or blood loss. Tearing of the past C-section scar, also called uterine rupture. This is rare but can be very harmful to you and your baby. If a repeat C-section is needed, the risks may include: Infection, blood loss, or blood clot. Injury to organs in your belly. Needing to remove the uterus if there's a problem. This is also called a hysterectomy. Placenta problems in future pregnancies. How do I know if VBAC is right for me? You have a C-section scar that is low in the bikini area and goes side to side. This is also called a low transverse scar. You have not had  more than two C-sections with bikini line cuts, also known as low transverse cuts. You have no other scars on your uterus, past ruptures, or problems with your uterus. You have had a vaginal birth before. You and your baby are healthy. Your labor starts on its own just before or on your due date. When is VBAC not an option? Your health care team may not recommend VBAC if: You've had more than two C-sections. You have had a torn uterus or a big surgery on your uterus. The scar on your uterus from a previous C-section makes it unsafe to deliver vaginally, such as one that goes up and down, also called a vertical scar. What else should I know about my options? Delivering a baby through a VBAC is similar to having a normal spontaneous vaginal  delivery. The following are safe with a VBAC: Delivering twins. Turning the baby from a breech position to head down position (external cephalic version). Epidurals. VBAC should be done in facilities where an emergency C-section can be done. VBAC is not recommended for home births. Changes in your health or your baby's health during your pregnancy or labor may stop you from having a VBAC. Questions to ask your health care provider Is a trial of labor right for me? What are my chances of a successful vaginal birth? Is my preferred birth location safe for a trial of labor? Where to find more information To learn more, go to these websites. [employer]ians and Gynecologists: acog.org American Pregnancy Association: americanpregnancy.org Click Search and type VBAC. Find the link you need. This information is not intended to replace advice given to you by your health care provider. Make sure you discuss any questions you have with your health care provider. Document Revised: 03/23/2023 Document Reviewed: 03/23/2023 Elsevier Patient Education  2025 [employer]. Early Labor (Pre-Term Labor): What to Know Pregnancy normally lasts 39-41 weeks. Pre-term labor is when labor starts before you have been pregnant for 37 weeks. Babies who are born too early may be at an increased risk for long-term problems like cerebral palsy, developmental delays, and vision and hearing problems. Premature babies may also have problems soon after birth, such as problems with their blood sugar, body temperature, heart, and breathing. These babies often have trouble with feeding. These problems may be very serious in babies who are born before 34 weeks of pregnancy. What are the causes? The cause of pre-term labor is not known. What increases the risk? You're more likely to have pre-term labor if: You have medical problems, now or in the past. You have problems now or in your past  pregnancies. You have risk factors related to lifestyle, environment, and age. Medical history You have problems affecting your uterus, including a short cervix. You have a sexually transmitted infection (STI) or other infections of the urinary tract and the vagina. You have: Blood clotting problems. High blood pressure. High blood sugar. You have a low body weight or too much body weight. Present and past pregnancies You have had pre-term labor before. You're pregnant with more than one baby. The placenta covers your cervix,. This is called placenta previa. Your unborn baby has a congenital condition. This means your baby will be born with the condition. You have bleeding from your vagina while you're pregnant. You became pregnant through in vitro fertilization (IVF). Lifestyle and environmental factors You use drugs, tobacco products or drink alcohol. You have stress and no social support. You experience domestic violence. You're exposed  to certain chemicals at home or work. Other factors You're younger than age 63 or older than age 98. What are the signs or symptoms?  Cramps like those that can happen during a menstrual period. The cramps may happen with diarrhea, which is watery poop. Pain your belly or lower back. Regular contractions. It may feel like your belly is getting tighter. Pressure in your pelvis. Increased watery or bloody discharge from your vagina. Your water  breaking. How is this diagnosed? This condition is diagnosed based on: Your medical history and a physical exam. A pelvic exam. An ultrasound. Monitoring for contractions. Other tests, including: A swab of the cervix to check for a protein substance called fetal fibronectin. This protein is usually present in the area between the uterus and the amniotic sac early in pregnancy and then again towards the end. If it's found in the middle of pregnancy, it can sometimes be a sign of pre-term labor. Urine  tests. How is this treated? Treatment depends on how far along your pregnancy is, the health of your baby, and your health. Treatment may include: Taking medicines to: Stop contractions. Help the baby's lungs mature if the risk of early delivery is high. Medicines to help prevent your baby from having cerebral palsy or other problems. Bed rest. If the labor happens before 34 weeks of pregnancy, you may need to stay in the hospital. Delivery of the baby. Follow these instructions at home: Do not smoke, vape, or use nicotine  or tobacco. Do not drink alcohol. Take your medicines only as told. Rest as told by your provider. Ask what things are safe for you to do at home. Ask when you can go back to work or school. Keep all follow-up visits. Your provider will need to closely follow your health and the health of your baby. How is this prevented? To increase your chance of having a full-term pregnancy: Do not use drugs or take medicines that have not been prescribed to you during your pregnancy. Talk with your provider before taking any herbal supplements, even if you have been taking them regularly. Gain a healthy amount of weight during your pregnancy. Watch for infection. If you think that you might have an infection, get it checked right away. Symptoms of infection may include: Fever. Discharge from your vagina that smells bad or is not normal. Pain or burning when you pee. Having to pee small amounts or very often. Blood in your pee. Where to find more information To learn more, go to these websites: U.S. Department of Health and Cytogeneticist on Women's Health: http://hoffman.com/ [employer]ians [employer] Gynecologists: www.acog.org Centers for Disease Control and Prevention at DiningCalendar.de. Then: Click Search and type preterm labor. Find the link you need. Contact a health care provider if: You think you're going into pre-term labor. You have signs or  symptoms of pre-term labor. You have symptoms of infection. Get help right away if: You're having regular, painful contractions every 5 minutes or less. Your water  breaks. This information is not intended to replace advice given to you by your health care provider. Make sure you discuss any questions you have with your health care provider. Document Revised: 12/17/2022 Document Reviewed: 12/17/2022 Elsevier Patient Education  2024 [employer].

## 2024-01-12 LAB — CERVICOVAGINAL ANCILLARY ONLY
Comment: NEGATIVE
Comment: NORMAL

## 2024-01-13 LAB — CBC/D/PLT+RPR+RH+ABO+RUBIGG...
Antibody Screen: NEGATIVE
Basophils Absolute: 0 x10E3/uL (ref 0.0–0.2)
Basos: 0 %
EOS (ABSOLUTE): 0.1 x10E3/uL (ref 0.0–0.4)
Eos: 2 %
HCV Ab: NONREACTIVE
HIV Screen 4th Generation wRfx: NONREACTIVE
Hematocrit: 35.1 % (ref 34.0–46.6)
Hemoglobin: 11.6 g/dL (ref 11.1–15.9)
Hepatitis B Surface Ag: NEGATIVE
Immature Grans (Abs): 0 x10E3/uL (ref 0.0–0.1)
Immature Granulocytes: 0 %
Lymphocytes Absolute: 1.2 x10E3/uL (ref 0.7–3.1)
Lymphs: 19 %
MCH: 29.5 pg (ref 26.6–33.0)
MCHC: 33 g/dL (ref 31.5–35.7)
MCV: 89 fL (ref 79–97)
Monocytes Absolute: 0.4 x10E3/uL (ref 0.1–0.9)
Monocytes: 6 %
Neutrophils Absolute: 4.7 x10E3/uL (ref 1.4–7.0)
Neutrophils: 72 %
Platelets: 277 x10E3/uL (ref 150–450)
RBC: 3.93 x10E6/uL (ref 3.77–5.28)
RDW: 13.2 % (ref 11.7–15.4)
RPR Ser Ql: NONREACTIVE
Rh Factor: POSITIVE
Rubella Antibodies, IGG: <0.9 {index} — ABNORMAL LOW (ref >0.99)
Varicella zoster IgG: REACTIVE
WBC: 6.5 x10E3/uL (ref 3.4–10.8)

## 2024-01-13 LAB — AFP, SERUM, OPEN SPINA BIFIDA
AFP MoM: 1.49
AFP Value: 60.6 ng/mL
Gest. Age on Collection Date: 17.3 wk
Maternal Age At EDD: 33.4 a
OSBR Risk 1 IN: 2809
Test Results:: NEGATIVE
Weight: 143 [lb_av]

## 2024-01-13 LAB — COMPREHENSIVE METABOLIC PANEL WITH GFR
ALT: 10 IU/L (ref 0–32)
AST: 15 IU/L (ref 0–40)
Albumin: 3.7 g/dL — ABNORMAL LOW (ref 3.9–4.9)
Alkaline Phosphatase: 79 IU/L (ref 44–121)
BUN/Creatinine Ratio: 11 (ref 9–23)
BUN: 8 mg/dL (ref 6–20)
Bilirubin Total: <0.2 mg/dL (ref 0.0–1.2)
CO2: 19 mmol/L — ABNORMAL LOW (ref 20–29)
Calcium: 9.2 mg/dL (ref 8.7–10.2)
Chloride: 102 mmol/L (ref 96–106)
Creatinine, Ser: 0.71 mg/dL (ref 0.57–1.00)
Globulin, Total: 2.7 g/dL (ref 1.5–4.5)
Glucose: 74 mg/dL (ref 70–99)
Potassium: 4.7 mmol/L (ref 3.5–5.2)
Sodium: 136 mmol/L (ref 134–144)
Total Protein: 6.4 g/dL (ref 6.0–8.5)
eGFR: 115 mL/min/1.73 (ref >59)

## 2024-01-13 LAB — HCV INTERPRETATION

## 2024-01-13 LAB — HEMOGLOBIN A1C
Est. average glucose Bld gHb Est-mCnc: 91 mg/dL
Hgb A1c MFr Bld: 4.8 % (ref 4.8–5.6)

## 2024-01-16 LAB — PANORAMA PRENATAL TEST FULL PANEL:PANORAMA TEST PLUS 5 ADDITIONAL MICRODELETIONS: FETAL FRACTION: 14.1

## 2024-01-17 ENCOUNTER — Ambulatory Visit: Payer: Self-pay | Admitting: Certified Nurse Midwife

## 2024-01-17 DIAGNOSIS — O09893 Supervision of other high risk pregnancies, third trimester: Secondary | ICD-10-CM

## 2024-01-21 LAB — HORIZON CUSTOM: REPORT SUMMARY: POSITIVE — AB

## 2024-01-23 DIAGNOSIS — Z141 Cystic fibrosis carrier: Secondary | ICD-10-CM | POA: Insufficient documentation

## 2024-01-23 DIAGNOSIS — O09893 Supervision of other high risk pregnancies, third trimester: Secondary | ICD-10-CM | POA: Insufficient documentation

## 2024-01-31 ENCOUNTER — Observation Stay
Admission: EM | Admit: 2024-01-31 | Discharge: 2024-01-31 | Disposition: A | Discharge status: Home / Self Care | Attending: Obstetrics | Admitting: Obstetrics

## 2024-01-31 ENCOUNTER — Encounter: Payer: Self-pay | Admitting: Family

## 2024-01-31 ENCOUNTER — Other Ambulatory Visit: Payer: Self-pay

## 2024-01-31 ENCOUNTER — Observation Stay

## 2024-01-31 DIAGNOSIS — R109 Unspecified abdominal pain: Secondary | ICD-10-CM | POA: Diagnosis not present

## 2024-01-31 DIAGNOSIS — O09893 Supervision of other high risk pregnancies, third trimester: Secondary | ICD-10-CM

## 2024-01-31 DIAGNOSIS — O99332 Smoking (tobacco) complicating pregnancy, second trimester: Secondary | ICD-10-CM | POA: Insufficient documentation

## 2024-01-31 DIAGNOSIS — O26899 Other specified pregnancy related conditions, unspecified trimester: Secondary | ICD-10-CM | POA: Diagnosis present

## 2024-01-31 DIAGNOSIS — Z3A20 20 weeks gestation of pregnancy: Secondary | ICD-10-CM | POA: Diagnosis not present

## 2024-01-31 DIAGNOSIS — O34219 Maternal care for unspecified type scar from previous cesarean delivery: Principal | ICD-10-CM

## 2024-01-31 DIAGNOSIS — O26892 Other specified pregnancy related conditions, second trimester: Secondary | ICD-10-CM | POA: Diagnosis present

## 2024-01-31 DIAGNOSIS — O99512 Diseases of the respiratory system complicating pregnancy, second trimester: Secondary | ICD-10-CM | POA: Insufficient documentation

## 2024-01-31 DIAGNOSIS — R102 Pelvic and perineal pain: Secondary | ICD-10-CM | POA: Insufficient documentation

## 2024-01-31 DIAGNOSIS — J45909 Unspecified asthma, uncomplicated: Secondary | ICD-10-CM | POA: Insufficient documentation

## 2024-01-31 DIAGNOSIS — Z7982 Long term (current) use of aspirin: Secondary | ICD-10-CM | POA: Insufficient documentation

## 2024-01-31 DIAGNOSIS — F17290 Nicotine dependence, other tobacco product, uncomplicated: Secondary | ICD-10-CM | POA: Diagnosis not present

## 2024-01-31 NOTE — OB Triage Note (Signed)
 Pt discharged home per order.  Pt stable and ambulatory and an After Visit Summary was printed and given to the patient. Discharge education completed with patient/family including follow up instructions, appointments, and medication list. Pt received labor and bleeding precautions. Patient able to verbalize understanding, all questions fully answered upon discharge. Patient instructed to return to ED, call 911, or call MD for any changes in condition. Pt discharged home via personal vehicle with support person.

## 2024-01-31 NOTE — OB Triage Note (Signed)
 Pt reports to labor and delivery from EMS. She has been having some abdominal pain for the last 3 days. This morning around 6am she was woken by pelvic pain that felt like something ripped. She feels a lot of pressure and pain. She rates the pain as 7/10.   She has been throwing up since she found out she was pregnant. Morning sickness.   Doppler FHT is 140s.   Pt denies vaginal bleeding and LOF. States that she has only felt baby once during this pregnancy.

## 2024-01-31 NOTE — Final Progress Note (Signed)
 OB/Triage Note  Patient ID: Colleen Pearson MRN: [id number] DOB/AGE: Jan 22, 1991 33 y.o.  Subjective  History of Present Illness: The patient is a 33 y.o. female [phone] at [redacted]w[redacted]d who presents via EMS for abdominal pain for the past three days. This morning around 6am she was awoken by pelvic pain that felt like something ripped. She feels a lot of pressure and pain, rates pain 7/10. Her pain is constant. Has vomiting but unchanged from her baseline, has been vomiting since learning she was pregnant. Denies diarrhea, fever, bleeding, LOF. Has only felt baby move once this pregnancy.    Past Medical History:  Diagnosis Date   Asthma    Cocaine use complicating pregnancy in third trimester 08/30/2017   Endometriosis    Nexplanon  in place 11/04/2020   Ovarian cyst    Seizures (HCC)    Status post cesarean section 08/30/2017    Past Surgical History:  Procedure Laterality Date   CESAREAN SECTION N/A 08/30/2017   Procedure: CESAREAN SECTION;  Surgeon: Leonce Garnette BIRCH, MD;  Location: [facility] ORS;  Service: Obstetrics;  Laterality: N/A;   LASER ABLATION/CAUTERIZATION OF ENDOMETRIAL IMPLANTS      No current facility-administered medications on file prior to encounter.   Current Outpatient Medications on File Prior to Encounter  Medication Sig Dispense Refill   aspirin  EC 81 MG tablet Take 1 tablet (81 mg total) by mouth daily. Take after 12 weeks for prevention of preeclampssia later in pregnancy 300 tablet 2   folic acid  (FOLVITE ) 1 MG tablet Take 1 tablet (1 mg total) by mouth daily. 30 tablet 10   guaiFENesin -dextromethorphan  (ROBITUSSIN DM) 100-10 MG/5ML syrup Take 10 mLs by mouth every 6 (six) hours as needed for cough. (Patient not taking: Reported on 01/11/2024)     lamoTRIgine  (LAMICTAL ) 150 MG tablet Take 1 tablet (150 mg total) by mouth 2 (two) times daily. 60 tablet 0   levETIRAcetam  (KEPPRA ) 1000 MG tablet Take 1 tablet (1,000 mg total) by mouth 2 (two) times daily. 60  tablet 0   melatonin 5 MG TABS Take 1 tablet (5 mg total) by mouth at bedtime as needed. (Patient not taking: Reported on 01/11/2024) 30 tablet 0   ondansetron  (ZOFRAN -ODT) 4 MG disintegrating tablet Take 1 tablet (4 mg total) by mouth every 8 (eight) hours as needed. 30 tablet 1   polyethylene glycol powder (GLYCOLAX /MIRALAX ) 17 GM/SCOOP powder Take 17 g by mouth daily. (Patient not taking: Reported on 01/11/2024) 476 g 0   Prenatal Vit-Fe Fumarate-FA (PRENATAL MULTIVITAMIN) TABS tablet Take 1 tablet by mouth daily at 12 noon. 90 tablet 3   QUEtiapine  (SEROQUEL ) 50 MG tablet Take 2 tablets (100 mg total) by mouth at bedtime AND 1 tablet (50 mg total) daily at 6 (six) AM. (Patient not taking: No sig reported) 90 tablet 0    Allergies  Allergen Reactions   Lamotrigine  Hives and Swelling    Pt is currently taking this medication   Penicillins Hives and Swelling    Tolerated Zosyn  02/24/23   Ultram [Tramadol] Hives and Swelling    Social History   Socioeconomic History   Marital status: Significant Other    Spouse name: Not on file   Number of children: 1   Years of education: Not on file   Highest education level: 9th grade  Occupational History   Not on file  Tobacco Use   Smoking status: Every Day    Current packs/day: 0.25    Types: Cigarettes, E-cigarettes   Smokeless  tobacco: Never   Tobacco comments:    Cutting down, declines nicotine  replacement  Vaping Use   Vaping status: Every Day   Substances: Nicotine , Flavoring  Substance and Sexual Activity   Alcohol use: No   Drug use: Not Currently    Types: Marijuana, Cocaine    Comment: smoked marijuana no longer cocaine   Sexual activity: Yes    Birth control/protection: Surgical, Implant  Other Topics Concern   Not on file  Social History Narrative   Not on file   Social Drivers of Health   Financial Resource Strain: Medium Risk (12/27/2023)   Overall Financial Resource Strain (CARDIA)    Difficulty of Paying Living  Expenses: Somewhat hard  Food Insecurity: No Food Insecurity (12/27/2023)   Hunger Vital Sign    Worried About Running Out of Food in the Last Year: Never true    Ran Out of Food in the Last Year: Never true  Transportation Needs: No Transportation Needs (12/27/2023)   PRAPARE - Administrator, Civil Service (Medical): No    Lack of Transportation (Non-Medical): No  Physical Activity: Inactive (12/27/2023)   Exercise Vital Sign    Days of Exercise per Week: 0 days    Minutes of Exercise per Session: 0 min  Stress: No Stress Concern Present (12/27/2023)   [employer] of Occupational Health - Occupational Stress Questionnaire    Feeling of Stress: Not at all  Social Connections: Unknown (12/27/2023)   Social Connection and Isolation Panel    Frequency of Communication with Friends and Family: Once a week    Frequency of Social Gatherings with Friends and Family: Once a week    Attends Religious Services: Never    Database administrator or Organizations: No    Attends Banker Meetings: Never    Marital Status: Patient unable to answer  Intimate Partner Violence: Not At Risk (12/27/2023)   Humiliation, Afraid, Rape, and Kick questionnaire    Fear of Current or Ex-Partner: No    Emotionally Abused: No    Physically Abused: No    Sexually Abused: No    Family History  Problem Relation Age of Onset   Down syndrome Sister      ROS    Objective  Physical Exam: BP (!) 104/50 (BP Location: Left Arm)   Temp 98 F (36.7 C) (Oral)   Resp 18   Ht 5' 1 (1.549 m)   Wt 29.5 kg   LMP  (LMP Unknown)   BMI 12.31 kg/m   OBGyn Exam  FHT 140s Toco No contractions detected  Significant Findings/ Diagnostic Studies: ultrasound pending   Hospital Course: The patient was admitted to [facility] Triage for observation. Reported pain 7/10 although looked comfortable resting in bed. Was taken to u/s to r/o abruption. While there her pain decreased to 4/10. Once back in OB  Triage she stated that her pain was fully resolved and that she wanted to go home. U/s was pending but with complete resolution of symptoms will d/c home and await pending u/s report.   Assessment: 33 y.o. female [phone] at [redacted]w[redacted]d  Abdominal pain- resolved  Plan: Discharge home Follow up at next ROB or sooner as needed Teaching: return if symptoms return  Discharge Instructions     Discharge activity:  No Restrictions   Complete by: As directed    Discharge diet:  No restrictions   Complete by: As directed    Discharge instructions   Complete by: As directed  Follow up with your OB provider at your next ROB   No sexual activity restrictions   Complete by: As directed    Notify physician for a general feeling that something is not right   Complete by: As directed    Notify physician for increase or change in vaginal discharge   Complete by: As directed    Notify physician for intestinal cramps, with or without diarrhea, sometimes described as gas pain   Complete by: As directed    Notify physician for leaking of fluid   Complete by: As directed    Notify physician for low, dull backache, unrelieved by heat or Tylenol    Complete by: As directed    Notify physician for menstrual like cramps   Complete by: As directed    Notify physician for pelvic pressure   Complete by: As directed    Notify physician for uterine contractions.  These may be painless and feel like the uterus is tightening or the baby is  balling up   Complete by: As directed    Notify physician for vaginal bleeding   Complete by: As directed    PRETERM LABOR:  Includes any of the follwing symptoms that occur between 20 - [redacted] weeks gestation.  If these symptoms are not stopped, preterm labor can result in preterm delivery, placing your baby at risk   Complete by: As directed       Allergies as of 01/31/2024       Reactions   Lamotrigine  Hives, Swelling   Pt is currently taking this medication    Penicillins Hives, Swelling   Tolerated Zosyn  02/24/23   Ultram [tramadol] Hives, Swelling        Medication List     TAKE these medications    aspirin  EC 81 MG tablet Take 1 tablet (81 mg total) by mouth daily. Take after 12 weeks for prevention of preeclampssia later in pregnancy   folic acid  1 MG tablet Commonly known as: FOLVITE  Take 1 tablet (1 mg total) by mouth daily.   guaiFENesin -dextromethorphan  100-10 MG/5ML syrup Commonly known as: ROBITUSSIN DM Take 10 mLs by mouth every 6 (six) hours as needed for cough.   lamoTRIgine  150 MG tablet Commonly known as: LAMICTAL  Take 1 tablet (150 mg total) by mouth 2 (two) times daily.   levETIRAcetam  1000 MG tablet Commonly known as: Keppra  Take 1 tablet (1,000 mg total) by mouth 2 (two) times daily.   melatonin 5 MG Tabs Take 1 tablet (5 mg total) by mouth at bedtime as needed.   ondansetron  4 MG disintegrating tablet Commonly known as: ZOFRAN -ODT Take 1 tablet (4 mg total) by mouth every 8 (eight) hours as needed.   polyethylene glycol powder 17 GM/SCOOP powder Commonly known as: GLYCOLAX /MIRALAX  Take 17 g by mouth daily.   prenatal multivitamin Tabs tablet Take 1 tablet by mouth daily at 12 noon.   QUEtiapine  50 MG tablet Commonly known as: SEROquel  Take 2 tablets (100 mg total) by mouth at bedtime AND 1 tablet (50 mg total) daily at 6 (six) AM.         Total time spent taking care of this patient: 2 hours  Signed: Lolita Loots CNM, FNP 01/31/2024, 2:21 PM

## 2024-01-31 NOTE — ED Notes (Signed)
 Pt from home via [facility], pt reports lower abd pain/pelvic pain. Denies liquid leaking/bleeding. Pt

## 2024-01-31 NOTE — ED Notes (Signed)
 Pt to ED via [facility] reports lower abd pain/pelvic pain. EDD of 12/28, Pregnancy wheel- [redacted]w[redacted]d. Pt denies leaking of fluid or bleeding. Has only felt baby move once since start of pregnancy so unsure of fetal movement. Pt denies any other complaints. G3P1. Uses [facility] for her prenatal care.

## 2024-02-01 ENCOUNTER — Encounter: Admitting: Obstetrics

## 2024-02-01 ENCOUNTER — Other Ambulatory Visit

## 2024-02-08 ENCOUNTER — Ambulatory Visit (INDEPENDENT_AMBULATORY_CARE_PROVIDER_SITE_OTHER): Admitting: Certified Nurse Midwife

## 2024-02-08 ENCOUNTER — Other Ambulatory Visit (HOSPITAL_COMMUNITY)
Admission: RE | Admit: 2024-02-08 | Discharge: 2024-02-08 | Disposition: A | Discharge status: Home / Self Care | Source: Ambulatory Visit | Attending: Certified Nurse Midwife | Admitting: Certified Nurse Midwife

## 2024-02-08 VITALS — BP 125/43 | HR 107 | Wt 139.7 lb

## 2024-02-08 DIAGNOSIS — Z3480 Encounter for supervision of other normal pregnancy, unspecified trimester: Secondary | ICD-10-CM

## 2024-02-08 DIAGNOSIS — Z113 Encounter for screening for infections with a predominantly sexual mode of transmission: Secondary | ICD-10-CM

## 2024-02-08 DIAGNOSIS — Z3A21 21 weeks gestation of pregnancy: Secondary | ICD-10-CM | POA: Insufficient documentation

## 2024-02-08 NOTE — Progress Notes (Signed)
    Return Prenatal Note   Subjective   [name] at [redacted]w[redacted]d presents for this follow-up prenatal visit.  Patient is doing well. She has no new concerns. Repeat Aptima was done today. Patient reports: Movement: Present Contractions: Not present  Objective   Flow sheet Vitals: Pulse Rate: (!) 107 BP: (!) 125/43 Fundal Height: 21 cm Fetal Heart Rate (bpm): 150 Total weight gain: 9 lb 11.2 oz (4.4 kg)  General Appearance  No acute distress, well appearing, and well nourished Pulmonary   Normal work of breathing Neurologic   Alert and oriented to person, place, and time Psychiatric   Mood and affect within normal limits  Assessment/Plan   Plan  [name] at [redacted]w[redacted]d presents for follow-up OB visit. Reviewed prenatal record including previous visit note.  Supervision of other normal pregnancy, antepartum Doing well overall. Has MFM for anatomy scan on 8/26. Reviewed pelvic pressure with pregnancy.  Repeat aptima collected.  Reviewed red flag warning signs anticipatory guidance for upcoming prenatal care.        No orders of the defined types were placed in this encounter.  No follow-ups on file.   Future Appointments  Date Time Provider Department Center  02/15/2024  9:00 AM [facility] PROVIDER 1 [facility]  02/15/2024  9:30 AM [facility] US4 [facility]  02/15/2024 10:30 AM [facility] GENETIC COUNSELING RM [facility]  03/07/2024 10:35 AM Lemma Pearson, Colleen, CNM [facility] None    For next visit:  continue with routine prenatal care     Colleen Pearson, CNM  [facility] [place]

## 2024-02-08 NOTE — Assessment & Plan Note (Signed)
 Doing well overall. Has MFM for anatomy scan on 8/26. Reviewed pelvic pressure with pregnancy.  Repeat aptima collected.  Reviewed red flag warning signs anticipatory guidance for upcoming prenatal care.

## 2024-02-09 LAB — CERVICOVAGINAL ANCILLARY ONLY
Bacterial Vaginitis (gardnerella): NEGATIVE
Candida Glabrata: NEGATIVE
Candida Vaginitis: NEGATIVE
Chlamydia: NEGATIVE
Comment: NEGATIVE
Comment: NEGATIVE
Comment: NEGATIVE
Comment: NEGATIVE
Comment: NEGATIVE
Comment: NORMAL
Neisseria Gonorrhea: NEGATIVE
Trichomonas: NEGATIVE

## 2024-02-15 ENCOUNTER — Ambulatory Visit (HOSPITAL_BASED_OUTPATIENT_CLINIC_OR_DEPARTMENT_OTHER)

## 2024-02-15 ENCOUNTER — Other Ambulatory Visit: Payer: Self-pay | Admitting: *Deleted

## 2024-02-15 ENCOUNTER — Ambulatory Visit (HOSPITAL_BASED_OUTPATIENT_CLINIC_OR_DEPARTMENT_OTHER): Admitting: Maternal & Fetal Medicine

## 2024-02-15 ENCOUNTER — Ambulatory Visit: Attending: Certified Nurse Midwife

## 2024-02-15 VITALS — BP 106/59 | HR 99

## 2024-02-15 DIAGNOSIS — G40909 Epilepsy, unspecified, not intractable, without status epilepticus: Secondary | ICD-10-CM

## 2024-02-15 DIAGNOSIS — O34219 Maternal care for unspecified type scar from previous cesarean delivery: Secondary | ICD-10-CM

## 2024-02-15 DIAGNOSIS — Z141 Cystic fibrosis carrier: Secondary | ICD-10-CM

## 2024-02-15 DIAGNOSIS — R569 Unspecified convulsions: Secondary | ICD-10-CM | POA: Diagnosis present

## 2024-02-15 DIAGNOSIS — O09212 Supervision of pregnancy with history of pre-term labor, second trimester: Secondary | ICD-10-CM | POA: Diagnosis not present

## 2024-02-15 DIAGNOSIS — Z3A22 22 weeks gestation of pregnancy: Secondary | ICD-10-CM | POA: Diagnosis not present

## 2024-02-15 DIAGNOSIS — O99352 Diseases of the nervous system complicating pregnancy, second trimester: Secondary | ICD-10-CM

## 2024-02-15 DIAGNOSIS — Z8279 Family history of other congenital malformations, deformations and chromosomal abnormalities: Secondary | ICD-10-CM | POA: Diagnosis present

## 2024-02-15 DIAGNOSIS — O09893 Supervision of other high risk pregnancies, third trimester: Secondary | ICD-10-CM | POA: Insufficient documentation

## 2024-02-15 DIAGNOSIS — O09892 Supervision of other high risk pregnancies, second trimester: Secondary | ICD-10-CM | POA: Diagnosis not present

## 2024-02-15 DIAGNOSIS — Z363 Encounter for antenatal screening for malformations: Secondary | ICD-10-CM | POA: Insufficient documentation

## 2024-02-15 DIAGNOSIS — Z148 Genetic carrier of other disease: Secondary | ICD-10-CM

## 2024-02-15 NOTE — Progress Notes (Signed)
 [facility] at [facility] [address] Phone:  [phone]   Fax:  [phone]      In-Person Genetic Counseling Clinic Note:   I spoke with 33 y.o. Colleen Pearson today to discuss her carrier screening results. She was referred by Colleen Pearson, CNM.   Pregnancy History:    [id number]. EGA: [redacted]w[redacted]d by US . EDD: 06/18/2024. Colleen Pearson has a healthy 9 yo daughter. She had one IAB for personal reasons. Reports she takes Keppra , Lamictal , PNVs, and Zofran . Personal history of seizures. Denies other major personal health concerns. Denies bleeding, infections, and fevers in this pregnancy. Denies using tobacco, alcohol, or street drugs in this pregnancy.   Family History:    A three-generation pedigree was created and scanned into Epic under the Media tab.  Patient has a history of seizures that started at 30-18 yo. Her latest episode was around 2 weeks ago. She has seen neurology. No prior genetic testing. She is currently taking Keppra  and Lamictal . Family history is negative for seizures or neurological concerns. Her neurology note mentioned possible Fahr's syndrome due to calcifications. This is mainly associated with autosomal dominant inheritance, meaning risk to pregnancies would be 50%; however, this can also be associated with autosomal recessive inheritance or can be sporadic. We discussed that seizures can occur secondary to a variety of environmental, lifestyle, and genetic factors. When epilepsy does not have an identified genetic cause, the chance that a child of an affected mother will also have or develop epilepsy is approximately 4%. This may be higher in the event of an underlying genetic cause. Without knowing the etiology of the seizures in the family, precise risk assessment is limited. Because of her personal history, a referral to an adult genetics clinic was offered. She declined at this time and stated she would  like to discuss this with her OB first.  Patient reports her sister has Down syndrome. We discussed that most cases of Down syndrome occur by chance. However, without medical and genetic testing reports, familial causes to Down syndrome or the possibility of another genetic condition cannot be entirely ruled out. Patient was reassured that her NIPS was low-risk for Down syndrome. If she learns of additional information regarding her family member's diagnosis, we are happy to revisit and discuss further.   Maternal ethnicity reported as White and paternal ethnicity reported as Black. Denies Ashkenazi Jewish ancestry.  Family history not remarkable for consanguinity, individuals with birth defects, intellectual disability, autism spectrum disorder, multiple spontaneous abortions, still births, or unexplained neonatal death.   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 Colleen Pearson was not found to be a carrier for alpha thalassemia, beta-hemoglobinopathies, or spinal muscular atrophy. Please see report for details. A negative result on carrier screening reduces but does not eliminate the chance of being a carrier.  We reviewed genes, autosomal recessive inheritance of CF, and the clinical features of CF. We reviewed there would be a 25% chance the pregnancy would be affected if FOB is also found to be a carrier. If both members of a couple are known to be carriers, diagnostic testing through amniocentesis is available to determine if the pregnancy is affected. The benefits, risks, and limitations of amniocentesis including the 1 in 500 risk for miscarriage were reviewed. We also discussed  that CF is included on [facility] 's Newborn Screening Program. The newborn screening test utilizes trypsinogen levels rather than genetic testing  to detect affected individuals whereas genetic testing can determine the specific variants, if any, that a child may have inherited. Therefore, the couple may also seek postnatal genetic counseling and testing if needed or desired.   Given these results, we discussed and offered carrier screening for Colleen Pearson reproductive partner. We reviewed the benefits and limitations of carrier screening and that it can detect most but not all carriers. She declined at this time.We discussed other options, including prenatal diagnosis through amniocentesis, Colleen Pearson single gene NIPS, and postnatal testing. The benefits, risks, and limitations of each were reviewed. Colleen Pearson declined Colleen Pearson single gene NIPS and amniocentesis. She elected to wait for the NBS results and for a referral to pediatric genetics if needed.  In the meantime, we calculated the risk of CF to the pregnancy based on our current knowledge of Colleen Pearson test results and FOB's ethnicity. The chance that the fetus would be affected with CF would be 1 in 244 (0.4%). Carrier screening for FOB will allow us  to provide a more accurate risk to the fetus.    Newborn Screening. The Colleen Pearson  Newborn Screening (NBS) program will screen all newborn babies for cystic fibrosis, spinal muscular atrophy, hemoglobinopathies, and numerous other conditions.  Previous Testing Completed:  Low risk NIPS: Colleen Pearson previously completed noninvasive prenatal screening (NIPS) in this pregnancy. The result is low risk, consistent with a female fetus. This screening significantly reduces but does not eliminate the chance that the current pregnancy has Down syndrome (trisomy 93), trisomy 14, trisomy 21, and common sex chromosome conditions. Please see report for details. There are many genetic conditions that cannot be detected by NIPS.   Negative ms-AFP screening: Colleen Pearson previously completed a maternal serum AFP screen in this pregnancy. The result is screen negative. Please see  report for details. A negative result reduces the risk that the current pregnancy has an open neural tube defect. Closed neural tube defects and some open defects may not be detected by this screen.   Plan of Care:   Declined carrier screening for FOB, single gene NIPS, or amniocentesis. Declined referral to adult genetics due to personal history of seizures. Newborn screening of newborn. Routine prenatal care.   Informed consent was obtained. All questions were answered.   40 minutes were spent on the date of the encounter in service to the patient including preparation, face-to-face consultation, discussion of test reports and available next steps, pedigree construction, genetic risk assessment, documentation, and care coordination.    Thank you for sharing in the care of Colleen Pearson with us .  Please do not hesitate to contact us  at [phone] if you have any questions.   Lauraine Bodily, MS, [facility] Certified Genetic Counselor   Genetic counseling student involved in appointment: No.

## 2024-02-15 NOTE — Progress Notes (Signed)
 MFM Consultation  Ms. Colleen Pearson is s 33 yo G3P1 who is  10 w 2 d with an EDD of 06/18/24.  She is seen a the request of Harlene Cisco, CNM  She is seen given history of Seizure D/O and cystic fibrosis carrier.\  She has a LR NIPT.     02/15/2024    8:57 AM 02/08/2024   11:05 AM 01/31/2024   11:56 AM  Vitals with BMI  Weight  139 lbs 11 oz   BMI  26.41   Systolic 106 125 895  Diastolic 59 43 50  Pulse 99 107    Past Medical History:  Diagnosis Date   Asthma    Cocaine use complicating pregnancy in third trimester 08/30/2017   Endometriosis    Nexplanon  in place 11/04/2020   Ovarian cyst    Seizures (HCC)    Status post cesarean section 08/30/2017   Ms. Rody notes she has only used marijuana but has discontinued over 5-7 years agol.    Past Surgical History:  Procedure Laterality Date   CESAREAN SECTION N/A 08/30/2017   Procedure: CESAREAN SECTION;  Surgeon: Leonce Garnette BIRCH, MD;  Location: [facility] ORS;  Service: Obstetrics;  Laterality: N/A;   LASER ABLATION/CAUTERIZATION OF ENDOMETRIAL IMPLANTS     Social History   Socioeconomic History   Marital status: Significant Other    Spouse name: Not on file   Number of children: 1   Years of education: Not on file   Highest education level: 9th grade  Occupational History   Not on file  Tobacco Use   Smoking status: Former    Current packs/day: 0.25    Types: Cigarettes, E-cigarettes   Smokeless tobacco: Never   Tobacco comments:    Cutting down, declines nicotine  replacement  Vaping Use   Vaping status: Some Days   Substances: Nicotine , Flavoring  Substance and Sexual Activity   Alcohol use: No   Drug use: Not Currently    Types: Marijuana, Cocaine    Comment: smoked marijuana july 2025 no longer cocaine   Sexual activity: Yes    Birth control/protection: Surgical, Implant  Other Topics Concern   Not on file  Social History Narrative   Not on file   Social Drivers of Health   Financial Resource  Strain: Medium Risk (12/27/2023)   Overall Financial Resource Strain (CARDIA)    Difficulty of Paying Living Expenses: Somewhat hard  Food Insecurity: No Food Insecurity (12/27/2023)   Hunger Vital Sign    Worried About Running Out of Food in the Last Year: Never true    Ran Out of Food in the Last Year: Never true  Transportation Needs: No Transportation Needs (12/27/2023)   PRAPARE - Administrator, Civil Service (Medical): No    Lack of Transportation (Non-Medical): No  Physical Activity: Inactive (12/27/2023)   Exercise Vital Sign    Days of Exercise per Week: 0 days    Minutes of Exercise per Session: 0 min  Stress: No Stress Concern Present (12/27/2023)   [employer] of Occupational Health - Occupational Stress Questionnaire    Feeling of Stress: Not at all  Social Connections: Unknown (12/27/2023)   Social Connection and Isolation Panel    Frequency of Communication with Friends and Family: Once a week    Frequency of Social Gatherings with Friends and Family: Once a week    Attends Religious Services: Never    Database administrator or Organizations: No    Attends Club or  Organization Meetings: Never    Marital Status: Patient unable to answer  Intimate Partner Violence: Not At Risk (12/27/2023)   Humiliation, Afraid, Rape, and Kick questionnaire    Fear of Current or Ex-Partner: No    Emotionally Abused: No    Physically Abused: No    Sexually Abused: No   OB History     Gravida  3   Para  1   Term  0   Preterm  1   AB  1   Living  1      SAB  0   IAB  1   Ectopic      Multiple  0   Live Births  1          Social History   Socioeconomic History   Marital status: Significant Other    Spouse name: Not on file   Number of children: 1   Years of education: Not on file   Highest education level: 9th grade  Occupational History   Not on file  Tobacco Use   Smoking status: Former    Current packs/day: 0.25    Types: Cigarettes,  E-cigarettes   Smokeless tobacco: Never   Tobacco comments:    Cutting down, declines nicotine  replacement  Vaping Use   Vaping status: Some Days   Substances: Nicotine , Flavoring  Substance and Sexual Activity   Alcohol use: No   Drug use: Not Currently    Types: Marijuana, Cocaine    Comment: smoked marijuana july 2025 no longer cocaine   Sexual activity: Yes    Birth control/protection: Surgical, Implant  Other Topics Concern   Not on file  Social History Narrative   Not on file   Social Drivers of Health   Financial Resource Strain: Medium Risk (12/27/2023)   Overall Financial Resource Strain (CARDIA)    Difficulty of Paying Living Expenses: Somewhat hard  Food Insecurity: No Food Insecurity (12/27/2023)   Hunger Vital Sign    Worried About Running Out of Food in the Last Year: Never true    Ran Out of Food in the Last Year: Never true  Transportation Needs: No Transportation Needs (12/27/2023)   PRAPARE - Administrator, Civil Service (Medical): No    Lack of Transportation (Non-Medical): No  Physical Activity: Inactive (12/27/2023)   Exercise Vital Sign    Days of Exercise per Week: 0 days    Minutes of Exercise per Session: 0 min  Stress: No Stress Concern Present (12/27/2023)   [employer] of Occupational Health - Occupational Stress Questionnaire    Feeling of Stress: Not at all  Social Connections: Unknown (12/27/2023)   Social Connection and Isolation Panel    Frequency of Communication with Friends and Family: Once a week    Frequency of Social Gatherings with Friends and Family: Once a week    Attends Religious Services: Never    Database administrator or Organizations: No    Attends Banker Meetings: Never    Marital Status: Patient unable to answer  Intimate Partner Violence: Not At Risk (12/27/2023)   Humiliation, Afraid, Rape, and Kick questionnaire    Fear of Current or Ex-Partner: No    Emotionally Abused: No    Physically Abused:  No    Sexually Abused: No       Current Outpatient Medications (Analgesics):    aspirin  EC 81 MG tablet, Take 1 tablet (81 mg total) by mouth daily. Take after 12 weeks for  prevention of preeclampssia later in pregnancy (Patient not taking: Reported on 02/15/2024)  Current Outpatient Medications (Hematological):    folic acid  (FOLVITE ) 1 MG tablet, Take 1 tablet (1 mg total) by mouth daily.  Current Outpatient Medications (Other):    lamoTRIgine  (LAMICTAL ) 150 MG tablet, Take 1 tablet (150 mg total) by mouth 2 (two) times daily.   levETIRAcetam  (KEPPRA ) 1000 MG tablet, Take 1 tablet (1,000 mg total) by mouth 2 (two) times daily.   ondansetron  (ZOFRAN -ODT) 4 MG disintegrating tablet, Take 1 tablet (4 mg total) by mouth every 8 (eight) hours as needed.   Prenatal Vit-Fe Fumarate-FA (PRENATAL MULTIVITAMIN) TABS tablet, Take 1 tablet by mouth daily at 12 noon. Allergies  Allergen Reactions   Lamotrigine  Hives and Swelling    Pt is currently taking this medication   Penicillins Hives and Swelling    Tolerated Zosyn  02/24/23   Ultram [Tramadol] Hives and Swelling   Imaging: SIUP with measurements consistent with dates. Anatomy observed an normal- please see report for details. Good fetal movement and amniotic fluid volume.  Impression/Counseling:  I discussed her seizure activity, she conveyed that she has a seizure about 1-2 x year. She has mostly minor seizures where there is a short staring or small epileptic episodes vs major seizure where she loses consciousness and bowel/GU control.  She has had 3-4 minor seizures in the last 2-3 months. She sees Dr. Lane, a neurologist, for her care. She notes that she had a recent decrease in her Lamictal  but continues to take Keppra  1000 mg BID.  With respect to seizure disorder in pregnancy, we discussed that patients usually do well during pregnancy.  The most important factor seems to be the frequency of seizures prior to pregnancy.   Pregnancy has unpredictable and variable influences on epilepsy. When seizure frequency increases, it most commonly does so in the first trimester and usually reverts to the pregestational pattern at the conclusion of the pregnancy, although a few patients experience a permanent deterioration in seizure control. In general, control in patients with frequent seizures (i.e., more than one a month) before pregnancy is likely to deteriorate during the gestational period, whereas only about 25% of patients with infrequent attacks (i.e., less than one every 9 months) experience an exacerbation during pregnancies. We did review some of the factors that were responsible for eliciting seizure activity' the most concerning factor is sleep deprivation.  Other associated exacerbating factors include rapid excessive weight gain or general fatigue.   Exposure to anticonvulsant medications is associated with an increased risk of congenital anomalies such as neural tube, congenital heart and urinary tract defects, skeletal abnormalities, and cleft palate. Polytherapy is associated with a higher risk of abnormalities than monotherapy. Lamotrigine  (Lamictal ) and levetiracetam  (Keppra ) appear to have a lower rate of associated malformations than valproate, phenytoin, phenobarbital , and carbamazapine. We recommend that a targeted ultrasound and maternal serum screening be performed between the 16th and 20th weeks of gestation and if necessary, an amniocentesis for definitive analysis with regard to myelomeningoceles. However, it was emphasized that the patient had a greater than a 90% chance of having a successful pregnancy resulting in a normal newborn.  We also reviewed that controlling seizure activity is of primary importance, and that she will need to take the needed medications necessary to achieve this goal throughout her pregnancy.     In the setting of withdrawal of anticonvulsive medications, we usually recommend  withdrawing medications if the patient has been completely seizure-free for greater than two  years, evidence of a normal EEG, and have consistent follow up with neurology.  While withdrawing from medications, the patient should be instructed not to drive during this period.  Also, the patient was counseled that approximately 50% of patients relapse and will need to reinstitute medication.  If this were to occur, anticonvulsant levels should be checked frequently after implementing or increasing anti-seizure medications, especially in conjunction with folic acid  use because folate levels tend to lower anticonvulsant levels.  A daily dose of folic acid  of approximately 4 mg/day should be more than ample when instituting anticonvulsive therapy.  Drug levels should be drawn immediately before the next dose (trough levels) in order to assess if dosing is adequate.    With regard to care for the patient during pregnancy, several studies have shown a slightly increased risk of IUGR for fetuses exposed in-utero to anticonvulsant medications.  Given the frequency of her seizure I recommend serial growth exams every 4 weeks.   Finally, most patients with epilepsy are able to labor normally and achieve a vaginal delivery. Elective cesarean section may be considered in patients refractory to treatment during the third trimester or in those that exhibit status epilepticus (SE) with significant stress. During labor, repeated seizures that cannot be controlled or SE may require an operative delivery. Fetal asphyxia can occur with prolonged or repeated seizures. Cesarean delivery may also be considered when repeated absence or psychomotor seizures limit maternal awareness and ability to cooperate. Tonic-clonic seizures are seen in approximately 1% to 2% of women during labor. Lorazepam , a short-acting benzodiazepine, is the drug of choice for treating seizures acutely. The drug is administered in 2-mg boluses every 5 minutes as  necessary. Some use 5- to 10-mg boluses of diazepam as an alternative.  2) Cystic fibrosis carrier  She is seeing a Dentist to discuss inheritance risk. She is aware of the 50% chance of the fetus being a carrier and a 25% chance of inheritance if the FOB is a carrier   I spent 45 minutes with > 50% in face to face consultation and care coordination.  Nathanel DOROTHA Fetters, MD

## 2024-03-02 ENCOUNTER — Observation Stay
Admission: EM | Admit: 2024-03-02 | Discharge: 2024-03-02 | Disposition: A | Discharge status: Home / Self Care | Attending: Certified Nurse Midwife | Admitting: Certified Nurse Midwife

## 2024-03-02 DIAGNOSIS — R102 Pelvic and perineal pain: Secondary | ICD-10-CM | POA: Diagnosis not present

## 2024-03-02 DIAGNOSIS — Z3A24 24 weeks gestation of pregnancy: Secondary | ICD-10-CM | POA: Diagnosis not present

## 2024-03-02 DIAGNOSIS — O26892 Other specified pregnancy related conditions, second trimester: Principal | ICD-10-CM | POA: Insufficient documentation

## 2024-03-02 DIAGNOSIS — G2581 Restless legs syndrome: Secondary | ICD-10-CM

## 2024-03-02 DIAGNOSIS — O368320 Maternal care for abnormalities of the fetal heart rate or rhythm, second trimester, not applicable or unspecified: Secondary | ICD-10-CM | POA: Diagnosis not present

## 2024-03-02 DIAGNOSIS — O99352 Diseases of the nervous system complicating pregnancy, second trimester: Secondary | ICD-10-CM | POA: Diagnosis not present

## 2024-03-02 DIAGNOSIS — R202 Paresthesia of skin: Secondary | ICD-10-CM | POA: Insufficient documentation

## 2024-03-02 DIAGNOSIS — O4700 False labor before 37 completed weeks of gestation, unspecified trimester: Principal | ICD-10-CM | POA: Diagnosis present

## 2024-03-02 LAB — URINALYSIS, ROUTINE W REFLEX MICROSCOPIC
Bilirubin Urine: NEGATIVE
Glucose, UA: NEGATIVE mg/dL
Hgb urine dipstick: NEGATIVE
Ketones, ur: NEGATIVE mg/dL
Leukocytes,Ua: NEGATIVE
Nitrite: NEGATIVE
Protein, ur: NEGATIVE mg/dL
Specific Gravity, Urine: 1.01 (ref 1.005–1.030)
pH: 6 (ref 5.0–8.0)

## 2024-03-02 LAB — CHLAMYDIA/NGC RT PCR (ARMC ONLY)
Chlamydia Tr: NOT DETECTED
N gonorrhoeae: NOT DETECTED

## 2024-03-02 LAB — WET PREP, GENITAL
Clue Cells Wet Prep HPF POC: NONE SEEN
Sperm: NONE SEEN
Trich, Wet Prep: NONE SEEN
WBC, Wet Prep HPF POC: <10 (ref <10)
Yeast Wet Prep HPF POC: NONE SEEN

## 2024-03-02 MED ORDER — MAGNESIUM OXIDE -MG SUPPLEMENT 400 (240 MG) MG PO TABS
800.0000 mg | ORAL_TABLET | Freq: Once | ORAL | Status: AC
  Administered 2024-03-02: 800 mg via ORAL
  Filled 2024-03-02: qty 2

## 2024-03-02 MED ORDER — SOD CITRATE-CITRIC ACID 500-334 MG/5ML PO SOLN: 30.0000 mL | ORAL | Status: DC | PRN

## 2024-03-02 MED ORDER — ACETAMINOPHEN 500 MG PO TABS: 1000.0000 mg | ORAL_TABLET | Freq: Four times a day (QID) | ORAL | Status: DC | PRN

## 2024-03-02 MED ORDER — ONDANSETRON HCL 4 MG/2ML IJ SOLN: 4.0000 mg | Freq: Four times a day (QID) | INTRAMUSCULAR | Status: DC | PRN

## 2024-03-02 MED ORDER — LACTATED RINGERS IV SOLN: 500.0000 mL | INTRAVENOUS | Status: DC | PRN

## 2024-03-02 NOTE — OB Triage Note (Signed)
 Colleen Pearson Right 33 y.o. @G3P1  @24w4dGA   presents to Labor & Delivery triage via wheelchair steered by ED staff reporting ain in legs and arms causing numbness at night that resolves during the day and muscle spasms. Patient also states pressure in the vagina that comes and goes. She denies signs and symptoms consistent with rupture of membranes or active vaginal bleeding, but does notice some yellow discharge that started around 3am. She denies contractions and states positive fetal movement. External FM and TOCO applied to non-tender abdomen. Initial FHR 141. Vital signs obtained and within normal limits. Patient oriented to care environment including call bell and bed control use. Harlene Cisco, CNM notified of patient's arrival.

## 2024-03-02 NOTE — Discharge Summary (Signed)
 LABOR & DELIVERY OB TRIAGE NOTE  SUBJECTIVE  HPI Colleen Pearson is a 33 y.o. H6E9888 at [redacted]w[redacted]d who presents to Labor & Delivery BIBA for pelvic pressure, leg pain & numbness, arm numbness. Reports all symptoms started several days ago. Also reports yellow vaginal discharge, first noticed at 3am, denies loss of fluid, vaginal bleeding or recent IC. Her leg & arm numbness are worse at night and she also reports jerking of her legs that wakes her up. Pain does wrap from lower back down. Pelvic pressure has been off/on for last few days and feels like something is coming out of her vagina due to the pressure. Denies seizure activity. Reports taking Lamictal  & Keppra  as prescribed. Denies substance use.  OB History     Gravida  3   Para  1   Term  0   Preterm  1   AB  1   Living  1      SAB  0   IAB  1   Ectopic      Multiple  0   Live Births  1           Scheduled Meds: Continuous Infusions:  lactated ringers      PRN Meds:.acetaminophen , lactated ringers , ondansetron , sodium citrate -citric acid   OBJECTIVE  BP (!) 96/42   Pulse 60   LMP  (LMP Unknown)   General: A&Ox3, drowsy but easily rouses Heart: regular rate Lungs: normal work of breathing Abdomen: gravid, soft, nontender to palpation Cervical exam: Dilation: Closed  SSE: visually closed, no lesions, swabs collected +friability with minimal bleeding  NST I reviewed the NST and it was reactive & appropriate for gestational age.  Baseline: 145 Variability: moderate Accelerations: present Decelerations:variable x1 Toco: quiet Category I  Results for orders placed or performed during the hospital encounter of 03/02/24 (from the past 24 hours)  Urinalysis, Routine w reflex microscopic -Urine, Clean Catch     Status: Abnormal   Collection Time: 03/02/24  9:42 PM  Result Value Ref Range   Color, Urine STRAW (A) YELLOW   APPearance CLEAR (A) CLEAR   Specific Gravity, Urine 1.010 1.005 - 1.030    pH 6.0 5.0 - 8.0   Glucose, UA NEGATIVE NEGATIVE mg/dL   Hgb urine dipstick NEGATIVE NEGATIVE   Bilirubin Urine NEGATIVE NEGATIVE   Ketones, ur NEGATIVE NEGATIVE mg/dL   Protein, ur NEGATIVE NEGATIVE mg/dL   Nitrite NEGATIVE NEGATIVE   Leukocytes,Ua NEGATIVE NEGATIVE  Wet prep, genital     Status: None   Collection Time: 03/02/24  9:42 PM  Result Value Ref Range   Yeast Wet Prep HPF POC NONE SEEN NONE SEEN   Trich, Wet Prep NONE SEEN NONE SEEN   Clue Cells Wet Prep HPF POC NONE SEEN NONE SEEN   WBC, Wet Prep HPF POC <10 <10   Sperm NONE SEEN     No results found.  ASSESSMENT Impression  1) Pregnancy at H6E9888, [redacted]w[redacted]d, Estimated Date of Delivery: 06/18/24 2) Reassuring maternal/fetal status 3) No evidence of preterm labor 4) Restless legs, possible sciatic pain  PLAN UA & Wet prep negative for infection. Cervix visually closed. Abdominal binder provided & use demonstrated for lower back & abdominal support. Magnesium  oxide x1 given with some relief of restless legs. Preterm labor & fetal movement precautions reinforced. Maintain next visit as scheduled.  Harlene LITTIE Cisco, CNM 03/02/24  11:04 PM

## 2024-03-02 NOTE — OB Triage Note (Signed)
 Discharge instructions provided to patient.  Patient verbalized understanding. Pt educated on signs and symptoms of labor, vaginal bleeding, LOF, and fetal movement. Red flag signs reviewed by RN. Patient discharged home by self in stable condition.

## 2024-03-04 LAB — URINE CULTURE

## 2024-03-06 ENCOUNTER — Emergency Department
Admission: EM | Admit: 2024-03-06 | Discharge: 2024-03-06 | Disposition: A | Discharge status: Home / Self Care | Attending: Emergency Medicine | Admitting: Emergency Medicine

## 2024-03-06 ENCOUNTER — Other Ambulatory Visit: Payer: Self-pay

## 2024-03-06 DIAGNOSIS — Z3A25 25 weeks gestation of pregnancy: Secondary | ICD-10-CM | POA: Diagnosis not present

## 2024-03-06 DIAGNOSIS — O99352 Diseases of the nervous system complicating pregnancy, second trimester: Secondary | ICD-10-CM | POA: Insufficient documentation

## 2024-03-06 DIAGNOSIS — G40909 Epilepsy, unspecified, not intractable, without status epilepticus: Secondary | ICD-10-CM | POA: Diagnosis not present

## 2024-03-06 DIAGNOSIS — O26892 Other specified pregnancy related conditions, second trimester: Secondary | ICD-10-CM | POA: Diagnosis present

## 2024-03-06 DIAGNOSIS — R569 Unspecified convulsions: Secondary | ICD-10-CM

## 2024-03-06 LAB — URINALYSIS, W/ REFLEX TO CULTURE (INFECTION SUSPECTED)
Bilirubin Urine: NEGATIVE
Glucose, UA: NEGATIVE mg/dL
Hgb urine dipstick: NEGATIVE
Ketones, ur: NEGATIVE mg/dL
Leukocytes,Ua: NEGATIVE
Nitrite: NEGATIVE
Protein, ur: 100 mg/dL — AB
Specific Gravity, Urine: 1.021 (ref 1.005–1.030)
pH: 5 (ref 5.0–8.0)

## 2024-03-06 LAB — COMPREHENSIVE METABOLIC PANEL WITH GFR
ALT: 13 U/L (ref 0–44)
AST: 28 U/L (ref 15–41)
Albumin: 2.7 g/dL — ABNORMAL LOW (ref 3.5–5.0)
Alkaline Phosphatase: 61 U/L (ref 38–126)
Anion gap: 12 (ref 5–15)
BUN: 10 mg/dL (ref 6–20)
CO2: 18 mmol/L — ABNORMAL LOW (ref 22–32)
Calcium: 8 mg/dL — ABNORMAL LOW (ref 8.9–10.3)
Chloride: 107 mmol/L (ref 98–111)
Creatinine, Ser: 0.7 mg/dL (ref 0.44–1.00)
GFR, Estimated: >60 mL/min (ref >60)
Glucose, Bld: 78 mg/dL (ref 70–99)
Potassium: 3.7 mmol/L (ref 3.5–5.1)
Sodium: 137 mmol/L (ref 135–145)
Total Bilirubin: 0.4 mg/dL (ref 0.0–1.2)
Total Protein: 6.1 g/dL — ABNORMAL LOW (ref 6.5–8.1)

## 2024-03-06 LAB — CBC
HCT: 27.7 % — ABNORMAL LOW (ref 36.0–46.0)
Hemoglobin: 9.5 g/dL — ABNORMAL LOW (ref 12.0–15.0)
MCH: 29.5 pg (ref 26.0–34.0)
MCHC: 34.3 g/dL (ref 30.0–36.0)
MCV: 86 fL (ref 80.0–100.0)
Platelets: 253 K/uL (ref 150–400)
RBC: 3.22 MIL/uL — ABNORMAL LOW (ref 3.87–5.11)
RDW: 13.6 % (ref 11.5–15.5)
WBC: 7.9 K/uL (ref 4.0–10.5)
nRBC: 0 % (ref 0.0–0.2)

## 2024-03-06 MED ORDER — LEVETIRACETAM (KEPPRA) 500 MG/5 ML ADULT IV PUSH
1000.0000 mg | INTRAVENOUS | Status: AC
  Administered 2024-03-06: 1000 mg via INTRAVENOUS
  Filled 2024-03-06: qty 10

## 2024-03-06 NOTE — ED Provider Notes (Signed)
 [facility] Provider Note    Event Date/Time   First MD Initiated Contact with Patient 03/06/24 1757     (approximate)   History   Seizures   HPI  Colleen Pearson is a 33 y.o. female  [id number], about 25w pregnant now, patient also reports longstanding history of epilepsy and is currently taking Keppra  and Lamictal .  She reports she took both today at about 5 PM.  She is compliant with her seizure medicine.  Her last seizure was about 2 weeks ago.  She does not drive at all  She is not having abdominal pain no vaginal discharge no bleeding no contractile discomfort.  Was at dinner with family.  Patient does not recall what happened.  She did bite her tongue and she feels like she had a seizure.  Report by EMS is that she had a witnessed seizure.  It was no fall or injury.  She was briefly postictal but is now back to being well oriented.  She is not had any acute obstetric complaint but is reportedly [redacted] weeks pregnant  Patient reports no pain discomfort or concerns at this time other than her tongue being slightly sore where she bit it.      Physical Exam   Triage Vital Signs: ED Triage Vitals  Encounter Vitals Group     BP 03/06/24 1749 103/61     Girls Systolic BP Percentile --      Girls Diastolic BP Percentile --      Boys Systolic BP Percentile --      Boys Diastolic BP Percentile --      Pulse Rate 03/06/24 1749 85     Resp 03/06/24 1749 13     Temp 03/06/24 1749 98.2 F (36.8 C)     Temp Source 03/06/24 1749 Oral     SpO2 03/06/24 1749 100 %     Weight 03/06/24 1748 143 lb 4.8 oz (65 kg)     Height 03/06/24 1748 5' 1 (1.549 m)     Head Circumference --      Peak Flow --      Pain Score 03/06/24 1748 0     Pain Loc --      Pain Education --      Exclude from Growth Chart --     Most recent vital signs: Vitals:   03/06/24 1749 03/06/24 1902  BP: 103/61 (!) 100/57  Pulse: 85 72  Resp: 13 16  Temp: 98.2 F (36.8 C)   SpO2:  100% 100%     General: Awake, no distress.  Very pleasant normocephalic atraumatic moves all extremities well.  Abdomen soft nontender nondistended.  Obviously gravid.  Fetal heart tone approximately 140-150 by Doppler CV:  Good peripheral perfusion.  Normal tones and rate Resp:  Normal effort.  Clear bilateral Abd:  No distention.  Gravid soft and nontender Other:  No edema  Blood pressure without acute elevation   ED Results / Procedures / Treatments   Labs (all labs ordered are listed, but only abnormal results are displayed) Labs Reviewed  CBC - Abnormal; Notable for the following components:      Result Value   RBC 3.22 (*)    Hemoglobin 9.5 (*)    HCT 27.7 (*)    All other components within normal limits  COMPREHENSIVE METABOLIC PANEL WITH GFR - Abnormal; Notable for the following components:   CO2 18 (*)    Calcium  8.0 (*)    Total  Protein 6.1 (*)    Albumin 2.7 (*)    All other components within normal limits  URINALYSIS, W/ REFLEX TO CULTURE (INFECTION SUSPECTED) - Abnormal; Notable for the following components:   Color, Urine YELLOW (*)    APPearance CLEAR (*)    Protein, ur 100 (*)    Bacteria, UA RARE (*)    All other components within normal limits     EKG  Interpreted by me at 1750 heart rate 90 QRS 90 QTc 440 No evidence acute ischemia   RADIOLOGY  No indication for imaging noted at this time no obstetrical complaint.  No central neurologic complaint at this time, no focal findings.  Established history of epilepsy   PROCEDURES:  Critical Care performed: No  Procedures   MEDICATIONS ORDERED IN ED: Medications  levETIRAcetam  (KEPPRA ) undiluted injection 1,000 mg (1,000 mg Intravenous Given 03/06/24 1856)     IMPRESSION / MDM / ASSESSMENT AND PLAN / ED COURSE  I reviewed the triage vital signs and the nursing notes.                              Differential diagnosis includes, but is not limited to, seizure possible secondary to  epilepsy, preeclamptic, electrolyte abnormality dehydration syncopal episode etc.  She did bite her tongue has a history of seizure witnessed seizure-like activity with postictal state consistent with seizure.  She does not have any clinical findings highly suggestive of preeclamptic type cause or severe maternal hypertension.  CBC platelets LFTs appropriate as well.  Patient reports compliance with her medication does report she had a seizure about 2 weeks ago and did not seek medical care.  Will give Keppra  load here monitor carefully, check urinalysis.  She does not complain of any acute urinary complaints no acute obstetrical complaints reassuring abdominal/OB assessed  Patient's presentation is most consistent with acute complicated illness / injury requiring diagnostic workup.      Clinical Course as of 03/06/24 2018  Mon Mar 06, 2024  2011 Patient resting comfortably.  Fully alert and oriented.  Is requesting discharge.  She reports she has appointment with [facility] tomorrow morning.  We discussed her seizures, she does not drive, she does not work report herself in dangerous areas, she will stay compliant with her medications as prescribed and be following up with OB in the morning. [MQ]  2012 I did recommend labor and delivery monitoring as she just had a seizure but patient declines this.  She reports she feels probably fine baby is moving normally no contractions no abdominal pain no fluid leakage. [MQ]    Clinical Course User Index [MQ] Dicky Anes, MD   ----------------------------------------- 8:17 PM on 03/06/2024 ----------------------------------------- Patient fully alert oriented.  Plans to call family for a ride home.  Seizure precautions discussed and she has appointment tomorrow with OB.  I did discuss with Dr. Starla the on-call [facility] physician, and they will plan to see the patient tomorrow and consult with her outpatient neurologist.  Return precautions and treatment  recommendations and follow-up discussed with the patient who is agreeable with the plan.   FINAL CLINICAL IMPRESSION(S) / ED DIAGNOSES   Final diagnoses:  Seizure (HCC)     Rx / DC Orders   ED Discharge Orders     None        Note:  This document was prepared using Dragon voice recognition software and may include unintentional dictation errors.  Dicky Anes, MD 03/06/24 2018

## 2024-03-06 NOTE — ED Triage Notes (Addendum)
 Pt arrives via [facility] from [facility] with C/O seizures. Pt has hx of seizures, takes keppra , last seizure was two weeks ago. Pt 6 months pregnant  EMS vitals 105/69 96 121BG 99o2

## 2024-03-06 NOTE — Discharge Instructions (Addendum)
 NO driving.  Follow-up with your obstetrical doctor at your appointment tomorrow and please make certain to speak to them about your seizure today.

## 2024-03-06 NOTE — Progress Notes (Deleted)
    Return Prenatal Note   Subjective   33 y.o. H6E9888 at [redacted]w[redacted]d presents for this follow-up prenatal visit.  Patient *** Patient reports:    Objective   Flow sheet Vitals:   Total weight gain: 9 lb 11.2 oz (4.4 kg)  General Appearance  No acute distress, well appearing, and well nourished Pulmonary   Normal work of breathing Neurologic   Alert and oriented to person, place, and time Psychiatric   Mood and affect within normal limits   Assessment/Plan   Plan  33 y.o. [id number] at [redacted]w[redacted]d presents for follow-up OB visit. Reviewed prenatal record including previous visit note.  No problem-specific Assessment & Plan notes found for this encounter.      No orders of the defined types were placed in this encounter.  No follow-ups on file.   Future Appointments  Date Time Provider Department Center  03/07/2024 10:35 AM Slaughterbeck, Damien, CNM [facility] None  03/14/2024  1:15 PM [facility] PROVIDER 1 [facility]  03/14/2024  1:30 PM [facility] US6 [facility]  04/11/2024  1:15 PM [facility] PROVIDER 1 [facility]  04/11/2024  1:30 PM [facility] US1 [facility]    For next visit:  ROB with 1 hour glucola, third trimester labs, and Tdap Medicaid form     Damien Parsley, CNM [facility] 09/15/251:35 PM

## 2024-03-07 ENCOUNTER — Encounter: Admitting: Certified Nurse Midwife

## 2024-03-07 DIAGNOSIS — Z114 Encounter for screening for human immunodeficiency virus [HIV]: Secondary | ICD-10-CM

## 2024-03-07 DIAGNOSIS — Z131 Encounter for screening for diabetes mellitus: Secondary | ICD-10-CM

## 2024-03-07 DIAGNOSIS — Z3A25 25 weeks gestation of pregnancy: Secondary | ICD-10-CM

## 2024-03-07 DIAGNOSIS — Z113 Encounter for screening for infections with a predominantly sexual mode of transmission: Secondary | ICD-10-CM

## 2024-03-07 DIAGNOSIS — Z130 Encounter for screening for diseases of the blood and blood-forming organs and certain disorders involving the immune mechanism: Secondary | ICD-10-CM

## 2024-03-07 DIAGNOSIS — Z3480 Encounter for supervision of other normal pregnancy, unspecified trimester: Secondary | ICD-10-CM

## 2024-03-14 ENCOUNTER — Other Ambulatory Visit: Payer: Self-pay | Admitting: *Deleted

## 2024-03-14 ENCOUNTER — Ambulatory Visit (HOSPITAL_BASED_OUTPATIENT_CLINIC_OR_DEPARTMENT_OTHER)

## 2024-03-14 ENCOUNTER — Ambulatory Visit: Attending: Maternal & Fetal Medicine | Admitting: Obstetrics and Gynecology

## 2024-03-14 VITALS — BP 103/53 | HR 76

## 2024-03-14 DIAGNOSIS — Z3A26 26 weeks gestation of pregnancy: Secondary | ICD-10-CM | POA: Diagnosis not present

## 2024-03-14 DIAGNOSIS — Z141 Cystic fibrosis carrier: Secondary | ICD-10-CM

## 2024-03-14 DIAGNOSIS — Z362 Encounter for other antenatal screening follow-up: Secondary | ICD-10-CM | POA: Insufficient documentation

## 2024-03-14 DIAGNOSIS — O99352 Diseases of the nervous system complicating pregnancy, second trimester: Secondary | ICD-10-CM | POA: Insufficient documentation

## 2024-03-14 DIAGNOSIS — O99353 Diseases of the nervous system complicating pregnancy, third trimester: Secondary | ICD-10-CM

## 2024-03-14 DIAGNOSIS — O34219 Maternal care for unspecified type scar from previous cesarean delivery: Secondary | ICD-10-CM | POA: Diagnosis not present

## 2024-03-14 DIAGNOSIS — Z79899 Other long term (current) drug therapy: Secondary | ICD-10-CM | POA: Diagnosis not present

## 2024-03-14 DIAGNOSIS — O99322 Drug use complicating pregnancy, second trimester: Secondary | ICD-10-CM | POA: Diagnosis not present

## 2024-03-14 DIAGNOSIS — O09292 Supervision of pregnancy with other poor reproductive or obstetric history, second trimester: Secondary | ICD-10-CM | POA: Insufficient documentation

## 2024-03-14 DIAGNOSIS — G40909 Epilepsy, unspecified, not intractable, without status epilepticus: Secondary | ICD-10-CM | POA: Diagnosis not present

## 2024-03-14 NOTE — Progress Notes (Signed)
 Maternal-Fetal Medicine Consultation  Name: Colleen Pearson  MRN: [id number]  GA: H6E9888 [redacted]w[redacted]d   Patient return for fetal growth assessment. - Epilepsy complicating pregnancy.  Patient takes Keppra  1000 mg twice daily and Lamictal  150 mg twice daily.  She reports she had 1 seizure episode on 03/06/2024 (?  And mild).  No history of injuries. - CF carrier.  Opted not to have partner screened. - Previous cesarean delivery.  From her operative note, I see that patient had requested elective cesarean delivery.  Now she would like to have vaginal birth.  Ultrasound Fetal growth is appropriate for gestational age.  Amniotic fluid normal good fetal activity seen.  Our concerns include: Epilepsy and antiseizure medications Recurrence of seizure episodes could be because of subtherapeutic levels of antiseizure medications (ASM).  Pregnancy causes hemodilution and dosage may have to be increased.  Patient had neurology consultation in July 2025 and has not had a follow-up. I counseled the patient that she should see her neurologist and discuss ASM. I recommend Keppra  and Lamictal  levels at her next prenatal visit.  Previous cesarean delivery I reassured the patient of normal placental location and that there is no evidence of previa or placenta accreta spectrum.  I discussed the benefits and risks of VBAC.  The risk of uterine scar dehiscence is about 1%.  Cesarean deliveries increase the risks of hemorrhage, infection and venous thromboembolism.  Repeat cesarean deliveries increase the risks of placenta previa and/or placenta accreta spectrum. Long-term complications include small bowel obstruction.  Based on Maternal-Fetal Medicine Network calculator, the likelihood of successful vaginal delivery is 63%. Patient was counseled that this is only an approximate calculation.  Patient will discuss with her provider and decide on mode of delivery.   Recommendations - Levetiracetam  (Keppra ) and the  lamotrigine  (Lamictal ) levels at her next prenatal visit. - Follow-up with her neurologist. - An appointment was made for her to return in 4 weeks for fetal growth assessment. - If patient has recurrent seizures, we recommend weekly antenatal testing from [redacted] weeks gestation until delivery.     Consultation including face-to-face (more than 50%) counseling 30 minutes.

## 2024-03-20 NOTE — Progress Notes (Unsigned)
    Return Prenatal Note   Subjective   33 y.o. H6E9888 at [redacted]w[redacted]d presents for this follow-up prenatal visit.  Patient here with her daughter.  Patient reports: Doing ok. Has had a lot of pelvic pressure denies contractions. Has ligament pain when bending. Hs some pain in her Left buttock.   Movement: Present Contractions: Irritability  Objective   Flow sheet Vitals: Pulse Rate: 63 BP: (!) 100/59 Fundal Height: 29 cm Fetal Heart Rate (bpm): 148 Total weight gain: 17 lb 12.8 oz (8.074 kg)  General Appearance  No acute distress, well appearing, and well nourished Pulmonary   Normal work of breathing Neurologic   Alert and oriented to person, place, and time Psychiatric   Mood and affect within normal limits   Assessment/Plan   Plan  33 y.o. [id number] at [redacted]w[redacted]d presents for follow-up OB visit. Reviewed prenatal record including previous visit note.  History of cesarean delivery, currently pregnant -Leaning towards RLTCS with BTL, tubal consent given to review -Needs to see MD to discuss RLTCS   Supervision of other normal pregnancy, antepartum -TWG 17, WNL  -Reassured pelvic pressure, sciatic pain and round ligament pain are normal-comfort measures reviewed -Unable to stay for 1 hour glucose test, will return in 1 week for lab only  -warning signs reviewed       No orders of the defined types were placed in this encounter.  Return in about 1 week (around 03/28/2024) for ROB, 28wk labs needs to see Dr LEIGH!!!!.   Future Appointments  Date Time Provider Department Center  03/28/2024  2:40 PM [facility] LAB [facility] None  04/11/2024 10:15 AM LEIGH Sober, MD [facility] None  04/11/2024  1:15 PM [facility] PROVIDER 1 [facility]  04/11/2024  1:30 PM [facility] US1 [facility]    For next visit:  lab only 1 hour glucose test in 1 week      Josiah Nieto M Darrelle Barrell, CNM  09/30/253:38 PM

## 2024-03-20 NOTE — Patient Instructions (Signed)
 Second Trimester of Pregnancy  The second trimester of pregnancy is from week 14 through week 27. This is months 4 through 6 of pregnancy. During the second trimester: Morning sickness is less or has stopped. You may have more energy. You may feel hungry more often. At this time, your unborn baby is growing very fast. At the end of the sixth month, the unborn baby may be up to 12 inches long and weigh about 1 pounds. You will likely start to feel the baby move between 16 and 20 weeks of pregnancy. Body changes during your second trimester Your body continues to change during this time. The changes usually go away after your baby is born. Physical changes You will gain more weight. Your belly will get bigger. You may begin to get stretch marks on your hips, belly, and breasts. Your breasts will keep growing and may hurt. You may get dark spots or blotches on your face. A dark line from your belly button to the pubic area may appear. This line is called linea nigra. Your hair may grow faster and get thicker. Health changes You may have headaches. You may have heartburn. You may pee more often. You may have swollen, bulging veins (varicose veins). You may have trouble pooping (constipation), or swollen veins in the butt that can itch or get painful (hemorrhoids). You may have back pain. This is caused by: Weight gain. Pregnancy hormones that are relaxing the joints in your pelvis. Follow these instructions at home: Medicines Talk to your health care provider if you're taking medicines. Ask if the medicines are safe to take during pregnancy. Your provider may change the medicines that you take. Do not take any medicines unless told to by your provider. Take a prenatal vitamin that has at least 600 micrograms (mcg) of folic acid. Do not use herbal medicines, illegal drugs, or medicines that are not approved by your provider. Eating and drinking While you're pregnant your body needs  extra food for your growing baby. Talk with your provider about what to eat while pregnant. Activity Most women are able to exercise during pregnancy. Exercises may need to change as your pregnancy goes on. Talk to your provider about your activities and exercise routines. Relieving pain and discomfort Wear a good, supportive bra if your breasts hurt. Rest with your legs raised if you have leg cramps or low back pain. Take warm sitz baths to soothe pain from hemorrhoids. Use hemorrhoid cream if your provider says it's okay. Do not douche. Do not use tampons or scented pads. Do not use hot tubs, steam rooms, or saunas. Safety Wear your seatbelt at all times when you're in a car. Talk to your provider if someone hits you, hurts you, or yells at you. Talk with your provider if you're feeling sad or have thoughts of hurting yourself. Lifestyle Certain things can be harmful while you're pregnant. It's best to avoid the following: Do not drink alcohol,smoke, vape, or use products with nicotine or tobacco in them. If you need help quitting, talk with your provider. Avoid cat litter boxes and soil used by cats. These things carry germs that can cause harm to your pregnancy and your baby. General instructions Keep all follow-up visits. It helps you and your unborn baby stay as healthy as possible. Write down your questions. Take them to your prenatal visits. Your provider will: Talk with you about your overall health. Give you advice or refer you to specialists who can help with different needs,  including: Prenatal education classes. Mental health and counseling. Foods and healthy eating. Ask for help if you need help with food. Where to find more information American Pregnancy Association: americanpregnancy.org [employer] Gynecologists: acog.org Office on [employer] Health: [employer] Contact a health care provider if: You have a headache that does not go away  when you take medicine. You have any of these problems: You can't eat or drink. You throw up or feel like you may throw up. You have watery poop (diarrhea) for 2 days or more. You have pain when you pee or your pee smells bad. You have been sick for 2 days or more and are not getting better. Contact your provider right away if: You have any of these coming from your vagina: Abnormal discharge. Bad-smelling fluid. Bleeding. Your baby is moving less than usual. You have contractions, belly cramping, or have pain in your pelvis or lower back. You have symptoms of high blood pressure or preeclampsia. These include: A severe, throbbing headache that does not go away. Sudden or extreme swelling of your face, hands, legs, or feet. Vision problems: You see spots. You have blurry vision. Your eyes are sensitive to light. If you can't reach the provider, go to an urgent care or emergency room. Get help right away if: You faint, become confused, or can't think clearly. You have chest pain or trouble breathing. You have any kind of injury, such as from a fall or a car crash. These symptoms may be an emergency. Call 911 right away. Do not wait to see if the symptoms will go away. Do not drive yourself to the hospital. This information is not intended to replace advice given to you by your health care provider. Make sure you discuss any questions you have with your health care provider. Document Revised: 03/11/2023 Document Reviewed: 10/09/2022 Elsevier Patient Education  2024 [employer].

## 2024-03-21 ENCOUNTER — Ambulatory Visit: Admitting: Licensed Practical Nurse

## 2024-03-21 ENCOUNTER — Encounter: Payer: Self-pay | Admitting: Licensed Practical Nurse

## 2024-03-21 VITALS — BP 100/59 | HR 63 | Wt 147.8 lb

## 2024-03-21 DIAGNOSIS — O34219 Maternal care for unspecified type scar from previous cesarean delivery: Secondary | ICD-10-CM | POA: Diagnosis not present

## 2024-03-21 DIAGNOSIS — R569 Unspecified convulsions: Secondary | ICD-10-CM

## 2024-03-21 DIAGNOSIS — Z3480 Encounter for supervision of other normal pregnancy, unspecified trimester: Secondary | ICD-10-CM

## 2024-03-21 DIAGNOSIS — Z3A27 27 weeks gestation of pregnancy: Secondary | ICD-10-CM

## 2024-03-21 NOTE — Assessment & Plan Note (Signed)
-  Leaning towards RLTCS with BTL, tubal consent given to review -Needs to see MD to discuss RLTCS

## 2024-03-21 NOTE — Assessment & Plan Note (Signed)
-  TWG 17, WNL  -Reassured pelvic pressure, sciatic pain and round ligament pain are normal-comfort measures reviewed -Unable to stay for 1 hour glucose test, will return in 1 week for lab only  -warning signs reviewed

## 2024-03-24 ENCOUNTER — Other Ambulatory Visit: Payer: Self-pay

## 2024-03-24 DIAGNOSIS — Z3A28 28 weeks gestation of pregnancy: Secondary | ICD-10-CM

## 2024-03-24 DIAGNOSIS — Z3480 Encounter for supervision of other normal pregnancy, unspecified trimester: Secondary | ICD-10-CM

## 2024-03-27 ENCOUNTER — Other Ambulatory Visit: Payer: Self-pay

## 2024-03-27 ENCOUNTER — Inpatient Hospital Stay (HOSPITAL_COMMUNITY)
Admission: EM | Admit: 2024-03-27 | Discharge: 2024-03-28 | Disposition: A | Discharge status: Home / Self Care | Attending: Obstetrics and Gynecology | Admitting: Obstetrics and Gynecology

## 2024-03-27 ENCOUNTER — Emergency Department (HOSPITAL_COMMUNITY)

## 2024-03-27 ENCOUNTER — Encounter (HOSPITAL_COMMUNITY): Payer: Self-pay | Admitting: Obstetrics and Gynecology

## 2024-03-27 DIAGNOSIS — W01110A Fall on same level from slipping, tripping and stumbling with subsequent striking against sharp glass, initial encounter: Secondary | ICD-10-CM | POA: Diagnosis not present

## 2024-03-27 DIAGNOSIS — O9A213 Injury, poisoning and certain other consequences of external causes complicating pregnancy, third trimester: Secondary | ICD-10-CM | POA: Diagnosis present

## 2024-03-27 DIAGNOSIS — O26893 Other specified pregnancy related conditions, third trimester: Secondary | ICD-10-CM | POA: Diagnosis not present

## 2024-03-27 DIAGNOSIS — Z3A28 28 weeks gestation of pregnancy: Secondary | ICD-10-CM | POA: Diagnosis not present

## 2024-03-27 DIAGNOSIS — Z141 Cystic fibrosis carrier: Secondary | ICD-10-CM | POA: Diagnosis not present

## 2024-03-27 DIAGNOSIS — S0990XA Unspecified injury of head, initial encounter: Secondary | ICD-10-CM | POA: Diagnosis present

## 2024-03-27 DIAGNOSIS — G40909 Epilepsy, unspecified, not intractable, without status epilepticus: Secondary | ICD-10-CM | POA: Insufficient documentation

## 2024-03-27 DIAGNOSIS — O09893 Supervision of other high risk pregnancies, third trimester: Secondary | ICD-10-CM | POA: Diagnosis not present

## 2024-03-27 DIAGNOSIS — W19XXXA Unspecified fall, initial encounter: Secondary | ICD-10-CM | POA: Diagnosis not present

## 2024-03-27 DIAGNOSIS — R561 Post traumatic seizures: Secondary | ICD-10-CM | POA: Diagnosis present

## 2024-03-27 DIAGNOSIS — O99353 Diseases of the nervous system complicating pregnancy, third trimester: Secondary | ICD-10-CM | POA: Insufficient documentation

## 2024-03-27 DIAGNOSIS — Z79899 Other long term (current) drug therapy: Secondary | ICD-10-CM | POA: Insufficient documentation

## 2024-03-27 DIAGNOSIS — O09892 Supervision of other high risk pregnancies, second trimester: Secondary | ICD-10-CM

## 2024-03-27 DIAGNOSIS — O34219 Maternal care for unspecified type scar from previous cesarean delivery: Secondary | ICD-10-CM | POA: Insufficient documentation

## 2024-03-27 LAB — COMPREHENSIVE METABOLIC PANEL WITH GFR
ALT: 12 U/L (ref 0–44)
AST: 20 U/L (ref 15–41)
Albumin: 2.8 g/dL — ABNORMAL LOW (ref 3.5–5.0)
Alkaline Phosphatase: 89 U/L (ref 38–126)
Anion gap: 15 (ref 5–15)
BUN: 5 mg/dL — ABNORMAL LOW (ref 6–20)
CO2: 16 mmol/L — ABNORMAL LOW (ref 22–32)
Calcium: 8.3 mg/dL — ABNORMAL LOW (ref 8.9–10.3)
Chloride: 106 mmol/L (ref 98–111)
Creatinine, Ser: 0.74 mg/dL (ref 0.44–1.00)
GFR, Estimated: >60 mL/min (ref >60)
Glucose, Bld: 110 mg/dL — ABNORMAL HIGH (ref 70–99)
Potassium: 3.1 mmol/L — ABNORMAL LOW (ref 3.5–5.1)
Sodium: 137 mmol/L (ref 135–145)
Total Bilirubin: 0.4 mg/dL (ref 0.0–1.2)
Total Protein: 6.1 g/dL — ABNORMAL LOW (ref 6.5–8.1)

## 2024-03-27 LAB — I-STAT CHEM 8, ED
BUN: 3 mg/dL — ABNORMAL LOW (ref 6–20)
Calcium, Ion: 1.09 mmol/L — ABNORMAL LOW (ref 1.15–1.40)
Chloride: 109 mmol/L (ref 98–111)
Creatinine, Ser: 0.7 mg/dL (ref 0.44–1.00)
Glucose, Bld: 113 mg/dL — ABNORMAL HIGH (ref 70–99)
HCT: 27 % — ABNORMAL LOW (ref 36.0–46.0)
Hemoglobin: 9.2 g/dL — ABNORMAL LOW (ref 12.0–15.0)
Potassium: 3.1 mmol/L — ABNORMAL LOW (ref 3.5–5.1)
Sodium: 138 mmol/L (ref 135–145)
TCO2: 16 mmol/L — ABNORMAL LOW (ref 22–32)

## 2024-03-27 LAB — CBC WITH DIFFERENTIAL/PLATELET
Abs Immature Granulocytes: 0.06 K/uL (ref 0.00–0.07)
Basophils Absolute: 0 K/uL (ref 0.0–0.1)
Basophils Relative: 0 %
Eosinophils Absolute: 0.3 K/uL (ref 0.0–0.5)
Eosinophils Relative: 3 %
HCT: 29.1 % — ABNORMAL LOW (ref 36.0–46.0)
Hemoglobin: 10.4 g/dL — ABNORMAL LOW (ref 12.0–15.0)
Immature Granulocytes: 1 %
Lymphocytes Relative: 20 %
Lymphs Abs: 2 K/uL (ref 0.7–4.0)
MCH: 29.5 pg (ref 26.0–34.0)
MCHC: 35.7 g/dL (ref 30.0–36.0)
MCV: 82.4 fL (ref 80.0–100.0)
Monocytes Absolute: 0.7 K/uL (ref 0.1–1.0)
Monocytes Relative: 7 %
Neutro Abs: 7.2 K/uL (ref 1.7–7.7)
Neutrophils Relative %: 69 %
Platelets: 295 K/uL (ref 150–400)
RBC: 3.53 MIL/uL — ABNORMAL LOW (ref 3.87–5.11)
RDW: 13.9 % (ref 11.5–15.5)
WBC: 10.2 K/uL (ref 4.0–10.5)
nRBC: 0 % (ref 0.0–0.2)

## 2024-03-27 LAB — MAGNESIUM: Magnesium: 1.8 mg/dL (ref 1.7–2.4)

## 2024-03-27 MED ORDER — ONDANSETRON HCL 4 MG/2ML IJ SOLN
4.0000 mg | Freq: Once | INTRAMUSCULAR | Status: AC
  Administered 2024-03-27: 4 mg via INTRAVENOUS
  Filled 2024-03-27: qty 2

## 2024-03-27 MED ORDER — LEVETIRACETAM (KEPPRA) 500 MG/5 ML ADULT IV PUSH
1000.0000 mg | Freq: Once | INTRAVENOUS | Status: AC
  Administered 2024-03-27: 1000 mg via INTRAVENOUS
  Filled 2024-03-27: qty 10

## 2024-03-27 MED ORDER — SODIUM CHLORIDE 0.9 % IV BOLUS
1000.0000 mL | Freq: Once | INTRAVENOUS | Status: AC
  Administered 2024-03-27: 1000 mL via INTRAVENOUS

## 2024-03-27 NOTE — ED Triage Notes (Signed)
 BIBEMS as LvL2. Pt complained about abd pain, fell and hit her head on glass table, had seizure for 2-71min. Hx seizure. Pt has hematoma to L side of head in temporal region

## 2024-03-27 NOTE — MAU Note (Signed)
 MAU Triage Note  Colleen Pearson is a 33 y.o. at [redacted]w[redacted]d here in MAU reporting: she was transferred from the ED. Arrived via EMS to the ED after she hit her head and had a seizure around 8pm. Endorses +FM, denies VB and LOF.   Onset of complaint: 2000 Pain score: denies Vitals:   03/27/24 2200 03/27/24 2215  BP: 97/82 (!) 100/45  Pulse: (!) 54 (!) 57  Resp: 11   Temp:    SpO2: 100% 100%     FHT: 133  Lab orders placed from triage: provider at bedside

## 2024-03-27 NOTE — ED Notes (Signed)
 Pt transported to CT with this RN on monitor.

## 2024-03-27 NOTE — MAU Note (Signed)
 Upon patient arrival to the unit, patient states I want to go home, I don't want to be here. Cresenzo, MD @ bedside to discuss plan of care and informs patient it would be against medical advice for her to leave. After talking with her family on the phone, patient agreeable with plan of care and will stay at this time.

## 2024-03-27 NOTE — MAU Provider Note (Signed)
 History     CSN: [id number]  Arrival date and time: 03/27/24 2105   None     Chief Complaint  Patient presents with   Fall   Seizures   HPI Patient is a 28-week G3, P1 presenting to the MAU after seizure.  Was initially evaluated in [facility] emergency department and brought in via EMS as a level 2 trauma.  Reportedly fell and hit her head and then had a seizure.  CT maxillofacial as well as CT cervical spine done showing no intracranial or cervical abnormalities.  Was given a Keppra  bolus and Keppra  and Lamictal  levels ordered.  She had a few variable decelerations on monitoring by our OB nurse so transferred to the MAU for prolonged monitoring.  OB History     Gravida  3   Para  1   Term  0   Preterm  1   AB  1   Living  1      SAB  0   IAB  1   Ectopic      Multiple  0   Live Births  1           Past Medical History:  Diagnosis Date   Asthma    Cocaine use complicating pregnancy in third trimester 08/30/2017   Endometriosis    Nexplanon  in place 11/04/2020   Ovarian cyst    Seizures (HCC)    Status post cesarean section 08/30/2017    Past Surgical History:  Procedure Laterality Date   CESAREAN SECTION N/A 08/30/2017   Procedure: CESAREAN SECTION;  Surgeon: Leonce Garnette BIRCH, MD;  Location: [facility] ORS;  Service: Obstetrics;  Laterality: N/A;   LASER ABLATION/CAUTERIZATION OF ENDOMETRIAL IMPLANTS      Family History  Problem Relation Age of Onset   Down syndrome Sister     Social History   Tobacco Use   Smoking status: Former    Current packs/day: 0.25    Types: Cigarettes, E-cigarettes   Smokeless tobacco: Never   Tobacco comments:    Cutting down, declines nicotine  replacement  Vaping Use   Vaping status: Some Days   Substances: Nicotine , Flavoring  Substance Use Topics   Alcohol use: No   Drug use: Not Currently    Types: Marijuana, Cocaine    Comment: smoked marijuana july 2025 no longer cocaine    Allergies:  Allergies   Allergen Reactions   Lamotrigine  Hives and Swelling    Pt is currently taking this medication   Penicillins Hives and Swelling    Tolerated Zosyn  02/24/23   Ultram [Tramadol] Hives and Swelling    Medications Prior to Admission  Medication Sig Dispense Refill Last Dose/Taking   aspirin  EC 81 MG tablet Take 1 tablet (81 mg total) by mouth daily. Take after 12 weeks for prevention of preeclampssia later in pregnancy (Patient not taking: Reported on 02/15/2024) 300 tablet 2    folic acid  (FOLVITE ) 1 MG tablet Take 1 tablet (1 mg total) by mouth daily. 30 tablet 10    lamoTRIgine  (LAMICTAL ) 150 MG tablet Take 1 tablet (150 mg total) by mouth 2 (two) times daily. 60 tablet 0    levETIRAcetam  (KEPPRA ) 1000 MG tablet Take 1 tablet (1,000 mg total) by mouth 2 (two) times daily. 60 tablet 0    ondansetron  (ZOFRAN -ODT) 4 MG disintegrating tablet Take 1 tablet (4 mg total) by mouth every 8 (eight) hours as needed. 30 tablet 1    Prenatal Vit-Fe Fumarate-FA (PRENATAL MULTIVITAMIN) TABS tablet Take  1 tablet by mouth daily at 12 noon. 90 tablet 3     Review of Systems  Gastrointestinal:  Negative for abdominal pain.  Genitourinary:  Negative for vaginal bleeding.  Neurological:  Positive for seizures and headaches.   Physical Exam   Blood pressure (!) 100/45, pulse (!) 57, temperature 98 F (36.7 C), temperature source Oral, resp. rate 11, height 5' 1 (1.549 m), weight 61.2 kg, SpO2 100%.  Physical Exam Vitals and nursing note reviewed.  Constitutional:      Appearance: Normal appearance.  HENT:     Head: Normocephalic and atraumatic.     Nose: No congestion or rhinorrhea.  Eyes:     Extraocular Movements: Extraocular movements intact.  Cardiovascular:     Rate and Rhythm: Normal rate.  Pulmonary:     Effort: Pulmonary effort is normal.  Abdominal:     Palpations: Abdomen is soft.     Tenderness: There is no abdominal tenderness.  Musculoskeletal:        General: Normal range of  motion.     Cervical back: Normal range of motion.  Skin:    General: Skin is warm.     Capillary Refill: Capillary refill takes less than 2 seconds.  Neurological:     General: No focal deficit present.     Mental Status: She is alert.     Cranial Nerves: No cranial nerve deficit.  Psychiatric:        Mood and Affect: Mood is anxious. Affect is angry.        Speech: Speech is tangential.        Behavior: Behavior is agitated.   NST-120 bpm, accelerations, no decelerations, moderate variability, no contractions  MAU Course  Procedures  MDM Physical exam NST  Assessment and Plan  Colleen Pearson is a 33 year old G3 P0-1-1-1 at 28 weeks 1 day presenting for seizure activity  Seizure activity Patient initially presented to the main emergency department for seizure activity.  Was evaluated by emergency department provider as well as neurology.  CT head maxillofacial and cervical spine completed showing no acute intracranial abnormalities with no displaced facial fractures or fractures of the cervical spine.  Rapid response OB nurse evaluated the patient and noted occasional variable decelerations which may be appropriate for gestational age but we brought her to the maternity assessment unit for continued monitoring. - Reports that her seizure was at approximately 8 PM so we will monitor her for 4 hours. - Blood type is O+ so no need for RhoGAM. - Spoke with neurology who recommended either increasing her Keppra  or her Lamictal .  Given her aggravated mood they recommended the Lamictal  to 200 mg twice daily.  Discussed with patient and she is agreeable. - NST remained reassuring throughout her stay - Strict return precautions given - Patient discharged home.  Colleen Pearson 03/27/2024, 11:08 PM

## 2024-03-27 NOTE — ED Provider Notes (Signed)
 [place] EMERGENCY DEPARTMENT AT [facility] Provider Note   CSN: [id number] Arrival date & time: 03/27/24  2105     Patient presents with: Fall and Seizures   Colleen Pearson is a 33 y.o. female.    Fall  Seizures Patient presents with what appears to be 28-week and 1 day gestation.  G3 P0-1-1-1.  Presented by EMS as a level 2 trauma.  Reportedly had pelvic pressure and fell forward striking her head and then had a seizure.  History of seizure disorder.  Reported compliance with the medicines.  Initially confused.  Now more aware.  Has had some vomiting however.  Is on Keppra  and Lamictal .  No abdominal pain at this time.  Still feels baby moving.  States she is thirsty.     Prior to Admission medications   Medication Sig Start Date End Date Taking? Authorizing Provider  aspirin  EC 81 MG tablet Take 1 tablet (81 mg total) by mouth daily. Take after 12 weeks for prevention of preeclampssia later in pregnancy Patient not taking: Reported on 02/15/2024 01/11/24   Jayne Harlene CROME, CNM  folic acid  (FOLVITE ) 1 MG tablet Take 1 tablet (1 mg total) by mouth daily. 01/11/24   Jayne Harlene CROME, CNM  lamoTRIgine  (LAMICTAL ) 150 MG tablet Take 1 tablet (150 mg total) by mouth 2 (two) times daily. 03/09/23   Love, Sharlet RAMAN, PA-C  levETIRAcetam  (KEPPRA ) 1000 MG tablet Take 1 tablet (1,000 mg total) by mouth 2 (two) times daily. 03/09/23 03/14/24  Love, Sharlet RAMAN, PA-C  ondansetron  (ZOFRAN -ODT) 4 MG disintegrating tablet Take 1 tablet (4 mg total) by mouth every 8 (eight) hours as needed. 01/11/24   Jayne Harlene CROME, CNM  Prenatal Vit-Fe Fumarate-FA (PRENATAL MULTIVITAMIN) TABS tablet Take 1 tablet by mouth daily at 12 noon. 01/01/24   Viviann Pastor, MD    Allergies: Lamotrigine , Penicillins, and Ultram [tramadol]    Review of Systems  Neurological:  Positive for seizures.    Updated Vital Signs BP 97/82   Pulse (!) 54   Temp 98 F (36.7 C) (Oral)   Resp 11   Ht 5'  1 (1.549 m)   Wt 61.2 kg   LMP  (LMP Unknown)   SpO2 100%   BMI 25.51 kg/m   Physical Exam Vitals and nursing note reviewed.  HENT:     Head:     Comments: Hematoma to left temple/periorbital area.  Eye movements intact. Eyes:     Extraocular Movements: Extraocular movements intact.  Cardiovascular:     Rate and Rhythm: Regular rhythm.  Abdominal:     Palpations: There is mass.     Comments: Gravid to above umbilicus.  No specific tenderness.  Musculoskeletal:     Cervical back: No tenderness.     Comments: No extremity tenderness.  Skin:    General: Skin is warm.  Neurological:     Mental Status: She is alert and oriented to person, place, and time.     (all labs ordered are listed, but only abnormal results are displayed) Labs Reviewed  CBC WITH DIFFERENTIAL/PLATELET - Abnormal; Notable for the following components:      Result Value   RBC 3.53 (*)    Hemoglobin 10.4 (*)    HCT 29.1 (*)    All other components within normal limits  I-STAT CHEM 8, ED - Abnormal; Notable for the following components:   Potassium 3.1 (*)    BUN 3 (*)    Glucose, Bld 113 (*)  Calcium , Ion 1.09 (*)    TCO2 16 (*)    Hemoglobin 9.2 (*)    HCT 27.0 (*)    All other components within normal limits  LEVETIRACETAM  LEVEL  COMPREHENSIVE METABOLIC PANEL WITH GFR  MAGNESIUM   LAMOTRIGINE  LEVEL  URINALYSIS, ROUTINE W REFLEX MICROSCOPIC    EKG: EKG Interpretation Date/Time:  Monday March 27 2024 21:22:38 EDT Ventricular Rate:  63 PR Interval:  183 QRS Duration:  108 QT Interval:  422 QTC Calculation: 432 R Axis:   83  Text Interpretation: Sinus arrhythmia Minimal ST depression, inferior leads Confirmed by Patsey Lot [phone]) on 03/27/2024 9:41:01 PM  Radiology: CT HEAD WO CONTRAST ( ) Result Date: 03/27/2024 CLINICAL DATA:  Head trauma, moderate-severe; Neck trauma, dangerous injury mechanism (Age 36-64y); Facial trauma, blunt EXAM: CT HEAD WITHOUT CONTRAST CT  MAXILLOFACIAL WITHOUT CONTRAST CT CERVICAL SPINE WITHOUT CONTRAST TECHNIQUE: Multidetector CT imaging of the head, cervical spine, and maxillofacial structures were performed using the standard protocol without intravenous contrast. Multiplanar CT image reconstructions of the cervical spine and maxillofacial structures were also generated. RADIATION DOSE REDUCTION: This exam was performed according to the departmental dose-optimization program which includes automated exposure control, adjustment of the mA and/or kV according to patient size and/or use of iterative reconstruction technique. COMPARISON:  None Available. FINDINGS: CT HEAD FINDINGS Brain: No evidence of large-territorial acute infarction. Prominent symmetric bilateral basal ganglia mineralization that are nonspecific and benign. No parenchymal hemorrhage. No mass lesion. No extra-axial collection. No mass effect or midline shift. No hydrocephalus. Basilar cisterns are patent. Vascular: No hyperdense vessel. Skull: No acute fracture or focal lesion. Other: None. CT MAXILLOFACIAL FINDINGS Osseous: No fracture or mandibular dislocation. No destructive process. Sinuses/Orbits: Paranasal sinuses and mastoid air cells are clear. The orbits are unremarkable. Soft tissues: Negative. CT CERVICAL SPINE FINDINGS Alignment: Reversal of the normal cervical lordosis likely due to positioning. Skull base and vertebrae: No acute fracture. No aggressive appearing focal osseous lesion or focal pathologic process. Soft tissues and spinal canal: No prevertebral fluid or swelling. No visible canal hematoma. Upper chest: Unremarkable. Other: None. IMPRESSION: 1. No acute intracranial abnormality. 2.  No acute displaced facial fracture. 3. No acute displaced fracture or traumatic listhesis of the cervical spine. Electronically Signed   By: Morgane  Naveau M.D.   On: 03/27/2024 22:08   CT Maxillofacial Wo Contrast Result Date: 03/27/2024 CLINICAL DATA:  Head trauma,  moderate-severe; Neck trauma, dangerous injury mechanism (Age 78-64y); Facial trauma, blunt EXAM: CT HEAD WITHOUT CONTRAST CT MAXILLOFACIAL WITHOUT CONTRAST CT CERVICAL SPINE WITHOUT CONTRAST TECHNIQUE: Multidetector CT imaging of the head, cervical spine, and maxillofacial structures were performed using the standard protocol without intravenous contrast. Multiplanar CT image reconstructions of the cervical spine and maxillofacial structures were also generated. RADIATION DOSE REDUCTION: This exam was performed according to the departmental dose-optimization program which includes automated exposure control, adjustment of the mA and/or kV according to patient size and/or use of iterative reconstruction technique. COMPARISON:  None Available. FINDINGS: CT HEAD FINDINGS Brain: No evidence of large-territorial acute infarction. Prominent symmetric bilateral basal ganglia mineralization that are nonspecific and benign. No parenchymal hemorrhage. No mass lesion. No extra-axial collection. No mass effect or midline shift. No hydrocephalus. Basilar cisterns are patent. Vascular: No hyperdense vessel. Skull: No acute fracture or focal lesion. Other: None. CT MAXILLOFACIAL FINDINGS Osseous: No fracture or mandibular dislocation. No destructive process. Sinuses/Orbits: Paranasal sinuses and mastoid air cells are clear. The orbits are unremarkable. Soft tissues: Negative. CT CERVICAL SPINE FINDINGS Alignment: Reversal of  the normal cervical lordosis likely due to positioning. Skull base and vertebrae: No acute fracture. No aggressive appearing focal osseous lesion or focal pathologic process. Soft tissues and spinal canal: No prevertebral fluid or swelling. No visible canal hematoma. Upper chest: Unremarkable. Other: None. IMPRESSION: 1. No acute intracranial abnormality. 2.  No acute displaced facial fracture. 3. No acute displaced fracture or traumatic listhesis of the cervical spine. Electronically Signed   By: Morgane   Naveau M.D.   On: 03/27/2024 22:08   CT Cervical Spine Wo Contrast Result Date: 03/27/2024 CLINICAL DATA:  Head trauma, moderate-severe; Neck trauma, dangerous injury mechanism (Age 26-64y); Facial trauma, blunt EXAM: CT HEAD WITHOUT CONTRAST CT MAXILLOFACIAL WITHOUT CONTRAST CT CERVICAL SPINE WITHOUT CONTRAST TECHNIQUE: Multidetector CT imaging of the head, cervical spine, and maxillofacial structures were performed using the standard protocol without intravenous contrast. Multiplanar CT image reconstructions of the cervical spine and maxillofacial structures were also generated. RADIATION DOSE REDUCTION: This exam was performed according to the departmental dose-optimization program which includes automated exposure control, adjustment of the mA and/or kV according to patient size and/or use of iterative reconstruction technique. COMPARISON:  None Available. FINDINGS: CT HEAD FINDINGS Brain: No evidence of large-territorial acute infarction. Prominent symmetric bilateral basal ganglia mineralization that are nonspecific and benign. No parenchymal hemorrhage. No mass lesion. No extra-axial collection. No mass effect or midline shift. No hydrocephalus. Basilar cisterns are patent. Vascular: No hyperdense vessel. Skull: No acute fracture or focal lesion. Other: None. CT MAXILLOFACIAL FINDINGS Osseous: No fracture or mandibular dislocation. No destructive process. Sinuses/Orbits: Paranasal sinuses and mastoid air cells are clear. The orbits are unremarkable. Soft tissues: Negative. CT CERVICAL SPINE FINDINGS Alignment: Reversal of the normal cervical lordosis likely due to positioning. Skull base and vertebrae: No acute fracture. No aggressive appearing focal osseous lesion or focal pathologic process. Soft tissues and spinal canal: No prevertebral fluid or swelling. No visible canal hematoma. Upper chest: Unremarkable. Other: None. IMPRESSION: 1. No acute intracranial abnormality. 2.  No acute displaced facial  fracture. 3. No acute displaced fracture or traumatic listhesis of the cervical spine. Electronically Signed   By: Morgane  Naveau M.D.   On: 03/27/2024 22:08     Procedures   Medications Ordered in the ED  levETIRAcetam  (KEPPRA ) undiluted injection 1,000 mg (1,000 mg Intravenous Given 03/27/24 2118)  ondansetron  (ZOFRAN ) injection 4 mg (4 mg Intravenous Given 03/27/24 2118)  sodium chloride  0.9 % bolus 1,000 mL (1,000 mLs Intravenous New Bag/Given 03/27/24 2200)                                    Medical Decision Making Amount and/or Complexity of Data Reviewed Labs: ordered. Radiology: ordered.  Risk Prescription drug management. Decision regarding hospitalization.   Patient with seizure.  History of same.  Had abdominal pain and fell and hit head.  Then had seizure.  Does have a history of seizure disorder however.  Will get head CT maxillofacial CT due to to trauma.  Also cervical spine CT due to mental status change.  Will get Keppra  and Lamictal  level will not return while she is here.  Will give bolus of Keppra  at this time.  Also Zofran  for the vomiting.  Rapid response OB nurse at bedside.  Patient has previous OB notes that I have reviewed also reviewed ER note and OB/GYN note.  Blood pressure of anything on the lower side.  Fluid boluses been given.  Head CT cervical spine maxillofacial CT reassuring.  I discussed with Dr. Vanessa from neurology.  Keppra  bolus given.  Will increase to 1500 twice a day.  Will need to follow with her neurologist.  However has some occasional decells on monitoring.  Patient is cleared from a traumatic standpoint.  However will transfer to MAU for further monitoring for an OB standpoint.  Discussed with Dr. Cleatus.     Final diagnoses:  Fall, initial encounter  Post traumatic seizure (HCC)  [redacted] weeks gestation of pregnancy    ED Discharge Orders     None          Patsey Lot, MD 03/27/24 2219

## 2024-03-27 NOTE — Progress Notes (Addendum)
 Spoke with Dr Cleatus - FP Attending oncall at 2130 03/27/24- notified her of pt presence for Level 2 Trauma d/t a fall where pt hit her head, resulting in a seizure that lasted 2-41min. Pt has a hx of seizures, currently on Keppra  with no missed doses. Pt reports + FM, - leaking fluid/bleeding/contractions. At this time had been on EFM for ~58min, baseline appears to be 145bpm, with 2 variable decels.  Once pt is cleared by ED, she'll need to be transferred to MAU.

## 2024-03-27 NOTE — ED Notes (Signed)
 Pt brought back from CT.

## 2024-03-28 ENCOUNTER — Other Ambulatory Visit

## 2024-03-28 DIAGNOSIS — W19XXXA Unspecified fall, initial encounter: Secondary | ICD-10-CM

## 2024-03-28 DIAGNOSIS — O26893 Other specified pregnancy related conditions, third trimester: Secondary | ICD-10-CM

## 2024-03-28 DIAGNOSIS — Z3480 Encounter for supervision of other normal pregnancy, unspecified trimester: Secondary | ICD-10-CM

## 2024-03-28 DIAGNOSIS — Z3A28 28 weeks gestation of pregnancy: Secondary | ICD-10-CM

## 2024-03-28 MED ORDER — LAMOTRIGINE 200 MG PO TABS: 200.0000 mg | ORAL_TABLET | Freq: Two times a day (BID) | ORAL | 3 refills | Status: AC

## 2024-03-28 NOTE — Discharge Instructions (Signed)
 It was a pleasure taking care of you tonight.  I am sorry you had a seizure and fell.  Your baby looks wonderful while you are here in the maternity assessment unit.  I spoke with the neurologist who recommended I increase your lamotrigine  to 200 mg twice daily.  I sent a new prescription to your pharmacy.  It is 1 tablet twice daily.  If you have any other concerns or worries please return.  I hope you have a great rest of your night!

## 2024-03-28 NOTE — MAU Note (Signed)
 Pt is being discharged and taxi voucher provided.  Taxi company: [employer].  Pt lives in [place], [place].   [facility] Plount aware.

## 2024-03-29 ENCOUNTER — Other Ambulatory Visit: Payer: Self-pay | Admitting: Licensed Practical Nurse

## 2024-03-29 ENCOUNTER — Encounter: Payer: Self-pay | Admitting: Licensed Practical Nurse

## 2024-03-29 ENCOUNTER — Telehealth: Payer: Self-pay

## 2024-03-29 DIAGNOSIS — O99019 Anemia complicating pregnancy, unspecified trimester: Secondary | ICD-10-CM | POA: Insufficient documentation

## 2024-03-29 DIAGNOSIS — D509 Iron deficiency anemia, unspecified: Secondary | ICD-10-CM

## 2024-03-29 DIAGNOSIS — O99810 Abnormal glucose complicating pregnancy: Secondary | ICD-10-CM

## 2024-03-29 LAB — 28 WEEK RH+PANEL
Basophils Absolute: 0 x10E3/uL (ref 0.0–0.2)
Basos: 0 %
EOS (ABSOLUTE): 0.1 x10E3/uL (ref 0.0–0.4)
Eos: 1 %
Gestational Diabetes Screen: 158 mg/dL — ABNORMAL HIGH (ref 70–139)
HIV Screen 4th Generation wRfx: NONREACTIVE
Hematocrit: 28.8 % — ABNORMAL LOW (ref 34.0–46.6)
Hemoglobin: 9.8 g/dL — ABNORMAL LOW (ref 11.1–15.9)
Immature Grans (Abs): 0 x10E3/uL (ref 0.0–0.1)
Immature Granulocytes: 0 %
Lymphocytes Absolute: 1.1 x10E3/uL (ref 0.7–3.1)
Lymphs: 15 %
MCH: 29.6 pg (ref 26.6–33.0)
MCHC: 34 g/dL (ref 31.5–35.7)
MCV: 87 fL (ref 79–97)
Monocytes Absolute: 0.3 x10E3/uL (ref 0.1–0.9)
Monocytes: 5 %
Neutrophils Absolute: 5.8 x10E3/uL (ref 1.4–7.0)
Neutrophils: 79 %
Platelets: 270 x10E3/uL (ref 150–450)
RBC: 3.31 x10E6/uL — ABNORMAL LOW (ref 3.77–5.28)
RDW: 13.6 % (ref 11.7–15.4)
RPR Ser Ql: NONREACTIVE
WBC: 7.4 x10E3/uL (ref 3.4–10.8)

## 2024-03-29 LAB — LAMOTRIGINE LEVEL: Lamotrigine Lvl: <1 ug/mL — ABNORMAL LOW (ref 2.0–20.0)

## 2024-03-29 LAB — LEVETIRACETAM LEVEL: Levetiracetam Lvl: <2 ug/mL — ABNORMAL LOW (ref 10.0–40.0)

## 2024-03-29 MED ORDER — ACCRUFER 30 MG PO CAPS: 1.0000 | ORAL_CAPSULE | Freq: Two times a day (BID) | ORAL | 6 refills | Status: DC

## 2024-03-29 NOTE — Progress Notes (Signed)
 Pt with elevated 1 hour, 3 hour ordered Hgb <10, iron supplement ordered Jinnie Cookey, CNM  [facility] 03/29/24  8:00 AM

## 2024-03-29 NOTE — Telephone Encounter (Signed)
-----   Message from JINNIE HERO [facility] sent at 03/29/2024  8:02 AM EDT ----- Regarding: PLease call this pt Please call this pt and let her know her one hour glucose is abnormal, she will need to do a 3 hour glucose test, she will need to be fasting for this.  Also, please let her know her hemoglobin is low, I have ordered an Iron supplement for her. We will recheck her blood count in 4 weeks.  Thanks [employer]

## 2024-03-29 NOTE — Telephone Encounter (Signed)
 I called Colleen Pearson to give her the results of her 1 hour being abnormal, she needs a 3 hour and to let her know her hemoglobin is low and Lydia sent a prescription to her pharmacy.

## 2024-04-03 NOTE — Patient Instructions (Addendum)
 Fetal Movement Counts When you're pregnant, you might start feeling your baby move around the middle of your pregnancy. At first, these movements might feel like flutters, rolls, or swishes. As your baby grows, you might feel more kicks and jabs. Around week 28 of your pregnancy, your health care team may ask you to count how often your baby moves. This is important for all pregnancies, but especially for high-risk ones. Counting movements can help lessen the risk of stillbirth. What is a fetal movement count? A fetal movement count is the number of times that you feel your baby move during a certain amount of time. This may also be called a kick count. There are many ways to do a kick count. Ask your team what is best for you. Pay attention to when your baby is most active. You may notice your baby's sleep and wake cycles. You may also notice things that make your baby move more. When you do a kick count, try to do it: When your baby is normally most active. At the same time each day. How do I count fetal movements?  Find a quiet, comfortable area. Sit or lie down. Write down the date, the start time, and the number of movements you feel. Count kicks, flutters, swishes, rolls, and jabs. Usually, you will feel at least 10 movements within 2 hours. Stop counting after you have felt 10 movements or if you have been counting for 2 hours. Write down the stop time. Contact a health care provider if: You don't feel 10 movements in 2 hours. Your baby isn't moving as it usually does. Your baby isn't moving at all. If you're not able to reach your provider, go to an emergency room. This information is not intended to replace advice given to you by your health care provider. Make sure you discuss any questions you have with your health care provider. Document Revised: 07/02/2023 Document Reviewed: 06/24/2022 Elsevier Patient Education  2025 Elsevier Inc.Third Trimester of Pregnancy  The third trimester  of pregnancy is from week 28 through week 40. This is months 7 through 9. The third trimester is a time when your baby is growing fast. Body changes during your third trimester Your body continues to change during this time. The changes usually go away after your baby is born. Physical changes You will continue to gain weight. You may get stretch marks on your hips, belly, and breasts. Your breasts will keep growing and may hurt. A yellow fluid (colostrum) may leak from your breasts. This is the first milk you're making for your baby. Your hair may grow faster and get thicker. In some cases, you may get hair loss. Your belly button may stick out. You may have more swelling in your hands, face, or ankles. Health changes You may have heartburn. You may feel short of breath. This is caused by the uterus that is now bigger. You may have more aches in the pelvis, back, or thighs. You may have more tingling or numbness in your hands, arms, and legs. You may pee more often. You may have trouble pooping (constipation) or swollen veins in the butt that can itch or get painful (hemorrhoids). Other changes You may have more problems sleeping. You may notice the baby moving lower in your belly (dropping). You may have more fluid coming from your vagina. Your joints may feel loose, and you may have pain around your pelvic bone. Follow these instructions at home: Medicines Take medicines only as told by  your health care provider. Some medicines are not safe during pregnancy. Your provider may change the medicines that you take. Do not take any medicines unless told to by your provider. Take a prenatal vitamin that has at least 600 micrograms (mcg) of folic acid. Do not use herbal medicines, illegal drugs, or medicines that are not approved by your provider. Eating and drinking While you're pregnant your body needs additional nutrition to help support your growing baby. Talk with your provider about  your nutritional needs. Activity Most women are able to exercise regularly during pregnancy. Exercise routines may need to change at the end of your pregnancy. Talk to your provider about your activities and exercise routine. Relieving pain and discomfort Rest often with your legs raised if you have leg cramps or low back pain. Take warm sitz baths to soothe pain from hemorrhoids. Use hemorrhoid cream if your provider says it's okay. Wear a good, supportive bra if your breasts hurt. Do not use hot tubs, steam rooms, or saunas. Do not douche. Do not use tampons or scented pads. Safety Talk to your provider before traveling far distances. Wear your seatbelt at all times when you're in a car. Talk to your provider if someone hits you, hurts you, or yells at you. Preparing for birth To prepare for your baby: Take childbirth and breastfeeding classes. Visit the hospital and tour the maternity area. Buy a rear-facing car seat. Learn how to install it in your car. General instructions Avoid cat litter boxes and soil used by cats. These things carry germs that can cause harm to your pregnancy and your baby. Do not drink alcohol, smoke, vape, or use products with nicotine  or tobacco in them. If you need help quitting, talk with your provider. Keep all follow-up visits for your third trimester. Your provider will do more exams and tests during this trimester. Write down your questions. Take them to your prenatal visits. Your provider also will: Talk with you about your overall health. Give you advice or refer you to specialists who can help with different needs, including: Mental health and counseling. Foods and healthy eating. Ask for help if you need help with food. Where to find more information American Pregnancy Association: americanpregnancy.org [employer]ians [employer] Gynecologists: acog.org Office on [employer] Health: [employer] Contact a health care provider if: You  have a headache that does not go away when you take medicine. You have any of these problems: You can't eat or drink. You have nausea and vomiting. You have watery poop (diarrhea) for 2 days or more. You have pain when you pee, or your pee smells bad. You have been sick for 2 days or more and aren't getting better. Contact your provider right away if: You have any of these coming from your vagina: Abnormal discharge. Bad-smelling fluid. Bleeding. Your baby is moving less than usual. You have signs of labor: You have any contractions, belly cramping, or have pain in your pelvis or lower back before 37 weeks of pregnancy (preterm labor). You have regular contractions that are less than 5 minutes apart. Your water breaks. You have symptoms of high blood pressure or preeclampsia. These include: A severe, throbbing headache that does not go away. Sudden or extreme swelling of your face, hands, legs, or feet. Vision problems: You see spots. You have blurry vision. Your eyes are sensitive to light. If you can't reach your provider, go to an urgent care or emergency room. Get help right away if: You faint, become  confused, or can't think clearly. You have chest pain or trouble breathing. You have any kind of injury, such as from a fall or a car crash. These symptoms may be an emergency. Call 911 right away. Do not wait to see if the symptoms will go away. Do not drive yourself to the hospital. This information is not intended to replace advice given to you by your health care provider. Make sure you discuss any questions you have with your health care provider. Document Revised: 03/11/2023 Document Reviewed: 10/09/2022 Elsevier Patient Education  2024 [employer].

## 2024-04-11 ENCOUNTER — Ambulatory Visit (INDEPENDENT_AMBULATORY_CARE_PROVIDER_SITE_OTHER): Admitting: Obstetrics

## 2024-04-11 ENCOUNTER — Ambulatory Visit (HOSPITAL_BASED_OUTPATIENT_CLINIC_OR_DEPARTMENT_OTHER)

## 2024-04-11 ENCOUNTER — Other Ambulatory Visit

## 2024-04-11 ENCOUNTER — Ambulatory Visit: Attending: Maternal & Fetal Medicine | Admitting: Maternal & Fetal Medicine

## 2024-04-11 VITALS — BP 96/70 | Wt 145.9 lb

## 2024-04-11 VITALS — BP 128/51

## 2024-04-11 DIAGNOSIS — Z23 Encounter for immunization: Secondary | ICD-10-CM | POA: Diagnosis not present

## 2024-04-11 DIAGNOSIS — Z141 Cystic fibrosis carrier: Secondary | ICD-10-CM

## 2024-04-11 DIAGNOSIS — O99323 Drug use complicating pregnancy, third trimester: Secondary | ICD-10-CM | POA: Diagnosis not present

## 2024-04-11 DIAGNOSIS — O99353 Diseases of the nervous system complicating pregnancy, third trimester: Secondary | ICD-10-CM

## 2024-04-11 DIAGNOSIS — F1991 Other psychoactive substance use, unspecified, in remission: Secondary | ICD-10-CM

## 2024-04-11 DIAGNOSIS — D649 Anemia, unspecified: Secondary | ICD-10-CM

## 2024-04-11 DIAGNOSIS — G40909 Epilepsy, unspecified, not intractable, without status epilepticus: Secondary | ICD-10-CM

## 2024-04-11 DIAGNOSIS — O09893 Supervision of other high risk pregnancies, third trimester: Secondary | ICD-10-CM | POA: Insufficient documentation

## 2024-04-11 DIAGNOSIS — O09213 Supervision of pregnancy with history of pre-term labor, third trimester: Secondary | ICD-10-CM | POA: Insufficient documentation

## 2024-04-11 DIAGNOSIS — O99013 Anemia complicating pregnancy, third trimester: Secondary | ICD-10-CM

## 2024-04-11 DIAGNOSIS — Z3A30 30 weeks gestation of pregnancy: Secondary | ICD-10-CM

## 2024-04-11 DIAGNOSIS — Z362 Encounter for other antenatal screening follow-up: Secondary | ICD-10-CM | POA: Insufficient documentation

## 2024-04-11 DIAGNOSIS — O368130 Decreased fetal movements, third trimester, not applicable or unspecified: Secondary | ICD-10-CM | POA: Diagnosis not present

## 2024-04-11 DIAGNOSIS — O34219 Maternal care for unspecified type scar from previous cesarean delivery: Secondary | ICD-10-CM | POA: Insufficient documentation

## 2024-04-11 DIAGNOSIS — F1910 Other psychoactive substance abuse, uncomplicated: Secondary | ICD-10-CM

## 2024-04-11 DIAGNOSIS — O0990 Supervision of high risk pregnancy, unspecified, unspecified trimester: Secondary | ICD-10-CM

## 2024-04-11 DIAGNOSIS — O99810 Abnormal glucose complicating pregnancy: Secondary | ICD-10-CM

## 2024-04-11 NOTE — Progress Notes (Signed)
 Return Prenatal Note   Subjective  33 y.o. H6E9888 at [redacted]w[redacted]d presents for this follow-up prenatal visit. Pregnancy notable for hx of polysubstance disorder, seizure disorder, prior CD x 1, CF carrier, rubella non-immune.   Patient here with daughter; reports decreased fetal movement x 1.5 days.  Seizures: last seizure 03/27/24, is taking Lamictal  and Keppra  and during ED visit on 10/6, her levels were undetectable. Pt states she is taking both medications as directed. Pt established with [facility], does not have a follow up visit with them.   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   Polysubstance abuse: last documented UDS in Epic from 02/22/2023 showing positive benzodiazepines and THC. Prior to this, documented amphetamines and cocaine on UDS in 2023. Pt states not using at this time, no UDS documented this pregnancy and declines to provide today.   Patient reports: Movement: (!) Decreased Contractions: Irregular Denies vaginal bleeding or leaking fluid. Objective  Flow sheet Vitals: BP: 96/70 Fundal Height: 30 cm Fetal Heart Rate (bpm): 130 Total weight gain: 15 lb 14.4 oz (7.212 kg)  General Appearance  No acute distress, well appearing, and well nourished Pulmonary   Normal work of breathing Neurologic   Alert and oriented to person, place, and time Psychiatric   Mood and affect within normal limits  Fetus A Non-Stress Test Interpretation for 04/11/24 Indication: Decreased Fetal Movement  Fetal Heart Rate A Mode: External Baseline Rate (A): 130 bpm Variability: Moderate Accelerations: 15 x 15 Decelerations: None Multiple birth?: No  Uterine Activity Mode: Toco Contraction Frequency (min): None noted  Interpretation (Fetal Testing) Nonstress Test Interpretation: Reactive Overall Impression: Reassuring for gestational age  Assessment/Plan   Plan  33 y.o. H6E9888 at [redacted]w[redacted]d by 13wk US   presents for follow-up OB visit. Reviewed prenatal record including previous visit note.  1. Supervision of high risk pregnancy, antepartum (Primary) -Tdap vaccine given today, declines Flu -BTC signed -PTL precautions  2. Decreased fetal movements in third trimester, single or unspecified fetus -Reactive NST today, reviewed fetal kick counting and when to notify clinic or go to OBT  3. Seizure disorder during pregnancy in third trimester [facility]) - Last known seizure 03/27/24 and Keppra /Lamictal  levels undetectable at that time, pt states has been taking since then, will check levels again today - Reaching out to [facility] for a follow up visit - Per MFM, if recurrent seizures, will need weekly surveillance from 36wks  4. 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 Anemia during pregnancy in third trimester -Check CBC at 32wk visit -Continue daily iron PO  7. History of polysubstance use disorder -No UDS results for this pregnancy and declines to provide today -Will attempt to obtain on admission for rCD (ordered)   Orders Placed This Encounter  Procedures   Tdap vaccine greater than or equal to 7yo IM   Levetiracetam  level   Lamotrigine  level   Fetal nonstress test    Standing Status:   Future    Expected Date:   04/11/2024    Expiration Date:   04/11/2025   Return in about 2 weeks (around 04/25/2024) for ROB.   Future Appointments  Date Time Provider Department Center  04/11/2024  1:15 PM [facility] PROVIDER 1 [facility]  04/11/2024  1:30 PM [facility] US1 [facility]  05/09/2024  1:15 PM [facility] PROVIDER 1 [facility]  05/09/2024  1:30 PM [facility] US4 [facility]  05/16/2024  1:15 PM [facility] PROVIDER 1 [facility]  05/16/2024  1:30 PM [facility] US1 [facility]    For next visit:  continue with routine prenatal care     Estil Mangle, DO [facility]

## 2024-04-11 NOTE — Progress Notes (Signed)
 Patient information  Patient Name: Colleen Pearson [facility]  Patient MRN:   [id number]  Referring practice: MFM Referring Provider: [facility]  Problem List   Patient Active Problem List   Diagnosis Date Noted   Seizure disorder during pregnancy in third trimester (HCC) 04/11/2024   Anemia in pregnancy 03/29/2024   Preterm contractions 03/02/2024   Abdominal pain affecting pregnancy 01/31/2024   Cystic fibrosis carrier 01/23/2024   History of cesarean delivery, currently pregnant 01/11/2024   Supervision of high risk pregnancy, antepartum 12/27/2023   Debility 03/03/2023   Dysphagia 03/03/2023   Polysubstance abuse (HCC) 03/03/2023   Tachycardia 03/03/2023   Pneumonia due to COVID-19 virus 03/01/2023   Rhabdomyolysis 03/01/2023   Aspiration pneumonia of both lower lobes (HCC) 02/24/2023   Seizures (HCC) 02/24/2023   Status epilepticus (HCC) 02/22/2023   Family history of Down syndrome     Maternal Fetal medicine Consult  Colleen Pearson is a 33 y.o. H6E9888 at [redacted]w[redacted]d here for ultrasound and consultation. Colleen Pearson is doing well today with no acute concerns. Today we focused on the following:   Seizure disorder:  Last known seizure 03/27/24 and Keppra /Lamictal  levels undetectable at that time, pt states has been taking . Drug levels are pending. Her last Lamictal  and Keppra  levels were on 10/6 and was low.  She was told by neurology to increase her Lamictal  to 200 mg twice daily.  She has done this and has not experienced any more seizures.  We discussed delivery timing is typically at 39 weeks unless she has recurrent breakthrough seizures.  However, she should have weekly natal testing starting at 32 weeks given her increased seizure activity.  This can be done at the [facility] providers office.  Hx of CD: RCD scheduled for 12/22 at 39w  The patient had time to ask questions that were answered to her satisfaction.  She verbalized understanding and agrees to proceed with the  plan below.  Sonographic findings Single intrauterine pregnancy at 30w 2d.  Fetal cardiac activity:  Observed and appears normal. Presentation: Cephalic. Interval fetal anatomy appears normal. Fetal biometry shows the estimated fetal weight at the 22 percentile. Amniotic fluid volume: Within normal limits. MVP: 4.69 cm. Placenta: Anterior.  There are limitations of prenatal ultrasound such as the inability to detect certain abnormalities due to poor visualization. Various factors such as fetal position, gestational age and maternal body habitus may increase the difficulty in visualizing the fetal anatomy.    Recommendations -Serial growth ultrasounds every 4-6 weeks until delivery - Please schedule weekly antenatal testing at the [facility] provider's office - Repeat cesarean delivery on 12/22  Review of Systems: A review of systems was performed and was negative except per HPI   Vitals and Physical Exam    04/11/2024   12:56 PM 04/11/2024   10:25 AM 03/28/2024   12:25 AM  Vitals with BMI  Weight  145 lbs 14 oz   BMI  27.58   Systolic 128 96 100  Diastolic 51 70 41  Pulse   55    Sitting comfortably on the sonogram table Nonlabored breathing Normal rate and rhythm Abdomen is nontender  Past pregnancies OB History  Gravida Para Term Preterm AB Living  3 1 0 1 1 1   SAB IAB Ectopic Multiple Live Births  0 1  0 1    # Outcome Date GA Lbr Len/2nd Weight Sex Type Anes PTL Lv  3 Current  2 Preterm 08/30/17 109w2d  5 lb 6.1 oz (2.44 kg) F CS-LTranv EPI  LIV  1 IAB      TAB        I spent 30 minutes reviewing the patients chart, including labs and images as well as counseling the patient about her medical conditions. Greater than 50% of the time was spent in direct face-to-face patient counseling.  Delora Smaller  MFM, [place]   04/11/2024  1:22 PM

## 2024-04-12 LAB — GESTATIONAL GLUCOSE TOLERANCE
Glucose, Fasting: 84 mg/dL (ref 70–94)
Glucose, GTT - 1 Hour: 161 mg/dL (ref 70–179)
Glucose, GTT - 2 Hour: 159 mg/dL — ABNORMAL HIGH (ref 70–154)
Glucose, GTT - 3 Hour: 100 mg/dL (ref 70–139)

## 2024-04-16 LAB — LAMOTRIGINE LEVEL: Lamotrigine Lvl: 2.4 ug/mL (ref 2.0–20.0)

## 2024-04-16 LAB — LEVETIRACETAM LEVEL: Levetiracetam Lvl: 25 ug/mL (ref 10.0–40.0)

## 2024-04-17 ENCOUNTER — Ambulatory Visit: Payer: Self-pay | Admitting: Obstetrics

## 2024-04-18 NOTE — Progress Notes (Signed)
 Attempted to call patient but no answer and voice mail is full.  Attempted to call [facility] Neurology but was on hold for 18 minutes without getting through to a person, and was unable to wait longer.  Will attempt to call again tomorrow.

## 2024-04-19 NOTE — Progress Notes (Signed)
 I was finally able to reach [facility] neurology.  They are unable to see her in person until February at which point she will be delivered.  Explained she is an established patient who is having seizures during pregnancy and asked if there was a way I could leave a message for a provider.  I was told there is no provider line and no way to reach providers without leaving a message with the front desk.   Patient was given a virtual appointment 04/27/24 at 10:45am with Dr. Lane.  I called and relayed this to patient who was very grateful for the assistance getting this appointment.  She plans to attend.

## 2024-04-22 ENCOUNTER — Emergency Department

## 2024-04-22 ENCOUNTER — Encounter (HOSPITAL_COMMUNITY): Payer: Self-pay

## 2024-04-22 ENCOUNTER — Other Ambulatory Visit: Payer: Self-pay

## 2024-04-22 ENCOUNTER — Emergency Department
Admission: EM | Admit: 2024-04-22 | Discharge: 2024-04-23 | Disposition: A | Discharge status: Other Acute Inpatient Hospital | Attending: Emergency Medicine | Admitting: Emergency Medicine

## 2024-04-22 DIAGNOSIS — J9600 Acute respiratory failure, unspecified whether with hypoxia or hypercapnia: Secondary | ICD-10-CM | POA: Diagnosis not present

## 2024-04-22 DIAGNOSIS — Z3A32 32 weeks gestation of pregnancy: Secondary | ICD-10-CM

## 2024-04-22 DIAGNOSIS — G40901 Epilepsy, unspecified, not intractable, with status epilepticus: Secondary | ICD-10-CM | POA: Insufficient documentation

## 2024-04-22 DIAGNOSIS — Z87891 Personal history of nicotine dependence: Secondary | ICD-10-CM | POA: Diagnosis not present

## 2024-04-22 DIAGNOSIS — O99353 Diseases of the nervous system complicating pregnancy, third trimester: Secondary | ICD-10-CM | POA: Diagnosis present

## 2024-04-22 DIAGNOSIS — J690 Pneumonitis due to inhalation of food and vomit: Secondary | ICD-10-CM | POA: Diagnosis not present

## 2024-04-22 DIAGNOSIS — G40909 Epilepsy, unspecified, not intractable, without status epilepticus: Secondary | ICD-10-CM | POA: Diagnosis not present

## 2024-04-22 DIAGNOSIS — O9952 Diseases of the respiratory system complicating childbirth: Secondary | ICD-10-CM | POA: Insufficient documentation

## 2024-04-22 DIAGNOSIS — Z3493 Encounter for supervision of normal pregnancy, unspecified, third trimester: Secondary | ICD-10-CM

## 2024-04-22 LAB — CBC
HCT: 34.5 % — ABNORMAL LOW (ref 36.0–46.0)
Hemoglobin: 11.1 g/dL — ABNORMAL LOW (ref 12.0–15.0)
MCH: 29.1 pg (ref 26.0–34.0)
MCHC: 32.2 g/dL (ref 30.0–36.0)
MCV: 90.3 fL (ref 80.0–100.0)
Platelets: 482 K/uL — ABNORMAL HIGH (ref 150–400)
RBC: 3.82 MIL/uL — ABNORMAL LOW (ref 3.87–5.11)
RDW: 15.8 % — ABNORMAL HIGH (ref 11.5–15.5)
WBC: 20.9 K/uL — ABNORMAL HIGH (ref 4.0–10.5)
nRBC: 0.1 % (ref 0.0–0.2)

## 2024-04-22 LAB — COMPREHENSIVE METABOLIC PANEL WITH GFR
ALT: 18 U/L (ref 0–44)
AST: 41 U/L (ref 15–41)
Albumin: 2.9 g/dL — ABNORMAL LOW (ref 3.5–5.0)
Alkaline Phosphatase: 167 U/L — ABNORMAL HIGH (ref 38–126)
Anion gap: 17 — ABNORMAL HIGH (ref 5–15)
BUN: 6 mg/dL (ref 6–20)
CO2: 15 mmol/L — ABNORMAL LOW (ref 22–32)
Calcium: 8.3 mg/dL — ABNORMAL LOW (ref 8.9–10.3)
Chloride: 103 mmol/L (ref 98–111)
Creatinine, Ser: 0.96 mg/dL (ref 0.44–1.00)
GFR, Estimated: >60 mL/min (ref >60)
Glucose, Bld: 273 mg/dL — ABNORMAL HIGH (ref 70–99)
Potassium: 3.2 mmol/L — ABNORMAL LOW (ref 3.5–5.1)
Sodium: 135 mmol/L (ref 135–145)
Total Bilirubin: 0.5 mg/dL (ref 0.0–1.2)
Total Protein: 6.7 g/dL (ref 6.5–8.1)

## 2024-04-22 LAB — BLOOD GAS, ARTERIAL
Acid-base deficit: 6.8 mmol/L — ABNORMAL HIGH (ref 0.0–2.0)
Bicarbonate: 22.6 mmol/L (ref 20.0–28.0)
FIO2: 100 %
MECHVT: 400 mL
Mechanical Rate: 20
O2 Saturation: 96.6 %
PEEP: 8 cmH2O
Patient temperature: 37
pCO2 arterial: 62 mmHg — ABNORMAL HIGH (ref 32–48)
pH, Arterial: 7.17 — CL (ref 7.35–7.45)
pO2, Arterial: 80 mmHg — ABNORMAL LOW (ref 83–108)

## 2024-04-22 LAB — LIPID PANEL
Cholesterol: 236 mg/dL — ABNORMAL HIGH (ref 0–200)
HDL: 97 mg/dL (ref >40)
LDL Cholesterol: 100 mg/dL — ABNORMAL HIGH (ref 0–99)
Total CHOL/HDL Ratio: 2.4 ratio
Triglycerides: 197 mg/dL — ABNORMAL HIGH (ref <150)
VLDL: 39 mg/dL (ref 0–40)

## 2024-04-22 LAB — TYPE AND SCREEN
ABO/RH(D): O POS
Antibody Screen: NEGATIVE

## 2024-04-22 LAB — CBG MONITORING, ED: Glucose-Capillary: 254 mg/dL — ABNORMAL HIGH (ref 70–99)

## 2024-04-22 MED ORDER — ROCURONIUM BROMIDE 10 MG/ML (PF) SYRINGE
PREFILLED_SYRINGE | INTRAVENOUS | Status: AC
  Administered 2024-04-22: 70 mg via INTRAVENOUS
  Filled 2024-04-22: qty 10

## 2024-04-22 MED ORDER — PROPOFOL 1000 MG/100ML IV EMUL
INTRAVENOUS | Status: AC
  Filled 2024-04-22: qty 100

## 2024-04-22 MED ORDER — MIDAZOLAM HCL (PF) 2 MG/2ML IJ SOLN
4.0000 mg | Freq: Once | INTRAMUSCULAR | Status: AC
  Administered 2024-04-22: 4 mg via INTRAVENOUS
  Filled 2024-04-22: qty 4

## 2024-04-22 MED ORDER — ROCURONIUM BROMIDE 10 MG/ML (PF) SYRINGE: 70.0000 mg | PREFILLED_SYRINGE | Freq: Once | INTRAVENOUS | Status: AC

## 2024-04-22 MED ORDER — MIDAZOLAM-SODIUM CHLORIDE 100-0.9 MG/100ML-% IV SOLN
0.5000 mg/h | INTRAVENOUS | Status: DC
  Administered 2024-04-22: 3 mg/h via INTRAVENOUS
  Filled 2024-04-22: qty 100

## 2024-04-22 MED ORDER — LEVETIRACETAM (KEPPRA) 500 MG/5 ML ADULT IV PUSH
60.0000 mg/kg | INTRAVENOUS | Status: AC
  Administered 2024-04-22: 4000 mg via INTRAVENOUS
  Filled 2024-04-22: qty 40

## 2024-04-22 MED ORDER — SODIUM CHLORIDE 0.9 % IV BOLUS
1000.0000 mL | Freq: Once | INTRAVENOUS | Status: AC
  Administered 2024-04-22: 1000 mL via INTRAVENOUS

## 2024-04-22 MED ORDER — PIPERACILLIN-TAZOBACTAM 3.375 G IVPB: 3.3750 g | Freq: Once | INTRAVENOUS | Status: DC

## 2024-04-22 MED ORDER — FENTANYL CITRATE (PF) 50 MCG/ML IJ SOSY
75.0000 ug | PREFILLED_SYRINGE | Freq: Once | INTRAMUSCULAR | Status: AC
  Administered 2024-04-22: 75 ug via INTRAVENOUS
  Filled 2024-04-22: qty 2

## 2024-04-22 MED ORDER — ETOMIDATE 2 MG/ML IV SOLN
20.0000 mg | Freq: Once | INTRAVENOUS | Status: AC
  Administered 2024-04-22: 20 mg via INTRAVENOUS

## 2024-04-22 MED ORDER — ETOMIDATE 2 MG/ML IV SOLN
INTRAVENOUS | Status: AC
  Filled 2024-04-22: qty 10

## 2024-04-22 NOTE — ED Triage Notes (Signed)
 Patient had multiple seizures, is seven months  pregnant

## 2024-04-22 NOTE — ED Notes (Signed)
 Fall risk bundle is currently in place.

## 2024-04-22 NOTE — ED Notes (Signed)
 Emtala reviewed by this RN, mother signed due to pt unable to sign due to being intubated

## 2024-04-22 NOTE — H&P (Signed)
 NAME:  Colleen Pearson, MRN:  [id number], DOB:  09-14-1990, LOS: 0 ADMISSION DATE:  04/22/2024, CONSULTATION DATE:  04/22/2024 REFERRING MD:  Oneil Budge, MD, CHIEF COMPLAINT:  Status Epilepticus   History of Present Illness:  33 y/o female with h/o Epilepsy and Polysubstance d/o who is a H6E9888 and is currently [redacted] weeks pregnant who was on the cough and then have several seizures.  EMS gave 4mg  IV Magnesium  and 5mg  IV Midazolam .  She was lethargic with secretions not able to protect her airways and she was intubated in the ED.  She has been started on Versed  drip.  She was loaded with Keppra .  There has been reports of non-compliance with her seizure meds-Keppra  and Lamictal .  She also had low levels as outpatient.  She has seizures in September as well, similar situation and apparently has a seizure two weeks prior to that ED visit.  Pertinent  Medical History  Epilepsy and Polysubstance d/o, CF carrier  Significant Hospital Events: Including procedures, antibiotic start and stop dates in addition to other pertinent events   11/1: transfer to [facility] from [facility]  Interim History / Subjective:  N/a  Objective    Blood pressure (!) 156/94, pulse (!) 154, temperature (!) 97.3 F (36.3 C), resp. rate (!) 35, height 5' 0.98 (1.549 m), SpO2 97%.    Vent Mode: PRVC FiO2 (%):  [100 %] 100 % Set Rate:  [20 bmp] 20 bmp Vt Set:  [400 mL-450 mL] 450 mL PEEP:  [8 cmH20] 8 cmH20  No intake or output data in the 24 hours ending 04/22/24 2235 There were no vitals filed for this visit.  Examination: General: *** HENT: *** Lungs: *** Cardiovascular: *** Abdomen: *** Extremities: *** Neuro: *** GU: ***  Resolved problem list   Assessment and Plan  Status Epilepticus Neurology on board Loaded with Keppra  and to maintain Compliance is main issue Continuous EEG monitoring Acute hypoxic respiratory failure On mech vent/vent management SAT/SBT when medically appropriate VAP  prevention Possible aspiration pneumonia vs pneumonitis Broad spectrum antibiotics Sputum for cx Third trimester pregnancy Will consult OB Has been evaluated by OB at [facility] and they feel fetus is stable/viable Polysubstance abuse Check UDS   Labs   CBC: Recent Labs  Lab 04/22/24 2033  WBC 20.9*  HGB 11.1*  HCT 34.5*  MCV 90.3  PLT 482*    Basic Metabolic Panel: Recent Labs  Lab 04/22/24 2033  NA 135  K 3.2*  CL 103  CO2 15*  GLUCOSE 273*  BUN 6  CREATININE 0.96  CALCIUM  8.3*   GFR: Estimated Creatinine Clearance: 72.6 mL/min (by C-G formula based on SCr of 0.96 mg/dL). Recent Labs  Lab 04/22/24 2033  WBC 20.9*    Liver Function Tests: Recent Labs  Lab 04/22/24 2033  AST 41  ALT 18  ALKPHOS 167*  BILITOT 0.5  PROT 6.7  ALBUMIN 2.9*   No results for input(s): LIPASE, AMYLASE in the last 168 hours. No results for input(s): AMMONIA  in the last 168 hours.  ABG    Component Value Date/Time   PHART 7.17 (LL) 04/22/2024 2200   PCO2ART 62 (H) 04/22/2024 2200   PO2ART 80 (L) 04/22/2024 2200   HCO3 22.6 04/22/2024 2200   TCO2 16 (L) 03/27/2024 2121   ACIDBASEDEF 6.8 (H) 04/22/2024 2200   O2SAT 96.6 04/22/2024 2200     Coagulation Profile: No results for input(s): INR, PROTIME in the last 168 hours.  Cardiac Enzymes: No results for input(s): CKTOTAL,  CKMB, CKMBINDEX, TROPONINI in the last 168 hours.  HbA1C: Hgb A1c MFr Bld  Date/Time Value Ref Range Status  01/11/2024 10:46 AM 4.8 4.8 - 5.6 % Final    Comment:             Prediabetes: 5.7 - 6.4          Diabetes: >6.4          Glycemic control for adults with diabetes: <7.0   02/22/2023 02:45 AM 5.0 4.8 - 5.6 % Final    Comment:    (NOTE) Pre diabetes:          5.7%-6.4%  Diabetes:              >6.4%  Glycemic control for   <7.0% adults with diabetes     CBG: Recent Labs  Lab 04/22/24 2105  GLUCAP 254*    Review of Systems:   intubated  Past Medical  History:  She,  has a past medical history of Anemia, Asthma, Cocaine use complicating pregnancy in third trimester (08/30/2017), Endometriosis, Nexplanon  in place (11/04/2020), Ovarian cyst, Seizures (HCC), and Status post cesarean section (08/30/2017).   Surgical History:   Past Surgical History:  Procedure Laterality Date   CESAREAN SECTION N/A 08/30/2017   Procedure: CESAREAN SECTION;  Surgeon: Leonce Garnette BIRCH, MD;  Location: [facility] ORS;  Service: Obstetrics;  Laterality: N/A;   LASER ABLATION/CAUTERIZATION OF ENDOMETRIAL IMPLANTS       Social History:   reports that she has quit smoking. Her smoking use included cigarettes and e-cigarettes. She has never used smokeless tobacco. She reports that she does not currently use drugs after having used the following drugs: Marijuana and Cocaine. She reports that she does not drink alcohol.   Family History:  Her family history includes Down syndrome in her sister.   Allergies Allergies  Allergen Reactions   Lamotrigine  Hives and Swelling    Pt is currently taking this medication   Penicillins Hives and Swelling    Tolerated Zosyn  02/24/23   Ultram [Tramadol] Hives and Swelling     Home Medications  Prior to Admission medications   Medication Sig Start Date End Date Taking? Authorizing Provider  ferrous sulfate  325 (65 FE) MG EC tablet Take 325 mg by mouth 2 (two) times daily before a meal.    [provider]  folic acid  (FOLVITE ) 1 MG tablet Take 1 tablet (1 mg total) by mouth daily. Patient not taking: Reported on 04/11/2024 01/11/24   Jayne Harlene CROME, CNM  lamoTRIgine  (LAMICTAL ) 200 MG tablet Take 1 tablet (200 mg total) by mouth 2 (two) times daily. 03/28/24   Cresenzo, Norleen GAILS, MD  levETIRAcetam  (KEPPRA ) 1000 MG tablet Take 1 tablet (1,000 mg total) by mouth 2 (two) times daily. 03/09/23 04/11/24  Love, Sharlet RAMAN, PA-C  ondansetron  (ZOFRAN -ODT) 4 MG disintegrating tablet Take 1 tablet (4 mg total) by mouth every 8 (eight)  hours as needed. 01/11/24   Jayne Harlene CROME, CNM  Prenatal Vit-Fe Fumarate-FA (PRENATAL MULTIVITAMIN) TABS tablet Take 1 tablet by mouth daily at 12 noon. 01/01/24   Viviann Pastor, MD     Critical care time: 78   The patient is critically ill with multiple organ system failure and requires high complexity decision making for assessment and support, frequent evaluation and titration of therapies, advanced monitoring, review of radiographic studies and interpretation of complex data.   Critical Care Time devoted to patient care services, exclusive of separately billable procedures, described in this note is  33 minutes.   Orlin Fairly, MD [facility] See Amion for pager  If no response to pager , please call [phone] until 7pm After 7:00 pm call Elink  [phone] 04/22/2024, 10:35 PM

## 2024-04-22 NOTE — Progress Notes (Signed)
   04/22/24 2100  Spiritual Encounters  Type of Visit Initial  Referral source Physician  Reason for visit Urgent spiritual support  OnCall Visit Yes   Doctor informed me of situation with patient and asked to be available if/when family arrives. Chaplain services available.

## 2024-04-22 NOTE — ED Provider Notes (Signed)
 [facility] Provider Note    Event Date/Time   First MD Initiated Contact with Patient 04/22/24 2030     (approximate)   History   Seizures   HPI {Remember to add pertinent medical, surgical, social, and/or OB history to HPI:1} Colleen Pearson is a 33 y.o. female  ***       Physical Exam   Triage Vital Signs: ED Triage Vitals  Encounter Vitals Group     BP 04/22/24 2029 (!) 153/85     Girls Systolic BP Percentile --      Girls Diastolic BP Percentile --      Boys Systolic BP Percentile --      Boys Diastolic BP Percentile --      Pulse Rate 04/22/24 2029 (!) 134     Resp 04/22/24 2029 (!) 35     Temp 04/22/24 2031 98.6 F (37 C)     Temp Source 04/22/24 2031 Rectal     SpO2 04/22/24 2122 95 %     Weight --      Height 04/22/24 2122 5' 0.98 (1.549 m)     Head Circumference --      Peak Flow --      Pain Score --      Pain Loc --      Pain Education --      Exclude from Growth Chart --     Most recent vital signs: Vitals:   04/22/24 2229 04/22/24 2235  BP:    Pulse:    Resp:    Temp:    SpO2: 97% 96%    {Only need to document appropriate and relevant physical exam:1} General: Awake, no distress. *** CV:  Good peripheral perfusion. *** Resp:  Normal effort. *** Abd:  No distention. *** Other:  ***   ED Results / Procedures / Treatments   Labs (all labs ordered are listed, but only abnormal results are displayed) Labs Reviewed  CBC - Abnormal; Notable for the following components:      Result Value   WBC 20.9 (*)    RBC 3.82 (*)    Hemoglobin 11.1 (*)    HCT 34.5 (*)    RDW 15.8 (*)    Platelets 482 (*)    All other components within normal limits  COMPREHENSIVE METABOLIC PANEL WITH GFR - Abnormal; Notable for the following components:   Potassium 3.2 (*)    CO2 15 (*)    Glucose, Bld 273 (*)    Calcium  8.3 (*)    Albumin 2.9 (*)    Alkaline Phosphatase 167 (*)    Anion gap 17 (*)    All other components  within normal limits  LIPID PANEL - Abnormal; Notable for the following components:   Cholesterol 236 (*)    Triglycerides 197 (*)    LDL Cholesterol 100 (*)    All other components within normal limits  BLOOD GAS, ARTERIAL - Abnormal; Notable for the following components:   pH, Arterial 7.17 (*)    pCO2 arterial 62 (*)    pO2, Arterial 80 (*)    Acid-base deficit 6.8 (*)    All other components within normal limits  CBG MONITORING, ED - Abnormal; Notable for the following components:   Glucose-Capillary 254 (*)    All other components within normal limits  CULTURE, BLOOD (ROUTINE X 2)  CULTURE, BLOOD (ROUTINE X 2)  TYPE AND SCREEN     EKG  ***   RADIOLOGY *** {  USE THE WORD INTERPRETED!! You MUST document your own interpretation of imaging, as well as the fact that you reviewed the radiologist's report!:1}   PROCEDURES:  Critical Care performed: {CriticalCareYesNo:19197::Yes, see critical care procedure note(s),No}  Procedures   MEDICATIONS ORDERED IN ED: Medications  etomidate  (AMIDATE ) 2 MG/ML injection (has no administration in time range)  midazolam  (VERSED ) 100 mg/100 mL (1 mg/mL) premix infusion (3 mg/hr Intravenous New Bag/Given 04/22/24 2128)  piperacillin -tazobactam (ZOSYN ) IVPB 3.375 g (has no administration in time range)  levETIRAcetam  (KEPPRA ) undiluted injection 4,000 mg (4,000 mg Intravenous Given 04/22/24 2102)  sodium chloride  0.9 % bolus 1,000 mL (1,000 mLs Intravenous New Bag/Given 04/22/24 2108)  etomidate  (AMIDATE ) injection 20 mg (20 mg Intravenous Given 04/22/24 2107)  rocuronium  (ZEMURON ) injection 70 mg (70 mg Intravenous Given 04/22/24 2108)  midazolam  PF (VERSED ) injection 4 mg (4 mg Intravenous Given 04/22/24 2059)     IMPRESSION / MDM / ASSESSMENT AND PLAN / ED COURSE  I reviewed the triage vital signs and the nursing notes.                              Differential diagnosis includes, but is not limited to, ***  Patient's  presentation is most consistent with {EM COPA:27473}  *** {If the patient is on the monitor, remove the brackets and asterisks on the sentence below and remember to document it as a Procedure as well. Otherwise delete the sentence below:1} {**The patient is on the cardiac monitor to evaluate for evidence of arrhythmia and/or significant heart rate changes.**} {Remember to include, when applicable, any/all of the following data: independent review of imaging independent review of labs (comment specifically on pertinent positives and negatives) review of specific prior hospitalizations, PCP/specialist notes, etc. discuss meds given and prescribed document any discussion with consultants (including hospitalists) any clinical decision tools you used and why (PECARN, NEXUS, etc.) did you consider admitting the patient? document social determinants of health affecting patient's care (homelessness, inability to follow up in a timely fashion, etc) document any pre-existing conditions increasing risk on current visit (e.g. diabetes and HTN increasing danger of high-risk chest pain/ACS) describes what meds you gave (especially parenteral) and why any other interventions?:1} Clinical Course as of 04/22/24 2258  Sat Apr 22, 2024  2253 Reviewed with pharmacy team, will provide Zosyn  for aspiration coverage.  Patient has a history of penicillin allergy documented from approximately 10 years ago, but pharmacy team able to verify patient has received and tolerated Zosyn  approximately 1 year ago [MQ]  2253 The patient is excepted in transfer ED to ED to [facility].  Anticipate neurology and OB consultations, NICU services in addition to obstetrical services as well as EEG monitoring are available at [facility] [MQ]  2253 Spoke with the patient's mother who identifies herself as the patient's closest family, she advises [MQ]  2254  agreeable with plan to transfer [MQ]    Clinical Course User Index [MQ] Dicky Anes, MD   ----------------------------------------- 10:55 PM on 04/22/2024 ----------------------------------------- The patient is accepted in transfer to [facility], ER to ER.  I spoke with Dr. Loreli of critical care medicine, they do not have an immediately available bed and due to this need for condition including continuous EEG monitoring which is not immediately available here for patient with concerns of status epilepticus and also pregnant, we will transfer her to higher level facility [facility].  Accepted by Dr. Ruthe at [facility]  ER at this time.  Vitals:   04/22/24 2229 04/22/24 2235  BP:    Pulse:    Resp:    Temp:    SpO2: 97% 96%   OB services has evaluated and advises that the patient is of appropriate cervical station with no evidence of eminent delivery though occasional contractile activity is present on toco monitoring.  There is no evidence of precipitous birth vaginal fluid leakage or signs that delivery is imminent at this time.  The risks and benefits being weighed benefit and need for emergency EEG monitoring for concerns of potential status epilepticus outweigh the risks at this point.    FINAL CLINICAL IMPRESSION(S) / ED DIAGNOSES   Final diagnoses:  Status epilepticus (HCC)  Aspiration pneumonia of both lungs, unspecified aspiration pneumonia type, unspecified part of lung (HCC)  Third trimester pregnancy at less than 36 weeks  Acute respiratory failure, unspecified whether with hypoxia or hypercapnia (HCC)     Rx / DC Orders   ED Discharge Orders     None        Note:  This document was prepared using Dragon voice recognition software and may include unintentional dictation errors.

## 2024-04-22 NOTE — Progress Notes (Signed)
 ED Pharmacy Antibiotic Sign Off An antibiotic consult was received from an ED provider for Zosyn  per pharmacy dosing for aspiration pneumonia. A chart review was completed to assess appropriateness.   The following one time order(s) were placed: Zosyn  3.375 gm   Further antibiotic and/or antibiotic pharmacy consults should be ordered by the admitting provider if indicated.   Thank you for allowing pharmacy to be a part of this patient's care.   Thank you, Rankin CANDIE Dills, PharmD, [facility] 04/22/2024 10:57 PM

## 2024-04-22 NOTE — Progress Notes (Signed)
 Pt received continuous fetal monitoring while in the Emergency room. Patient in critical condition refer to ED note. CNM Reviewed tracing. Patient transported VIA care link to [facility].   04/22/24 2300  Vital Signs  BP (!) 154/90  MAP (mmHg) 107  Pulse Rate (!) 152  ECG Heart Rate (!) 151  Temp (!) 97.4 F (36.3 C)  Oxygen Therapy  SpO2 100 %  Fetal Heart Rate A  Mode External  Baseline Rate (A) 145 bpm  Variability 6-25 BPM  Accelerations None  Decelerations None  Uterine Activity  Mode Toco  Contraction Frequency (min) 2-3  Contraction Duration (sec) 50-70  Contraction Quality Mild  Resting Tone Palpated Relaxed  Resting Time Adequate

## 2024-04-22 NOTE — Consult Note (Signed)
 Consult History and Physical   SERVICE: Obstetrics  Patient Name: JAHLISA ROSSITTO [facility] Patient MRN:   [id number]  CC: Status epilepticus  HPI: DALIANA LEVERETT is a 33 y.o. [id number] who is being seen in the ED in status epilepticus. She has a history of a known seizure disorder, and this is her third seizure this pregnancy. I am being consulted for evaluation of fetal wellbeing and to r/o imminent delivery. She has received IV magnesium  4gm and 5mg  IV midazolam  so far. The ED team is preparing for intubation to secure her airway.   Review of Systems: positives in bold Unable to obtain as patient is in status epilepticus.  Past Obstetrical History: OB History     Gravida  3   Para  1   Term  0   Preterm  1   AB  1   Living  1      SAB  0   IAB  1   Ectopic      Multiple  0   Live Births  1           Past Obstetric History: G1: IAB G2: 08/2017 LTCS at 36+2 wks. Indication: maternal request with patient in labor. G3: Current  Past Medical History: Past Medical History:  Diagnosis Date   Anemia    Asthma    Cocaine use complicating pregnancy in third trimester 08/30/2017   Endometriosis    Nexplanon  in place 11/04/2020   Ovarian cyst    Seizures (HCC)    Status post cesarean section 08/30/2017    Past Surgical History:   Past Surgical History:  Procedure Laterality Date   CESAREAN SECTION N/A 08/30/2017   Procedure: CESAREAN SECTION;  Surgeon: Leonce Garnette BIRCH, MD;  Location: [facility] ORS;  Service: Obstetrics;  Laterality: N/A;   LASER ABLATION/CAUTERIZATION OF ENDOMETRIAL IMPLANTS      Family History:  family history includes Down syndrome in her sister.  Social History:  Social History   Socioeconomic History   Marital status: Significant Other    Spouse name: Not on file   Number of children: 1   Years of education: Not on file   Highest education level: 9th grade  Occupational History   Not on file  Tobacco Use   Smoking status:  Former    Current packs/day: 0.25    Types: Cigarettes, E-cigarettes   Smokeless tobacco: Never   Tobacco comments:    Cutting down, declines nicotine  replacement  Vaping Use   Vaping status: Some Days   Substances: Nicotine , Flavoring  Substance and Sexual Activity   Alcohol use: No   Drug use: Not Currently    Types: Marijuana, Cocaine    Comment: smoked marijuana july 2025 no longer cocaine   Sexual activity: Yes  Other Topics Concern   Not on file  Social History Narrative   Not on file   Social Drivers of Health   Financial Resource Strain: Medium Risk (12/27/2023)   Overall Financial Resource Strain (CARDIA)    Difficulty of Paying Living Expenses: Somewhat hard  Food Insecurity: No Food Insecurity (12/27/2023)   Hunger Vital Sign    Worried About Running Out of Food in the Last Year: Never true    Ran Out of Food in the Last Year: Never true  Transportation Needs: No Transportation Needs (12/27/2023)   PRAPARE - Administrator, Civil Service (Medical): No    Lack of Transportation (Non-Medical): No  Physical Activity: Inactive (12/27/2023)   Exercise  Vital Sign    Days of Exercise per Week: 0 days    Minutes of Exercise per Session: 0 min  Stress: No Stress Concern Present (12/27/2023)   [employer] of Occupational Health - Occupational Stress Questionnaire    Feeling of Stress: Not at all  Social Connections: Unknown (12/27/2023)   Social Connection and Isolation Panel    Frequency of Communication with Friends and Family: Once a week    Frequency of Social Gatherings with Friends and Family: Once a week    Attends Religious Services: Never    Database Administrator or Organizations: No    Attends Banker Meetings: Never    Marital Status: Patient unable to answer  Intimate Partner Violence: Not At Risk (12/27/2023)   Humiliation, Afraid, Rape, and Kick questionnaire    Fear of Current or Ex-Partner: No    Emotionally Abused: No     Physically Abused: No    Sexually Abused: No    Home Medications:  Medications reconciled in EPIC  No current facility-administered medications on file prior to encounter.   Current Outpatient Medications on File Prior to Encounter  Medication Sig Dispense Refill   ferrous sulfate  325 (65 FE) MG EC tablet Take 325 mg by mouth 2 (two) times daily before a meal.     folic acid  (FOLVITE ) 1 MG tablet Take 1 tablet (1 mg total) by mouth daily. (Patient not taking: Reported on 04/11/2024) 30 tablet 10   lamoTRIgine  (LAMICTAL ) 200 MG tablet Take 1 tablet (200 mg total) by mouth 2 (two) times daily. 60 tablet 3   levETIRAcetam  (KEPPRA ) 1000 MG tablet Take 1 tablet (1,000 mg total) by mouth 2 (two) times daily. 60 tablet 0   ondansetron  (ZOFRAN -ODT) 4 MG disintegrating tablet Take 1 tablet (4 mg total) by mouth every 8 (eight) hours as needed. 30 tablet 1   Prenatal Vit-Fe Fumarate-FA (PRENATAL MULTIVITAMIN) TABS tablet Take 1 tablet by mouth daily at 12 noon. 90 tablet 3    Allergies:  Allergies  Allergen Reactions   Lamotrigine  Hives and Swelling    Pt is currently taking this medication   Penicillins Hives and Swelling    Tolerated Zosyn  02/24/23   Ultram [Tramadol] Hives and Swelling    Physical Exam:  Temp:  [98.6 F (37 C)] 98.6 F (37 C) (11/01 2031) Pulse Rate:  [134] 134 (11/01 2029) Resp:  [35] 35 (11/01 2029) BP: (153)/(85) 153/85 (11/01 2029)   General Appearance:  Post ictal, pale Cardiovascular:  Tachycardic 150's Pulmonary:  Irregular respirations Abdomen:  Gravid, soft SVE: FT/ th/ hi  FHT: 140's with minimal variability, no accels or decels Toco: +irritability  Labs/Studies:   CBC and Coags:  Lab Results  Component Value Date   WBC 20.9 (H) 04/22/2024   NEUTOPHILPCT 79 03/28/2024   EOSPCT 3 03/27/2024   BASOPCT 0 03/27/2024   LYMPHOPCT 20 03/27/2024   HGB 11.1 (L) 04/22/2024   HCT 34.5 (L) 04/22/2024   MCV 90.3 04/22/2024   PLT 482 (H) 04/22/2024    CMP:  Lab Results  Component Value Date   NA 135 04/22/2024   K 3.2 (L) 04/22/2024   CL 103 04/22/2024   CO2 15 (L) 04/22/2024   BUN 6 04/22/2024   CREATININE 0.96 04/22/2024   CREATININE 0.70 03/27/2024   CREATININE 0.74 03/27/2024   PROT 6.7 04/22/2024   BILITOT 0.5 04/22/2024   ALT 18 04/22/2024   AST 41 04/22/2024   ALKPHOS 167 (H) 04/22/2024  Other Imaging: DG Chest Portable 1 View Result Date: 04/22/2024 EXAM: 1 VIEW(S) XRAY OF THE CHEST 04/22/2024 09:25:00 PM COMPARISON: Portable chest 02/27/2023. CLINICAL HISTORY: Patient is 7 months pregnant with multiple seizures and ventilator-dependent respiratory failure. Check intubation. FINDINGS: LINES, TUBES AND DEVICES: Endotracheal tube tip is 2.3 cm from the carina with nasogastric tube coiled in the stomach. LUNGS AND PLEURA: Interstitial edema was present on the prior study but  is not seen today. There are patchy perihilar opacities and underlying central bronchial thickening, findings most likely indicating bilateral bronchopneumonia. The more peripheral lungs are clear. No pleural effusion is seen. No pneumothorax. HEART AND MEDIASTINUM: The cardiomediastinal silhouette and vascular markings are normal. No acute abnormality of the cardiac and mediastinal silhouettes. BONES AND SOFT TISSUES: Thoracic cage is intact. No acute osseous abnormality. IMPRESSION: 1. Endotracheal tube tip 2.3 cm above the carina. Nasogastric tube coiled within the stomach. 2. Patchy perihilar opacities with central bronchial thickening, consistent with bilateral bronchopneumonia. 3. No pleural effusion. Electronically signed by: Francis Quam MD 04/22/2024 09:39 PM EDT RP Workstation: [id number]   US  MFM OB FOLLOW UP Result Date: 04/11/2024 ----------------------------------------------------------------------  OBSTETRICS REPORT                       (Signed Final 04/11/2024 01:39 pm) ----------------------------------------------------------------------  Patient Info  ID #:       [id number]                          D.O.B.:  [birth year] (33 yrs)(F)  Name:       VIRGILENE STRYKER [facility]              Visit Date: 04/11/2024 07:44 am ---------------------------------------------------------------------- Performed By  Attending:        Delora Smaller DO       Referred By:      Belle SHIPPER  Performed By:     Rumaldo Sharps RDMS      [facility] ---------------------------------------------------------------------- Orders  #  Description                           Code        Ordered By  1  US  MFM OB FOLLOW UP  23183.98    NATHANEL FETTERS ----------------------------------------------------------------------  #  Order #                     Accession #                Episode #  1  [id number]                   [id number]                 [id number] ---------------------------------------------------------------------- Indications  Seizure disorder (Keppra  and Lamictal )         O99.350 G40.909  Drug use complicating pregnancy, third         O99.323  trimester  Cystic Fibrosis (CF) Carrier, third trimester  O09.893  Previous cesarean delivery                     O34.219  Poor obstetric history: Previous preterm       O09.219  delivery (36+2 weeks)  Low risk NIPS - NEG AFP  ?GTT  Encounter for other antenatal screening        Z36.2  follow-up  [redacted] weeks gestation of pregnancy                Z3A.30 ---------------------------------------------------------------------- Vital Signs  BP:          128/57 ---------------------------------------------------------------------- Fetal Evaluation  Num Of Fetuses:         1  Fetal Heart Rate(bpm):  148  Cardiac Activity:       Observed   Presentation:           Cephalic  Placenta:               Anterior  P. Cord Insertion:      Previously seen  Amniotic Fluid  AFI FV:      Within normal limits  AFI Sum(cm)     %Tile       Largest Pocket(cm)  15.67           56          4.69  RUQ(cm)       RLQ(cm)       LUQ(cm)        LLQ(cm)  3.97          4.38          4.69           2.63 ---------------------------------------------------------------------- Biometry  BPD:      77.3  mm     G. Age:  31w 0d         61  %    CI:        75.83   %    70 - 86                                                          FL/HC:      19.2   %  19.2 - 21.4  HC:      281.4  mm     G. Age:  30w 6d         29  %    HC/AC:      1.08        0.99 - 1.21  AC:      260.1  mm     G. Age:  30w 1d         41  %    FL/BPD:     69.9   %    71 - 87  FL:         54  mm     G. Age:  28w 4d        4.6  %    FL/AC:      20.8   %    20 - 24  LV:        5.5  mm  Est. FW:    1454  gm      3 lb 3 oz     22  % ---------------------------------------------------------------------- OB History  Gravidity:    4         Term:   0        Prem:   1        SAB:   1  TOP:          1       Ectopic:  0        Living: 1 ---------------------------------------------------------------------- Gestational Age  U/S Today:     30w 1d                                        EDD:   06/19/24  Best:          30w 2d     Det. By:  Early Ultrasound         EDD:   06/18/24                                      (12/17/23) ---------------------------------------------------------------------- Anatomy  Cranium:               Previously seen        Aortic Arch:            Previously seen  Cavum:                 Previously seen        Ductal Arch:            Previously seen  Ventricles:            Appears normal         Diaphragm:              Appears normal  Choroid Plexus:        Previously seen        Stomach:                Appears normal, left  sided   Cerebellum:            Previously seen        Abdomen:                Previously seen  Posterior Fossa:       Previously seen        Abdominal Wall:         Previously seen  Face:                  Orbits and profile     Cord Vessels:           Previously seen                         previously seen  Lips:                  Previously seen        Kidneys:                Appear normal  Thoracic:              Previously seen        Bladder:                Appears normal  Heart:                 Appears normal         Spine:                  Previously seen                         (4CH, axis, and                         situs)  RVOT:                  Previously seen        Upper Extremities:      Previously seen  LVOT:                  Previously seen        Lower Extremities:      Previously seen ---------------------------------------------------------------------- Cervix Uterus Adnexa  Cervix  Not visualized (advanced GA >24wks)  Uterus  No abnormality visualized.  Right Ovary  Within normal limits.  Left Ovary  Within normal limits.  Cul De Sac  No free fluid seen.  Adnexa  No abnormality visualized ---------------------------------------------------------------------- Comments  Maternal Fetal medicine Consult  KAMRYNN MELOTT is a 33 y.o. H6E9888 at [redacted]w[redacted]d here  for ultrasound and consultation. Shanee J Cara is  doing well today with no acute concerns. Today we focused  on the following:  Seizure disorder:  Last known seizure 03/27/24 and  Keppra /Lamictal  levels undetectable at that time, pt states  has been taking . Drug levels are pending. Her last Lamictal   and Keppra  levels were on 10/6 and was low.  She was told  by neurology to increase her Lamictal  to 200 mg twice daily.  She has done this and has not experienced any more  seizures.  We discussed delivery timing is typically at 39  weeks unless she has recurrent breakthrough seizures.  However, she should have weekly natal testing starting at 32  weeks  given her increased seizure activity.  This can be done  at the [facility] providers office.  Hx of CD: RCD scheduled for 12/22 at 39w  The patient had time to ask questions that were answered to  her satisfaction.  She verbalized understanding and agrees to  proceed with the plan below.  Sonographic findings  Single intrauterine pregnancy at 30w 2d.  Fetal cardiac activity:  Observed and appears normal.  Presentation: Cephalic.  Interval fetal anatomy appears normal.  Fetal biometry shows the estimated fetal weight at the 22  percentile.  Amniotic fluid volume: Within normal limits. MVP: 4.69 cm.  Placenta: Anterior.  There are limitations of prenatal ultrasound such as the  inability to detect certain abnormalities due to poor  visualization. Various factors such as fetal position,  gestational age and maternal body habitus may increase the  difficulty in visualizing the fetal anatomy.  Recommendations  -Serial growth ultrasounds every 4-6 weeks until delivery  - Please schedule weekly antenatal testing at the [facility]  provider's office  - Repeat cesarean delivery on 12/22 ----------------------------------------------------------------------                   Delora Smaller, DO Electronically Signed Final Report   04/11/2024 01:39 pm ----------------------------------------------------------------------   CT HEAD WO CONTRAST ( ) Result Date: 03/27/2024 CLINICAL DATA:  Head trauma, moderate-severe; Neck trauma, dangerous injury mechanism (Age 9-64y); Facial trauma, blunt EXAM: CT HEAD WITHOUT CONTRAST CT MAXILLOFACIAL WITHOUT CONTRAST CT CERVICAL SPINE WITHOUT CONTRAST TECHNIQUE: Multidetector CT imaging of the head, cervical spine, and maxillofacial structures were performed using the standard protocol without intravenous contrast. Multiplanar CT image reconstructions of the cervical spine and maxillofacial structures were also generated. RADIATION DOSE REDUCTION: This exam was performed according to the departmental  dose-optimization program which includes automated exposure control, adjustment of the mA and/or kV according to patient size and/or use of iterative reconstruction technique. COMPARISON:  None Available. FINDINGS: CT HEAD FINDINGS Brain: No evidence of large-territorial acute infarction. Prominent symmetric bilateral basal ganglia mineralization that are nonspecific and benign. No parenchymal hemorrhage. No mass lesion. No extra-axial collection. No mass effect or midline shift. No hydrocephalus. Basilar cisterns are patent. Vascular: No hyperdense vessel. Skull: No acute fracture or focal lesion. Other: None. CT MAXILLOFACIAL FINDINGS Osseous: No fracture or mandibular dislocation. No destructive process. Sinuses/Orbits: Paranasal sinuses and mastoid air cells are clear. The orbits are unremarkable. Soft tissues: Negative. CT CERVICAL SPINE FINDINGS Alignment: Reversal of the normal cervical lordosis likely due to positioning. Skull base and vertebrae: No acute fracture. No aggressive appearing focal osseous lesion or focal pathologic process. Soft tissues and spinal canal: No prevertebral fluid or swelling. No visible canal hematoma. Upper chest: Unremarkable. Other: None. IMPRESSION: 1. No acute intracranial abnormality. 2.  No acute displaced facial fracture. 3. No acute displaced fracture or traumatic listhesis of the cervical spine. Electronically Signed   By: Morgane  Naveau M.D.   On: 03/27/2024 22:08   CT Maxillofacial Wo Contrast Result Date: 03/27/2024 CLINICAL DATA:  Head trauma, moderate-severe; Neck trauma, dangerous injury mechanism (Age 92-64y); Facial trauma, blunt EXAM: CT HEAD WITHOUT CONTRAST CT MAXILLOFACIAL WITHOUT CONTRAST CT CERVICAL SPINE WITHOUT CONTRAST TECHNIQUE: Multidetector CT imaging of the head, cervical spine, and maxillofacial structures were performed using the standard protocol without intravenous contrast. Multiplanar CT image reconstructions of the cervical spine and  maxillofacial structures were also generated. RADIATION DOSE REDUCTION: This exam was performed according to the departmental dose-optimization program which includes automated exposure control, adjustment of the mA and/or kV according to patient size and/or use of iterative reconstruction  technique. COMPARISON:  None Available. FINDINGS: CT HEAD FINDINGS Brain: No evidence of large-territorial acute infarction. Prominent symmetric bilateral basal ganglia mineralization that are nonspecific and benign. No parenchymal hemorrhage. No mass lesion. No extra-axial collection. No mass effect or midline shift. No hydrocephalus. Basilar cisterns are patent. Vascular: No hyperdense vessel. Skull: No acute fracture or focal lesion. Other: None. CT MAXILLOFACIAL FINDINGS Osseous: No fracture or mandibular dislocation. No destructive process. Sinuses/Orbits: Paranasal sinuses and mastoid air cells are clear. The orbits are unremarkable. Soft tissues: Negative. CT CERVICAL SPINE FINDINGS Alignment: Reversal of the normal cervical lordosis likely due to positioning. Skull base and vertebrae: No acute fracture. No aggressive appearing focal osseous lesion or focal pathologic process. Soft tissues and spinal canal: No prevertebral fluid or swelling. No visible canal hematoma. Upper chest: Unremarkable. Other: None. IMPRESSION: 1. No acute intracranial abnormality. 2.  No acute displaced facial fracture. 3. No acute displaced fracture or traumatic listhesis of the cervical spine. Electronically Signed   By: Morgane  Naveau M.D.   On: 03/27/2024 22:08   CT Cervical Spine Wo Contrast Result Date: 03/27/2024 CLINICAL DATA:  Head trauma, moderate-severe; Neck trauma, dangerous injury mechanism (Age 85-64y); Facial trauma, blunt EXAM: CT HEAD WITHOUT CONTRAST CT MAXILLOFACIAL WITHOUT CONTRAST CT CERVICAL SPINE WITHOUT CONTRAST TECHNIQUE: Multidetector CT imaging of the head, cervical spine, and maxillofacial structures were performed  using the standard protocol without intravenous contrast. Multiplanar CT image reconstructions of the cervical spine and maxillofacial structures were also generated. RADIATION DOSE REDUCTION: This exam was performed according to the departmental dose-optimization program which includes automated exposure control, adjustment of the mA and/or kV according to patient size and/or use of iterative reconstruction technique. COMPARISON:  None Available. FINDINGS: CT HEAD FINDINGS Brain: No evidence of large-territorial acute infarction. Prominent symmetric bilateral basal ganglia mineralization that are nonspecific and benign. No parenchymal hemorrhage. No mass lesion. No extra-axial collection. No mass effect or midline shift. No hydrocephalus. Basilar cisterns are patent. Vascular: No hyperdense vessel. Skull: No acute fracture or focal lesion. Other: None. CT MAXILLOFACIAL FINDINGS Osseous: No fracture or mandibular dislocation. No destructive process. Sinuses/Orbits: Paranasal sinuses and mastoid air cells are clear. The orbits are unremarkable. Soft tissues: Negative. CT CERVICAL SPINE FINDINGS Alignment: Reversal of the normal cervical lordosis likely due to positioning. Skull base and vertebrae: No acute fracture. No aggressive appearing focal osseous lesion or focal pathologic process. Soft tissues and spinal canal: No prevertebral fluid or swelling. No visible canal hematoma. Upper chest: Unremarkable. Other: None. IMPRESSION: 1. No acute intracranial abnormality. 2.  No acute displaced facial fracture. 3. No acute displaced fracture or traumatic listhesis of the cervical spine. Electronically Signed   By: Morgane  Naveau M.D.   On: 03/27/2024 22:08     Assessment / Plan:   MARILYNE HASELEY is a 33 y.o. H6E9888 who presents to the ER in status epilepticus.  1. Stable fetus without indication for delivery at this time. Will perform extended fetal monitoring for at least an hour. Stable FHR with minimal  variability is reasonable at this time given maternal medications that are on board.  2. Not in labor. 3. Hx LTCS and planning for ERCS at 39 wks. 4. Patient with known seizure disorder. Doubt preeclampsia, but did receive 4gm IV magnesium . 5. Dr. Elspeth Mace is OB on call. I have consulted with him and he is in agreement with OB plan of care.   Thank you for the opportunity to be involved with this patient's care.  ----- Lauraine  Charma HOWARD [facility]

## 2024-04-22 NOTE — Procedures (Signed)
 History: 33 yo F intubated with seizures, r/o satus epilepticus  EEG Duration: 30 minutes  Sedation: versed   Patient State: asleep  Technique: This EEG was obtained using a 10 lead EEG system positioned circumferentially without any parasagittal coverage (rapid EEG). Computer selected EEG is reviewed as well as background features and all clinically significant events.    Background: The background consists of   Photic stimulation: Physiologic driving is ***  EEG Abnormalities: ***  Clinical Interpretation: This normal EEG is recorded in the waking and *** state. There was no seizure or seizure predisposition recorded on this study. Please note that lack of epileptiform activity on EEG does not preclude the possibility of epilepsy.   Aisha Seals, MD [facility]   If 7pm- 7am, please page neurology on call as listed in AMION.

## 2024-04-23 ENCOUNTER — Inpatient Hospital Stay (HOSPITAL_COMMUNITY)

## 2024-04-23 ENCOUNTER — Inpatient Hospital Stay (HOSPITAL_COMMUNITY): Admitting: Anesthesiology

## 2024-04-23 ENCOUNTER — Other Ambulatory Visit: Payer: Self-pay

## 2024-04-23 ENCOUNTER — Encounter (HOSPITAL_COMMUNITY)
Admission: EM | Discharge status: Against Medical Advice | Payer: Self-pay | Source: Other Acute Inpatient Hospital | Attending: Emergency Medicine

## 2024-04-23 ENCOUNTER — Encounter (HOSPITAL_COMMUNITY): Payer: Self-pay | Admitting: *Deleted

## 2024-04-23 ENCOUNTER — Inpatient Hospital Stay (HOSPITAL_COMMUNITY)
Admission: EM | Admit: 2024-04-23 | Discharge: 2024-05-08 | DRG: 786 | Discharge status: Against Medical Advice | Source: Other Acute Inpatient Hospital | Attending: Internal Medicine | Admitting: Internal Medicine

## 2024-04-23 ENCOUNTER — Emergency Department (HOSPITAL_BASED_OUTPATIENT_CLINIC_OR_DEPARTMENT_OTHER)

## 2024-04-23 DIAGNOSIS — O9952 Diseases of the respiratory system complicating childbirth: Secondary | ICD-10-CM | POA: Diagnosis present

## 2024-04-23 DIAGNOSIS — F1290 Cannabis use, unspecified, uncomplicated: Secondary | ICD-10-CM | POA: Diagnosis not present

## 2024-04-23 DIAGNOSIS — N179 Acute kidney failure, unspecified: Secondary | ICD-10-CM | POA: Diagnosis not present

## 2024-04-23 DIAGNOSIS — E874 Mixed disorder of acid-base balance: Secondary | ICD-10-CM | POA: Diagnosis present

## 2024-04-23 DIAGNOSIS — Z98891 History of uterine scar from previous surgery: Secondary | ICD-10-CM | POA: Diagnosis not present

## 2024-04-23 DIAGNOSIS — O99355 Diseases of the nervous system complicating the puerperium: Secondary | ICD-10-CM | POA: Diagnosis present

## 2024-04-23 DIAGNOSIS — O0990 Supervision of high risk pregnancy, unspecified, unspecified trimester: Secondary | ICD-10-CM

## 2024-04-23 DIAGNOSIS — O09213 Supervision of pregnancy with history of pre-term labor, third trimester: Secondary | ICD-10-CM

## 2024-04-23 DIAGNOSIS — T40415A Adverse effect of fentanyl or fentanyl analogs, initial encounter: Secondary | ICD-10-CM | POA: Diagnosis not present

## 2024-04-23 DIAGNOSIS — Z8616 Personal history of COVID-19: Secondary | ICD-10-CM

## 2024-04-23 DIAGNOSIS — O34219 Maternal care for unspecified type scar from previous cesarean delivery: Secondary | ICD-10-CM

## 2024-04-23 DIAGNOSIS — G40909 Epilepsy, unspecified, not intractable, without status epilepticus: Secondary | ICD-10-CM

## 2024-04-23 DIAGNOSIS — F17210 Nicotine dependence, cigarettes, uncomplicated: Secondary | ICD-10-CM | POA: Diagnosis present

## 2024-04-23 DIAGNOSIS — Z91148 Patient's other noncompliance with medication regimen for other reason: Secondary | ICD-10-CM

## 2024-04-23 DIAGNOSIS — O9963 Diseases of the digestive system complicating the puerperium: Secondary | ICD-10-CM | POA: Diagnosis not present

## 2024-04-23 DIAGNOSIS — O99354 Diseases of the nervous system complicating childbirth: Secondary | ICD-10-CM | POA: Diagnosis present

## 2024-04-23 DIAGNOSIS — Z885 Allergy status to narcotic agent status: Secondary | ICD-10-CM

## 2024-04-23 DIAGNOSIS — Z880 Allergy status to penicillin: Secondary | ICD-10-CM

## 2024-04-23 DIAGNOSIS — R569 Unspecified convulsions: Secondary | ICD-10-CM

## 2024-04-23 DIAGNOSIS — Z8279 Family history of other congenital malformations, deformations and chromosomal abnormalities: Secondary | ICD-10-CM

## 2024-04-23 DIAGNOSIS — O9049 Other postpartum acute kidney failure: Secondary | ICD-10-CM | POA: Diagnosis not present

## 2024-04-23 DIAGNOSIS — Z781 Physical restraint status: Secondary | ICD-10-CM

## 2024-04-23 DIAGNOSIS — G929 Unspecified toxic encephalopathy: Secondary | ICD-10-CM | POA: Diagnosis not present

## 2024-04-23 DIAGNOSIS — F1410 Cocaine abuse, uncomplicated: Secondary | ICD-10-CM | POA: Diagnosis present

## 2024-04-23 DIAGNOSIS — Z9181 History of falling: Secondary | ICD-10-CM

## 2024-04-23 DIAGNOSIS — J80 Acute respiratory distress syndrome: Secondary | ICD-10-CM

## 2024-04-23 DIAGNOSIS — O99334 Smoking (tobacco) complicating childbirth: Secondary | ICD-10-CM | POA: Diagnosis present

## 2024-04-23 DIAGNOSIS — Z141 Cystic fibrosis carrier: Secondary | ICD-10-CM | POA: Diagnosis not present

## 2024-04-23 DIAGNOSIS — J9601 Acute respiratory failure with hypoxia: Secondary | ICD-10-CM | POA: Diagnosis present

## 2024-04-23 DIAGNOSIS — O99284 Endocrine, nutritional and metabolic diseases complicating childbirth: Secondary | ICD-10-CM | POA: Diagnosis present

## 2024-04-23 DIAGNOSIS — J9602 Acute respiratory failure with hypercapnia: Secondary | ICD-10-CM | POA: Diagnosis present

## 2024-04-23 DIAGNOSIS — R5381 Other malaise: Secondary | ICD-10-CM | POA: Diagnosis present

## 2024-04-23 DIAGNOSIS — D72829 Elevated white blood cell count, unspecified: Secondary | ICD-10-CM | POA: Diagnosis not present

## 2024-04-23 DIAGNOSIS — R000 Tachycardia, unspecified: Secondary | ICD-10-CM | POA: Diagnosis not present

## 2024-04-23 DIAGNOSIS — E870 Hyperosmolality and hypernatremia: Secondary | ICD-10-CM | POA: Diagnosis not present

## 2024-04-23 DIAGNOSIS — D75838 Other thrombocytosis: Secondary | ICD-10-CM | POA: Diagnosis not present

## 2024-04-23 DIAGNOSIS — Z3A33 33 weeks gestation of pregnancy: Secondary | ICD-10-CM | POA: Diagnosis not present

## 2024-04-23 DIAGNOSIS — F1490 Cocaine use, unspecified, uncomplicated: Secondary | ICD-10-CM | POA: Diagnosis not present

## 2024-04-23 DIAGNOSIS — O34211 Maternal care for low transverse scar from previous cesarean delivery: Secondary | ICD-10-CM | POA: Diagnosis present

## 2024-04-23 DIAGNOSIS — O99324 Drug use complicating childbirth: Secondary | ICD-10-CM | POA: Diagnosis present

## 2024-04-23 DIAGNOSIS — O4593 Premature separation of placenta, unspecified, third trimester: Secondary | ICD-10-CM | POA: Diagnosis not present

## 2024-04-23 DIAGNOSIS — J690 Pneumonitis due to inhalation of food and vomit: Secondary | ICD-10-CM | POA: Diagnosis present

## 2024-04-23 DIAGNOSIS — E785 Hyperlipidemia, unspecified: Secondary | ICD-10-CM | POA: Diagnosis present

## 2024-04-23 DIAGNOSIS — Z5329 Procedure and treatment not carried out because of patient's decision for other reasons: Secondary | ICD-10-CM | POA: Diagnosis present

## 2024-04-23 DIAGNOSIS — R0902 Hypoxemia: Secondary | ICD-10-CM | POA: Diagnosis not present

## 2024-04-23 DIAGNOSIS — R578 Other shock: Secondary | ICD-10-CM | POA: Diagnosis not present

## 2024-04-23 DIAGNOSIS — Z888 Allergy status to other drugs, medicaments and biological substances status: Secondary | ICD-10-CM

## 2024-04-23 DIAGNOSIS — I959 Hypotension, unspecified: Secondary | ICD-10-CM | POA: Diagnosis not present

## 2024-04-23 DIAGNOSIS — O99353 Diseases of the nervous system complicating pregnancy, third trimester: Secondary | ICD-10-CM | POA: Diagnosis not present

## 2024-04-23 DIAGNOSIS — R1310 Dysphagia, unspecified: Secondary | ICD-10-CM | POA: Diagnosis not present

## 2024-04-23 DIAGNOSIS — Z79899 Other long term (current) drug therapy: Secondary | ICD-10-CM

## 2024-04-23 DIAGNOSIS — F1990 Other psychoactive substance use, unspecified, uncomplicated: Secondary | ICD-10-CM | POA: Diagnosis present

## 2024-04-23 DIAGNOSIS — F1210 Cannabis abuse, uncomplicated: Secondary | ICD-10-CM | POA: Diagnosis present

## 2024-04-23 DIAGNOSIS — D62 Acute posthemorrhagic anemia: Secondary | ICD-10-CM | POA: Diagnosis not present

## 2024-04-23 DIAGNOSIS — E876 Hypokalemia: Secondary | ICD-10-CM | POA: Diagnosis not present

## 2024-04-23 DIAGNOSIS — G9349 Other encephalopathy: Secondary | ICD-10-CM | POA: Diagnosis not present

## 2024-04-23 DIAGNOSIS — Z3A32 32 weeks gestation of pregnancy: Secondary | ICD-10-CM

## 2024-04-23 DIAGNOSIS — G40901 Epilepsy, unspecified, not intractable, with status epilepticus: Principal | ICD-10-CM | POA: Diagnosis present

## 2024-04-23 DIAGNOSIS — O99285 Endocrine, nutritional and metabolic diseases complicating the puerperium: Secondary | ICD-10-CM | POA: Diagnosis not present

## 2024-04-23 DIAGNOSIS — Z59868 Other specified financial insecurity: Secondary | ICD-10-CM

## 2024-04-23 DIAGNOSIS — F17290 Nicotine dependence, other tobacco product, uncomplicated: Secondary | ICD-10-CM | POA: Diagnosis present

## 2024-04-23 DIAGNOSIS — R0689 Other abnormalities of breathing: Secondary | ICD-10-CM | POA: Diagnosis not present

## 2024-04-23 DIAGNOSIS — T426X6A Underdosing of other antiepileptic and sedative-hypnotic drugs, initial encounter: Secondary | ICD-10-CM | POA: Diagnosis present

## 2024-04-23 DIAGNOSIS — Z8701 Personal history of pneumonia (recurrent): Secondary | ICD-10-CM

## 2024-04-23 DIAGNOSIS — F1413 Cocaine abuse, unspecified with withdrawal: Secondary | ICD-10-CM | POA: Diagnosis not present

## 2024-04-23 DIAGNOSIS — Z7401 Bed confinement status: Secondary | ICD-10-CM | POA: Diagnosis not present

## 2024-04-23 DIAGNOSIS — Z9889 Other specified postprocedural states: Secondary | ICD-10-CM

## 2024-04-23 DIAGNOSIS — Z91199 Patient's noncompliance with other medical treatment and regimen due to unspecified reason: Secondary | ICD-10-CM | POA: Diagnosis not present

## 2024-04-23 DIAGNOSIS — O99323 Drug use complicating pregnancy, third trimester: Secondary | ICD-10-CM | POA: Diagnosis not present

## 2024-04-23 DIAGNOSIS — O9081 Anemia of the puerperium: Secondary | ICD-10-CM | POA: Diagnosis not present

## 2024-04-23 DIAGNOSIS — E875 Hyperkalemia: Secondary | ICD-10-CM | POA: Diagnosis not present

## 2024-04-23 DIAGNOSIS — K5229 Other allergic and dietetic gastroenteritis and colitis: Secondary | ICD-10-CM | POA: Diagnosis not present

## 2024-04-23 DIAGNOSIS — Z555 Less than a high school diploma: Secondary | ICD-10-CM

## 2024-04-23 DIAGNOSIS — J189 Pneumonia, unspecified organism: Secondary | ICD-10-CM | POA: Diagnosis not present

## 2024-04-23 DIAGNOSIS — G9340 Encephalopathy, unspecified: Secondary | ICD-10-CM | POA: Diagnosis not present

## 2024-04-23 HISTORY — DX: Other psychoactive substance abuse, uncomplicated: F19.10

## 2024-04-23 LAB — CBC
HCT: 32.7 % — ABNORMAL LOW (ref 36.0–46.0)
HCT: 29.7 % — ABNORMAL LOW (ref 36.0–46.0)
Hemoglobin: 11.2 g/dL — ABNORMAL LOW (ref 12.0–15.0)
Hemoglobin: 10.5 g/dL — ABNORMAL LOW (ref 12.0–15.0)
MCH: 29.3 pg (ref 26.0–34.0)
MCH: 29.4 pg (ref 26.0–34.0)
MCHC: 34.3 g/dL (ref 30.0–36.0)
MCHC: 35.4 g/dL (ref 30.0–36.0)
MCV: 85.6 fL (ref 80.0–100.0)
MCV: 83.2 fL (ref 80.0–100.0)
Platelets: 266 K/uL (ref 150–400)
Platelets: 250 K/uL (ref 150–400)
RBC: 3.82 MIL/uL — ABNORMAL LOW (ref 3.87–5.11)
RBC: 3.57 MIL/uL — ABNORMAL LOW (ref 3.87–5.11)
RDW: 15.7 % — ABNORMAL HIGH (ref 11.5–15.5)
RDW: 15.6 % — ABNORMAL HIGH (ref 11.5–15.5)
WBC: 4.5 K/uL (ref 4.0–10.5)
WBC: 6.5 K/uL (ref 4.0–10.5)
nRBC: 0 % (ref 0.0–0.2)
nRBC: 0 % (ref 0.0–0.2)

## 2024-04-23 LAB — GLUCOSE, CAPILLARY
Glucose-Capillary: 143 mg/dL — ABNORMAL HIGH (ref 70–99)
Glucose-Capillary: 111 mg/dL — ABNORMAL HIGH (ref 70–99)
Glucose-Capillary: 142 mg/dL — ABNORMAL HIGH (ref 70–99)
Glucose-Capillary: 126 mg/dL — ABNORMAL HIGH (ref 70–99)
Glucose-Capillary: 128 mg/dL — ABNORMAL HIGH (ref 70–99)
Glucose-Capillary: 129 mg/dL — ABNORMAL HIGH (ref 70–99)

## 2024-04-23 LAB — BLOOD GAS, ARTERIAL
Acid-base deficit: 7.7 mmol/L — ABNORMAL HIGH (ref 0.0–2.0)
Bicarbonate: 18.3 mmol/L — ABNORMAL LOW (ref 20.0–28.0)
O2 Saturation: 99.8 %
Patient temperature: 37
pCO2 arterial: 38 mmHg (ref 32–48)
pH, Arterial: 7.29 — ABNORMAL LOW (ref 7.35–7.45)
pO2, Arterial: 286 mmHg — ABNORMAL HIGH (ref 83–108)

## 2024-04-23 LAB — POCT I-STAT 7, (LYTES, BLD GAS, ICA,H+H)
Acid-base deficit: 7 mmol/L — ABNORMAL HIGH (ref 0.0–2.0)
Acid-base deficit: 7 mmol/L — ABNORMAL HIGH (ref 0.0–2.0)
Acid-base deficit: 7 mmol/L — ABNORMAL HIGH (ref 0.0–2.0)
Acid-base deficit: 7 mmol/L — ABNORMAL HIGH (ref 0.0–2.0)
Acid-base deficit: 8 mmol/L — ABNORMAL HIGH (ref 0.0–2.0)
Bicarbonate: 19.1 mmol/L — ABNORMAL LOW (ref 20.0–28.0)
Bicarbonate: 19.8 mmol/L — ABNORMAL LOW (ref 20.0–28.0)
Bicarbonate: 19.4 mmol/L — ABNORMAL LOW (ref 20.0–28.0)
Bicarbonate: 19.6 mmol/L — ABNORMAL LOW (ref 20.0–28.0)
Bicarbonate: 19.8 mmol/L — ABNORMAL LOW (ref 20.0–28.0)
Calcium, Ion: 1.21 mmol/L (ref 1.15–1.40)
Calcium, Ion: 1.21 mmol/L (ref 1.15–1.40)
Calcium, Ion: 1.21 mmol/L (ref 1.15–1.40)
Calcium, Ion: 1.26 mmol/L (ref 1.15–1.40)
Calcium, Ion: 1.23 mmol/L (ref 1.15–1.40)
HCT: 26 % — ABNORMAL LOW (ref 36.0–46.0)
HCT: 24 % — ABNORMAL LOW (ref 36.0–46.0)
HCT: 26 % — ABNORMAL LOW (ref 36.0–46.0)
HCT: 23 % — ABNORMAL LOW (ref 36.0–46.0)
HCT: 28 % — ABNORMAL LOW (ref 36.0–46.0)
Hemoglobin: 8.8 g/dL — ABNORMAL LOW (ref 12.0–15.0)
Hemoglobin: 8.2 g/dL — ABNORMAL LOW (ref 12.0–15.0)
Hemoglobin: 8.8 g/dL — ABNORMAL LOW (ref 12.0–15.0)
Hemoglobin: 7.8 g/dL — ABNORMAL LOW (ref 12.0–15.0)
Hemoglobin: 9.5 g/dL — ABNORMAL LOW (ref 12.0–15.0)
O2 Saturation: 97 %
O2 Saturation: 90 %
O2 Saturation: 94 %
O2 Saturation: 98 %
O2 Saturation: 97 %
Patient temperature: 98.4
Patient temperature: 97.7
Patient temperature: 97.7
Patient temperature: 97.9
Patient temperature: 97.9
Potassium: 3.6 mmol/L (ref 3.5–5.1)
Potassium: 3.5 mmol/L (ref 3.5–5.1)
Potassium: 4.2 mmol/L (ref 3.5–5.1)
Potassium: 4.9 mmol/L (ref 3.5–5.1)
Potassium: 5.4 mmol/L — ABNORMAL HIGH (ref 3.5–5.1)
Sodium: 136 mmol/L (ref 135–145)
Sodium: 138 mmol/L (ref 135–145)
Sodium: 138 mmol/L (ref 135–145)
Sodium: 139 mmol/L (ref 135–145)
Sodium: 139 mmol/L (ref 135–145)
TCO2: 20 mmol/L — ABNORMAL LOW (ref 22–32)
TCO2: 21 mmol/L — ABNORMAL LOW (ref 22–32)
TCO2: 21 mmol/L — ABNORMAL LOW (ref 22–32)
TCO2: 21 mmol/L — ABNORMAL LOW (ref 22–32)
TCO2: 21 mmol/L — ABNORMAL LOW (ref 22–32)
pCO2 arterial: 38.8 mmHg (ref 32–48)
pCO2 arterial: 42.6 mmHg (ref 32–48)
pCO2 arterial: 39.7 mmHg (ref 32–48)
pCO2 arterial: 42.7 mmHg (ref 32–48)
pCO2 arterial: 47.9 mmHg (ref 32–48)
pH, Arterial: 7.3 — ABNORMAL LOW (ref 7.35–7.45)
pH, Arterial: 7.272 — ABNORMAL LOW (ref 7.35–7.45)
pH, Arterial: 7.294 — ABNORMAL LOW (ref 7.35–7.45)
pH, Arterial: 7.268 — ABNORMAL LOW (ref 7.35–7.45)
pH, Arterial: 7.222 — ABNORMAL LOW (ref 7.35–7.45)
pO2, Arterial: 96 mmHg (ref 83–108)
pO2, Arterial: 66 mmHg — ABNORMAL LOW (ref 83–108)
pO2, Arterial: 76 mmHg — ABNORMAL LOW (ref 83–108)
pO2, Arterial: 122 mmHg — ABNORMAL HIGH (ref 83–108)
pO2, Arterial: 108 mmHg (ref 83–108)

## 2024-04-23 LAB — RAPID URINE DRUG SCREEN, HOSP PERFORMED
Amphetamines: NOT DETECTED
Barbiturates: NOT DETECTED
Benzodiazepines: POSITIVE — AB
Cocaine: POSITIVE — AB
Opiates: NOT DETECTED
Tetrahydrocannabinol: POSITIVE — AB

## 2024-04-23 LAB — CBC WITH DIFFERENTIAL/PLATELET
Abs Immature Granulocytes: 0.05 K/uL (ref 0.00–0.07)
Basophils Absolute: 0 K/uL (ref 0.0–0.1)
Basophils Relative: 0 %
Eosinophils Absolute: 0 K/uL (ref 0.0–0.5)
Eosinophils Relative: 0 %
HCT: 28.3 % — ABNORMAL LOW (ref 36.0–46.0)
Hemoglobin: 9.8 g/dL — ABNORMAL LOW (ref 12.0–15.0)
Immature Granulocytes: 1 %
Lymphocytes Relative: 6 %
Lymphs Abs: 0.5 K/uL — ABNORMAL LOW (ref 0.7–4.0)
MCH: 29.3 pg (ref 26.0–34.0)
MCHC: 34.6 g/dL (ref 30.0–36.0)
MCV: 84.7 fL (ref 80.0–100.0)
Monocytes Absolute: 0.3 K/uL (ref 0.1–1.0)
Monocytes Relative: 4 %
Neutro Abs: 7.6 K/uL (ref 1.7–7.7)
Neutrophils Relative %: 89 %
Platelets: 205 K/uL (ref 150–400)
RBC: 3.34 MIL/uL — ABNORMAL LOW (ref 3.87–5.11)
RDW: 15.7 % — ABNORMAL HIGH (ref 11.5–15.5)
WBC: 8.4 K/uL (ref 4.0–10.5)
nRBC: 0 % (ref 0.0–0.2)

## 2024-04-23 LAB — BASIC METABOLIC PANEL WITH GFR
Anion gap: 8 (ref 5–15)
BUN: 9 mg/dL (ref 6–20)
CO2: 19 mmol/L — ABNORMAL LOW (ref 22–32)
Calcium: 7.8 mg/dL — ABNORMAL LOW (ref 8.9–10.3)
Chloride: 112 mmol/L — ABNORMAL HIGH (ref 98–111)
Creatinine, Ser: 0.97 mg/dL (ref 0.44–1.00)
GFR, Estimated: >60 mL/min (ref >60)
Glucose, Bld: 129 mg/dL — ABNORMAL HIGH (ref 70–99)
Potassium: 4.8 mmol/L (ref 3.5–5.1)
Sodium: 139 mmol/L (ref 135–145)

## 2024-04-23 LAB — TSH: TSH: 0.397 u[IU]/mL (ref 0.350–4.500)

## 2024-04-23 LAB — COMPREHENSIVE METABOLIC PANEL WITH GFR
ALT: 16 U/L (ref 0–44)
ALT: 15 U/L (ref 0–44)
AST: 33 U/L (ref 15–41)
AST: 40 U/L (ref 15–41)
Albumin: 2.1 g/dL — ABNORMAL LOW (ref 3.5–5.0)
Albumin: 1.9 g/dL — ABNORMAL LOW (ref 3.5–5.0)
Alkaline Phosphatase: 131 U/L — ABNORMAL HIGH (ref 38–126)
Alkaline Phosphatase: 101 U/L (ref 38–126)
Anion gap: 9 (ref 5–15)
Anion gap: 10 (ref 5–15)
BUN: 5 mg/dL — ABNORMAL LOW (ref 6–20)
BUN: 9 mg/dL (ref 6–20)
CO2: 19 mmol/L — ABNORMAL LOW (ref 22–32)
CO2: 17 mmol/L — ABNORMAL LOW (ref 22–32)
Calcium: 7.2 mg/dL — ABNORMAL LOW (ref 8.9–10.3)
Calcium: 7.6 mg/dL — ABNORMAL LOW (ref 8.9–10.3)
Chloride: 107 mmol/L (ref 98–111)
Chloride: 110 mmol/L (ref 98–111)
Creatinine, Ser: 0.94 mg/dL (ref 0.44–1.00)
Creatinine, Ser: 1 mg/dL (ref 0.44–1.00)
GFR, Estimated: >60 mL/min (ref >60)
GFR, Estimated: >60 mL/min (ref >60)
Glucose, Bld: 254 mg/dL — ABNORMAL HIGH (ref 70–99)
Glucose, Bld: 119 mg/dL — ABNORMAL HIGH (ref 70–99)
Potassium: 3.1 mmol/L — ABNORMAL LOW (ref 3.5–5.1)
Potassium: 3.4 mmol/L — ABNORMAL LOW (ref 3.5–5.1)
Sodium: 135 mmol/L (ref 135–145)
Sodium: 137 mmol/L (ref 135–145)
Total Bilirubin: 0.6 mg/dL (ref 0.0–1.2)
Total Bilirubin: 0.7 mg/dL (ref 0.0–1.2)
Total Protein: 5 g/dL — ABNORMAL LOW (ref 6.5–8.1)
Total Protein: 4.8 g/dL — ABNORMAL LOW (ref 6.5–8.1)

## 2024-04-23 LAB — PREPARE RBC (CROSSMATCH)

## 2024-04-23 LAB — I-STAT ARTERIAL BLOOD GAS, ED
Acid-base deficit: 8 mmol/L — ABNORMAL HIGH (ref 0.0–2.0)
Bicarbonate: 19.6 mmol/L — ABNORMAL LOW (ref 20.0–28.0)
Calcium, Ion: 1.13 mmol/L — ABNORMAL LOW (ref 1.15–1.40)
HCT: 30 % — ABNORMAL LOW (ref 36.0–46.0)
Hemoglobin: 10.2 g/dL — ABNORMAL LOW (ref 12.0–15.0)
O2 Saturation: 95 %
Patient temperature: 98.3
Potassium: 3.2 mmol/L — ABNORMAL LOW (ref 3.5–5.1)
Sodium: 137 mmol/L (ref 135–145)
TCO2: 21 mmol/L — ABNORMAL LOW (ref 22–32)
pCO2 arterial: 45.9 mmHg (ref 32–48)
pH, Arterial: 7.238 — ABNORMAL LOW (ref 7.35–7.45)
pO2, Arterial: 90 mmHg (ref 83–108)

## 2024-04-23 LAB — LACTIC ACID, PLASMA
Lactic Acid, Venous: 1.7 mmol/L (ref 0.5–1.9)
Lactic Acid, Venous: 1.8 mmol/L (ref 0.5–1.9)

## 2024-04-23 LAB — DIC (DISSEMINATED INTRAVASCULAR COAGULATION)PANEL
D-Dimer, Quant: 10.83 ug{FEU}/mL — ABNORMAL HIGH (ref 0.00–0.50)
Fibrinogen: 458 mg/dL (ref 210–475)
INR: 1.1 (ref 0.8–1.2)
Platelets: 208 K/uL (ref 150–400)
Prothrombin Time: 15.1 s (ref 11.4–15.2)
Smear Review: NONE SEEN
aPTT: 26 s (ref 24–36)

## 2024-04-23 LAB — HEMOGLOBIN AND HEMATOCRIT, BLOOD
HCT: 27.8 % — ABNORMAL LOW (ref 36.0–46.0)
HCT: 33.3 % — ABNORMAL LOW (ref 36.0–46.0)
Hemoglobin: 9.7 g/dL — ABNORMAL LOW (ref 12.0–15.0)
Hemoglobin: 11.1 g/dL — ABNORMAL LOW (ref 12.0–15.0)

## 2024-04-23 LAB — CREATININE, SERUM
Creatinine, Ser: 1.12 mg/dL — ABNORMAL HIGH (ref 0.44–1.00)
GFR, Estimated: >60 mL/min (ref >60)

## 2024-04-23 LAB — PHOSPHORUS
Phosphorus: 3.1 mg/dL (ref 2.5–4.6)
Phosphorus: 3.9 mg/dL (ref 2.5–4.6)

## 2024-04-23 LAB — MRSA NEXT GEN BY PCR, NASAL: MRSA by PCR Next Gen: NOT DETECTED

## 2024-04-23 LAB — MAGNESIUM
Magnesium: 2.8 mg/dL — ABNORMAL HIGH (ref 1.7–2.4)
Magnesium: 2.8 mg/dL — ABNORMAL HIGH (ref 1.7–2.4)

## 2024-04-23 LAB — PROTEIN / CREATININE RATIO, URINE
Creatinine, Urine: 60 mg/dL
Protein Creatinine Ratio: 0.8 mg/mg{creat} — ABNORMAL HIGH (ref 0.00–0.15)
Total Protein, Urine: 48 mg/dL

## 2024-04-23 LAB — PROTIME-INR
INR: 1 (ref 0.8–1.2)
Prothrombin Time: 14.1 s (ref 11.4–15.2)

## 2024-04-23 LAB — URIC ACID: Uric Acid, Serum: 6 mg/dL (ref 2.5–7.1)

## 2024-04-23 LAB — KLEIHAUER-BETKE STAIN
Fetal Cells %: 0 %
Quantitation Fetal Hemoglobin: 0 mL

## 2024-04-23 LAB — HEMOGLOBIN A1C
Hgb A1c MFr Bld: 3.8 % — ABNORMAL LOW (ref 4.8–5.6)
Mean Plasma Glucose: 62.36 mg/dL

## 2024-04-23 LAB — HIV ANTIBODY (ROUTINE TESTING W REFLEX): HIV Screen 4th Generation wRfx: NONREACTIVE

## 2024-04-23 MED ORDER — FAMOTIDINE 20 MG PO TABS
20.0000 mg | ORAL_TABLET | Freq: Two times a day (BID) | ORAL | Status: DC
Start: 2024-04-23 — End: 2024-04-23

## 2024-04-23 MED ORDER — LACTATED RINGERS IV SOLN: INTRAVENOUS | Status: DC

## 2024-04-23 MED ORDER — NOREPINEPHRINE 4 MG/250ML-% IV SOLN
0.0000 ug/min | INTRAVENOUS | Status: DC
  Administered 2024-04-23: 11 ug/min via INTRAVENOUS
  Administered 2024-04-23: 4 ug/min via INTRAVENOUS
  Filled 2024-04-23: qty 250

## 2024-04-23 MED ORDER — NOREPINEPHRINE 4 MG/250ML-% IV SOLN
0.0000 ug/min | INTRAVENOUS | Status: DC
  Administered 2024-04-23: 11 ug/min via INTRAVENOUS
  Filled 2024-04-23: qty 250

## 2024-04-23 MED ORDER — LEVETIRACETAM (KEPPRA) 500 MG/5 ML ADULT IV PUSH
1500.0000 mg | Freq: Two times a day (BID) | INTRAVENOUS | Status: DC
  Administered 2024-04-23 – 2024-05-02 (×18): 1500 mg via INTRAVENOUS
  Filled 2024-04-23 (×18): qty 15

## 2024-04-23 MED ORDER — OXYTOCIN-SODIUM CHLORIDE 30-0.9 UT/500ML-% IV SOLN
INTRAVENOUS | Status: AC
  Filled 2024-04-23: qty 500

## 2024-04-23 MED ORDER — HEPARIN SODIUM (PORCINE) 5000 UNIT/ML IJ SOLN
5000.0000 [IU] | Freq: Three times a day (TID) | INTRAMUSCULAR | Status: DC
  Administered 2024-04-23 – 2024-05-07 (×44): 5000 [IU] via SUBCUTANEOUS
  Filled 2024-04-23 (×45): qty 1

## 2024-04-23 MED ORDER — POTASSIUM CHLORIDE 10 MEQ/100ML IV SOLN
10.0000 meq | INTRAVENOUS | Status: AC
  Administered 2024-04-23 (×2): 10 meq via INTRAVENOUS
  Filled 2024-04-23: qty 100

## 2024-04-23 MED ORDER — INSULIN ASPART 100 UNIT/ML IJ SOLN
0.0000 [IU] | INTRAMUSCULAR | Status: DC
  Administered 2024-04-23 – 2024-04-25 (×8): 2 [IU] via SUBCUTANEOUS

## 2024-04-23 MED ORDER — PROPOFOL 500 MG/50ML IV EMUL
INTRAVENOUS | Status: AC
  Filled 2024-04-23: qty 50

## 2024-04-23 MED ORDER — POVIDONE-IODINE 10 % EX SWAB: 2.0000 | Freq: Once | CUTANEOUS | Status: DC

## 2024-04-23 MED ORDER — PROPOFOL 10 MG/ML IV BOLUS
INTRAVENOUS | Status: AC
Start: 2024-04-23 — End: 2024-04-23
  Filled 2024-04-23: qty 20

## 2024-04-23 MED ORDER — TRANEXAMIC ACID-NACL 1000-0.7 MG/100ML-% IV SOLN
INTRAVENOUS | Status: DC | PRN
Start: 2024-04-23 — End: 2024-04-23
  Administered 2024-04-23: 1000 mg via INTRAVENOUS

## 2024-04-23 MED ORDER — COCONUT OIL OIL
1.0000 | TOPICAL_OIL | Status: DC | PRN
  Administered 2024-04-28: 1 via TOPICAL
  Filled 2024-04-23: qty 7.5

## 2024-04-23 MED ORDER — ONDANSETRON HCL 4 MG/2ML IJ SOLN
4.0000 mg | Freq: Four times a day (QID) | INTRAMUSCULAR | Status: DC | PRN
  Administered 2024-04-25 – 2024-05-06 (×3): 4 mg via INTRAVENOUS
  Filled 2024-04-23 (×2): qty 2

## 2024-04-23 MED ORDER — ROCURONIUM BROMIDE 10 MG/ML (PF) SYRINGE
PREFILLED_SYRINGE | INTRAVENOUS | Status: AC
Start: 2024-04-23 — End: 2024-04-23
  Filled 2024-04-23: qty 10

## 2024-04-23 MED ORDER — CEFAZOLIN SODIUM-DEXTROSE 2-4 GM/100ML-% IV SOLN: 2.0000 g | INTRAVENOUS | Status: DC

## 2024-04-23 MED ORDER — NOREPINEPHRINE 4 MG/250ML-% IV SOLN
INTRAVENOUS | Status: AC
  Filled 2024-04-23: qty 250

## 2024-04-23 MED ORDER — PHENYLEPHRINE HCL (PRESSORS) 10 MG/ML IV SOLN
INTRAVENOUS | Status: DC | PRN
  Administered 2024-04-23 (×2): 80 ug via INTRAVENOUS

## 2024-04-23 MED ORDER — ORAL CARE MOUTH RINSE
15.0000 mL | OROMUCOSAL | Status: DC | PRN
  Administered 2024-04-23 – 2024-04-28 (×2): 15 mL via OROMUCOSAL

## 2024-04-23 MED ORDER — AMPICILLIN-SULBACTAM SODIUM 3 (2-1) G IJ SOLR
3.0000 g | Freq: Four times a day (QID) | INTRAMUSCULAR | Status: AC
  Administered 2024-04-23 – 2024-04-28 (×20): 3 g via INTRAVENOUS
  Filled 2024-04-23 (×20): qty 8

## 2024-04-23 MED ORDER — ROCURONIUM BROMIDE 100 MG/10ML IV SOLN
INTRAVENOUS | Status: DC | PRN
  Administered 2024-04-23: 40 mg via INTRAVENOUS

## 2024-04-23 MED ORDER — FAMOTIDINE 20 MG PO TABS
20.0000 mg | ORAL_TABLET | Freq: Two times a day (BID) | ORAL | Status: DC
  Administered 2024-04-23 – 2024-05-05 (×25): 20 mg
  Filled 2024-04-23 (×25): qty 1

## 2024-04-23 MED ORDER — CHLORHEXIDINE GLUCONATE CLOTH 2 % EX PADS
6.0000 | MEDICATED_PAD | Freq: Every day | CUTANEOUS | Status: DC
  Administered 2024-04-24: 6 via TOPICAL

## 2024-04-23 MED ORDER — FENTANYL BOLUS VIA INFUSION
25.0000 ug | INTRAVENOUS | Status: DC | PRN
  Administered 2024-04-23: 100 ug via INTRAVENOUS
  Administered 2024-04-23: 50 ug via INTRAVENOUS
  Administered 2024-04-23: 75 ug via INTRAVENOUS
  Administered 2024-04-23: 50 ug via INTRAVENOUS
  Administered 2024-04-23: 100 ug via INTRAVENOUS
  Administered 2024-04-23: 50 ug via INTRAVENOUS
  Administered 2024-04-23 (×4): 100 ug via INTRAVENOUS
  Administered 2024-04-23: 75 ug via INTRAVENOUS
  Administered 2024-04-23 – 2024-04-24 (×7): 100 ug via INTRAVENOUS
  Administered 2024-04-25: 25 ug via INTRAVENOUS
  Administered 2024-04-25 (×2): 100 ug via INTRAVENOUS
  Administered 2024-04-25: 50 ug via INTRAVENOUS
  Administered 2024-04-25 – 2024-04-26 (×2): 100 ug via INTRAVENOUS
  Administered 2024-04-26 (×2): 75 ug via INTRAVENOUS
  Administered 2024-04-26: 25 ug via INTRAVENOUS
  Administered 2024-04-26: 100 ug via INTRAVENOUS
  Administered 2024-04-27: 25 ug via INTRAVENOUS
  Administered 2024-04-27 (×2): 50 ug via INTRAVENOUS
  Administered 2024-04-27: 25 ug via INTRAVENOUS
  Administered 2024-04-27 – 2024-04-28 (×3): 50 ug via INTRAVENOUS
  Administered 2024-04-28 – 2024-04-29 (×15): 100 ug via INTRAVENOUS
  Administered 2024-04-29: 50 ug via INTRAVENOUS
  Administered 2024-04-29 (×3): 100 ug via INTRAVENOUS
  Administered 2024-04-29: 50 ug via INTRAVENOUS
  Administered 2024-04-29 – 2024-04-30 (×15): 100 ug via INTRAVENOUS

## 2024-04-23 MED ORDER — SODIUM CHLORIDE 0.9% IV SOLUTION: Freq: Once | INTRAVENOUS | Status: AC

## 2024-04-23 MED ORDER — LACTATED RINGERS IV BOLUS
1000.0000 mL | Freq: Once | INTRAVENOUS | Status: AC
  Administered 2024-04-23: 1000 mL via INTRAVENOUS

## 2024-04-23 MED ORDER — CLINDAMYCIN PHOSPHATE 900 MG/50ML IV SOLN
900.0000 mg | INTRAVENOUS | Status: AC
Start: 2024-04-23 — End: 2024-04-23
  Administered 2024-04-23: 900 mg via INTRAVENOUS

## 2024-04-23 MED ORDER — ACETAMINOPHEN 500 MG PO TABS: 1000.0000 mg | ORAL_TABLET | ORAL | Status: DC

## 2024-04-23 MED ORDER — ONDANSETRON HCL 4 MG/2ML IJ SOLN
INTRAMUSCULAR | Status: AC
  Filled 2024-04-23: qty 2

## 2024-04-23 MED ORDER — TRANEXAMIC ACID-NACL 1000-0.7 MG/100ML-% IV SOLN
INTRAVENOUS | Status: AC
  Filled 2024-04-23: qty 100

## 2024-04-23 MED ORDER — SODIUM CHLORIDE 0.9 % IV SOLN: 250.0000 mL | INTRAVENOUS | Status: AC

## 2024-04-23 MED ORDER — SOD CITRATE-CITRIC ACID 500-334 MG/5ML PO SOLN: 30.0000 mL | ORAL | Status: DC

## 2024-04-23 MED ORDER — SODIUM CHLORIDE 0.9 % IV SOLN: INTRAVENOUS | Status: AC | PRN

## 2024-04-23 MED ORDER — LAMOTRIGINE 100 MG PO TABS
200.0000 mg | ORAL_TABLET | Freq: Two times a day (BID) | ORAL | Status: DC
  Administered 2024-04-23 – 2024-05-04 (×24): 200 mg
  Filled 2024-04-23 (×25): qty 2

## 2024-04-23 MED ORDER — PROPOFOL 1000 MG/100ML IV EMUL
0.0000 ug/kg/min | INTRAVENOUS | Status: DC
  Administered 2024-04-23: 10 ug/kg/min via INTRAVENOUS
  Administered 2024-04-23 – 2024-04-24 (×2): 50 ug/kg/min via INTRAVENOUS
  Administered 2024-04-24 (×2): 20 ug/kg/min via INTRAVENOUS
  Administered 2024-04-25: 60 ug/kg/min via INTRAVENOUS
  Administered 2024-04-25: 55 ug/kg/min via INTRAVENOUS
  Administered 2024-04-25: 20 ug/kg/min via INTRAVENOUS
  Administered 2024-04-26: 40 ug/kg/min via INTRAVENOUS
  Administered 2024-04-26: 30 ug/kg/min via INTRAVENOUS
  Administered 2024-04-27 (×2): 20 ug/kg/min via INTRAVENOUS
  Administered 2024-04-28: 25 ug/kg/min via INTRAVENOUS
  Administered 2024-04-28: 30 ug/kg/min via INTRAVENOUS
  Administered 2024-04-28: 21 ug/kg/min via INTRAVENOUS
  Administered 2024-04-29: 35 ug/kg/min via INTRAVENOUS
  Filled 2024-04-23 (×6): qty 100
  Filled 2024-04-23: qty 200
  Filled 2024-04-23 (×11): qty 100
  Filled 2024-04-23: qty 200

## 2024-04-23 MED ORDER — OXYTOCIN-SODIUM CHLORIDE 30-0.9 UT/500ML-% IV SOLN
2.5000 [IU]/h | INTRAVENOUS | Status: AC
  Filled 2024-04-23: qty 500

## 2024-04-23 MED ORDER — BETAMETHASONE SOD PHOS & ACET 6 (3-3) MG/ML IJ SUSP
12.0000 mg | Freq: Once | INTRAMUSCULAR | Status: AC
  Administered 2024-04-23: 12 mg via INTRAMUSCULAR
  Filled 2024-04-23: qty 2

## 2024-04-23 MED ORDER — ONDANSETRON HCL 4 MG/2ML IJ SOLN
INTRAMUSCULAR | Status: DC | PRN
  Administered 2024-04-23: 4 mg via INTRAVENOUS

## 2024-04-23 MED ORDER — DEXAMETHASONE SOD PHOSPHATE PF 10 MG/ML IJ SOLN
20.0000 mg | INTRAMUSCULAR | Status: DC
  Administered 2024-04-23 – 2024-04-25 (×3): 20 mg via INTRAVENOUS

## 2024-04-23 MED ORDER — LEVETIRACETAM (KEPPRA) 500 MG/5 ML ADULT IV PUSH
750.0000 mg | Freq: Four times a day (QID) | INTRAVENOUS | Status: DC
  Administered 2024-04-23 (×2): 750 mg via INTRAVENOUS
  Filled 2024-04-23 (×2): qty 10

## 2024-04-23 MED ORDER — PROPOFOL 500 MG/50ML IV EMUL
INTRAVENOUS | Status: DC | PRN
  Administered 2024-04-23: 30 mg via INTRAVENOUS

## 2024-04-23 MED ORDER — FENTANYL 2500MCG IN NS 250ML (10MCG/ML) PREMIX INFUSION: 0.0000 ug/h | INTRAVENOUS | Status: DC

## 2024-04-23 MED ORDER — ORAL CARE MOUTH RINSE
15.0000 mL | OROMUCOSAL | Status: DC
  Administered 2024-04-23 – 2024-05-05 (×137): 15 mL via OROMUCOSAL

## 2024-04-23 MED ORDER — LACTATED RINGERS IV BOLUS: 1000.0000 mL | Freq: Once | INTRAVENOUS | Status: DC

## 2024-04-23 MED ORDER — DEXAMETHASONE SOD PHOSPHATE PF 10 MG/ML IJ SOLN: 10.0000 mg | INTRAMUSCULAR | Status: DC

## 2024-04-23 MED ORDER — FENTANYL CITRATE (PF) 250 MCG/5ML IJ SOLN
INTRAMUSCULAR | Status: AC
  Filled 2024-04-23: qty 5

## 2024-04-23 MED ORDER — TRANEXAMIC ACID-NACL 1000-0.7 MG/100ML-% IV SOLN: 1000.0000 mg | Freq: Once | INTRAVENOUS | Status: DC

## 2024-04-23 MED ORDER — GENTAMICIN SULFATE 40 MG/ML IJ SOLN
5.0000 mg/kg | INTRAVENOUS | Status: AC
  Administered 2024-04-23: 285.2 mg via INTRAVENOUS
  Filled 2024-04-23: qty 7.25

## 2024-04-23 MED ORDER — FENTANYL 2500MCG IN NS 250ML (10MCG/ML) PREMIX INFUSION
0.0000 ug/h | INTRAVENOUS | Status: DC
  Administered 2024-04-23: 250 ug/h via INTRAVENOUS
  Administered 2024-04-23: 300 ug/h via INTRAVENOUS
  Administered 2024-04-23: 50 ug/h via INTRAVENOUS
  Administered 2024-04-24: 350 ug/h via INTRAVENOUS
  Administered 2024-04-24: 125 ug/h via INTRAVENOUS
  Administered 2024-04-25: 200 ug/h via INTRAVENOUS
  Administered 2024-04-26: 50 ug/h via INTRAVENOUS
  Administered 2024-04-27: 75 ug/h via INTRAVENOUS
  Administered 2024-04-28 (×2): 100 ug/h via INTRAVENOUS
  Administered 2024-04-29: 250 ug/h via INTRAVENOUS
  Administered 2024-04-29: 125 ug/h via INTRAVENOUS
  Administered 2024-04-30 (×3): 400 ug/h via INTRAVENOUS
  Filled 2024-04-23 (×15): qty 250

## 2024-04-23 MED ORDER — POTASSIUM CHLORIDE 10 MEQ/100ML IV SOLN
10.0000 meq | INTRAVENOUS | Status: AC
  Administered 2024-04-23 (×2): 10 meq via INTRAVENOUS

## 2024-04-23 MED ORDER — OXYTOCIN-SODIUM CHLORIDE 30-0.9 UT/500ML-% IV SOLN
41.7000 mL/h | INTRAVENOUS | Status: DC
  Administered 2024-04-23: 41.7 mL/h via INTRAVENOUS
  Filled 2024-04-23: qty 500

## 2024-04-23 MED ORDER — OXYTOCIN-SODIUM CHLORIDE 30-0.9 UT/500ML-% IV SOLN
INTRAVENOUS | Status: DC | PRN
  Administered 2024-04-23: 300 mL via INTRAVENOUS

## 2024-04-23 MED ORDER — MIDAZOLAM-SODIUM CHLORIDE 100-0.9 MG/100ML-% IV SOLN
0.5000 mg/h | INTRAVENOUS | Status: DC
  Administered 2024-04-23: 10 mg/h via INTRAVENOUS
  Administered 2024-04-23: 12 mg/h via INTRAVENOUS
  Filled 2024-04-23 (×2): qty 100

## 2024-04-23 MED ORDER — FENTANYL CITRATE (PF) 50 MCG/ML IJ SOSY
25.0000 ug | PREFILLED_SYRINGE | Freq: Once | INTRAMUSCULAR | Status: AC
  Administered 2024-04-23: 50 ug via INTRAVENOUS

## 2024-04-23 MED ORDER — MAGNESIUM SULFATE 2 GM/50ML IV SOLN
2.0000 g | Freq: Once | INTRAVENOUS | Status: AC
  Administered 2024-04-23: 2 g via INTRAVENOUS
  Filled 2024-04-23: qty 50

## 2024-04-23 NOTE — Progress Notes (Signed)
 EEG leads repaired per [facility]'s request. Patient's hair is matted which is pushing the electrodes up - head wrapped to help hold the leads down. No skin breakdown seen.

## 2024-04-23 NOTE — ED Triage Notes (Signed)
 Pt arrives by [facility] from [facility].  Pt was seen there for seizure and intubated after suspected aspiration during seizure.  Pt is on versed  drip.  Pt is pregnant and due 12/28.  OB RN placed baby on monitor FHR 158 on arrival.  Critical care in trauma room on arrival.

## 2024-04-23 NOTE — Progress Notes (Addendum)
  Seen again in follow up  More hypotensive MAPs low 60s on 10 NE  Some clots from uterus, likely normal post partum  Hgb on last ABG down to 7.8   P 1 PRBC Incr NE ceiling to 15 Will plan for CVC placement after OB has next seen pt  ______________  Seen pt in follow up EBL in op note 800, anesthesia record [id number].  Sending post op cbc dic CXR post op not read but w incr bilat opacities ABG pending  Rcvd call back from pt mom (I had left message pre-op) and informed her of pts urgent cs. She largely has questions re the baby which I am unable to answer. All questions re Dezaree were answered, and plan of care discussed.    On low dose NE. Will defer cvc for now and see. May still need  D/w neuro -- plans to wean versed    Addl time since 0839 25 min  ____________________  OB rapid to bedside after instance of fetal HR <80 which did improve >110  it sounds like in their review of tracings, c/f recurrent lates.  OB to bedside.   Worse maternal hypotension, had been started recently on NE   Plan  -emergent C section -1L LR bolus now, cont NE  -I have tried x 2 to reach pts mother re change in plan, no answer     Add'l cct 14 min    Ronnald Gave MSN, AGACNP-BC [facility] Pulmonary/Critical Care Medicine 04/23/2024, 8:39 AM

## 2024-04-23 NOTE — H&P (Signed)
 NAME:  Colleen Pearson, MRN:  [id number], DOB:  1991-06-01, LOS: 0 ADMISSION DATE:  04/23/2024, CONSULTATION DATE:  04/22/2024 REFERRING MD:  Oneil Budge, MD, CHIEF COMPLAINT:  Status Epilepticus   History of Present Illness:  33 y/o female with h/o Epilepsy and Polysubstance d/o who is a [id number] and is currently [redacted] weeks pregnant who was on the cough and then have several seizures.  EMS gave 4mg  IV Magnesium  and 5mg  IV Midazolam .  She was lethargic with secretions not able to protect her airways and she was intubated in the ED.  She has been started on Versed  drip.  She was loaded with Keppra .  There has been reports of non-compliance with her seizure meds-Keppra  and Lamictal .  She also had low levels as outpatient.  She has seizures in September as well, similar situation and apparently has a seizure two weeks prior to that ED visit.  Pertinent  Medical History  Epilepsy and Polysubstance d/o, CF carrier  Significant Hospital Events: Including procedures, antibiotic start and stop dates in addition to other pertinent events   11/1: transfer to [facility] from [facility]  Interim History / Subjective:  N/a  Objective    Blood pressure 128/85, pulse (!) 134, temperature 98.4 F (36.9 C), resp. rate (!) 33, SpO2 100%.    Vent Mode: PRVC FiO2 (%):  [100 %] 100 % Set Rate:  [20 bmp-24 bmp] 24 bmp Vt Set:  [400 mL-450 mL] 450 mL PEEP:  [8 cmH20] 8 cmH20  No intake or output data in the 24 hours ending 04/23/24 0248 There were no vitals filed for this visit.  Examination: General: somewhat agitated when she arrived on 5mg /hr Versed  HENT: PERRLA, no icterus Lungs: course b/l rhonchi no wheezes Cardiovascular: reg, tachy, no rubs Abdomen: gravid, fetal monitor Extremities: no cyanosis, clubbing or edema Neuro: sedated and intubated but follows commands   Resolved problem list   Assessment and Plan  Status Epilepticus Neurology on board Loaded with Keppra  and to maintain Compliance is main  issue Continuous EEG monitoring Acute hypoxic respiratory failure On mech vent/vent management SAT/SBT when medically appropriate VAP prevention Possible aspiration pneumonia vs pneumonitis Broad spectrum antibiotics Sputum for cx Third trimester pregnancy Will consult OB Has been evaluated by OB at [facility] and they feel fetus is stable/viable No indication for delivery now Polysubstance abuse Check UDS   Labs   CBC: Recent Labs  Lab 04/22/24 2033 04/23/24 0103 04/23/24 0143  WBC 20.9*  --  4.5  HGB 11.1* 10.2* 11.2*  HCT 34.5* 30.0* 32.7*  MCV 90.3  --  85.6  PLT 482*  --  266    Basic Metabolic Panel: Recent Labs  Lab 04/22/24 2033 04/23/24 0103 04/23/24 0143  NA 135 137 135  K 3.2* 3.2* 3.1*  CL 103  --  107  CO2 15*  --  19*  GLUCOSE 273*  --  254*  BUN 6  --  5*  CREATININE 0.96  --  0.94  CALCIUM  8.3*  --  7.2*   GFR: Estimated Creatinine Clearance: 74.2 mL/min (by C-G formula based on SCr of 0.94 mg/dL). Recent Labs  Lab 04/22/24 2033 04/23/24 0143  WBC 20.9* 4.5    Liver Function Tests: Recent Labs  Lab 04/22/24 2033 04/23/24 0143  AST 41 33  ALT 18 16  ALKPHOS 167* 131*  BILITOT 0.5 0.6  PROT 6.7 5.0*  ALBUMIN 2.9* 2.1*   No results for input(s): LIPASE, AMYLASE in the last 168 hours. No results  for input(s): AMMONIA  in the last 168 hours.  ABG    Component Value Date/Time   PHART 7.238 (L) 04/23/2024 0103   PCO2ART 45.9 04/23/2024 0103   PO2ART 90 04/23/2024 0103   HCO3 19.6 (L) 04/23/2024 0103   TCO2 21 (L) 04/23/2024 0103   ACIDBASEDEF 8.0 (H) 04/23/2024 0103   O2SAT 95 04/23/2024 0103     Coagulation Profile: No results for input(s): INR, PROTIME in the last 168 hours.  Cardiac Enzymes: No results for input(s): CKTOTAL, CKMB, CKMBINDEX, TROPONINI in the last 168 hours.  HbA1C: Hgb A1c MFr Bld  Date/Time Value Ref Range Status  01/11/2024 10:46 AM 4.8 4.8 - 5.6 % Final    Comment:              Prediabetes: 5.7 - 6.4          Diabetes: >6.4          Glycemic control for adults with diabetes: <7.0   02/22/2023 02:45 AM 5.0 4.8 - 5.6 % Final    Comment:    (NOTE) Pre diabetes:          5.7%-6.4%  Diabetes:              >6.4%  Glycemic control for   <7.0% adults with diabetes     CBG: Recent Labs  Lab 04/22/24 2105  GLUCAP 254*    Review of Systems:   intubated  Past Medical History:  She,  has a past medical history of Anemia, Asthma, Cocaine use complicating pregnancy in third trimester (08/30/2017), Endometriosis, Nexplanon  in place (11/04/2020), Ovarian cyst, Seizures (HCC), and Status post cesarean section (08/30/2017).   Surgical History:   Past Surgical History:  Procedure Laterality Date   CESAREAN SECTION N/A 08/30/2017   Procedure: CESAREAN SECTION;  Surgeon: Leonce Garnette BIRCH, MD;  Location: [facility] ORS;  Service: Obstetrics;  Laterality: N/A;   LASER ABLATION/CAUTERIZATION OF ENDOMETRIAL IMPLANTS       Social History:   reports that she has quit smoking. Her smoking use included cigarettes and e-cigarettes. She has never used smokeless tobacco. She reports that she does not currently use drugs after having used the following drugs: Marijuana and Cocaine. She reports that she does not drink alcohol.   Family History:  Her family history includes Down syndrome in her sister.   Allergies Allergies  Allergen Reactions   Lamotrigine  Hives and Swelling    Pt is currently taking this medication   Penicillins Hives and Swelling    Tolerated Zosyn  02/24/23   Ultram [Tramadol] Hives and Swelling     Home Medications  Prior to Admission medications   Medication Sig Start Date End Date Taking? Authorizing Provider  ferrous sulfate  325 (65 FE) MG EC tablet Take 325 mg by mouth 2 (two) times daily before a meal.    [provider]  folic acid  (FOLVITE ) 1 MG tablet Take 1 tablet (1 mg total) by mouth daily. Patient not taking: Reported on 04/11/2024  01/11/24   Jayne Harlene CROME, CNM  lamoTRIgine  (LAMICTAL ) 200 MG tablet Take 1 tablet (200 mg total) by mouth 2 (two) times daily. 03/28/24   Cresenzo, Norleen GAILS, MD  levETIRAcetam  (KEPPRA ) 1000 MG tablet Take 1 tablet (1,000 mg total) by mouth 2 (two) times daily. 03/09/23 04/11/24  Love, Sharlet RAMAN, PA-C  ondansetron  (ZOFRAN -ODT) 4 MG disintegrating tablet Take 1 tablet (4 mg total) by mouth every 8 (eight) hours as needed. 01/11/24   Jayne Harlene CROME, CNM  Prenatal Vit-Fe  Fumarate-FA (PRENATAL MULTIVITAMIN) TABS tablet Take 1 tablet by mouth daily at 12 noon. 01/01/24   Viviann Pastor, MD     Critical care time: 25   The patient is critically ill with multiple organ system failure and requires high complexity decision making for assessment and support, frequent evaluation and titration of therapies, advanced monitoring, review of radiographic studies and interpretation of complex data.   Critical Care Time devoted to patient care services, exclusive of separately billable procedures, described in this note is 33 minutes.   Orlin Fairly, MD [facility] See Amion for pager  If no response to pager , please call [phone] until 7pm After 7:00 pm call Elink  [phone] 04/23/2024, 2:48 AM

## 2024-04-23 NOTE — ED Notes (Signed)
 OB was at bedside.  OB Rn remains at the bedside with pt

## 2024-04-23 NOTE — Progress Notes (Signed)
 OB Note Patient started having recurrent lates, which is a change for her, with continued minimal variability, 135 baseline, toco q3-37m. Given change in status, I recommend an urgent c/s. Patient is still intubated and sedated and no family at bedside.   D/w OB OR and anesthesia. A-line currently being placed by ICU  Bebe Izell Raddle MD Attending Center for [facility] (Faculty Practice) 04/23/2024 Time: [phone]

## 2024-04-23 NOTE — ED Notes (Signed)
 FHR 153. Pt continues to be on continuous fetal monitoring.

## 2024-04-23 NOTE — Anesthesia Postprocedure Evaluation (Signed)
 Anesthesia Post Note  Patient: Colleen Pearson  Procedure(s) Performed: CESAREAN DELIVERY     Patient location during evaluation: SICU Anesthesia Type: General Level of consciousness: sedated Pain management: pain level controlled Vital Signs Assessment: post-procedure vital signs reviewed and stable Respiratory status: patient remains intubated per anesthesia plan Cardiovascular status: stable Postop Assessment: no apparent nausea or vomiting Anesthetic complications: no   No notable events documented.  Last Vitals:  Vitals:   04/23/24 0830 04/23/24 1019  BP: 97/66   Pulse: (!) 106   Resp: (!) 21   Temp:  36.4 C  SpO2: 100%     Last Pain:  Vitals:   04/23/24 0754  TempSrc: Oral  PainSc:    Pain Goal:                   Colleen Pearson

## 2024-04-23 NOTE — Progress Notes (Signed)
 OB/GYN Faculty Attending Note  Post Op Day 0  Subjective: Patient remains intubated in ICU. Baby is in NICU.  Objective: Blood pressure (!) 85/56, pulse 97, temperature 98.5 F (36.9 C), temperature source Oral, resp. rate (!) 27, weight 70.8 kg, SpO2 100%, unknown if currently breastfeeding. Temp:  [95.8 F (35.4 C)-98.7 F (37.1 C)] 98.5 F (36.9 C) (11/02 1201) Pulse Rate:  [95-157] 97 (11/02 1415) Resp:  [8-38] 27 (11/02 1415) BP: (78-166)/(47-107) 85/56 (11/02 1415) SpO2:  [91 %-100 %] 100 % (11/02 1415) Arterial Line BP: (68-121)/(32-74) 69/48 (11/02 1415) FiO2 (%):  [50 %-100 %] 50 % (11/02 1159) Weight:  [70.8 kg] 70.8 kg (11/02 0500)  Physical Exam:  General: intubated, not responsive 2/2 sedation Chest: on ventilator Heart: regular rate  Abdomen: soft, appropriately tender to palpation, incision covered by dressing with no evidence of active bleeding  Uterine Fundus: firm, 2 fingers below the umbilicus Lochia: moderate, rubra, several small clots expressed with deep palpation and vaginal exam DVT Evaluation: no evidence of DVT Extremities: SCDs in place  UOP: > 225 mL/hr clear yellow urine  Recent Labs    04/23/24 1120 04/23/24 1259  HGB 8.2* 8.8*  HCT 24.0* 26.0*    Assessment/Plan: Patient Active Problem List   Diagnosis Date Noted   Previous cesarean delivery affecting pregnancy, antepartum 04/23/2024   Post-operative state 04/23/2024   Substance use disorder 04/23/2024   Seizure disorder during pregnancy in third trimester (HCC) 04/11/2024   Anemia in pregnancy 03/29/2024   Preterm contractions 03/02/2024   Abdominal pain affecting pregnancy 01/31/2024   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 [id number] POD#0 s/p RCS at [redacted]w[redacted]d for non-reassuring fetal heart tracing 2/2 placental abruption in the setting of patient intubated for acute on chronic seizures and per EMR, poor compliance with anti-seizure meds and substance use disorder.  She remains intubated in the ICU. Small amount of dark clots expressed from uterus with deep fundal massage but no active bleeding. Appears appropriate for postpartum state at the moment. Post op DIC panel normal.   Appreciate ICU care  Post-op - pain meds prn - heparin  per ICU protocol - fundal checks by labor RN - dressing change tomorrow  Epilepsy/status epilepticus - cont keppra  and lamictal  - per PCCM/neuro - remains on ventilator, per PCCM team  Placental abruption - immediate post op CBC appropriate - repeat tomorrow am unless indicated sooner  SUD - +cocaine, benzos on UDS - will need counseling regarding discharge planning    K. Yolanda Moats, MD, [facility] Attending Center for [facility] (Faculty Practice)  04/23/2024, 3:05 PM

## 2024-04-23 NOTE — Procedures (Signed)
 Arterial Line Insertion Start/End11/07/2023 8:20 AM, 04/23/2024 8:30 AM  Patient location: ICU. Preanesthetic checklist: patient identified and surgical consent Left, radial was placed Catheter size: 20 G Hand hygiene performed  and maximum sterile barriers used  Allen's test indicative of satisfactory collateral circulation Attempts: 2 Procedure performed using ultrasound to evaluate access site. Following insertion, dressing applied and Biopatch. Post procedure assessment: normal  Patient tolerated the procedure well with no immediate complications.

## 2024-04-23 NOTE — Plan of Care (Signed)
  Problem: Education: Goal: Ability to describe self-care measures that may prevent or decrease complications (Diabetes Survival Skills Education) will improve Outcome: Not Progressing Goal: Individualized Educational Video(s) Outcome: Not Progressing   Problem: Coping: Goal: Ability to adjust to condition or change in health will improve Outcome: Not Progressing   Problem: Fluid Volume: Goal: Ability to maintain a balanced intake and output will improve Outcome: Not Progressing   Problem: Health Behavior/Discharge Planning: Goal: Ability to identify and utilize available resources and services will improve Outcome: Not Progressing Goal: Ability to manage health-related needs will improve Outcome: Not Progressing   Problem: Metabolic: Goal: Ability to maintain appropriate glucose levels will improve Outcome: Not Progressing   Problem: Nutritional: Goal: Maintenance of adequate nutrition will improve Outcome: Not Progressing Goal: Progress toward achieving an optimal weight will improve Outcome: Not Progressing   Problem: Skin Integrity: Goal: Risk for impaired skin integrity will decrease Outcome: Not Progressing   Problem: Tissue Perfusion: Goal: Adequacy of tissue perfusion will improve Outcome: Not Progressing   Problem: Education: Goal: Knowledge of General Education information will improve Description: Including pain rating scale, medication(s)/side effects and non-pharmacologic comfort measures Outcome: Not Progressing   Problem: Health Behavior/Discharge Planning: Goal: Ability to manage health-related needs will improve Outcome: Not Progressing   Problem: Clinical Measurements: Goal: Ability to maintain clinical measurements within normal limits will improve Outcome: Not Progressing Goal: Will remain free from infection Outcome: Not Progressing Goal: Diagnostic test results will improve Outcome: Not Progressing Goal: Respiratory complications will  improve Outcome: Not Progressing Goal: Cardiovascular complication will be avoided Outcome: Not Progressing   Problem: Activity: Goal: Risk for activity intolerance will decrease Outcome: Not Progressing   Problem: Nutrition: Goal: Adequate nutrition will be maintained Outcome: Not Progressing   Problem: Coping: Goal: Level of anxiety will decrease Outcome: Not Progressing   Problem: Elimination: Goal: Will not experience complications related to bowel motility Outcome: Not Progressing Goal: Will not experience complications related to urinary retention Outcome: Not Progressing   Problem: Pain Managment: Goal: General experience of comfort will improve and/or be controlled Outcome: Not Progressing   Problem: Safety: Goal: Ability to remain free from injury will improve Outcome: Not Progressing   Problem: Skin Integrity: Goal: Risk for impaired skin integrity will decrease Outcome: Not Progressing   Problem: Safety: Goal: Non-violent Restraint(s) Outcome: Not Progressing

## 2024-04-23 NOTE — Progress Notes (Addendum)
 eLink Physician-Brief Progress Note Patient Name: Colleen Pearson DOB: Sep 20, 1990 MRN: [id number]   Date of Service  04/23/2024  HPI/Events of Note  33/F with history of substance abuse, seizure disorder (poorly compliant with medications),  currently [redacted] weeks pregnant, who presents to the ED with seizures.  She had been given IV midazolam  and magnesium , and was intubated for airway protection.   eICU Interventions  Status epilepticus - Current seizure episodes attributed to poor medication compliance.  - Loaded with keppra .  - Currently sedated with midazolam  infusion.  - Maintain on continuous EEG.   Acute respiratory failure - TV 4-35ml/kg PBW.  - Titrate FiO2, PEEP to maintain SpO2 >92% - Serial ABG, will make further vent setting changes as warranted.  - Started on empiric unasyn for possible aspiration pneumonia.  - Will follow cultures and adjust antibiotics as warranted  Pregnancy, 30 weeks AOG - OB consulted.  - Will continue fetal monitoring for now.          Tahmid Stonehocker M DELA CRUZ 04/23/2024, 4:32 AM  6:26 AM Pt with significant vent dyssynchrony, stacking breaths, setting off pressure and volume alarms.  On max dose fentanyl , versed .  Will add propofol  for sedation.  Will continue to monitor closely.

## 2024-04-23 NOTE — Procedures (Signed)
 Arterial Catheter Insertion Procedure Note  Colleen Pearson  [id number]  [birth year]  Date:04/23/24  Time:1:03 PM    Provider Performing: Zola SAILOR Karthikeya Funke    Procedure: Insertion of Arterial Line (63379) with US  guidance (23062)   Indication(s) Blood pressure monitoring and/or need for frequent ABGs  Consent Risks of the procedure as well as the alternatives and risks of each were explained to the patient and/or caregiver.  Consent for the procedure was obtained and is signed in the bedside chart  Anesthesia None   Time Out Verified patient identification, verified procedure, site/side was marked, verified correct patient position, special equipment/implants available, medications/allergies/relevant history reviewed, required imaging and test results available.   Sterile Technique Maximal sterile technique including full sterile barrier drape, hand hygiene, sterile gown, sterile gloves, mask, hair covering, sterile ultrasound probe cover (if used).   Procedure Description Area of catheter insertion was cleaned with chlorhexidine  and draped in sterile fashion. With real-time ultrasound guidance an arterial catheter was placed into the left radial artery.  Appropriate arterial tracings confirmed on monitor.     Complications/Tolerance None; patient tolerated the procedure well.   EBL Minimal   Specimen(s) None

## 2024-04-23 NOTE — Progress Notes (Signed)
 Received a call from Colleen Pearson. Pt's FHR is dropping. Variable decel noted on the tracing down to the 70's with quick return to baseline. 9189- I was at the pt's bedside on Colleen Pearson. FHR was in the 130's. The pt was propped over onto her left side. The pt began having repetative lates. 9187 I was called and told that Dr. Ileana, MFM, was trying to reach me. I called him back and told him that the FHR tracing was showing varibles and recurrent lates decels. I told him Burnard Moats is the [facility] attending on. 58- I called Dr. Moats and told her that the FHR tracing was showing variable decels and recurrent lates. She said that she and Dr. Izell were on their way. Dr. Izell had notified the OB OR charge nurse to set up for an urgent C/S. Dr. Tilford, [facility] anesthesiologist is on the unit. 6- We are on the way to the OB OR. Myself, Dr. Tilford, Rock Moos CRNA, her ICU RN, and respiratory therapy.9162- We are in the OR. FHR is in the 130's. Pt being prepped for C/S.

## 2024-04-23 NOTE — Progress Notes (Addendum)
 NAME:  Colleen Pearson, MRN:  [id number], DOB:  11-16-1990, LOS: 0 ADMISSION DATE:  04/23/2024, CONSULTATION DATE:  04/22/2024 REFERRING MD:  Oneil Budge, MD, CHIEF COMPLAINT:  Status Epilepticus   History of Present Illness:  33 y/o female with h/o Epilepsy and Polysubstance d/o who is a [id number] and is currently [redacted] weeks pregnant who was on the cough and then have several seizures.  EMS gave 4mg  IV Magnesium  and 5mg  IV Midazolam .  She was lethargic with secretions not able to protect her airways and she was intubated in the ED.  She has been started on Versed  drip.  She was loaded with Keppra .  There has been reports of non-compliance with her seizure meds-Keppra  and Lamictal .  She also had low levels as outpatient.  She has seizures in September as well, similar situation and apparently has a seizure two weeks prior to that ED visit.  Pertinent  Medical History  Epilepsy and Polysubstance d/o, CF carrier  Significant Hospital Events: Including procedures, antibiotic start and stop dates in addition to other pertinent events   11/1: transfer to [facility] from [facility]  Interim History / Subjective:  Last abg  7.238/45.9/90/21/8/19.6  On 10 midaz 125 fent   Objective    Blood pressure (!) 88/54, pulse (!) 118, temperature 98.4 F (36.9 C), temperature source Oral, resp. rate 20, weight 70.8 kg, SpO2 98%.    Vent Mode: PRVC FiO2 (%):  [80 %-100 %] 80 % Set Rate:  [20 bmp-26 bmp] 26 bmp Vt Set:  [400 mL-450 mL] 450 mL PEEP:  [8 cmH20] 8 cmH20 Plateau Pressure:  [28 cmH20-35 cmH20] 35 cmH20   Intake/Output Summary (Last 24 hours) at 04/23/2024 0758 Last data filed at 04/23/2024 0700 Gross per 24 hour  Intake 291.79 ml  Output 50 ml  Net 241.79 ml   Filed Weights   04/23/24 0500  Weight: 70.8 kg    Examination: General: Critically ill appearing young adult F intubated sedated. Rotated toward her right side.   Neuro: awakens when moved. Pinpoint pupils  HENT: C collar. ETT secure Some  dried bloody secretions on mouth and dried brown matter in hair  Lungs: Mechanically ventilated. Stacking.  Cardiovascular: tachycardic regular  Abdomen: Round/gravid. Fetal monitor in place  GU: foley w flood tinged urine  Extremities: no obvious acute joint deformity    Resolved problem list   Assessment and Plan   [id number] at 32/0  P -per OB. Cont fetal monitoring. Trying to get in touch w rapid OB -- as fetal HR has transiently gone <80. I have spoken w MFM to notify him and they are trying to reach [facility] team as well.  -we have repositioned off of R side   -OB had discussed case w MFM overnight    Sz disorder w status epilepticus Substance use disorder  -MRI w/o acute abnormality  -CT c spine without abnormality and there is no reported fall/trauma in review of [facility] notes and our ntoes  P -awaiting cEEG availablity -neuro following, cont keppra , lamictal   -we are incr versed  to 15 now and starting prop -- she wakes up and we are having vent compliance issues  -dc C collar   Acute hypoxic and hypercarbic respiratory failure Aspiration PNA  P -STAT ABG -art line -incr sedation as above, will likely need to change Vt  -cont unasyn, follow trach asp   Hypotension -medication related v undifferentiated shock state P -CVC art line -- consented pt mother via phone -STAT CBC, CMP,  ABG -start NE   Metabolic acidosis -likely 2/2 sz P -repeat CMP   Hypokalemia P -2g mag, 4 runs K, repeat CMP as above    Labs   CBC: Recent Labs  Lab 04/22/24 2033 04/23/24 0103 04/23/24 0143  WBC 20.9*  --  4.5  HGB 11.1* 10.2* 11.2*  HCT 34.5* 30.0* 32.7*  MCV 90.3  --  85.6  PLT 482*  --  266    Basic Metabolic Panel: Recent Labs  Lab 04/22/24 2033 04/23/24 0103 04/23/24 0143  NA 135 137 135  K 3.2* 3.2* 3.1*  CL 103  --  107  CO2 15*  --  19*  GLUCOSE 273*  --  254*  BUN 6  --  5*  CREATININE 0.96  --  0.94  CALCIUM  8.3*  --  7.2*   GFR: Estimated Creatinine  Clearance: 76.6 mL/min (by C-G formula based on SCr of 0.94 mg/dL). Recent Labs  Lab 04/22/24 2033 04/23/24 0143  WBC 20.9* 4.5    Liver Function Tests: Recent Labs  Lab 04/22/24 2033 04/23/24 0143  AST 41 33  ALT 18 16  ALKPHOS 167* 131*  BILITOT 0.5 0.6  PROT 6.7 5.0*  ALBUMIN 2.9* 2.1*   No results for input(s): LIPASE, AMYLASE in the last 168 hours. No results for input(s): AMMONIA  in the last 168 hours.  ABG    Component Value Date/Time   PHART 7.29 (L) 04/23/2024 0440   PCO2ART 38 04/23/2024 0440   PO2ART 286 (H) 04/23/2024 0440   HCO3 18.3 (L) 04/23/2024 0440   TCO2 21 (L) 04/23/2024 0103   ACIDBASEDEF 7.7 (H) 04/23/2024 0440   O2SAT 99.8 04/23/2024 0440     Coagulation Profile: No results for input(s): INR, PROTIME in the last 168 hours.  Cardiac Enzymes: No results for input(s): CKTOTAL, CKMB, CKMBINDEX, TROPONINI in the last 168 hours.  HbA1C: Hgb A1c MFr Bld  Date/Time Value Ref Range Status  01/11/2024 10:46 AM 4.8 4.8 - 5.6 % Final    Comment:             Prediabetes: 5.7 - 6.4          Diabetes: >6.4          Glycemic control for adults with diabetes: <7.0   02/22/2023 02:45 AM 5.0 4.8 - 5.6 % Final    Comment:    (NOTE) Pre diabetes:          5.7%-6.4%  Diabetes:              >6.4%  Glycemic control for   <7.0% adults with diabetes     CBG: Recent Labs  Lab 04/22/24 2105 04/23/24 0718  GLUCAP 254* 111*     CRITICAL CARE Performed by: Ronnald FORBES Gave   Total critical care time: 52 minutes  Critical care time was exclusive of separately billable procedures and treating other patients.  Critical care was necessary to treat or prevent imminent or life-threatening deterioration.  Critical care was time spent personally by me on the following activities: development of treatment plan with patient and/or surrogate as well as nursing, discussions with consultants, evaluation of patient's response to treatment,  examination of patient, obtaining history from patient or surrogate, ordering and performing treatments and interventions, ordering and review of laboratory studies, ordering and review of radiographic studies, pulse oximetry and re-evaluation of patient's condition.  Ronnald Gave MSN, AGACNP-BC  Pulmonary/Critical Care Medicine Amion for pager  04/23/2024, 7:58 AM

## 2024-04-23 NOTE — Consult Note (Addendum)
 NEUROLOGY CONSULT NOTE   Date of service: April 23, 2024 Patient Name: Colleen Pearson MRN:  [id number] DOB:  01-30-1991 Chief Complaint: Seizures Requesting Provider: Maree Harder, MD  History of Present Illness  Colleen Pearson is a 33 y.o. female with hx of seizure disorder on lamotrigine  200 twice daily and Keppra  1 g twice daily he was currently [redacted] weeks pregnant and presenting with breakthrough seizures.  Due to aspiration recurrent seizures, she was intubated at [facility].  A rapid EEG was obtained while she was there which did not reveal any ongoing seizure activity.  She was started on Versed  at a rate of 10 mg/h and transferred to [facility] for further evaluation.  There have been concerns for the fetus, but after discussion with OB.  The patient has a history of epilepsy, and has been on lamotrigine , increased to 150 twice daily in July and apparently is now on 200 twice daily.  She had a level done on her lamotrigine  on 10/21 which was 2.4, unclear if the increase was after this.  Her levetiracetam  level at that time was 25.    Past History   Past Medical History:  Diagnosis Date   Anemia    Asthma    Cocaine use complicating pregnancy in third trimester 08/30/2017   Endometriosis    Nexplanon  in place 11/04/2020   Ovarian cyst    Seizures (HCC)    Status post cesarean section 08/30/2017    Past Surgical History:  Procedure Laterality Date   CESAREAN SECTION N/A 08/30/2017   Procedure: CESAREAN SECTION;  Surgeon: Leonce Garnette BIRCH, MD;  Location: [facility] ORS;  Service: Obstetrics;  Laterality: N/A;   LASER ABLATION/CAUTERIZATION OF ENDOMETRIAL IMPLANTS      Family History: Family History  Problem Relation Age of Onset   Down syndrome Sister     Social History  reports that she has quit smoking. Her smoking use included cigarettes and e-cigarettes. She has never used smokeless tobacco. She reports that she does not currently use drugs after having used the  following drugs: Marijuana and Cocaine. She reports that she does not drink alcohol.  Allergies  Allergen Reactions   Lamotrigine  Hives and Swelling    Pt is currently taking this medication   Penicillins Hives and Swelling    Tolerated Zosyn  02/24/23   Ultram [Tramadol] Hives and Swelling    Medications   Current Facility-Administered Medications:    fentaNYL  (SUBLIMAZE ) bolus via infusion 25-100 mcg, 25-100 mcg, Intravenous, Q15 min PRN, Smith, Joshua C, NP   fentaNYL  in NS 250mL (61mcg/ml) infusion-PREMIX, 0-400 mcg/hr, Intravenous, Continuous, Claudene Chew C, NP, Last Rate: 5 mL/hr at 04/23/24 0057, 50 mcg/hr at 04/23/24 0057   midazolam  (VERSED ) 100 mg/100 mL (1 mg/mL) premix infusion, 0.5-10 mg/hr, Intravenous, Continuous, Maree Harder, MD, Last Rate: 10 mL/hr at 04/23/24 0048, 10 mg/hr at 04/23/24 0048  Current Outpatient Medications:    ferrous sulfate  325 (65 FE) MG EC tablet, Take 325 mg by mouth 2 (two) times daily before a meal., Disp: , Rfl:    folic acid  (FOLVITE ) 1 MG tablet, Take 1 tablet (1 mg total) by mouth daily. (Patient not taking: Reported on 04/11/2024), Disp: 30 tablet, Rfl: 10   lamoTRIgine  (LAMICTAL ) 200 MG tablet, Take 1 tablet (200 mg total) by mouth 2 (two) times daily., Disp: 60 tablet, Rfl: 3   levETIRAcetam  (KEPPRA ) 1000 MG tablet, Take 1 tablet (1,000 mg total) by mouth 2 (two) times daily., Disp: 60 tablet, Rfl: 0  ondansetron  (ZOFRAN -ODT) 4 MG disintegrating tablet, Take 1 tablet (4 mg total) by mouth every 8 (eight) hours as needed., Disp: 30 tablet, Rfl: 1   Prenatal Vit-Fe Fumarate-FA (PRENATAL MULTIVITAMIN) TABS tablet, Take 1 tablet by mouth daily at 12 noon., Disp: 90 tablet, Rfl: 3  Vitals   Vitals:   04/23/24 0035 04/23/24 0045 04/23/24 0100 04/23/24 0115  BP: 130/74 135/78 (!) 146/71   Pulse: (!) 145 (!) 153 (!) 156 (!) 138  Resp: (!) 32 (!) 36 (!) 26 (!) 28  Temp: 98 F (36.7 C) 98.1 F (36.7 C) 98.4 F (36.9 C) 98.2 F (36.8  C)  SpO2: 96% 96% 98% 98%    There is no height or weight on file to calculate BMI.   Physical Exam   She is currently intubated and sedated on 10 mg/h of Versed   Neurologic Examination   She awakens easily, follows commands to wiggle toes and show thumbs bilaterally.  Eyes are midline, she crosses in both directions, pupils are equal round and reactive.  Facial symmetry is difficult to judge given intubation.  Labs/Imaging/Neurodiagnostic studies   CBC:  Recent Labs  Lab Apr 28, 2024 2033 04/23/24 0103  WBC 20.9*  --   HGB 11.1* 10.2*  HCT 34.5* 30.0*  MCV 90.3  --   PLT 482*  --    Basic Metabolic Panel:  Lab Results  Component Value Date   NA 137 04/23/2024   K 3.2 (L) 04/23/2024   CO2 15 (L) 28-Apr-2024   GLUCOSE 273 (H) Apr 28, 2024   BUN 6 28-Apr-2024   CREATININE 0.96 04/28/2024   CALCIUM  8.3 (L) 28-Apr-2024   GFRNONAA >60 04/28/24   GFRAA >60 07/21/2018   Lipid Panel:  Lab Results  Component Value Date   LDLCALC 100 (H) April 28, 2024   HgbA1c:  Lab Results  Component Value Date   HGBA1C 4.8 01/11/2024   Urine Drug Screen:     Component Value Date/Time   LABOPIA NONE DETECTED 02/22/2023 0037   COCAINSCRNUR NONE DETECTED 02/22/2023 0037   COCAINSCRNUR POSITIVE (A) 08/21/2021 0220   LABBENZ POSITIVE (A) 02/22/2023 0037   AMPHETMU NONE DETECTED 02/22/2023 0037   THCU POSITIVE (A) 02/22/2023 0037   LABBARB NONE DETECTED 02/22/2023 0037    Alcohol Level     Component Value Date/Time   ETH <10 02/22/2023 0007   AED levels:  Lab Results  Component Value Date   LAMOTRIGINE  2.4 04/11/2024   LEVETIRACETA 25.0 04/11/2024    CT head from yesterday is negative  ASSESSMENT   Colleen Pearson is a 33 y.o. female with breakthrough seizure in the setting of known seizure disorder and pregnancy.  Keppra  is cleared fairly quickly during pregnancy, and I do wonder if this played a role in her presentation.  Levels were not checked prior to loading her with  Keppra , and I doubt that either Keppra  or lamotrigine  levels would be helpful at this time.  She will need to have these followed closely as an outpatient.  I would also favor 3 times daily dosing of Keppra  or consideration of Keppra  XR during this timeframe.  I doubt that this represents eclampsia, but an MRI could be helpful to differentiate eclampsia.  If it does show signs of posterior reversible encephalopathy syndrome, could consider eclampsia as etiology.  Certainly the cocaine that is positive in her UDS could have played a role as well.  RECOMMENDATIONS  Keppra  750 every 6 hours until extubated, at that time could do 1500 mg twice daily  of XR Lamotrigine  200 mg twice daily MRI brain LTM EEG while intubated Neurology will follow ______________________________________________________________________ This patient is critically ill and at significant risk of neurological worsening, death and care requires constant monitoring of vital signs, hemodynamics,respiratory and cardiac monitoring, neurological assessment, discussion with family, other specialists and medical decision making of high complexity. I spent 50 minutes of neurocritical care time  in the care of  this patient. This was time spent independent of any time provided by nurse practitioner or PA.  Aisha Seals, MD [facility]   If 7pm- 7am, please page neurology on call as listed in AMION. 05/04/2024  4:04 PM

## 2024-04-23 NOTE — Anesthesia Preprocedure Evaluation (Signed)
 Anesthesia Evaluation  Patient identified by MRN, date of birth, ID band Patient unresponsive    Reviewed: Unable to perform ROS - Chart review onlyPreop documentation limited or incomplete due to emergent nature of procedure.  Airway Mallampati: Intubated       Dental no notable dental hx.    Pulmonary former smoker   Pulmonary exam normal        Cardiovascular  Rhythm:Regular Rate:Tachycardia     Neuro/Psych    GI/Hepatic   Endo/Other    Renal/GU      Musculoskeletal   Abdominal   Peds  Hematology   Anesthesia Other Findings   Reproductive/Obstetrics                              Anesthesia Physical Anesthesia Plan  ASA: 4 and emergent  Anesthesia Plan: General   Post-op Pain Management:    Induction: Intravenous  PONV Risk Score and Plan: 3 and Ondansetron  and Treatment may vary due to age or medical condition  Airway Management Planned: Oral ETT  Additional Equipment: Arterial line  Intra-op Plan:   Post-operative Plan: Post-operative intubation/ventilation  Informed Consent:      Only emergency history available and History available from chart only  Plan Discussed with: CRNA  Anesthesia Plan Comments:          Anesthesia Quick Evaluation

## 2024-04-23 NOTE — Op Note (Addendum)
 Operative Note   SURGERY DATE: 04/23/2024  PRE-OP DIAGNOSIS:  *Pregnancy at 32/0 *Recurrent late fetal heart rate decelerations *Acute on chronic seizure disorder *Positive cocaine on urine drug screen *Non-improvement in maternal status with patient currently intubated  POST-OP DIAGNOSIS: Same. Delivered. Placental abruption   PROCEDURE: Urgent repeat low transverse cesarean section via Marty Schneider skin incision with double layer uterine closure  SURGEON: Surgeons and Role:    DEWAINE Izell Harari, MD - Primary  ASSISTANT:    DEWAINE Nicholaus Burnard CHRISTELLA, MD - Assisting  An experienced assistant was required given the standard of surgical care given the complexity of the case.  This assistant was needed for exposure, dissection, suctioning, retraction, instrument exchange, assisting with delivery with administration of fundal pressure, and for overall help during the procedure.  ANESTHESIA: general   ESTIMATED BLOOD LOSS:  DRAINS: per anesthesia note Prior to surgery, patient with gross hematuria and UOP clearing at the end of the procedure.  TOTAL IV FLUIDS: per anesthesia note.   VTE PROPHYLAXIS: SCDs to bilateral lower extremities  ANTIBIOTICS: Gentamicin  and clindamycin  given intraoperatively; patient on unasyn already for pneumonia.   SPECIMENS: placenta to pathology  COMPLICATIONS: none  FINDINGS: There were no intra-abdominal adhesions. Grossly normal uterus, tubes and ovaries. Clear amniotic fluid, large retroplacental clots, cephalic, female infant, weight 8229hf, APGARs 3/6/8, intact placenta. Arterial: pH 7.15/co2 69/bicarb 24/A-B deficit 6.1 Venous: pH 7.16/co2 61/bicarb 21.7/A-B deficit 7.8  PROCEDURE IN DETAIL: The patient was taken to the operating room where anesthesia was administered and normal fetal heart tones were confirmed. She was then prepped and draped in the normal fashion in the dorsal supine position with a leftward tilt.  After a time out was performed, a  the skin incision was made with the scalpel and carried through to the underlying layer of fascia. The fascia was then incised at the midline and this incision was extended bluntly, and the rectus muscles were then separated in the midline and the peritoneum was entered bluntly. The bladder blade was inserted and the vesicouterine peritoneum was identified, tented up and entered with the metzenbaum scissors. This incision was extended laterally and the bladder flap was created digitally. The bladder blade was reinserted.  A low transverse hysterotomy was made with the scalpel until the endometrial cavity was breached and the amniotic sac ruptured, yielding  amniotic fluid. This incision was extended bluntly and the infant's head, shoulders and body were delivered atraumatically.The cord was clamped x 2 and cut, and the infant was handed to the awaiting pediatricians, after delayed cord clamping was not done.  The placenta was then gradually expressed from the uterus and then the uterus was exteriorized and cleared of all clots and debris. The hysterotomy was repaired with a running suture of 1-0 monocryl. A second imbricating layer of 1-0 monocryl  suture was then placed to achieve excellent hemostasis.   The uterus and adnexa were then returned to the abdomen, and the hysterotomy and all operative sites were reinspected and excellent hemostasis was noted after irrigation and suction of the abdomen with warm saline.  The peritoneum was closed with a running stitch of 3-0 Vicryl. The fascia was reapproximated with 0 Vicryl in a simple running fashion bilaterally. The subcutaneous layer was then reapproximated with interrupted sutures of 2-0 plain gut, and the skin was then closed with 4-0 monocryl, in a subcuticular fashion.  The patient  tolerated the procedure well. Sponge, lap, needle, and instrument counts were correct x 2. The patient  was transferred to the recovery room awake, alert and breathing  independently in stable condition.  Bebe Izell Overcast MD Attending Center for [facility])

## 2024-04-23 NOTE — Progress Notes (Signed)
 Bleeding assessed as she had small clot expressed earlier with exam. Scant red blood on pad, fundus firm, no additional clots expressed. Bleeding stable.  LOIS Yolanda Moats, MD, [facility] Attending Center for [facility])

## 2024-04-23 NOTE — Procedures (Addendum)
 Central Venous Catheter Insertion Procedure Note  KJERSTI DITTMER  [id number]  10-Jul-1990  Date:04/23/24  Time:5:15 PM   Provider Performing:Tamari Redwine N Mylah Baynes   Procedure: Insertion of Non-tunneled Central Venous Catheter(36556) with US  guidance (23062)   Indication(s) Medication administration and Difficult access  Consent Risks of the procedure as well as the alternatives and risks of each were explained to the patient and/or caregiver.  Consent for the procedure was obtained and is signed in the bedside chart  Anesthesia Topical only with 1% lidocaine    Timeout Verified patient identification, verified procedure, site/side was marked, verified correct patient position, special equipment/implants available, medications/allergies/relevant history reviewed, required imaging and test results available.  Sterile Technique Maximal sterile technique including full sterile barrier drape, hand hygiene, sterile gown, sterile gloves, mask, hair covering, sterile ultrasound probe cover (if used).  Procedure Description Area of catheter insertion was cleaned with chlorhexidine  and draped in sterile fashion.  With real-time ultrasound guidance a central venous catheter was placed into the right internal jugular vein. Nonpulsatile blood flow and easy flushing noted in all ports.  The catheter was sutured in place and sterile dressing applied.  Complications/Tolerance None; patient tolerated the procedure well. Chest X-ray is ordered to verify placement for internal jugular or subclavian cannulation.   Chest x-ray is not ordered for femoral cannulation.  EBL Minimal  Specimen(s) None

## 2024-04-23 NOTE — Progress Notes (Addendum)
 NEUROLOGY CONSULT FOLLOW UP NOTE   Date of service: April 23, 2024 Patient Name: Colleen Pearson MRN:  [id number] DOB:  Sep 06, 1990  Interval Hx/subjective  Seen and examined after she underwent an emergent C-section for recurrent late fetal heart rate decelerations.  Vitals   Vitals:   04/23/24 1159 04/23/24 1200 04/23/24 1201 04/23/24 1215  BP:  103/69  100/71  Pulse:  (!) 101  (!) 104  Resp:  (!) 26  (!) 23  Temp:   98.5 F (36.9 C)   TempSrc:   Oral   SpO2: 98% 100%  99%  Weight:         Body mass index is 29.51 kg/m.  Physical Exam  General: Sedated, intubated HEENT: Normocephalic, neck in c-collar CVS: Regular rate rhythm Respiratory: Vented Neurological exam Limited exam immediately postop Sedated with Versed , propofol  and fentanyl  Breathing with the ventilator Pinpoint pupils No corneal response No spontaneous movement No response to noxious stimulation  Medications  Current Facility-Administered Medications:    Place/Maintain arterial line, , , Until Discontinued **AND** 0.9 %  sodium chloride  infusion, , Intra-arterial, PRN, Bowser, Grace E, NP   0.9 %  sodium chloride  infusion, 250 mL, Intravenous, Continuous, Hattar, Zola SAILOR, MD   Ampicillin-Sulbactam (UNASYN) 3 g in sodium chloride  0.9 % 100 mL IVPB, 3 g, Intravenous, Q6H, Maree Harder, MD, Stopped at 04/23/24 0501   Chlorhexidine  Gluconate Cloth 2 % PADS 6 each, 6 each, Topical, Daily, Maree Harder, MD   dexamethasone  (DECADRON ) injection 20 mg, 20 mg, Intravenous, Q24H, 20 mg at 04/23/24 1207 **FOLLOWED BY** [START ON 04/28/2024] dexamethasone  (DECADRON ) injection 10 mg, 10 mg, Intravenous, Q24H, Bowser, Grace E, NP   famotidine  (PEPCID ) tablet 20 mg, 20 mg, Per Tube, BID, Maree Harder, MD, 20 mg at 04/23/24 1223   fentaNYL  (SUBLIMAZE ) bolus via infusion 25-100 mcg, 25-100 mcg, Intravenous, Q15 min PRN, Claudene Chew C, NP, 100 mcg at 04/23/24 0724   fentaNYL  in NS (73mcg/ml)  infusion-PREMIX, 0-400 mcg/hr, Intravenous, Continuous, Claudene Chew C, NP, Last Rate: 25 mL/hr at 04/23/24 1200, 250 mcg/hr at 04/23/24 1200   heparin  injection 5,000 Units, 5,000 Units, Subcutaneous, Q8H, Maree Harder, MD   insulin  aspart (novoLOG ) injection 0-15 Units, 0-15 Units, Subcutaneous, Q4H, Maree Harder, MD, 2 Units at 04/23/24 1205   lamoTRIgine  (LAMICTAL ) tablet 200 mg, 200 mg, Per Tube, BID, Michaela Aisha SQUIBB, MD, 200 mg at 04/23/24 1224   levETIRAcetam  (KEPPRA ) undiluted injection 750 mg, 750 mg, Intravenous, Q6H, Michaela Aisha SQUIBB, MD, 750 mg at 04/23/24 1017   midazolam  (VERSED ) 100 mg/100 mL (1 mg/mL) premix infusion, 0.5-15 mg/hr, Intravenous, Continuous, Bowser, Grace E, NP, Last Rate: 13 mL/hr at 04/23/24 1200, 13 mg/hr at 04/23/24 1200   norepinephrine  (LEVOPHED ) 4mg  in (0.016 mg/mL) premix infusion, 0-10 mcg/min, Intravenous, Titrated, Hattar, Zola SAILOR, MD, Last Rate: 15 mL/hr at 04/23/24 1200, 4 mcg/min at 04/23/24 1200   ondansetron  (ZOFRAN ) injection 4 mg, 4 mg, Intravenous, Q6H PRN, Maree Harder, MD   Oral care mouth rinse, 15 mL, Mouth Rinse, Q2H, Maree Harder, MD, 15 mL at 04/23/24 1218   Oral care mouth rinse, 15 mL, Mouth Rinse, PRN, Maree Harder, MD, 15 mL at 04/23/24 0546   oxytocin  (PITOCIN ) IV infusion 30 units in NS 500 mL - Premix, 41.7 mL/hr, Intravenous, Continuous, Tilford Franky BIRCH, MD, Last Rate: 41.7 mL/hr at 04/23/24 1200, 41.7 mL/hr at 04/23/24 1200   potassium chloride  10 mEq in 100 mL IVPB, 10 mEq, Intravenous, Q1 Hr  x 4, Bowser, Ronnald BRAVO, NP, Last Rate: 100 mL/hr at 04/23/24 1200, Infusion Verify at 04/23/24 1200   propofol  (DIPRIVAN ) 1000 MG/100ML infusion, 0-80 mcg/kg/min, Intravenous, Titrated, Mallory Sheela Kieth CHRISTELLA, MD, Last Rate: 6.37 mL/hr at 04/23/24 1200, 15 mcg/kg/min at 04/23/24 1200  Labs and Diagnostic Imaging   CBC:  Recent Labs  Lab 04/23/24 0752 04/23/24 0850 04/23/24 0928 04/23/24 1055 04/23/24 1120  WBC 6.5 8.4  --    --   --   NEUTROABS  --  7.6  --   --   --   HGB 10.5* 9.8* 8.8*  --  8.2*  HCT 29.7* 28.3* 26.0*  --  24.0*  MCV 83.2 84.7  --   --   --   PLT 250 205  --  208  --     Basic Metabolic Panel:  Lab Results  Component Value Date   NA 138 04/23/2024   K 3.5 04/23/2024   CO2 17 (L) 04/23/2024   GLUCOSE 119 (H) 04/23/2024   BUN 9 04/23/2024   CREATININE 1.00 04/23/2024   CALCIUM  7.6 (L) 04/23/2024   GFRNONAA >60 04/23/2024   GFRAA >60 07/21/2018   Lipid Panel:  Lab Results  Component Value Date   LDLCALC 100 (H) 04/22/2024   HgbA1c:  Lab Results  Component Value Date   HGBA1C 3.8 (L) 04/23/2024   Urine Drug Screen:     Component Value Date/Time   LABOPIA NONE DETECTED 04/23/2024 0107   COCAINSCRNUR POSITIVE (A) 04/23/2024 0107   COCAINSCRNUR POSITIVE (A) 08/21/2021 0220   LABBENZ POSITIVE (A) 04/23/2024 0107   AMPHETMU NONE DETECTED 04/23/2024 0107   THCU POSITIVE (A) 04/23/2024 0107   LABBARB NONE DETECTED 04/23/2024 0107    Alcohol Level     Component Value Date/Time   ETH <10 02/22/2023 0007   INR  Lab Results  Component Value Date   INR 1.1 04/23/2024   APTT  Lab Results  Component Value Date   APTT 26 04/23/2024   AED levels:  Lab Results  Component Value Date   LAMOTRIGINE  2.4 04/11/2024   LEVETIRACETA 25.0 04/11/2024    CT Head without contrast(Personally reviewed): Negative  MRI Brain(Personally reviewed): No acute findings  rEEG:  General irregular slow activity.  No seizures  Assessment   Colleen Pearson is a 33 y.o. female past history of seizure, polysubstance abuse who is [redacted] weeks pregnant, brought in for breakthrough seizure.  UDS cocaine positive. Keppra  dose increased to 750 mg every 6 hours, lamotrigine  200 twice daily. MRI of the brain does not show any evidence of posterior reversible encephalopathy syndrome or any acute abnormalities. Was taken for emergent C-section due to fetal heart decelerations. Remains intubated  and with a limited exam on sedation  Impression: Breakthrough seizure, likely in the setting of illicit substance use.  Less likely to be eclampsia.  Recommendations  She has now had a C-section and delivered a baby. Keppra  dose can be made twice daily although few days postpartum, the volume of distribution and metabolism still might make the need of Keppra  dose be high but she is not seizing on her EEG. I would recommend keeping her on Keppra  1500 twice daily for now and after she is extubated, reduce the dose to her home dose of 1000 twice daily, while she is on LTM. Continue her lamotrigine  at 200 twice daily. Maintain seizure precautions As far as her sedation goes, I would like to reduce her Versed  by 1 mg every hour  so that she is off of Versed  by tomorrow. If she needs sedation for clinical seizure activity or vent management, there is room to go up on propofol . She remains on LTM-we will watch her on LTM, till there is a good clinical exam and medication dosages have been adjusted-I would expect an the next 1 to 2 days. Neurology will follow Plan discussed with Dr. Nicholaus OB/[facility] and Ronnald Gave, PCCM ______________________________________________________________________   Signed, Eligio Lav, MD [facility]  CRITICAL CARE ATTESTATION Performed by: Eligio Lav, MD Total critical care time: 55 minutes Critical care time was exclusive of separately billable procedures and treating other patients and/or supervising APPs/Residents/Students Critical care was necessary to treat or prevent imminent or life-threatening deterioration. This patient is critically ill and at significant risk for neurological worsening and/or death and care requires constant monitoring. Critical care was time spent personally by me on the following activities: development of treatment plan with patient and/or surrogate as well as nursing, discussions with consultants, evaluation of patient's  response to treatment, examination of patient, obtaining history from patient or surrogate, ordering and performing treatments and interventions, ordering and review of laboratory studies, ordering and review of radiographic studies, pulse oximetry, re-evaluation of patient's condition, participation in multidisciplinary rounds and medical decision making of high complexity in the care of this patient.

## 2024-04-23 NOTE — Plan of Care (Signed)
  Problem: Health Behavior/Discharge Planning: Goal: Ability to manage health-related needs will improve Outcome: Progressing   Problem: Metabolic: Goal: Ability to maintain appropriate glucose levels will improve Outcome: Progressing   Problem: Skin Integrity: Goal: Risk for impaired skin integrity will decrease Outcome: Progressing

## 2024-04-23 NOTE — Brief Op Note (Addendum)
 04/23/2024  9:19 AM  PATIENT:  Colleen Pearson  33 y.o. female  PRE-OPERATIVE DIAGNOSIS:  Primary Cesarean Section for Recurrent seizures and variables  POST-OPERATIVE DIAGNOSIS:  Primary Cesarean Section for Recurrent seizures and variables  PROCEDURE:  Procedure(s): CESAREAN DELIVERY (N/A)  SURGEON:  Surgeons and Role:    * Izell Harari, MD - Primary    * Nicholaus Burnard HERO, MD - Assisting  PHYSICIAN ASSISTANT:   ASSISTANTS: none   ANESTHESIA:   general  EBL:  800 mL   BLOOD ADMINISTERED:none  DRAINS: Urinary Catheter (Foley)   LOCAL MEDICATIONS USED:  NONE  SPECIMEN:  Source of Specimen:  placenta  DISPOSITION OF SPECIMEN:  PATHOLOGY  COUNTS:  YES  TOURNIQUET:  * No tourniquets in log *  DICTATION: .Note written in EPIC  PLAN OF CARE: back to ICU  PATIENT DISPOSITION:  ICU - intubated and hemodynamically stable.   Delay start of Pharmacological VTE agent (>24hrs) due to surgical blood loss or risk of bleeding: no

## 2024-04-23 NOTE — Consult Note (Signed)
 Obstetrics Consult Note   Date of Admission: 04/23/2024   Requesting Provider: [facility]  Primary OBGYN: [facility]  Reason for Consult: status epileptics at 32wks  History of Present Illness: Colleen Pearson is a 33 y.o. [id number] at 32/0 with above chief complaint. History signicant for seizure disorder, h/o c-section x 1, h/o polysubstance abuse  I was notified by the OB rapid response nurse that a patient from [facility] was coming and she was [redacted]wks pregnant and intubated due to recurrent seizures; I did and never did receive a call from anyone at [facility] regarding this patient. When we tried to call over to [facility] ED we were unable to get a hold of a provider for some sort of sign out.   I told the [facility] nurse that when this patient arrives she'll need to have the baby continuously monitored. I also called MFM Dr. Ileana and d/w him plan of care and scenarios for delivery. NICU Attending also notified that patient was on her way  When she arrived, I came to see her and she was intubated, O2 100%, not seizing. Per RN in trauma bay, patient being admitted to CCU and Neurology did not think she was currently seizing. OB RR checked her cervix and felt she was 1cm/50% effaced, soft/-3  ROS: A 12-point review of systems was performed and negative, except as stated in the above HPI.  OBGYN History: As per HPI. OB History  Gravida Para Term Preterm AB Living  3 1 0 1 1 1   SAB IAB Ectopic Multiple Live Births  0 1  0 1    # Outcome Date GA Lbr Len/2nd Weight Sex Type Anes PTL Lv  3 Current           2 Preterm 08/30/17 [redacted]w[redacted]d  2440 g F CS-LTranv EPI  LIV  1 IAB      TAB      Past Medical History: Past Medical History:  Diagnosis Date   Anemia    Asthma    Cocaine use complicating pregnancy in third trimester 08/30/2017   Endometriosis    Nexplanon  in place 11/04/2020   Ovarian cyst    Seizures (HCC)    Status post cesarean section 08/30/2017   Past Surgical History: Past Surgical History:   Procedure Laterality Date   CESAREAN SECTION N/A 08/30/2017   Procedure: CESAREAN SECTION;  Surgeon: Leonce Garnette BIRCH, MD;  Location: [facility] ORS;  Service: Obstetrics;  Laterality: N/A;   LASER ABLATION/CAUTERIZATION OF ENDOMETRIAL IMPLANTS     Family History:  Family History  Problem Relation Age of Onset   Down syndrome Sister    Social History:  Social History   Socioeconomic History   Marital status: Significant Other    Spouse name: Not on file   Number of children: 1   Years of education: Not on file   Highest education level: 9th grade  Occupational History   Not on file  Tobacco Use   Smoking status: Former    Current packs/day: 0.25    Types: Cigarettes, E-cigarettes   Smokeless tobacco: Never   Tobacco comments:    Cutting down, declines nicotine  replacement  Vaping Use   Vaping status: Some Days   Substances: Nicotine , Flavoring  Substance and Sexual Activity   Alcohol use: No   Drug use: Not Currently    Types: Marijuana, Cocaine    Comment: smoked marijuana july 2025 no longer cocaine   Sexual activity: Yes  Other Topics Concern   Not  on file  Social History Narrative   Not on file   Social Drivers of Health   Financial Resource Strain: Medium Risk (12/27/2023)   Overall Financial Resource Strain (CARDIA)    Difficulty of Paying Living Expenses: Somewhat hard  Food Insecurity: No Food Insecurity (12/27/2023)   Hunger Vital Sign    Worried About Running Out of Food in the Last Year: Never true    Ran Out of Food in the Last Year: Never true  Transportation Needs: No Transportation Needs (12/27/2023)   PRAPARE - Administrator, Civil Service (Medical): No    Lack of Transportation (Non-Medical): No  Physical Activity: Inactive (12/27/2023)   Exercise Vital Sign    Days of Exercise per Week: 0 days    Minutes of Exercise per Session: 0 min  Stress: No Stress Concern Present (12/27/2023)   [employer] of Occupational Health - Occupational  Stress Questionnaire    Feeling of Stress: Not at all  Social Connections: Unknown (12/27/2023)   Social Connection and Isolation Panel    Frequency of Communication with Friends and Family: Once a week    Frequency of Social Gatherings with Friends and Family: Once a week    Attends Religious Services: Never    Database Administrator or Organizations: No    Attends Banker Meetings: Never    Marital Status: Patient unable to answer  Intimate Partner Violence: Not At Risk (12/27/2023)   Humiliation, Afraid, Rape, and Kick questionnaire    Fear of Current or Ex-Partner: No    Emotionally Abused: No    Physically Abused: No    Sexually Abused: No   Allergy: Allergies  Allergen Reactions   Lamotrigine  Hives and Swelling    Pt is currently taking this medication   Penicillins Hives and Swelling    Tolerated Zosyn  02/24/23   Ultram [Tramadol] Hives and Swelling   Current Outpatient Medications: (Not in a hospital admission)  Hospital Medications: Current Facility-Administered Medications  Medication Dose Route Frequency Provider Last Rate Last Admin   betamethasone  acetate-betamethasone  sodium phosphate  (CELESTONE ) injection 12 mg  12 mg Intramuscular Once Erik Felts C, MD       fentaNYL  (SUBLIMAZE ) bolus via infusion 25-100 mcg  25-100 mcg Intravenous Q15 min PRN Smith, Joshua C, NP       fentaNYL  in NS (16mcg/ml) infusion-PREMIX  0-400 mcg/hr Intravenous Continuous Claudene Fonda BROCKS, NP 5 mL/hr at 04/23/24 0057 50 mcg/hr at 04/23/24 0057   lamoTRIgine  (LAMICTAL ) tablet 200 mg  200 mg Per Tube BID Michaela Aisha SQUIBB, MD       levETIRAcetam  (KEPPRA ) undiluted injection 750 mg  750 mg Intravenous Q6H Michaela Aisha SQUIBB, MD       midazolam  (VERSED ) 100 mg/100 mL (1 mg/mL) premix infusion  0.5-10 mg/hr Intravenous Continuous Maree Harder, MD 10 mL/hr at 04/23/24 0048 10 mg/hr at 04/23/24 0048   Current Outpatient Medications  Medication Sig Dispense  Refill   ferrous sulfate  325 (65 FE) MG EC tablet Take 325 mg by mouth 2 (two) times daily before a meal.     folic acid  (FOLVITE ) 1 MG tablet Take 1 tablet (1 mg total) by mouth daily. (Patient not taking: Reported on 04/11/2024) 30 tablet 10   lamoTRIgine  (LAMICTAL ) 200 MG tablet Take 1 tablet (200 mg total) by mouth 2 (two) times daily. 60 tablet 3   levETIRAcetam  (KEPPRA ) 1000 MG tablet Take 1 tablet (1,000 mg total) by mouth 2 (two) times daily.  60 tablet 0   ondansetron  (ZOFRAN -ODT) 4 MG disintegrating tablet Take 1 tablet (4 mg total) by mouth every 8 (eight) hours as needed. 30 tablet 1   Prenatal Vit-Fe Fumarate-FA (PRENATAL MULTIVITAMIN) TABS tablet Take 1 tablet by mouth daily at 12 noon. 90 tablet 3    Physical Exam:  Current Vital Signs 24h Vital Sign Ranges  T 98.2 F (36.8 C) Temp  Avg: 97.6 F (36.4 C)  Min: 95.8 F (35.4 C)  Max: 98.7 F (37.1 C)  BP (!) 146/71 BP  Min: 130/74  Max: 166/107  HR (!) 138 Pulse  Avg: 150.8  Min: 134  Max: 157  RR (!) 28 Resp  Avg: 31.2  Min: 26  Max: 36  SaO2 98 %   SpO2  Avg: 96.9 %  Min: 91 %  Max: 100 %       24 Hour I/O Current Shift I/O  Time Ins Outs No intake/output data recorded. No intake/output data recorded.   Patient Vitals for the past 24 hrs:  BP Temp Pulse Resp SpO2  04/23/24 0115 -- 98.2 F (36.8 C) (!) 138 (!) 28 98 %  04/23/24 0100 (!) 146/71 98.4 F (36.9 C) (!) 156 (!) 26 98 %  04/23/24 0045 135/78 98.1 F (36.7 C) (!) 153 (!) 36 96 %  04/23/24 0035 130/74 98 F (36.7 C) (!) 145 (!) 32 96 %  04/23/24 0030 (!) 142/77 98.7 F (37.1 C) (!) 153 (!) 30 91 %    FHR 150 baseline, no accel, no current decel, minimal variability Toco: q26m contractions  General appearance: intubated, NAD  Laboratory:  Recent Labs  Lab 04/22/24 2033 04/23/24 0103  WBC 20.9*  --   HGB 11.1* 10.2*  HCT 34.5* 30.0*  PLT 482*  --    Recent Labs  Lab 04/22/24 2033 04/23/24 0103  NA 135 137  K 3.2* 3.2*  CL 103  --   CO2  15*  --   BUN 6  --   CREATININE 0.96  --   CALCIUM  8.3*  --   PROT 6.7  --   BILITOT 0.5  --   ALKPHOS 167*  --   ALT 18  --   AST 41  --   GLUCOSE 273*  --    No results for input(s): APTT, INR, PTT in the last 168 hours.  Invalid input(s): DRHAPTT Recent Labs  Lab 04/22/24 2037  ABORH O POS   Imaging:  CT head negative.  pCXR concerning for b/l broncho PNA 10/21: efw 22%, 1454g, ac 41%, afi wnl  Assessment: patient stable  Plan: I talked to Dr. Michaela in the ED. He stated that she is not currently seizing and he believes this is due to subtherapeutic levels of her AEDs and they have room to increase her keppra  dosing if need be. He also recommends getting an MRI to check for PRES, which I'm in agreement with. I d/w him that delivery would be indicated for fetal indications and/or if seizures were recurrent and couldn't be treated with pregnancy safe medications. I also said that we recommend delivery if her labs became overtly abnormal and/or her OB ultrasound wasn't concerning. Medications reviewed and no issues  Currently, no indication for delivery.   Betamethasone  for fetal lung maturity ordered  Total time taking care of the patient was 90 minutes, with greater than 50% of the time spent in face to face interaction with the patient.  Colleen Izell Raddle MD Attending Center  for [employer] [facility]) GYN Consult Phone: [phone] (M-F, 0800-1700) & [phone] (Off hours, weekends, holidays)

## 2024-04-23 NOTE — Progress Notes (Signed)
 OB Note FHR 155 baseline, no accels, no current decels, minimal variability Toco: q71m Intubated, NAD O2 sats 100%  Pre-eclampsia labs, KB  See consult note for full details. Neurology paged  Bebe Furry, Mickey MD Attending Center for [facility] (Faculty Practice) 04/23/2024 Time: [phone]

## 2024-04-23 NOTE — Progress Notes (Signed)
 Transported pt from TRA A to 3M08 with bedside RN. Vitals are stable.

## 2024-04-23 NOTE — Progress Notes (Signed)
 Leads for overnight eeg were placed on patients head and then she was taken for emergency procedure. Recording will be started once patient returns.

## 2024-04-23 NOTE — Progress Notes (Signed)
 LTM EEG hooked up and running - no initial skin breakdown - push button tested - [facility] monitoring.

## 2024-04-23 NOTE — Transfer of Care (Signed)
 Immediate Anesthesia Transfer of Care Note  Patient: Colleen Pearson  Procedure(s) Performed: CESAREAN DELIVERY  Patient Location: ICU and SICU  Anesthesia Type:General  Level of Consciousness: sedated, unresponsive, and Patient remains intubated per anesthesia plan  Airway & Oxygen Therapy: Patient remains intubated per anesthesia plan and Patient placed on Ventilator (see vital sign flow sheet for setting)  Post-op Assessment: Report given to RN and Post -op Vital signs reviewed and stable  Post vital signs: Reviewed and stable  Last Vitals:  Vitals Value Taken Time  BP    Temp    Pulse 109 04/23/24 10:05  Resp 26 04/23/24 10:05  SpO2 100 % 04/23/24 10:05  Vitals shown include unfiled device data.  Last Pain:  Vitals:   04/23/24 0754  TempSrc: Oral  PainSc:          Complications: No notable events documented.

## 2024-04-23 NOTE — Discharge Summary (Shared)
 Postpartum Discharge Summary  Date of Service updated***     Patient Name: Colleen Pearson DOB: 04/26/1991 MRN: [id number]  Date of admission: 04/23/2024 Delivery date:04/23/2024 Delivering provider: IZELL HARARI Date of discharge: 04/23/2024  Admitting diagnosis: Status epilepticus (HCC) [G40.901] Previous cesarean delivery affecting pregnancy, antepartum [O34.219] Intrauterine pregnancy: [redacted]w[redacted]d     Secondary diagnosis:  Principal Problem:   Status epilepticus (HCC) Active Problems:   Seizures (HCC)   Supervision of high risk pregnancy, antepartum   Seizure disorder during pregnancy in third trimester [facility])   Previous cesarean delivery affecting pregnancy, antepartum   Post-operative state   Substance use disorder  Additional problems: ***    Discharge diagnosis: {DX.:23714}                                              Post partum procedures:{Postpartum procedures:23558} Augmentation: N/A Complications: None  Hospital course: Sceduled C/S   33 y.o. yo G3P0212 at [redacted]w[redacted]d was admitted to the hospital 04/23/2024 for acute on chronic seizure activity, was intubated at outside facility and transferred to [facility] ICU after she was deemed stable. Patient remained stable in ICU for several hours with reassuring fetal monitoring, then fetal heart tracing deteriorated, became category II with repeat variables. Given worsening fetal status, decision made to proceed with repeat c-section. She underwent urgent repeat c-section on 04/23/24  Delivery details are as follows:  Membrane Rupture Time/Date: 8:56 AM,04/23/2024  Delivery Method:C-Section, Low Transverse Operative Delivery:N/A Details of operation can be found in separate operative note.  Patient had a postpartum course complicated by***.  She is ambulating, tolerating a regular diet, passing flatus, and urinating well. Patient is discharged home in stable condition on  04/23/24        Newborn Data: Birth date:04/23/2024 Birth  time:8:56 AM Gender:Female Living status:Living Apgars:3 ,6  Weight:1770 g    Magnesium  Sulfate received: {Mag received:30440022} BMZ received: Yes 1 dose Rhophylac:N/A MMR:{MMR:30440033} T-DaP:{Tdap:23962} Flu: {Qol:76036} RSV Vaccine received: {RSV:31013} Transfusion:{Transfusion received:30440034}  Immunizations received: Immunization History  Administered Date(s) Administered   Tdap 04/11/2024    Physical exam  Vitals:   04/23/24 1330 04/23/24 1345 04/23/24 1400 04/23/24 1415  BP: 91/61 (!) 84/54 (!) 86/56 (!) 85/56  Pulse: 98 100 96 97  Resp: (!) 26 (!) 27 (!) 26 (!) 27  Temp:      TempSrc:      SpO2: 100% 100% 100% 100%  Weight:       General: {Exam; general:21111117} Lochia: {Desc; appropriate/inappropriate:30686::appropriate} Uterine Fundus: {Desc; firm/soft:30687} Incision: {Exam; incision:21111123} DVT Evaluation: {Exam; dvt:2111122} Labs: Lab Results  Component Value Date   WBC 8.4 04/23/2024   HGB 8.8 (L) 04/23/2024   HCT 26.0 (L) 04/23/2024   MCV 84.7 04/23/2024   PLT 208 04/23/2024      Latest Ref Rng & Units 04/23/2024   12:59 PM  CMP  Sodium 135 - 145 mmol/L 138   Potassium 3.5 - 5.1 mmol/L 4.2    Edinburgh Score:    01/11/2024   11:00 AM  Edinburgh Postnatal Depression Scale Screening Tool  I have been able to laugh and see the funny side of things. 0   I have looked forward with enjoyment to things. 1   I have blamed myself unnecessarily when things went wrong. 2   I have been anxious or worried for no good reason. 2   I  have felt scared or panicky for no good reason. 0   Things have been getting on top of me. 0   I have been so unhappy that I have had difficulty sleeping. 0   I have felt sad or miserable. 1   I have been so unhappy that I have been crying. 1   The thought of harming myself has occurred to me. 0   Edinburgh Postnatal Depression Scale Total 7     Data saved with a previous flowsheet row definition   No data  recorded  After visit meds:  Allergies as of 04/23/2024       Reactions   Lamotrigine  Hives, Swelling   Pt is currently taking this medication   Penicillins Hives, Swelling   Tolerated Zosyn  02/24/23   Ultram [tramadol] Hives, Swelling     Med Rec must be completed prior to using this [facility]***        Discharge home in stable condition Infant Feeding: {Baby feeding:23562} Infant Disposition:{CHL IP OB HOME WITH FNUYZM:76418} Discharge instruction: per After Visit Summary and Postpartum booklet. Activity: Advance as tolerated. Pelvic rest for 6 weeks.  Diet: {OB ipzu:78888878} Future Appointments: Future Appointments  Date Time Provider Department Center  04/25/2024 11:15 AM Jayne Harlene CROME, CNM [facility] None  05/09/2024  1:15 PM [facility] PROVIDER 1 [facility]  05/09/2024  1:30 PM [facility] US4 [facility]  05/16/2024  1:15 PM [facility] PROVIDER 1 [facility]  05/16/2024  1:30 PM [facility] US1 [facility]   Follow up Visit:   Please schedule this patient for a {Visit type:23955} postpartum visit in {Postpartum visit:23953} with the following provider: {Provider type:23954}. Additional Postpartum F/U:{PP Procedure:23957}  {Risk level:23960} pregnancy complicated by: {complication:23959} Delivery mode:  C-Section, Low Transverse Anticipated Birth Control:  {Birth Control:23956}   04/23/2024 Burnard CHRISTELLA Moats, MD

## 2024-04-23 NOTE — Progress Notes (Signed)
 ABG not crossing over at this time.   ABG drawn at 11:58:   7.29/39.7/76/19.4

## 2024-04-23 NOTE — ED Notes (Signed)
 Baseline QTc 379

## 2024-04-23 NOTE — Progress Notes (Signed)
 OB Update Note 04/23/2024 Time: 0228 Delayed entry due to epic downtime. I came to check on patient, again.   Fetal monitoring stable with 155 baseline, no accels, occasional subtle late decels (none currently), minimal variabiltiy with q37m UCs  OB limited u/s done and no concerning findings. Pt back from head MRI.  I asked OB RR to check pt's cervix again and unchanged at 1/50/high and no blood on glove. Foley now with blood tinged UOP. Labs and imaging results still pending.   No current indication for delivery.   Bebe Izell Raddle MD Attending Center for [facility] (Faculty Practice) GYN Consult Phone: [phone] (M-F, [phone]) & [phone] (Off hours, weekends, holidays)

## 2024-04-23 NOTE — Lactation Note (Signed)
 This note was copied from a baby's chart. Lactation Consultation Note  Patient Name: Colleen Pearson Unijb'd Date: 04/23/2024 Age:33 hours   Lactation services complete as Mom had chosen formula to feed her baby.    Consult Status Consult Status: Complete    Claudene Aleck BRAVO 04/23/2024, 12:05 PM

## 2024-04-23 NOTE — Progress Notes (Signed)
 Dr. Nicholaus notified of pt's bleeding. She checked her vaginally and removed a few small clots. She says she will be back to reassess the pt's bleeding at 1600. Fundus is 4 FB below and firm, midline.Peri care done.

## 2024-04-23 NOTE — Progress Notes (Signed)
 Patient transported to OR and back on ventilator without complications. RN and CRNA at bedside.

## 2024-04-24 ENCOUNTER — Inpatient Hospital Stay (HOSPITAL_COMMUNITY)

## 2024-04-24 ENCOUNTER — Encounter (HOSPITAL_COMMUNITY): Payer: Self-pay | Admitting: Obstetrics and Gynecology

## 2024-04-24 DIAGNOSIS — O99323 Drug use complicating pregnancy, third trimester: Secondary | ICD-10-CM

## 2024-04-24 DIAGNOSIS — R578 Other shock: Secondary | ICD-10-CM

## 2024-04-24 DIAGNOSIS — G929 Unspecified toxic encephalopathy: Secondary | ICD-10-CM | POA: Diagnosis not present

## 2024-04-24 DIAGNOSIS — J189 Pneumonia, unspecified organism: Secondary | ICD-10-CM

## 2024-04-24 DIAGNOSIS — G40909 Epilepsy, unspecified, not intractable, without status epilepticus: Secondary | ICD-10-CM

## 2024-04-24 DIAGNOSIS — F1290 Cannabis use, unspecified, uncomplicated: Secondary | ICD-10-CM

## 2024-04-24 DIAGNOSIS — G40901 Epilepsy, unspecified, not intractable, with status epilepticus: Secondary | ICD-10-CM | POA: Diagnosis not present

## 2024-04-24 DIAGNOSIS — I959 Hypotension, unspecified: Secondary | ICD-10-CM

## 2024-04-24 DIAGNOSIS — Z3A33 33 weeks gestation of pregnancy: Secondary | ICD-10-CM

## 2024-04-24 LAB — CBC WITH DIFFERENTIAL/PLATELET
Basophils Absolute: 0 K/uL (ref 0.0–0.1)
Basophils Relative: 0 %
Eosinophils Absolute: 0 K/uL (ref 0.0–0.5)
Eosinophils Relative: 0 %
HCT: 31.9 % — ABNORMAL LOW (ref 36.0–46.0)
Hemoglobin: 10.7 g/dL — ABNORMAL LOW (ref 12.0–15.0)
Lymphocytes Relative: 4 %
Lymphs Abs: 1.1 K/uL (ref 0.7–4.0)
MCH: 29.6 pg (ref 26.0–34.0)
MCHC: 33.5 g/dL (ref 30.0–36.0)
MCV: 88.1 fL (ref 80.0–100.0)
Monocytes Absolute: 1.4 K/uL — ABNORMAL HIGH (ref 0.1–1.0)
Monocytes Relative: 5 %
Neutro Abs: 25.8 K/uL — ABNORMAL HIGH (ref 1.7–7.7)
Neutrophils Relative %: 91 %
Platelets: 282 K/uL (ref 150–400)
RBC: 3.62 MIL/uL — ABNORMAL LOW (ref 3.87–5.11)
RDW: 16.5 % — ABNORMAL HIGH (ref 11.5–15.5)
WBC: 28.3 K/uL — ABNORMAL HIGH (ref 4.0–10.5)
nRBC: 0 % (ref 0.0–0.2)

## 2024-04-24 LAB — ECHOCARDIOGRAM COMPLETE
AR max vel: 2.53 cm2
AV Area VTI: 2.51 cm2
AV Area mean vel: 2.6 cm2
AV Mean grad: 3 mmHg
AV Peak grad: 6.8 mmHg
Ao pk vel: 1.3 m/s
Area-P 1/2: 3.24 cm2
Calc EF: 44.1 %
MV VTI: 1.9 cm2
S' Lateral: 3 cm
Single Plane A2C EF: 45.6 %
Single Plane A4C EF: 42.4 %
Weight: 2391.55 [oz_av]

## 2024-04-24 LAB — BLOOD GAS, ARTERIAL
Acid-base deficit: 7.3 mmol/L — ABNORMAL HIGH (ref 0.0–2.0)
Bicarbonate: 20.1 mmol/L (ref 20.0–28.0)
O2 Saturation: 99.7 %
Patient temperature: 37
pCO2 arterial: 47 mmHg (ref 32–48)
pH, Arterial: 7.24 — ABNORMAL LOW (ref 7.35–7.45)
pO2, Arterial: 158 mmHg — ABNORMAL HIGH (ref 83–108)

## 2024-04-24 LAB — TYPE AND SCREEN
ABO/RH(D): O POS
Antibody Screen: NEGATIVE
Unit division: 0

## 2024-04-24 LAB — BASIC METABOLIC PANEL WITH GFR
Anion gap: 9 (ref 5–15)
BUN: 8 mg/dL (ref 6–20)
CO2: 21 mmol/L — ABNORMAL LOW (ref 22–32)
Calcium: 8.2 mg/dL — ABNORMAL LOW (ref 8.9–10.3)
Chloride: 115 mmol/L — ABNORMAL HIGH (ref 98–111)
Creatinine, Ser: 1.07 mg/dL — ABNORMAL HIGH (ref 0.44–1.00)
GFR, Estimated: >60 mL/min (ref >60)
Glucose, Bld: 138 mg/dL — ABNORMAL HIGH (ref 70–99)
Potassium: 5.4 mmol/L — ABNORMAL HIGH (ref 3.5–5.1)
Sodium: 145 mmol/L (ref 135–145)

## 2024-04-24 LAB — BPAM RBC
Blood Product Expiration Date: 202511232359
ISSUE DATE / TIME: 202511021553
Unit Type and Rh: 5100

## 2024-04-24 LAB — TRIGLYCERIDES: Triglycerides: 107 mg/dL (ref <150)

## 2024-04-24 LAB — GLUCOSE, CAPILLARY
Glucose-Capillary: 123 mg/dL — ABNORMAL HIGH (ref 70–99)
Glucose-Capillary: 110 mg/dL — ABNORMAL HIGH (ref 70–99)
Glucose-Capillary: 103 mg/dL — ABNORMAL HIGH (ref 70–99)
Glucose-Capillary: 102 mg/dL — ABNORMAL HIGH (ref 70–99)
Glucose-Capillary: 103 mg/dL — ABNORMAL HIGH (ref 70–99)
Glucose-Capillary: 114 mg/dL — ABNORMAL HIGH (ref 70–99)

## 2024-04-24 LAB — MAGNESIUM: Magnesium: 2.6 mg/dL — ABNORMAL HIGH (ref 1.7–2.4)

## 2024-04-24 LAB — RPR: RPR Ser Ql: NONREACTIVE

## 2024-04-24 MED ORDER — SODIUM ZIRCONIUM CYCLOSILICATE 10 G PO PACK
10.0000 g | PACK | Freq: Once | ORAL | Status: AC
  Administered 2024-04-24: 10 g
  Filled 2024-04-24: qty 1

## 2024-04-24 MED ORDER — NOREPINEPHRINE 16 MG/250ML-% IV SOLN
0.0000 ug/min | INTRAVENOUS | Status: DC
  Administered 2024-04-24: 11 ug/min via INTRAVENOUS
  Filled 2024-04-24: qty 250

## 2024-04-24 MED ORDER — DEXMEDETOMIDINE HCL IN NACL 400 MCG/100ML IV SOLN
0.0000 ug/kg/h | INTRAVENOUS | Status: DC
  Administered 2024-04-24: 0.4 ug/kg/h via INTRAVENOUS
  Administered 2024-04-25: 1.2 ug/kg/h via INTRAVENOUS
  Administered 2024-04-25: 1 ug/kg/h via INTRAVENOUS
  Administered 2024-04-25: 1.2 ug/kg/h via INTRAVENOUS
  Administered 2024-04-26: 0.9 ug/kg/h via INTRAVENOUS
  Administered 2024-04-26: 0.5 ug/kg/h via INTRAVENOUS
  Administered 2024-04-26: 1 ug/kg/h via INTRAVENOUS
  Administered 2024-04-27: 0.3 ug/kg/h via INTRAVENOUS
  Filled 2024-04-24 (×7): qty 100

## 2024-04-24 MED ORDER — PERFLUTREN LIPID MICROSPHERE
1.0000 mL | INTRAVENOUS | Status: AC | PRN
  Administered 2024-04-24: 4 mL via INTRAVENOUS

## 2024-04-24 MED ORDER — CHLORHEXIDINE GLUCONATE CLOTH 2 % EX PADS
6.0000 | MEDICATED_PAD | Freq: Every day | CUTANEOUS | Status: DC
  Administered 2024-04-25 – 2024-05-05 (×12): 6 via TOPICAL

## 2024-04-24 NOTE — Progress Notes (Signed)
 NEUROLOGY CONSULT FOLLOW UP NOTE   Date of service: April 24, 2024 Patient Name: Colleen Pearson MRN:  [id number] DOB:  11-Jan-1991  Interval Hx/subjective  Intubated and sedated on fentanyl  at a rate of 200 and propofol  at a rate of 20. Has become agitated with attempts to wean.   Vitals   Vitals:   04/24/24 1745 04/24/24 1800 04/24/24 1815 04/24/24 1830  BP: (!) 96/58 (!) 95/58 (!) 95/58 (!) 94/59  Pulse: 65 64 62 64  Resp: (!) 24 (!) 23 (!) 28 (!) 26  Temp:      TempSrc:      SpO2: 99% 99% 99% 99%  Weight:         Body mass index is 28.26 kg/m.  Physical Exam   General: Sedated, intubated HEENT: Normocephalic. No nuchal rigidity.  CVS: Regular rate rhythm Respiratory: Vented  Neurological exam Ment: Sedated with propofol  and fentanyl . No withdrawal to noxious. Eyes are slightly open by about 1-2 mm, but no further eye opening with stimulation.   CN: Pinpoint pupils. No blink to threat. Absent oculocephalic reflex. No corneal response. Face flaccidly symmetric.  Motor/Sensory: No spontaneous movement or withdrawal to noxious. Tone decreased x 4 Reflexes: 2+ bilateral brachioradialis. 3+ bilateral patellars. 2+ right achilles, 4+ left achilles with clonus.    Medications  Current Facility-Administered Medications:    Ampicillin-Sulbactam (UNASYN) 3 g in sodium chloride  0.9 % 100 mL IVPB, 3 g, Intravenous, Q6H, Hunsucker, Donnice SAUNDERS, MD, Last Rate: 200 mL/hr at 04/24/24 1733, 3 g at 04/24/24 1733   Chlorhexidine  Gluconate Cloth 2 % PADS 6 each, 6 each, Topical, Daily, Harris, Whitney D, NP   coconut oil, 1 Application, Topical, PRN, Nicholaus Burnard HERO, MD   dexamethasone  (DECADRON ) injection 20 mg, 20 mg, Intravenous, Q24H, 20 mg at 04/24/24 1217 **FOLLOWED BY** [START ON 04/28/2024] dexamethasone  (DECADRON ) injection 10 mg, 10 mg, Intravenous, Q24H, Bowser, Grace E, NP   dexmedetomidine  (PRECEDEX ) 400 MCG/100ML (4 mcg/mL) infusion, 0-1.2 mcg/kg/hr, Intravenous, Titrated,  Harris, Whitney D, NP, Last Rate: 11.87 mL/hr at 04/24/24 1700, 0.7 mcg/kg/hr at 04/24/24 1700   famotidine  (PEPCID ) tablet 20 mg, 20 mg, Per Tube, BID, Maree Harder, MD, 20 mg at 04/24/24 0936   fentaNYL  (SUBLIMAZE ) bolus via infusion 25-100 mcg, 25-100 mcg, Intravenous, Q15 min PRN, Claudene Chew C, NP, 100 mcg at 04/24/24 1637   fentaNYL  in NS (38mcg/ml) infusion-PREMIX, 0-400 mcg/hr, Intravenous, Continuous, Claudene Chew C, NP, Last Rate: 20 mL/hr at 04/24/24 1700, 200 mcg/hr at 04/24/24 1700   heparin  injection 5,000 Units, 5,000 Units, Subcutaneous, Q8H, Maree Harder, MD, 5,000 Units at 04/24/24 1434   insulin  aspart (novoLOG ) injection 0-15 Units, 0-15 Units, Subcutaneous, Q4H, Maree Harder, MD, 2 Units at 04/24/24 0411   lamoTRIgine  (LAMICTAL ) tablet 200 mg, 200 mg, Per Tube, BID, Michaela Aisha SQUIBB, MD, 200 mg at 04/24/24 0936   levETIRAcetam  (KEPPRA ) undiluted injection 1,500 mg, 1,500 mg, Intravenous, Q12H, Arora, Ashish, MD, 1,500 mg at 04/24/24 0934   norepinephrine  (LEVOPHED ) 16 mg in 250mL (0.064 mg/mL) premix infusion, 0-40 mcg/min, Intravenous, Titrated, Izell Harari, MD, Stopped at 04/24/24 1350   ondansetron  (ZOFRAN ) injection 4 mg, 4 mg, Intravenous, Q6H PRN, Maree Harder, MD   Oral care mouth rinse, 15 mL, Mouth Rinse, Q2H, Maree Harder, MD, 15 mL at 04/24/24 1814   Oral care mouth rinse, 15 mL, Mouth Rinse, PRN, Maree Harder, MD, 15 mL at 04/23/24 0546   propofol  (DIPRIVAN ) 1000 MG/100ML infusion, 0-80 mcg/kg/min, Intravenous, Titrated, Mallory Breed,  Kieth HERO, MD, Last Rate: 8.5 mL/hr at 04/24/24 1838, 20 mcg/kg/min at 04/24/24 1838  Labs and Diagnostic Imaging   CBC:  Recent Labs  Lab 04/23/24 0850 04/23/24 0928 04/23/24 1055 04/23/24 1120 04/23/24 2136 04/24/24 0431  WBC 8.4  --   --   --   --  28.3*  NEUTROABS 7.6  --   --   --   --  25.8*  HGB 9.8*   < >  --    < > 11.1* 10.7*  HCT 28.3*   < >  --    < > 33.3* 31.9*  MCV 84.7  --   --   --    --  88.1  PLT 205  --  208  --   --  282   < > = values in this interval not displayed.    Basic Metabolic Panel:  Lab Results  Component Value Date   NA 145 04/24/2024   K 5.4 (H) 04/24/2024   CO2 21 (L) 04/24/2024   GLUCOSE 138 (H) 04/24/2024   BUN 8 04/24/2024   CREATININE 1.07 (H) 04/24/2024   CALCIUM  8.2 (L) 04/24/2024   GFRNONAA >60 04/24/2024   GFRAA >60 07/21/2018   Lipid Panel:  Lab Results  Component Value Date   LDLCALC 100 (H) 04/22/2024   HgbA1c:  Lab Results  Component Value Date   HGBA1C 3.8 (L) 04/23/2024   Urine Drug Screen:     Component Value Date/Time   LABOPIA NONE DETECTED 04/23/2024 0107   COCAINSCRNUR POSITIVE (A) 04/23/2024 0107   COCAINSCRNUR POSITIVE (A) 08/21/2021 0220   LABBENZ POSITIVE (A) 04/23/2024 0107   AMPHETMU NONE DETECTED 04/23/2024 0107   THCU POSITIVE (A) 04/23/2024 0107   LABBARB NONE DETECTED 04/23/2024 0107    Alcohol Level     Component Value Date/Time   ETH <10 02/22/2023 0007   INR  Lab Results  Component Value Date   INR 1.1 04/23/2024   APTT  Lab Results  Component Value Date   APTT 26 04/23/2024   AED levels:  Lab Results  Component Value Date   LAMOTRIGINE  2.4 04/11/2024   LEVETIRACETA 25.0 04/11/2024    CT Head without contrast(Personally reviewed): Negative   MRI Brain(Personally reviewed): No acute findings   rEEG:  General irregular slow activity.  No seizures   Assessment  Colleen Pearson is a 33 y.o. female with a PMHxy of seizure disorder and polysubstance abuse who presented at [redacted] weeks pregnant with breakthrough seizure. UDS cocaine and THC positive. She was taken for emergent C-section due to fetal heart decelerations. Remains intubated and with a limited exam on sedation - Keppra  dose increased to 750 mg every 6 hours, lamotrigine  200 twice daily. - MRI of the brain does not show any evidence of posterior reversible encephalopathy syndrome or any other acute abnormalities. - LTM  EEG report for this morning: Continuous slow, generalized. Burst attenuation, generalized. This study is suggestive of diffuse cerebral dysfunction (encephalopathy) likely related to sedation. No seizures or epileptiform discharges were seen throughout the recording. - Impression: Breakthrough seizure, likely in the setting of illicit substance use.  Less likely to be eclampsia.    Recommendations  She has now had a C-section and delivered a baby. Keppra  dose can be made twice daily although few days postpartum, the volume of distribution and metabolism still might make the need of Keppra  dose be high but she is not seizing on her EEG. I would recommend keeping her on  Keppra  twice daily dosing at 1500 mg BID for now and after she is extubated, reduce the dose to her home dose of 1000 twice daily, while she is on LTM. Continue her lamotrigine  at 200 twice daily. Maintain seizure precautions As far as her sedation goes, I would like to reduce her fentanyl  as tolerated. Continue propofol  at the current rate of 20.   If she needs sedation for clinical seizure activity or vent management, there is room to go up on propofol . She remains on LTM-we will watch her on LTM, till there is a good clinical exam and medication dosages have been adjusted - I would expect in the next 1 to 2 days. Neurology will continue to follow ______________________________________________________________________  CRITICAL CARE Performed by: MERRIANNE, Jamie Belger  Total critical care time: 35 minutes  Critical care time was exclusive of separately billable procedures and treating other patients.  Critical care was necessary to treat or prevent imminent or life-threatening deterioration.  Critical care was time spent personally by me on the following activities: development of treatment plan with patient and/or surrogate as well as nursing, discussions with consultants, evaluation of patient's response to treatment, examination of  patient, obtaining history from patient or surrogate, ordering and performing treatments and interventions, ordering and review of laboratory studies, ordering and review of radiographic studies, pulse oximetry and re-evaluation of patient's condition.   SignedMERRIANNE Coline Calkin, MD [facility]

## 2024-04-24 NOTE — Progress Notes (Signed)
 PCCM progress note  Throughout the morning we have attempted to lighten sedation to evaluate ability to trial SBT unfortunately patient's mentation continues to preclude extubation.  Will attempt to exchange propofol  for Precedex  but currently agitation precludes this  Gilad Dugger D. Harris, NP-C [facility] Personal contact information can be found on Amion  If no contact or response made please call [phone] 04/24/2024, 2:43 PM

## 2024-04-24 NOTE — Progress Notes (Signed)
 LTM maint complete - no skin breakdown under:  Fp1,Fp2

## 2024-04-24 NOTE — Progress Notes (Signed)
 NAME:  Colleen Pearson, MRN:  [id number], DOB:  May 22, 1991, LOS: 1 ADMISSION DATE:  04/23/2024, CONSULTATION DATE:  04/22/2024 REFERRING MD:  Oneil Budge, MD, CHIEF COMPLAINT:  Status Epilepticus   History of Present Illness:  33 y/o female with h/o Epilepsy and Polysubstance d/o who is a [phone] and is currently [redacted] weeks pregnant who was on the cough and then have several seizures.  EMS gave 4mg  IV Magnesium  and 5mg  IV Midazolam .  She was lethargic with secretions not able to protect her airways and she was intubated in the ED.  She has been started on Versed  drip.  She was loaded with Keppra .  There has been reports of non-compliance with her seizure meds-Keppra  and Lamictal .  She also had low levels as outpatient.  She has seizures in September as well, similar situation and apparently has a seizure two weeks prior to that ED visit.  Pertinent  Medical History  Epilepsy and Polysubstance d/o, CF carrier  Significant Hospital Events: Including procedures, antibiotic start and stop dates in addition to other pertinent events   11/1: transfer to [facility] from [facility] 11/2 admitted to ICU intubated and sedated signs of fetal distress resulting in emergent C-section 11/3 remains intubated deeply sedated on LTM  Interim History / Subjective:  Deeply sedated on ventilator  Objective    Blood pressure (!) 104/57, pulse 74, temperature 98.7 F (37.1 C), temperature source Oral, resp. rate 17, weight 67.8 kg, SpO2 100%, unknown if currently breastfeeding.    Vent Mode: PRVC FiO2 (%):  [50 %] 50 % Set Rate:  [26 bmp-28 bmp] 28 bmp Vt Set:  [320 mL-450 mL] 350 mL PEEP:  [8 cmH20-12 cmH20] 12 cmH20 Plateau Pressure:  [16 cmH20-25 cmH20] 22 cmH20   Intake/Output Summary (Last 24 hours) at 04/24/2024 0817 Last data filed at 04/24/2024 0700 Gross per 24 hour  Intake 4572.63 ml  Output 5142 ml  Net -569.37 ml   Filed Weights   04/23/24 0500 04/24/24 0500  Weight: 70.8 kg 67.8 kg     Examination: General: Acute ill-appearing adult female lying in bed on mechanical ventilation in no acute distress HEENT: ETT, MM pink/moist, PERRL,  Neuro: Deeply sedated on ventilator CV: s1s2 regular rate and rhythm, no murmur, rubs, or gallops,  PULM: Clear to auscultation bilaterally, no increased work of breathing, no added breath sounds GI: soft, bowel sounds active in all 4 quadrants, non-tender, non-distended Extremities: warm/dry, no edema  Skin: no rashes or lesions  Resolved problem list   Assessment and Plan   Sz disorder w status epilepticus, not compliant with medications at baseline Substance use disorder  -MRI w/o acute abnormality  -CT c spine without abnormality and there is no reported fall/trauma in review of [facility] notes and our ntoes  P: Management per neurology Continue propofol  and fentanyl  at current doses, begin weaning once cleared per neurology LTM per neurology Seizure precautions AEDs per neurology  S/p emergent C-section at 32 weeks  - Fetal distress noticed overnight of admission resulting in decision for emergent C-section P: Obstetric care per OB Monitor for uterine bleeding  Acute hypoxic and hypercarbic respiratory failure Aspiration PNA  P: Hold SBT while heavy sedation required Continue ventilator support with lung protective strategies  Wean PEEP and FiO2 for sats greater than 90%. Head of bed elevated 30 degrees. Plateau pressures less than 30 cm H20.  Follow intermittent chest x-ray and ABG.   SAT/SBT as tolerated, mentation preclude extubation  Ensure adequate pulmonary hygiene  Follow  cultures  VAP bundle in place  PAD protocol Continue empiric Unasyn  At risk for AKI - Creatinine slightly increased a.m. 11/2 but GFR remains greater than 60 Hyperkalemia P: Follow renal function  Monitor urine output Trend Bmet Avoid nephrotoxins, ensure adequate renal perfusion  IV hydration One-time dose of IV  Lokelma  Hypotension -medication related v undifferentiated shock state versus heavy sedation requirement P: Sedation likely contributing to hypotension Continue empiric antibiotics  Metabolic acidosis -likely 2/2 sz P: Supportive care/management as above  Hypokalemia now with hyperkalemia P: Trend CBC  Signature  CRITICAL CARE Performed by: Conlee Sliter D. Pearson   Total critical care time: 38 minutes  Critical care time was exclusive of separately billable procedures and treating other patients.  Critical care was necessary to treat or prevent imminent or life-threatening deterioration.  Critical care was time spent personally by me on the following activities: development of treatment plan with patient and/or surrogate as well as nursing, discussions with consultants, evaluation of patient's response to treatment, examination of patient, obtaining history from patient or surrogate, ordering and performing treatments and interventions, ordering and review of laboratory studies, ordering and review of radiographic studies, pulse oximetry and re-evaluation of patient's condition.  Colleen Bobeck D. Harris, NP-C [facility] Personal contact information can be found on Amion  If no contact or response made please call [phone] 04/24/2024, 9:10 AM

## 2024-04-24 NOTE — Progress Notes (Signed)
 Subjective: Postpartum Day 1: Cesarean Delivery Patient reports intubated and sedated.    Objective: Vital signs in last 24 hours: Temp:  [97.5 F (36.4 C)-98.7 F (37.1 C)] 98.5 F (36.9 C) (11/03 1150) Pulse Rate:  [64-132] 71 (11/03 1515) Resp:  [0-32] 25 (11/03 1515) BP: (65-135)/(39-97) 97/59 (11/03 1515) SpO2:  [95 %-100 %] 98 % (11/03 1530) Arterial Line BP: (69-98)/(51-90) 84/77 (11/02 2230) FiO2 (%):  [40 %-50 %] 40 % (11/03 1530) Weight:  [67.8 kg] 67.8 kg (11/03 0500)  Physical Exam:  General: intubated and sedated Lochia: appropriate Uterine Fundus: firm Incision: healing well, no significant drainage, no dehiscence, no significant erythema DVT Evaluation: No evidence of DVT seen on physical exam.  Recent Labs    04/23/24 2136 04/24/24 0431  HGB 11.1* 10.7*  HCT 33.3* 31.9*    Assessment/Plan: Status post Cesarean section. From post op standpoint doing fine, certainly biggest issue is neurologic Will continue to follow with critical care neruo temas  No new recommendations.  Vonn VEAR Inch, MD 04/24/2024, 3:52 PM

## 2024-04-24 NOTE — Procedures (Signed)
 Patient Name: YANETTE TRIPOLI  MRN: [id number]  Epilepsy Attending: Arlin MALVA Krebs  Referring Physician/Provider: Michaela Aisha SQUIBB, MD  Duration: 04/23/2024 1030 to 04/24/2024 1030  Patient history: 33 y.o. female past history of seizure, polysubstance abuse who is [redacted] weeks pregnant, brought in for breakthrough seizure. EEG to evaluate for seizure  Level of alertness: comatose/ lethargic   AEDs during EEG study: LEV, LTG, Propofol   Technical aspects: This EEG study was done with scalp electrodes positioned according to the 10-20 International system of electrode placement. Electrical activity was reviewed with band pass filter of 1-70Hz , sensitivity of 7 uV/mm, display speed of 53mm/sec with a 60Hz  notched filter applied as appropriate. EEG data were recorded continuously and digitally stored.  Video monitoring was available and reviewed as appropriate.  Description: EEG initially showed continuous generalized 3 to 6 Hz theta-delta slowing admixed with 15 to 18 Hz beta activity distributed symmetrically and diffusely. Gradually, EEG evolved into burst attenuation pattern with bursts of 3 to 6 Hz theta-delta slowing admixed with 12 to 13 Hz beta activity lasting 4 to 5 seconds alternating with 5 to 7 seconds of generalized EEG attenuation. Hyperventilation and photic stimulation were not performed.     ABNORMALITY - Continuous slow, generalized - Burst attenuation, generalized  IMPRESSION: This study is suggestive of diffuse cerebral dysfunction (encephalopathy) likely related to sedation. No seizures or epileptiform discharges were seen throughout the recording.  Amos Gaber O Monicka Cyran

## 2024-04-24 NOTE — TOC CM/SW Note (Addendum)
 Transition of Care [facility]) - Inpatient Brief Assessment   Patient Details  Name: Colleen Pearson MRN: [id number] Date of Birth: 11-13-1990  Transition of Care [facility]) CM/SW Contact:    Tom-Johnson, Harvest Muskrat, RN Phone Number: 04/24/2024, 4:34 PM   Clinical Narrative:  Patient presented to [facility] ED with breakthrough Seizures. Neurology following.  Patient is currently [redacted] weeks pregnant. Has hx of Seizures and on Lamotrigine . Patient was intubated at [facility] for Aspiration precaution and Airway protection and transferred to [facility] for further management. UDS showed Cocaine positive.  OBGYN consulted, patient underwent emergent Cesarean Section for recurrent Seizures and Variables. Patient currently intubated and sedated I ICU, baby is in NICU.    Patient not Medically ready for discharge.  CM will continue to follow as patient progresses with care towards discharge.      Transition of Care Asessment:

## 2024-04-24 NOTE — Plan of Care (Signed)
   Problem: Coping: Goal: Ability to adjust to condition or change in health will improve Outcome: Progressing   Problem: Fluid Volume: Goal: Ability to maintain a balanced intake and output will improve Outcome: Progressing   Problem: Metabolic: Goal: Ability to maintain appropriate glucose levels will improve Outcome: Progressing

## 2024-04-25 ENCOUNTER — Inpatient Hospital Stay (HOSPITAL_COMMUNITY)

## 2024-04-25 ENCOUNTER — Encounter: Admitting: Certified Nurse Midwife

## 2024-04-25 DIAGNOSIS — J9601 Acute respiratory failure with hypoxia: Secondary | ICD-10-CM | POA: Diagnosis not present

## 2024-04-25 DIAGNOSIS — J690 Pneumonitis due to inhalation of food and vomit: Secondary | ICD-10-CM

## 2024-04-25 DIAGNOSIS — D72829 Elevated white blood cell count, unspecified: Secondary | ICD-10-CM

## 2024-04-25 DIAGNOSIS — G40901 Epilepsy, unspecified, not intractable, with status epilepticus: Secondary | ICD-10-CM | POA: Diagnosis not present

## 2024-04-25 DIAGNOSIS — G40909 Epilepsy, unspecified, not intractable, without status epilepticus: Secondary | ICD-10-CM | POA: Diagnosis not present

## 2024-04-25 LAB — CBC
HCT: 28.2 % — ABNORMAL LOW (ref 36.0–46.0)
Hemoglobin: 9.2 g/dL — ABNORMAL LOW (ref 12.0–15.0)
MCH: 29.4 pg (ref 26.0–34.0)
MCHC: 32.6 g/dL (ref 30.0–36.0)
MCV: 90.1 fL (ref 80.0–100.0)
Platelets: 264 K/uL (ref 150–400)
RBC: 3.13 MIL/uL — ABNORMAL LOW (ref 3.87–5.11)
RDW: 17.2 % — ABNORMAL HIGH (ref 11.5–15.5)
WBC: 26.7 K/uL — ABNORMAL HIGH (ref 4.0–10.5)
nRBC: 0.1 % (ref 0.0–0.2)

## 2024-04-25 LAB — BASIC METABOLIC PANEL WITH GFR
Anion gap: 7 (ref 5–15)
BUN: 19 mg/dL (ref 6–20)
CO2: 23 mmol/L (ref 22–32)
Calcium: 8.3 mg/dL — ABNORMAL LOW (ref 8.9–10.3)
Chloride: 115 mmol/L — ABNORMAL HIGH (ref 98–111)
Creatinine, Ser: 0.99 mg/dL (ref 0.44–1.00)
GFR, Estimated: >60 mL/min (ref >60)
Glucose, Bld: 105 mg/dL — ABNORMAL HIGH (ref 70–99)
Potassium: 4.8 mmol/L (ref 3.5–5.1)
Sodium: 145 mmol/L (ref 135–145)

## 2024-04-25 LAB — GLUCOSE, CAPILLARY
Glucose-Capillary: 101 mg/dL — ABNORMAL HIGH (ref 70–99)
Glucose-Capillary: 102 mg/dL — ABNORMAL HIGH (ref 70–99)
Glucose-Capillary: 104 mg/dL — ABNORMAL HIGH (ref 70–99)
Glucose-Capillary: 105 mg/dL — ABNORMAL HIGH (ref 70–99)
Glucose-Capillary: 124 mg/dL — ABNORMAL HIGH (ref 70–99)

## 2024-04-25 LAB — SURGICAL PATHOLOGY

## 2024-04-25 MED ORDER — MIDAZOLAM HCL 2 MG/2ML IJ SOLN
INTRAMUSCULAR | Status: AC
  Administered 2024-04-25: 2 mg via INTRAVENOUS
  Filled 2024-04-25: qty 2

## 2024-04-25 MED ORDER — MIDAZOLAM HCL 2 MG/2ML IJ SOLN
INTRAMUSCULAR | Status: AC
  Filled 2024-04-25: qty 2

## 2024-04-25 MED ORDER — MIDAZOLAM HCL (PF) 2 MG/2ML IJ SOLN
2.0000 mg | INTRAMUSCULAR | Status: DC | PRN
Start: 2024-04-25 — End: 2024-04-29
  Administered 2024-04-25 – 2024-04-29 (×10): 2 mg via INTRAVENOUS
  Filled 2024-04-25 (×9): qty 2

## 2024-04-25 MED ORDER — FUROSEMIDE 10 MG/ML IJ SOLN: 120.0000 mg | Freq: Once | INTRAVENOUS | Status: DC

## 2024-04-25 MED ORDER — MIDAZOLAM HCL (PF) 2 MG/2ML IJ SOLN: 2.0000 mg | Freq: Once | INTRAMUSCULAR | Status: AC

## 2024-04-25 NOTE — Progress Notes (Signed)
 eLink Physician-Brief Progress Note Patient Name: Colleen Pearson DOB: 11-Dec-1990 MRN: [id number]   Date of Service  04/25/2024  HPI/Events of Note  33 year old female with a history of seizure disorder and polysubstance abuse who presented at [redacted] weeks pregnant with breakthrough seizure and status epilepticus on multiple AEDs and deep sedation.    Called emergently to bedside due to severe agitation requiring multiple staff to hold the patient down again.  Today, the patient was transition from propofol  and fentanyl  to Precedex  and fentanyl  and required a push of Versed  during a similar episode during the daytime.  Extubation trial was foregone due to swings between severe agitation and somnolence.  eICU Interventions  Repeat a one-time dose of Versed .  Add the Versed  as a as needed if the patient becomes severely agitated again.  Favor steady-state sedation overnight with low-dose propofol  in conjunction with Precedex  and fentanyl  overnight rather than intermittent Versed .  Anticipate difficult arousal again in the morning during SAT.     Intervention Category Intermediate Interventions: Change in mental status - evaluation and management  Bianey Tesoro 04/25/2024, 7:21 PM

## 2024-04-25 NOTE — Progress Notes (Signed)
 NEUROLOGY CONSULT FOLLOW UP NOTE   Date of service: April 25, 2024 Patient Name: Colleen Pearson MRN:  [id number] DOB:  1990-07-05  Interval Hx/subjective  Intubated and sedated on fentanyl  at a rate of 25, propofol  at a rate of 15 and Precedex  at a rate of 1.2.   Vitals   Vitals:   04/25/24 0748 04/25/24 0800 04/25/24 0829 04/25/24 0900  BP:  (!) 103/57 120/68 (!) 106/57  Pulse:  67 67 65  Resp:  (!) 28 14 10   Temp: 98.7 F (37.1 C)     TempSrc: Axillary     SpO2:  98% 100% 100%  Weight:         Body mass index is 26.22 kg/m.  Physical Exam   General: Sedated, intubated HEENT: Normocephalic. No nuchal rigidity.  CVS: Regular rate rhythm Respiratory: Vented   Neurological exam Ment: Sedated with propofol , fentanyl  and Precedex . No withdrawal to noxious. Eyes are slightly open by about 1-2 mm, but no further eye opening with stimulation.   CN: Pinpoint pupils. No blink to threat. Absent oculocephalic reflex. Trace corneal reflex bilaterally. Face flaccidly symmetric.  Motor/Sensory: No spontaneous movement or withdrawal to noxious. Tone decreased x 4 Reflexes: 2+ bilateral brachioradialis. 3+ bilateral patellars. 2+ right achilles, 4+ left achilles with clonus.   Medications  Current Facility-Administered Medications:    Ampicillin-Sulbactam (UNASYN) 3 g in sodium chloride  0.9 % 100 mL IVPB, 3 g, Intravenous, Q6H, Hunsucker, Donnice SAUNDERS, MD, Stopped at 04/25/24 0546   Chlorhexidine  Gluconate Cloth 2 % PADS 6 each, 6 each, Topical, Daily, Arloa Folks D, NP, 6 each at 04/25/24 0310   coconut oil, 1 Application, Topical, PRN, Nicholaus Burnard HERO, MD   dexamethasone  (DECADRON ) injection 20 mg, 20 mg, Intravenous, Q24H, 20 mg at 04/24/24 1217 **FOLLOWED BY** [START ON 04/28/2024] dexamethasone  (DECADRON ) injection 10 mg, 10 mg, Intravenous, Q24H, Bowser, Grace E, NP   dexmedetomidine  (PRECEDEX ) 400 MCG/100ML (4 mcg/mL) infusion, 0-1.2 mcg/kg/hr, Intravenous, Titrated, Harris,  Whitney D, NP, Last Rate: 20.3 mL/hr at 04/25/24 1106, 1.2 mcg/kg/hr at 04/25/24 1106   famotidine  (PEPCID ) tablet 20 mg, 20 mg, Per Tube, BID, Maree Harder, MD, 20 mg at 04/25/24 0915   fentaNYL  (SUBLIMAZE ) bolus via infusion 25-100 mcg, 25-100 mcg, Intravenous, Q15 min PRN, Smith, Joshua C, NP, 100 mcg at 04/25/24 0047   fentaNYL  in NS (107mcg/ml) infusion-PREMIX, 0-400 mcg/hr, Intravenous, Continuous, Claudene Chew C, NP, Last Rate: 10 mL/hr at 04/25/24 0900, 100 mcg/hr at 04/25/24 0900   heparin  injection 5,000 Units, 5,000 Units, Subcutaneous, Q8H, Maree Harder, MD, 5,000 Units at 04/25/24 0515   insulin  aspart (novoLOG ) injection 0-15 Units, 0-15 Units, Subcutaneous, Q4H, Maree Harder, MD, 2 Units at 04/24/24 0411   lamoTRIgine  (LAMICTAL ) tablet 200 mg, 200 mg, Per Tube, BID, Michaela Aisha SQUIBB, MD, 200 mg at 04/25/24 0915   levETIRAcetam  (KEPPRA ) undiluted injection 1,500 mg, 1,500 mg, Intravenous, Q12H, Arora, Ashish, MD, 1,500 mg at 04/25/24 0735   norepinephrine  (LEVOPHED ) 16 mg in 250mL (0.064 mg/mL) premix infusion, 0-40 mcg/min, Intravenous, Titrated, Izell Harari, MD, Stopped at 04/24/24 1350   ondansetron  (ZOFRAN ) injection 4 mg, 4 mg, Intravenous, Q6H PRN, Maree Harder, MD   Oral care mouth rinse, 15 mL, Mouth Rinse, Q2H, Maree Harder, MD, 15 mL at 04/25/24 1036   Oral care mouth rinse, 15 mL, Mouth Rinse, PRN, Maree Harder, MD, 15 mL at 04/23/24 0546   propofol  (DIPRIVAN ) 1000 MG/100ML infusion, 0-80 mcg/kg/min, Intravenous, Titrated, Dela Sheela Kieth HERO, MD, Last  Rate: 23.4 mL/hr at 04/25/24 0900, 55 mcg/kg/min at 04/25/24 0900  Labs and Diagnostic Imaging   CBC:  Recent Labs  Lab 04/23/24 0850 04/23/24 0928 04/24/24 0431 04/25/24 0521  WBC 8.4  --  28.3* 26.7*  NEUTROABS 7.6  --  25.8*  --   HGB 9.8*   < > 10.7* 9.2*  HCT 28.3*   < > 31.9* 28.2*  MCV 84.7  --  88.1 90.1  PLT 205   < > 282 264   < > = values in this interval not displayed.     Basic Metabolic Panel:  Lab Results  Component Value Date   NA 145 04/25/2024   K 4.8 04/25/2024   CO2 23 04/25/2024   GLUCOSE 105 (H) 04/25/2024   BUN 19 04/25/2024   CREATININE 0.99 04/25/2024   CALCIUM  8.3 (L) 04/25/2024   GFRNONAA >60 04/25/2024   GFRAA >60 07/21/2018   Lipid Panel:  Lab Results  Component Value Date   LDLCALC 100 (H) 04/22/2024   HgbA1c:  Lab Results  Component Value Date   HGBA1C 3.8 (L) 04/23/2024   Urine Drug Screen:     Component Value Date/Time   LABOPIA NONE DETECTED 04/23/2024 0107   COCAINSCRNUR POSITIVE (A) 04/23/2024 0107   COCAINSCRNUR POSITIVE (A) 08/21/2021 0220   LABBENZ POSITIVE (A) 04/23/2024 0107   AMPHETMU NONE DETECTED 04/23/2024 0107   THCU POSITIVE (A) 04/23/2024 0107   LABBARB NONE DETECTED 04/23/2024 0107    Alcohol Level     Component Value Date/Time   ETH <10 02/22/2023 0007   INR  Lab Results  Component Value Date   INR 1.1 04/23/2024   APTT  Lab Results  Component Value Date   APTT 26 04/23/2024   AED levels:  Lab Results  Component Value Date   LAMOTRIGINE  2.4 04/11/2024   LEVETIRACETA 25.0 04/11/2024   CT Head without contrast(Personally reviewed): Negative   MRI Brain(Personally reviewed): No acute findings   rEEG:  General irregular slow activity.  No seizures   Assessment  Colleen Pearson is a 33 y.o. female with a PMHxy of seizure disorder and polysubstance abuse who presented at [redacted] weeks pregnant with breakthrough seizure. UDS cocaine and THC positive. She was taken for emergent C-section due to fetal heart decelerations and has delivered a baby. She remains intubated and with a limited exam on sedation - Keppra  dose increased to 750 mg every 6 hours, lamotrigine  200 twice daily. - MRI of the brain does not show any evidence of posterior reversible encephalopathy syndrome or any other acute abnormalities. - LTM EEG report (11/3): Continuous slow, generalized. Burst attenuation,  generalized. This study is suggestive of diffuse cerebral dysfunction (encephalopathy) likely related to sedation. No seizures or epileptiform discharges were seen throughout the recording. - LTM EEG report for today: Continuous slow, generalized. This study is suggestive of diffuse cerebral dysfunction (encephalopathy) likely related to sedation. No seizures or epileptiform discharges were seen throughout the recording. 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  Less likely to be eclampsia.   Recommendations  I would recommend keeping her on Keppra  twice daily dosing at 1500 mg BID for now and after she is extubated, reduce the dose to her home dose of 1000 twice daily, while she is still on LTM. Continue her lamotrigine  at 200 twice daily. Maintain seizure precautions As far as her sedation goes, would attempt to wean off gradually.   If she needs sedation  for clinical seizure activity or vent management, there is room to go up on propofol . She remains on LTM-we will watch her on LTM, till there is a good clinical exam and medication dosages have been adjusted. Neurology will continue to follow ______________________________________________________________________   CRITICAL CARE Performed by: MERRIANNE, Marigny Borre  Total critical care time: 35 minutes  Critical care time was exclusive of separately billable procedures and treating other patients.  Critical care was necessary to treat or prevent imminent or life-threatening deterioration.  Critical care was time spent personally by me on the following activities: development of treatment plan with patient and/or surrogate as well as nursing, discussions with consultants, evaluation of patient's response to treatment, examination of patient, obtaining history from patient or surrogate, ordering and performing treatments and interventions, ordering and review of laboratory studies, ordering  and review of radiographic studies, pulse oximetry and re-evaluation of patient's condition.   SignedMERRIANNE Kamayah Pillay, MD [facility]

## 2024-04-25 NOTE — Procedures (Signed)
 Patient Name: Colleen Pearson  MRN: [id number]  Epilepsy Attending: Arlin MALVA Krebs  Referring Physician/Provider: Michaela Aisha SQUIBB, MD  Duration: 04/24/2024 1030 to 04/25/2024 1030   Patient history: 33 y.o. female past history of seizure, polysubstance abuse who is [redacted] weeks pregnant, brought in for breakthrough seizure. EEG to evaluate for seizure   Level of alertness: comatose/ lethargic    AEDs during EEG study: LEV, LTG, Propofol    Technical aspects: This EEG study was done with scalp electrodes positioned according to the 10-20 International system of electrode placement. Electrical activity was reviewed with band pass filter of 1-70Hz , sensitivity of 7 uV/mm, display speed of 67mm/sec with a 60Hz  notched filter applied as appropriate. EEG data were recorded continuously and digitally stored.  Video monitoring was available and reviewed as appropriate.   Description: EEG showed continuous generalized 3 to 6 Hz theta-delta slowing admixed with 15 to 18 Hz beta activity distributed symmetrically and diffusely. Hyperventilation and photic stimulation were not performed.      ABNORMALITY - Continuous slow, generalized   IMPRESSION: This study is suggestive of diffuse cerebral dysfunction (encephalopathy) likely related to sedation. No seizures or epileptiform discharges were seen throughout the recording.   Nathalya Wolanski O Jla Reynolds

## 2024-04-25 NOTE — Progress Notes (Signed)
 LTM maint complete - no skin breakdown under: Fp1 Fp2 A1 Serviced O1 O2 Reapplied head wrap [facility] monitored, Event button test confirmed by [facility].

## 2024-04-25 NOTE — Progress Notes (Signed)
 Initial Nutrition Assessment  DOCUMENTATION CODES:   Not applicable  INTERVENTION:  If unable to extubate and CCM wants to initiate tube feeds, recommend: Initiate tube feeding via OG: initiate at 25 ml/h and increase 10 ml q10h until goal rate reached: Osmolite 1.5 at 45 ml/h (1080 ml per day) Provides 1620 kcal, 67 gm protein, 822 ml free water  daily  Pt may be at refeed risk given limited diet hx and hx of substance abuse; would recommend monitoring electrolytes and supplementing thiamine 100 mg daily if tube feeding is initiated  If able to extubate and take PO diet, recommend: Liberalized regular diet ONS: Ensure Plus High Protein po BID, each supplement provides 350 kcal and 20 grams of protein. Encourage adequate PO to help meet increased needs for post op healing  NUTRITION DIAGNOSIS:   Inadequate oral intake related to inability to eat as evidenced by NPO status.  GOAL:   Patient will meet greater than or equal to 90% of their needs  MONITOR:   Diet advancement, Vent status  REASON FOR ASSESSMENT:   Consult Assessment of nutrition requirement/status  ASSESSMENT:   Pt with hx of epilepsy and polysubstance disorder who underwent emergent C-section at 32 weeks due to several seizures that led to fetal distress. Pt admitted from [facility] and placed on ventilator.  11/1 transfer from [facility] 11/2 admitted to ICU, intubated, emergent C-section  Attempted weaning sedation yesterday for SBT but pt's mentation remains poor. Pt discussed during ICU rounds and with CCM NP. Possible plan to extubate later today, if pt able to extubate RN will conduct swallow eval. If unable to extubate, CCM may want to initiate tube feeds. Discussed leaving recommendations in note for either outcome. No visitors at bedside at time of assessment. Unable to obtain diet/wt hx. Per chart review, pt's wt appears fairly stable since June. Last OBGYN appointment noted pt gaining total 15 # 14.4 oz during  pregnancy, no issues noted by OBGYN about pt's weight gain during pregnancy. Nutrition focused physical exam shows mild muscle depletions, but unable to fully assess fat stores. Pt does not meet criteria for malnutrition with limited information available.   Patient is currently intubated on ventilator support MV: 9 L/min Temp (24hrs), Avg:97.9 F (36.6 C), Min:97.2 F (36.2 C), Max:98.9 F (37.2 C) MAP (cuff): 70-16mmHg  Propofol : was 12.74 ml/hr this morning but now off  Admit weight: 70.8 kg  Current weight: 62.9 **pt had emergent C section 11/2**    Intake/Output Summary (Last 24 hours) at 04/25/2024 1344 Last data filed at 04/25/2024 1200 Gross per 24 hour  Intake 1135.02 ml  Output 480 ml  Net 655.02 ml   Net IO Since Admission: 1,264.25 mL [04/25/24 1344]  Drains/Lines: CVC triple lumen R internal jugular OG gastric per x ray Urethral Catheter: UOP 691 ml x 24 hr  Nutritionally Relevant Medications: Scheduled Meds:  dexamethasone  (DECADRON ) injection  20 mg Intravenous Q24H   Followed by   NOREEN ON 04/28/2024] dexamethasone  (DECADRON ) injection  10 mg Intravenous Q24H   famotidine   20 mg Per Tube BID   insulin  aspart  0-15 Units Subcutaneous Q4H   lamoTRIgine   200 mg Per Tube BID   levETIRAcetam   1,500 mg Intravenous Q12H   Continuous Infusions:  fentaNYL  infusion INTRAVENOUS 50 mcg/hr (04/25/24 0800)   PRN Meds:.coconut oil, fentaNYL , ondansetron  (ZOFRAN ) IV, mouth rinse  Labs Reviewed: Chloride 115 CBG ranges from 101-114 mg/dL over the last 24 hours HgbA1c 3.8  NUTRITION - FOCUSED PHYSICAL EXAM:  Flowsheet  Row Most Recent Value  Orbital Region Unable to assess  Upper Arm Region No depletion  Thoracic and Lumbar Region Unable to assess  Buccal Region Unable to assess  Temple Region Unable to assess  Clavicle Bone Region Mild depletion  Clavicle and Acromion Bone Region Mild depletion  Scapular Bone Region Mild depletion  Dorsal Hand Unable to  assess  [mittens]  Patellar Region Mild depletion  Anterior Thigh Region Mild depletion  Posterior Calf Region Unable to assess  [edema]  Edema (RD Assessment) Mild  [mild pitting bilateral lower legs]  Hair Reviewed  Eyes Unable to assess  Mouth Unable to assess  Skin Reviewed  Nails Unable to assess    Diet Order:   Diet Order             Diet NPO time specified  Diet effective now                   EDUCATION NEEDS:   Not appropriate for education at this time  Skin:  Skin Assessment: Skin Integrity Issues: Skin Integrity Issues:: Incisions Incisions: abdomen and perineum  Last BM:  unknown  Height:   Ht Readings from Last 1 Encounters:  04/22/24 5' 0.98 (1.549 m)    Weight:   Wt Readings from Last 1 Encounters:  04/25/24 62.9 kg    BMI:  Body mass index is 26.22 kg/m.  Estimated Nutritional Needs:   Kcal:  1600-1800  Protein:  65-85g  Fluid:  1.6-1.8L    Josette Glance, MS, RDN, LDN Clinical Dietitian I Please reach out via secure chat

## 2024-04-25 NOTE — Progress Notes (Signed)
 PCCM INTERVAL PROGRESS NOTE   Slowly weaned sedation throughout the day. Added precedex  and was able to wean off fentanyl  and propofol . She remained very slow to arouse well into the afternoon and struggled to wean on SBT. SHe did eventually tolerate SBT well, but was too lethargic to extubate. Then she became quite agitated to the extent of needing to be restrained by staff and re-sedated with PRN medications.      Deward Eastern, AGACNP-BC [place] [facility]  See Amion for personal pager PCCM on call pager [phone] until 7pm. Please call Elink 7p-7a. [phone]  04/25/2024 3:59 PM

## 2024-04-25 NOTE — Progress Notes (Signed)
 NAME:  Colleen Pearson, MRN:  [id number], DOB:  1991-02-02, LOS: 2 ADMISSION DATE:  04/23/2024, CONSULTATION DATE:  04/22/2024 REFERRING MD:  Oneil Budge, MD, CHIEF COMPLAINT:  Status Epilepticus   History of Present Illness:  33 y/o female with h/o Epilepsy and Polysubstance d/o who is a [id number] and is currently [redacted] weeks pregnant who was on the cough and then have several seizures.  EMS gave 4mg  IV Magnesium  and 5mg  IV Midazolam .  She was lethargic with secretions not able to protect her airways and she was intubated in the ED.  She has been started on Versed  drip.  She was loaded with Keppra .  There has been reports of non-compliance with her seizure meds-Keppra  and Lamictal .  She also had low levels as outpatient.  She has seizures in September as well, similar situation and apparently has a seizure two weeks prior to that ED visit.  Pertinent  Medical History  Epilepsy and Polysubstance d/o, CF carrier  Significant Hospital Events: Including procedures, antibiotic start and stop dates in addition to other pertinent events   11/1: transfer to [facility] from [facility] 11/2 admitted to ICU intubated and sedated signs of fetal distress resulting in emergent C-section 11/3 remains intubated deeply sedated on LTM  Interim History / Subjective:  Remains intubated, sedated   Objective    Blood pressure (!) 103/57, pulse 67, temperature 98.7 F (37.1 C), temperature source Axillary, resp. rate (!) 28, weight 62.9 kg, SpO2 98%, unknown if currently breastfeeding.    Vent Mode: PRVC FiO2 (%):  [40 %-50 %] 40 % Set Rate:  [28 bmp] 28 bmp Vt Set:  [350 mL] 350 mL PEEP:  [5 cmH20-8 cmH20] 5 cmH20 Plateau Pressure:  [13 cmH20-19 cmH20] 13 cmH20   Intake/Output Summary (Last 24 hours) at 04/25/2024 0846 Last data filed at 04/25/2024 0800 Gross per 24 hour  Intake 2120.3 ml  Output 576 ml  Net 1544.3 ml   Filed Weights   04/23/24 0500 04/24/24 0500 04/25/24 0500  Weight: 70.8 kg 67.8 kg 62.9 kg     Examination:  General: Young adult female in NAD on vent HEENT: RRR, no MRG Neuro: Deeply sedated RASS -4 CV: RRR, no MRG PULM: diminished bases, synchronous with vent.  GI: Soft, NT, ND Extremities: warm/dry, no edema   Resolved problem list   Assessment and Plan   Sz disorder w status epilepticus, not compliant with medications at baseline Substance use disorder  -MRI w/o acute abnormality  -CT c spine without abnormality and there is no reported fall/trauma in review of [facility] notes and our ntoes  P: Management per neurology Keppra  1500 BID while intubated, then to 1000 mg BID per neuro Continue LTM Substance abuse cessation instruction when feasible.   S/p emergent C-section at 32 weeks  - Fetal distress noticed overnight of admission resulting in decision for emergent C-section P: Obstetric care per OB Monitor for uterine bleeding  Acute hypoxic and hypercarbic respiratory failure Aspiration PNA  P: Wean sedation today Agitated delirium has inhibited extubation so far, depending on how SBT goes may just need to extubate despite some agitation considering substance abuse history.   At risk for AKI - Creatinine slightly increased a.m. 11/2 but GFR remains greater than 60 Hyperkalemia P: Follow renal function  Monitor urine output Trend Bmet Avoid nephrotoxins, ensure adequate renal perfusion  IV hydration One-time dose of IV Lokelma  Hypotension -medication related v undifferentiated shock state versus heavy sedation requirement P: Sedation likely contributing to hypotension Continue  empiric antibiotics  Metabolic acidosis -likely 2/2 sz P: Resolved  Hypokalemia now with hyperkalemia P: - Trend chemistry  Leukocytosis: steroids, pneumonia, acute phase reactant - trend  Signature   Critical care time 42 mins  Deward Eastern, AGACNP-BC [place] [facility]  See Amion for personal pager PCCM on call pager [phone] until  7pm. Please call Elink 7p-7a. [phone]  04/25/2024 8:54 AM

## 2024-04-25 NOTE — Plan of Care (Signed)
  Problem: Education: Goal: Ability to describe self-care measures that may prevent or decrease complications (Diabetes Survival Skills Education) will improve Outcome: Progressing Goal: Individualized Educational Video(s) Outcome: Progressing   Problem: Coping: Goal: Ability to adjust to condition or change in health will improve Outcome: Progressing   Problem: Fluid Volume: Goal: Ability to maintain a balanced intake and output will improve Outcome: Progressing   Problem: Health Behavior/Discharge Planning: Goal: Ability to identify and utilize available resources and services will improve Outcome: Progressing Goal: Ability to manage health-related needs will improve Outcome: Progressing   Problem: Metabolic: Goal: Ability to maintain appropriate glucose levels will improve Outcome: Progressing   Problem: Nutritional: Goal: Maintenance of adequate nutrition will improve Outcome: Progressing Goal: Progress toward achieving an optimal weight will improve Outcome: Progressing   Problem: Skin Integrity: Goal: Risk for impaired skin integrity will decrease Outcome: Progressing   Problem: Tissue Perfusion: Goal: Adequacy of tissue perfusion will improve Outcome: Progressing   Problem: Education: Goal: Knowledge of General Education information will improve Description: Including pain rating scale, medication(s)/side effects and non-pharmacologic comfort measures Outcome: Progressing   Problem: Health Behavior/Discharge Planning: Goal: Ability to manage health-related needs will improve Outcome: Progressing   Problem: Clinical Measurements: Goal: Ability to maintain clinical measurements within normal limits will improve Outcome: Progressing Goal: Will remain free from infection Outcome: Progressing Goal: Diagnostic test results will improve Outcome: Progressing Goal: Respiratory complications will improve Outcome: Progressing Goal: Cardiovascular complication will  be avoided Outcome: Progressing   Problem: Activity: Goal: Risk for activity intolerance will decrease Outcome: Progressing   Problem: Nutrition: Goal: Adequate nutrition will be maintained Outcome: Progressing   Problem: Coping: Goal: Level of anxiety will decrease Outcome: Progressing   Problem: Elimination: Goal: Will not experience complications related to bowel motility Outcome: Progressing Goal: Will not experience complications related to urinary retention Outcome: Progressing   Problem: Pain Managment: Goal: General experience of comfort will improve and/or be controlled Outcome: Progressing   Problem: Safety: Goal: Ability to remain free from injury will improve Outcome: Progressing   Problem: Skin Integrity: Goal: Risk for impaired skin integrity will decrease Outcome: Progressing   Problem: Safety: Goal: Non-violent Restraint(s) Outcome: Progressing   Problem: Education: Goal: Knowledge of the prescribed therapeutic regimen will improve Outcome: Progressing Goal: Understanding of sexual limitations or changes related to disease process or condition will improve Outcome: Progressing Goal: Individualized Educational Video(s) Outcome: Progressing   Problem: Self-Concept: Goal: Communication of feelings regarding changes in body function or appearance will improve Outcome: Progressing   Problem: Skin Integrity: Goal: Demonstration of wound healing without infection will improve Outcome: Progressing   Problem: Education: Goal: Knowledge of condition will improve Outcome: Progressing Goal: Individualized Educational Video(s) Outcome: Progressing Goal: Individualized Newborn Educational Video(s) Outcome: Progressing   Problem: Activity: Goal: Will verbalize the importance of balancing activity with adequate rest periods Outcome: Progressing Goal: Ability to tolerate increased activity will improve Outcome: Progressing   Problem: Coping: Goal:  Ability to identify and utilize available resources and services will improve Outcome: Progressing   Problem: Life Cycle: Goal: Chance of risk for complications during the postpartum period will decrease Outcome: Progressing   Problem: Role Relationship: Goal: Ability to demonstrate positive interaction with newborn will improve Outcome: Progressing   Problem: Skin Integrity: Goal: Demonstration of wound healing without infection will improve Outcome: Progressing

## 2024-04-25 NOTE — Progress Notes (Signed)
 eLink Physician-Brief Progress Note Patient Name: Colleen Pearson DOB: 01/01/1991 MRN: [id number]   Date of Service  04/25/2024  HPI/Events of Note  33 year old female with a history of seizure disorder and polysubstance abuse who presented at [redacted] weeks pregnant with breakthrough seizure and status epilepticus on multiple AEDs and deep sedation.  Goal is been to reduce her sedation overall-reviewed neurology notes tonight and the goal is to minimize fentanyl  as tolerated and maintain propofol .  Unfortunately, with reduction of fentanyl  and stable propofol , the patient is actively trying to come out of the bed and smashing her arms and legs on bed rails-does not seem redirectable at this time.  Requiring 4 staff to hold the patient down for safety.  Posey belt in place.  eICU Interventions  Escalate restraints for patient safety-add bilateral wrist restraints, maintain Posey belt.  Escalate the propofol  dosing on verbal order from 40 mg to 60 mg.  Maintain fentanyl  which is currently at 300 mcg and wean this as tolerated.    Goal for spontaneous awakening trial this morning.     Intervention Category Minor Interventions: Agitation / anxiety - evaluation and management  Jeena Arnett 04/25/2024, 12:51 AM

## 2024-04-26 ENCOUNTER — Inpatient Hospital Stay (HOSPITAL_COMMUNITY)

## 2024-04-26 DIAGNOSIS — J9602 Acute respiratory failure with hypercapnia: Secondary | ICD-10-CM | POA: Diagnosis not present

## 2024-04-26 DIAGNOSIS — G40909 Epilepsy, unspecified, not intractable, without status epilepticus: Secondary | ICD-10-CM | POA: Diagnosis not present

## 2024-04-26 DIAGNOSIS — G40901 Epilepsy, unspecified, not intractable, with status epilepticus: Secondary | ICD-10-CM | POA: Diagnosis not present

## 2024-04-26 DIAGNOSIS — J690 Pneumonitis due to inhalation of food and vomit: Secondary | ICD-10-CM | POA: Diagnosis not present

## 2024-04-26 DIAGNOSIS — J9601 Acute respiratory failure with hypoxia: Secondary | ICD-10-CM | POA: Diagnosis not present

## 2024-04-26 LAB — BASIC METABOLIC PANEL WITH GFR
Anion gap: 12 (ref 5–15)
BUN: 29 mg/dL — ABNORMAL HIGH (ref 6–20)
CO2: 22 mmol/L (ref 22–32)
Calcium: 8 mg/dL — ABNORMAL LOW (ref 8.9–10.3)
Chloride: 112 mmol/L — ABNORMAL HIGH (ref 98–111)
Creatinine, Ser: 0.97 mg/dL (ref 0.44–1.00)
GFR, Estimated: >60 mL/min (ref >60)
Glucose, Bld: 116 mg/dL — ABNORMAL HIGH (ref 70–99)
Potassium: 4.2 mmol/L (ref 3.5–5.1)
Sodium: 146 mmol/L — ABNORMAL HIGH (ref 135–145)

## 2024-04-26 LAB — GLUCOSE, CAPILLARY
Glucose-Capillary: 102 mg/dL — ABNORMAL HIGH (ref 70–99)
Glucose-Capillary: 105 mg/dL — ABNORMAL HIGH (ref 70–99)
Glucose-Capillary: 71 mg/dL (ref 70–99)
Glucose-Capillary: 66 mg/dL — ABNORMAL LOW (ref 70–99)
Glucose-Capillary: 66 mg/dL — ABNORMAL LOW (ref 70–99)
Glucose-Capillary: 61 mg/dL — ABNORMAL LOW (ref 70–99)
Glucose-Capillary: 65 mg/dL — ABNORMAL LOW (ref 70–99)
Glucose-Capillary: 67 mg/dL — ABNORMAL LOW (ref 70–99)
Glucose-Capillary: 91 mg/dL (ref 70–99)
Glucose-Capillary: 93 mg/dL (ref 70–99)

## 2024-04-26 LAB — PHOSPHORUS: Phosphorus: 3.8 mg/dL (ref 2.5–4.6)

## 2024-04-26 LAB — CBC
HCT: 28.3 % — ABNORMAL LOW (ref 36.0–46.0)
Hemoglobin: 9.4 g/dL — ABNORMAL LOW (ref 12.0–15.0)
MCH: 29.7 pg (ref 26.0–34.0)
MCHC: 33.2 g/dL (ref 30.0–36.0)
MCV: 89.3 fL (ref 80.0–100.0)
Platelets: 228 K/uL (ref 150–400)
RBC: 3.17 MIL/uL — ABNORMAL LOW (ref 3.87–5.11)
RDW: 16.4 % — ABNORMAL HIGH (ref 11.5–15.5)
WBC: 24.7 K/uL — ABNORMAL HIGH (ref 4.0–10.5)
nRBC: 0.2 % (ref 0.0–0.2)

## 2024-04-26 LAB — MAGNESIUM: Magnesium: 2.4 mg/dL (ref 1.7–2.4)

## 2024-04-26 MED ORDER — THIAMINE MONONITRATE 100 MG PO TABS
100.0000 mg | ORAL_TABLET | Freq: Every day | ORAL | Status: DC
  Administered 2024-04-26 – 2024-04-28 (×3): 100 mg
  Filled 2024-04-26 (×3): qty 1

## 2024-04-26 MED ORDER — OSMOLITE 1.5 CAL PO LIQD
1000.0000 mL | ORAL | Status: DC
  Administered 2024-04-26 – 2024-05-07 (×10): 1000 mL
  Filled 2024-04-26 (×2): qty 1000

## 2024-04-26 MED ORDER — DEXTROSE 50 % IV SOLN
INTRAVENOUS | Status: AC
  Filled 2024-04-26: qty 50

## 2024-04-26 MED ORDER — PROSOURCE TF20 ENFIT COMPATIBL EN LIQD
60.0000 mL | Freq: Every day | ENTERAL | Status: DC
  Administered 2024-04-26 – 2024-04-28 (×3): 60 mL
  Filled 2024-04-26 (×3): qty 60

## 2024-04-26 MED ORDER — QUETIAPINE FUMARATE 50 MG PO TABS
50.0000 mg | ORAL_TABLET | Freq: Two times a day (BID) | ORAL | Status: DC
  Administered 2024-04-26: 50 mg
  Filled 2024-04-26 (×2): qty 1

## 2024-04-26 MED ORDER — CLONAZEPAM 1 MG PO TABS
1.0000 mg | ORAL_TABLET | Freq: Two times a day (BID) | ORAL | Status: DC
  Administered 2024-04-26: 1 mg
  Filled 2024-04-26 (×2): qty 1

## 2024-04-26 MED ORDER — DEXTROSE 50 % IV SOLN: 12.5000 g | INTRAVENOUS | Status: AC

## 2024-04-26 MED ORDER — DEXTROSE 50 % IV SOLN
INTRAVENOUS | Status: AC
  Administered 2024-04-26: 12.5 g via INTRAVENOUS
  Filled 2024-04-26: qty 50

## 2024-04-26 NOTE — Progress Notes (Signed)
 NAME:  Colleen Pearson, MRN:  [id number], DOB:  [birth year], LOS: 3 ADMISSION DATE:  04/23/2024, CONSULTATION DATE:  04/22/2024 REFERRING MD:  Oneil Budge, MD, CHIEF COMPLAINT:  Status Epilepticus   History of Present Illness:  33 y/o female with h/o Epilepsy and Polysubstance d/o who is a [phone] and is currently [redacted] weeks pregnant who was on the cough and then have several seizures.  EMS gave 4mg  IV Magnesium  and 5mg  IV Midazolam .  She was lethargic with secretions not able to protect her airways and she was intubated in the ED.  She has been started on Versed  drip.  She was loaded with Keppra .  There has been reports of non-compliance with her seizure meds-Keppra  and Lamictal .  She also had low levels as outpatient.  She has seizures in September as well, similar situation and apparently has a seizure two weeks prior to that ED visit.  Pertinent  Medical History  Epilepsy and Polysubstance d/o, CF carrier  Significant Hospital Events: Including procedures, antibiotic start and stop dates in addition to other pertinent events   11/1: transfer to [facility] from [facility] 11/2 admitted to ICU intubated and sedated signs of fetal distress resulting in emergent C-section 11/3 remains intubated deeply sedated on LTM 11/4 sedation lightened > agitation  Interim History / Subjective:  Sedation lightened on 11/4, was initially able to come off propofol , fentanyl  and managed with Precedex .  Increasing agitation required reinitiation of both the fentanyl  and the propofol .  Precedex  decreased, was associated with some bradycardia arrhythmia:  Precedex  0.3, fentanyl  50, propofol  20 0.40, PEEP 5 I/O+ 1.9 L total, urine output 285 cc / 24 hours  Objective    Blood pressure 102/74, pulse (!) 46, temperature 97.7 F (36.5 C), resp. rate 13, weight 63 kg, SpO2 99%, unknown if currently breastfeeding.    Vent Mode: PRVC FiO2 (%):  [40 %] 40 % Set Rate:  [18 bmp] 18 bmp Vt Set:  [350 mL] 350 mL PEEP:  [5 cmH20] 5  cmH20 Pressure Support:  [5 cmH20] 5 cmH20 Plateau Pressure:  [10 cmH20] 10 cmH20   Intake/Output Summary (Last 24 hours) at 04/26/2024 0834 Last data filed at 04/26/2024 0600 Gross per 24 hour  Intake 1000.69 ml  Output 200 ml  Net 800.69 ml   Filed Weights   04/24/24 0500 04/25/24 0500 04/26/24 0708  Weight: 67.8 kg 62.9 kg 63 kg    Examination:  General: Ill-appearing young woman, agitated on mechanical ventilation HEENT: ET tube in position, pupils equal Neuro: Easily agitated.  Opens eyes, does not track, does not follow commands, moves upper and lower extremities spontaneously with good strength.  May have moved her arm purposefully but not consistently CV: Regular, tachycardic, no murmur PULM: Synchronous with ventilator, strong cough, few rhonchi, mostly clear GI: Nondistended with positive bowel sounds.  Incision intact, no active bleeding Extremities: No edema  Chest x-ray 11/3 reviewed by me showed clear on the left, some patchy alveolar right basilar infiltrate, improved compared with prior  Resolved problem list  Hyperkalemia Shock Lactic acidosis  Assessment and Plan   Sz disorder w status epilepticus, not compliant with medications at baseline Substance use disorder  Multifactorial encephalopathy -MRI w/o acute abnormality  -CT c spine without abnormality and there is no reported fall/trauma in review of [facility] notes and our ntoes  P: - Appreciate neurology input - Keppra  as ordered, currently 1500 mg twice daily with plan to continue to wean to 1000 mg twice daily going forward -Remains  on continuous EEG - Balancing her sedating medications complicated, has had significant agitation as sedation is lightened but goal remains to push for wake up to facilitate spontaneous breathing as long as no evidence for recurrent seizure activity - Watch for any evidence of withdrawal.  She will need substance-abuse cessation counseling when feasible to do so  S/p emergent  C-section at 32 weeks  - Fetal distress noticed overnight of admission resulting in decision for emergent C-section P: -Appreciate obstetric care by OB - Following for any evidence uterine bleeding  Acute hypoxic and hypercarbic respiratory failure Aspiration PNA  P: -Would like to push for SBT and possible extubation.  Agitated delirium is currently a barrier, working to wean sedating medication as she can tolerate.  May be able to go back to Precedex  but she has had some bradycardia with this - Plan to complete 5-day course of Unasyn on 11/6 - Repeat chest x-ray.  No clear evidence for ARDS based on chest x-ray 11/3 and current vent requirements.  Will discontinue dexamethasone  for now  At risk for AKI, SCr reassuring 11/5 Hyperkalemia, resolved P: -Continue to follow intermittent BMP, urine output - Avoid nephrotoxins and assure adequate renal perfusion  Leukocytosis: steroids, pneumonia, acute phase reactant -Following trend  Signature   Critical care time 35 minutes  Lamar Chris, MD, PhD 04/26/2024, 8:48 AM [facility] [phone] or if no answer before 7:00PM call [phone] For any issues after 7:00PM please call eLink [phone]

## 2024-04-26 NOTE — Progress Notes (Signed)
 Nutrition Brief Note  Patient discussed in interdisciplinary rounds.  Mental status continues to preclude extubation.  CCM amenable to initiation of tube feeds.   Tube feed orders placed per RD recommendations yesterday.   Initiate tube feeding via OG:  Osmolite 1.5 at 45 ml/h (1080 ml per day) Initiate at 25 ml/h and increase 10 ml q10h until goal rate reached: Provides 1620 kcal, 67 gm protein, 822 ml free water  daily  Will continue to follow and adjust nutrition recommendations as appropriate.  Please reach out via secure chat in the meantime with urgent nutrition related concerns.  Allie Anisah Kuck, RDN, LDN Clinical Nutrition See AMiON for contact information.

## 2024-04-26 NOTE — Procedures (Signed)
 Patient Name: KAROLINE FLEER  MRN: [id number]  Epilepsy Attending: Arlin MALVA Krebs  Referring Physician/Provider: Michaela Aisha SQUIBB, MD  Duration: 04/25/2024 1030 to 04/26/2024 1030   Patient history: 33 y.o. female past history of seizure, polysubstance abuse who is [redacted] weeks pregnant, brought in for breakthrough seizure. EEG to evaluate for seizure   Level of alertness: comatose/ lethargic    AEDs during EEG study: LEV, LTG, Propofol    Technical aspects: This EEG study was done with scalp electrodes positioned according to the 10-20 International system of electrode placement. Electrical activity was reviewed with band pass filter of 1-70Hz , sensitivity of 7 uV/mm, display speed of 65mm/sec with a 60Hz  notched filter applied as appropriate. EEG data were recorded continuously and digitally stored.  Video monitoring was available and reviewed as appropriate.   Description: EEG showed continuous generalized 3 to 6 Hz theta-delta slowing. Hyperventilation and photic stimulation were not performed.     ABNORMALITY - Continuous slow, generalized   IMPRESSION: This study is suggestive of diffuse cerebral dysfunction (encephalopathy) likely related to sedation. No seizures or epileptiform discharges were seen throughout the recording.   Anjanae Woehrle O Jairon Ripberger

## 2024-04-26 NOTE — Progress Notes (Signed)
 NEUROLOGY PROGRESS NOTE   Date of service: April 26, 2024 Patient Name: Colleen Pearson MRN:  [id number] DOB:  [birth year]  Subjective  Sedation is lower today. Still becomes very agitated with attempts to wean.   Past History   Past Medical History:  Diagnosis Date   Anemia    Asthma    Cocaine use complicating pregnancy in third trimester 08/30/2017   Endometriosis    Nexplanon  in place 11/04/2020   Ovarian cyst    Seizures (HCC)    Status post cesarean section 08/30/2017    Past Surgical History:  Procedure Laterality Date   CESAREAN SECTION N/A 08/30/2017   Procedure: CESAREAN SECTION;  Surgeon: Leonce Garnette BIRCH, MD;  Location: [facility] ORS;  Service: Obstetrics;  Laterality: N/A;   CESAREAN SECTION N/A 04/23/2024   Procedure: CESAREAN DELIVERY;  Surgeon: Izell Harari, MD;  Location: [facility] ORS;  Service: Obstetrics;  Laterality: N/A;   LASER ABLATION/CAUTERIZATION OF ENDOMETRIAL IMPLANTS      Family History: Family History  Problem Relation Age of Onset   Down syndrome Sister     Social History  reports that she has quit smoking. Her smoking use included cigarettes and e-cigarettes. She has never used smokeless tobacco. She reports that she does not currently use drugs after having used the following drugs: Marijuana and Cocaine. She reports that she does not drink alcohol.  Allergies  Allergen Reactions   Penicillins Hives and Swelling    Tolerated Zosyn  02/24/23   Ultram [Tramadol] Hives and Swelling    Medications   Current Facility-Administered Medications:    Ampicillin-Sulbactam (UNASYN) 3 g in sodium chloride  0.9 % 100 mL IVPB, 3 g, Intravenous, Q6H, Hunsucker, Donnice SAUNDERS, MD, Last Rate: 200 mL/hr at 04/26/24 1807, 3 g at 04/26/24 1807   Chlorhexidine  Gluconate Cloth 2 % PADS 6 each, 6 each, Topical, Daily, Harris, Whitney D, NP, 6 each at 04/26/24 0210   clonazePAM (KLONOPIN) tablet 1 mg, 1 mg, Per Tube, BID, Byrum, Robert S, MD, 1 mg at 04/26/24  1543   coconut oil, 1 Application, Topical, PRN, Nicholaus Burnard HERO, MD   dexmedetomidine  (PRECEDEX ) 400 MCG/100ML (4 mcg/mL) infusion, 0-1.2 mcg/kg/hr, Intravenous, Titrated, Harris, Whitney D, NP, Last Rate: 8.48 mL/hr at 04/26/24 1828, 0.5 mcg/kg/hr at 04/26/24 1828   famotidine  (PEPCID ) tablet 20 mg, 20 mg, Per Tube, BID, Maree Harder, MD, 20 mg at 04/26/24 1000   feeding supplement (OSMOLITE 1.5 CAL) liquid 1,000 mL, 1,000 mL, Per Tube, Continuous, Byrum, Robert S, MD, Last Rate: 45 mL/hr at 04/26/24 1800, Infusion Verify at 04/26/24 1800   feeding supplement (PROSource TF20) liquid 60 mL, 60 mL, Per Tube, Daily, Byrum, Lamar RAMAN, MD, 60 mL at 04/26/24 1645   fentaNYL  (SUBLIMAZE ) bolus via infusion 25-100 mcg, 25-100 mcg, Intravenous, Q15 min PRN, Claudene Chew C, NP, 75 mcg at 04/26/24 1603   fentaNYL  in NS (29mcg/ml) infusion-PREMIX, 0-400 mcg/hr, Intravenous, Continuous, Claudene Chew C, NP, Last Rate: 10 mL/hr at 04/26/24 1800, 100 mcg/hr at 04/26/24 1800   heparin  injection 5,000 Units, 5,000 Units, Subcutaneous, Q8H, Maree Harder, MD, 5,000 Units at 04/26/24 1442   insulin  aspart (novoLOG ) injection 0-15 Units, 0-15 Units, Subcutaneous, Q4H, Byrum, Robert S, MD, 2 Units at 04/25/24 1959   lamoTRIgine  (LAMICTAL ) tablet 200 mg, 200 mg, Per Tube, BID, Michaela Aisha SQUIBB, MD, 200 mg at 04/26/24 1000   levETIRAcetam  (KEPPRA ) undiluted injection 1,500 mg, 1,500 mg, Intravenous, Q12H, Arora, Ashish, MD, 1,500 mg at 04/26/24 [phone]  midazolam  PF (VERSED ) injection 2 mg, 2 mg, Intravenous, Q2H PRN, Paliwal, Aditya, MD, 2 mg at 04/26/24 0744   norepinephrine  (LEVOPHED ) 16 mg in 250mL (0.064 mg/mL) premix infusion, 0-40 mcg/min, Intravenous, Titrated, Izell Harari, MD, Stopped at 04/24/24 1350   ondansetron  (ZOFRAN ) injection 4 mg, 4 mg, Intravenous, Q6H PRN, Maree Harder, MD, 4 mg at 04/25/24 1834   Oral care mouth rinse, 15 mL, Mouth Rinse, Q2H, Maree Harder, MD, 15 mL at 04/26/24  1808   Oral care mouth rinse, 15 mL, Mouth Rinse, PRN, Maree Harder, MD, 15 mL at 05-19-24 0546   propofol  (DIPRIVAN ) 1000 MG/100ML infusion, 0-80 mcg/kg/min, Intravenous, Titrated, Mallory Sheela Kieth CHRISTELLA, MD, Last Rate: 12.74 mL/hr at 04/26/24 1800, 30 mcg/kg/min at 04/26/24 1800   QUEtiapine  (SEROQUEL ) tablet 50 mg, 50 mg, Per Tube, BID, Byrum, Lamar RAMAN, MD   thiamine (VITAMIN B1) tablet 100 mg, 100 mg, Per Tube, Daily, Shelah Lamar RAMAN, MD, 100 mg at 04/26/24 1645  Vitals   Vitals:   04/26/24 1700 04/26/24 1800 04/26/24 1804 04/26/24 1923  BP: (!) 101/49  (!) 105/59   Pulse: 90 79    Resp: 11 (!) 21    Temp:    98 F (36.7 C)  TempSrc:    Axillary  SpO2: 98% 100%    Weight:        Body mass index is 26.26 kg/m.   Physical Exam   General: Sedated, intubated  HEENT: Normocephalic. No nuchal rigidity.  CVS: Regular rate rhythm Respiratory: Vented   Neurological exam Ment: Sedated with propofol  (rate of 30), fentanyl  (150) and Precedex  (0.5). Brisk withdrawals of BLE to noxious. Occasionally squirms in bed, but with no purposeful movements. Eyes are more open today. Not responding to voice. No attempts to communicate. Does not fixate or track.   CN: PERRL. No blink to threat. Absent oculocephalic reflex. Intact corneal reflex bilaterally. Face flaccidly symmetric.  Motor/Sensory: Brisk withdrawal of BLE to noxious without asymmetry. Tone decreased in BUE, with slight movement to noxious Reflexes: 2+ bilateral brachioradialis. 3+ bilateral patellars.    Labs/Imaging/Neurodiagnostic studies   CBC:  Recent Labs  Lab 05/19/24 0850 05-19-2024 0928 04/24/24 0431 04/25/24 0521 04/26/24 0524  WBC 8.4  --  28.3* 26.7* 24.7*  NEUTROABS 7.6  --  25.8*  --   --   HGB 9.8*   < > 10.7* 9.2* 9.4*  HCT 28.3*   < > 31.9* 28.2* 28.3*  MCV 84.7  --  88.1 90.1 89.3  PLT 205   < > 282 264 228   < > = values in this interval not displayed.   Basic Metabolic Panel:  Lab Results  Component  Value Date   NA 146 (H) 04/26/2024   K 4.2 04/26/2024   CO2 22 04/26/2024   GLUCOSE 116 (H) 04/26/2024   BUN 29 (H) 04/26/2024   CREATININE 0.97 04/26/2024   CALCIUM  8.0 (L) 04/26/2024   GFRNONAA >60 04/26/2024   GFRAA >60 07/21/2018   Lipid Panel:  Lab Results  Component Value Date   LDLCALC 100 (H) 04/22/2024   HgbA1c:  Lab Results  Component Value Date   HGBA1C 3.8 (L) 19-May-2024   Urine Drug Screen:     Component Value Date/Time   LABOPIA NONE DETECTED 05-19-24 0107   COCAINSCRNUR POSITIVE (A) 2024-05-19 0107   COCAINSCRNUR POSITIVE (A) 08/21/2021 0220   LABBENZ POSITIVE (A) May 19, 2024 0107   AMPHETMU NONE DETECTED May 19, 2024 0107   THCU POSITIVE (A) 05/19/24 0107  LABBARB NONE DETECTED 04/23/2024 0107    Alcohol Level     Component Value Date/Time   ETH <10 02/22/2023 0007   INR  Lab Results  Component Value Date   INR 1.1 04/23/2024   APTT  Lab Results  Component Value Date   APTT 26 04/23/2024   AED levels:  Lab Results  Component Value Date   LAMOTRIGINE  2.4 04/11/2024   LEVETIRACETA 25.0 04/11/2024      ASSESSMENT  Colleen Pearson is a 33 y.o. female with a PMHx of seizure disorder and polysubstance abuse who presented at [redacted] weeks pregnant with breakthrough seizure. UDS was cocaine and THC positive. She was taken for emergent C-section due to fetal heart decelerations and has delivered a baby. She remains intubated with a gradually improving exam on sedation.  - MRI of the brain does not show any evidence of posterior reversible encephalopathy syndrome or any other acute abnormalities. - LTM EEG report (11/3): Continuous slow, generalized. Burst attenuation, generalized. This study is suggestive of diffuse cerebral dysfunction (encephalopathy) likely related to sedation. No seizures or epileptiform discharges were seen throughout the recording. - LTM EEG (11/4): Continuous slow, generalized. This study is suggestive of diffuse cerebral  dysfunction (encephalopathy) likely related to sedation. No seizures or epileptiform discharges were seen throughout the recording. - LTM EEG report for today (11/5): Continuous slow, generalized. This study is suggestive of diffuse cerebral dysfunction (encephalopathy) likely related to sedation. No seizures or epileptiform discharges were seen throughout the recording. 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     RECOMMENDATIONS  Continue Keppra  twice daily dosing at 1500 mg BID for now and after she is extubated, reduce the dose to her home dose of 1000 twice daily, while she is still on LTM.  Continue her lamotrigine  at 200 twice daily. Agree with the addition of Klonopin, which will be helpful if she is withdrawing from EtOH and also to manage possible agitation while weaning off her IV sedatives Maintain seizure precautions Continue to gradually wean off sedation She remains on LTM-we will watch her on LTM, till there is a good clinical exam and medication dosages have been adjusted. Neurology will continue to follow ______________________________________________________________________  CRITICAL CARE Performed by: MERRIANNE, Tiasha Helvie   Total critical care time: 35 minutes  Critical care time was exclusive of separately billable procedures and treating other patients.  Critical care was necessary to treat or prevent imminent or life-threatening deterioration.  Critical care was time spent personally by me on the following activities: development of treatment plan with patient and/or surrogate as well as nursing, discussions with consultants, evaluation of patient's response to treatment, examination of patient, obtaining history from patient or surrogate, ordering and performing treatments and interventions, ordering and review of laboratory studies, ordering and review of radiographic studies, pulse oximetry and re-evaluation of  patient's condition.    SignedMERRIANNE Hyrum Shaneyfelt, MD [facility]

## 2024-04-26 NOTE — Progress Notes (Signed)
 LTM maint complete - no skin breakdown seen.  Serviced several lead. [facility] monitored, Event button test confirmed by [facility].

## 2024-04-26 NOTE — Plan of Care (Signed)
  Problem: Education: Goal: Ability to describe self-care measures that may prevent or decrease complications (Diabetes Survival Skills Education) will improve Outcome: Progressing Goal: Individualized Educational Video(s) Outcome: Progressing   Problem: Coping: Goal: Ability to adjust to condition or change in health will improve Outcome: Progressing   Problem: Fluid Volume: Goal: Ability to maintain a balanced intake and output will improve Outcome: Progressing   Problem: Health Behavior/Discharge Planning: Goal: Ability to identify and utilize available resources and services will improve Outcome: Progressing Goal: Ability to manage health-related needs will improve Outcome: Progressing   Problem: Metabolic: Goal: Ability to maintain appropriate glucose levels will improve Outcome: Progressing   Problem: Nutritional: Goal: Maintenance of adequate nutrition will improve Outcome: Progressing Goal: Progress toward achieving an optimal weight will improve Outcome: Progressing   Problem: Skin Integrity: Goal: Risk for impaired skin integrity will decrease Outcome: Progressing   Problem: Tissue Perfusion: Goal: Adequacy of tissue perfusion will improve Outcome: Progressing   Problem: Education: Goal: Knowledge of General Education information will improve Description: Including pain rating scale, medication(s)/side effects and non-pharmacologic comfort measures Outcome: Progressing   Problem: Health Behavior/Discharge Planning: Goal: Ability to manage health-related needs will improve Outcome: Progressing   Problem: Clinical Measurements: Goal: Ability to maintain clinical measurements within normal limits will improve Outcome: Progressing Goal: Will remain free from infection Outcome: Progressing Goal: Diagnostic test results will improve Outcome: Progressing Goal: Respiratory complications will improve Outcome: Progressing Goal: Cardiovascular complication will  be avoided Outcome: Progressing   Problem: Activity: Goal: Risk for activity intolerance will decrease Outcome: Progressing   Problem: Nutrition: Goal: Adequate nutrition will be maintained Outcome: Progressing   Problem: Coping: Goal: Level of anxiety will decrease Outcome: Progressing   Problem: Elimination: Goal: Will not experience complications related to bowel motility Outcome: Progressing Goal: Will not experience complications related to urinary retention Outcome: Progressing   Problem: Pain Managment: Goal: General experience of comfort will improve and/or be controlled Outcome: Progressing   Problem: Safety: Goal: Ability to remain free from injury will improve Outcome: Progressing   Problem: Skin Integrity: Goal: Risk for impaired skin integrity will decrease Outcome: Progressing   Problem: Safety: Goal: Non-violent Restraint(s) Outcome: Progressing   Problem: Education: Goal: Knowledge of the prescribed therapeutic regimen will improve Outcome: Progressing Goal: Understanding of sexual limitations or changes related to disease process or condition will improve Outcome: Progressing Goal: Individualized Educational Video(s) Outcome: Progressing   Problem: Self-Concept: Goal: Communication of feelings regarding changes in body function or appearance will improve Outcome: Progressing   Problem: Skin Integrity: Goal: Demonstration of wound healing without infection will improve Outcome: Progressing   Problem: Education: Goal: Knowledge of condition will improve Outcome: Progressing Goal: Individualized Educational Video(s) Outcome: Progressing Goal: Individualized Newborn Educational Video(s) Outcome: Progressing   Problem: Activity: Goal: Will verbalize the importance of balancing activity with adequate rest periods Outcome: Progressing Goal: Ability to tolerate increased activity will improve Outcome: Progressing   Problem: Coping: Goal:  Ability to identify and utilize available resources and services will improve Outcome: Progressing   Problem: Life Cycle: Goal: Chance of risk for complications during the postpartum period will decrease Outcome: Progressing   Problem: Role Relationship: Goal: Ability to demonstrate positive interaction with newborn will improve Outcome: Progressing   Problem: Skin Integrity: Goal: Demonstration of wound healing without infection will improve Outcome: Progressing

## 2024-04-26 NOTE — Progress Notes (Signed)
 Postpartum Day 3: Cesarean Delivery Subjective: Patient remains intubated and sedated.    Objective: Vital signs in last 24 hours: Temp:  [97.3 F (36.3 C)-98.7 F (37.1 C)] 97.7 F (36.5 C) (11/05 1124) Pulse Rate:  [45-102] 77 (11/05 0900) Resp:  [12-26] 13 (11/05 0900) BP: (97-133)/(58-91) 133/76 (11/05 0900) SpO2:  [90 %-100 %] 98 % (11/05 1134) FiO2 (%):  [40 %] 40 % (11/05 1134) Weight:  [36 kg] 63 kg (11/05 0708)  Physical Exam:  General: intubated and sedated Lochia: appropriate Uterine Fundus: firm Incision: healing well, no significant drainage, no dehiscence, no significant erythema DVT Evaluation: No evidence of DVT seen on physical exam.  Recent Labs    04/25/24 0521 04/26/24 0524  HGB 9.2* 9.4*  HCT 28.2* 28.3*    Assessment/Plan: Status post Cesarean section. From an obstetric and post op standpoint, she is doing fine Will continue to follow along with Critical Care  and Neurology teams as needed No concerns and new recommendations from our service.  Gloris Hugger, MD 04/26/2024, 11:48 AM Phone: [phone]

## 2024-04-26 NOTE — Progress Notes (Signed)
 vLTM maintenance  Study back online.  All impedances below 15kohms.  Patient has severely matted hair and continues to roll head side to side.  No skin breakdown noted at 01 02 A2  C4

## 2024-04-26 NOTE — Progress Notes (Signed)
 eLink Physician-Brief Progress Note Patient Name: Colleen Pearson DOB: Mar 10, 1991 MRN: [id number]   Date of Service  04/26/2024  HPI/Events of Note  33 y/o female with h/o Epilepsy and Polysubstance d/o who is a [id number] and is currently [redacted] weeks pregnant who was on the cough and then have several seizures.  Ongoing agitation previously restrained  eICU Interventions  Renew previous restraints for patient safety     Intervention Category Minor Interventions: Agitation / anxiety - evaluation and management  Zlatan Hornback 04/26/2024, 8:43 PM

## 2024-04-27 ENCOUNTER — Inpatient Hospital Stay (HOSPITAL_COMMUNITY)

## 2024-04-27 DIAGNOSIS — J9601 Acute respiratory failure with hypoxia: Secondary | ICD-10-CM | POA: Diagnosis not present

## 2024-04-27 DIAGNOSIS — G40901 Epilepsy, unspecified, not intractable, with status epilepticus: Secondary | ICD-10-CM | POA: Diagnosis not present

## 2024-04-27 DIAGNOSIS — G40909 Epilepsy, unspecified, not intractable, without status epilepticus: Secondary | ICD-10-CM | POA: Diagnosis not present

## 2024-04-27 DIAGNOSIS — E870 Hyperosmolality and hypernatremia: Secondary | ICD-10-CM

## 2024-04-27 DIAGNOSIS — N179 Acute kidney failure, unspecified: Secondary | ICD-10-CM

## 2024-04-27 DIAGNOSIS — J690 Pneumonitis due to inhalation of food and vomit: Secondary | ICD-10-CM | POA: Diagnosis not present

## 2024-04-27 DIAGNOSIS — J9602 Acute respiratory failure with hypercapnia: Secondary | ICD-10-CM | POA: Diagnosis not present

## 2024-04-27 LAB — GLUCOSE, CAPILLARY
Glucose-Capillary: 103 mg/dL — ABNORMAL HIGH (ref 70–99)
Glucose-Capillary: 104 mg/dL — ABNORMAL HIGH (ref 70–99)
Glucose-Capillary: 114 mg/dL — ABNORMAL HIGH (ref 70–99)
Glucose-Capillary: 91 mg/dL (ref 70–99)
Glucose-Capillary: 93 mg/dL (ref 70–99)
Glucose-Capillary: 96 mg/dL (ref 70–99)

## 2024-04-27 LAB — BASIC METABOLIC PANEL WITH GFR
Anion gap: 11 (ref 5–15)
Anion gap: 8 (ref 5–15)
BUN: 22 mg/dL — ABNORMAL HIGH (ref 6–20)
BUN: 17 mg/dL (ref 6–20)
CO2: 22 mmol/L (ref 22–32)
CO2: 24 mmol/L (ref 22–32)
Calcium: 7.7 mg/dL — ABNORMAL LOW (ref 8.9–10.3)
Calcium: 7.2 mg/dL — ABNORMAL LOW (ref 8.9–10.3)
Chloride: 116 mmol/L — ABNORMAL HIGH (ref 98–111)
Chloride: 118 mmol/L — ABNORMAL HIGH (ref 98–111)
Creatinine, Ser: 1.58 mg/dL — ABNORMAL HIGH (ref 0.44–1.00)
Creatinine, Ser: 0.7 mg/dL (ref 0.44–1.00)
GFR, Estimated: 44 mL/min — ABNORMAL LOW (ref >60)
GFR, Estimated: >60 mL/min (ref >60)
Glucose, Bld: 96 mg/dL (ref 70–99)
Glucose, Bld: 135 mg/dL — ABNORMAL HIGH (ref 70–99)
Potassium: 3.5 mmol/L (ref 3.5–5.1)
Potassium: 3.5 mmol/L (ref 3.5–5.1)
Sodium: 149 mmol/L — ABNORMAL HIGH (ref 135–145)
Sodium: 150 mmol/L — ABNORMAL HIGH (ref 135–145)

## 2024-04-27 LAB — CBC
HCT: 30.2 % — ABNORMAL LOW (ref 36.0–46.0)
Hemoglobin: 10 g/dL — ABNORMAL LOW (ref 12.0–15.0)
MCH: 29.3 pg (ref 26.0–34.0)
MCHC: 33.1 g/dL (ref 30.0–36.0)
MCV: 88.6 fL (ref 80.0–100.0)
Platelets: 247 K/uL (ref 150–400)
RBC: 3.41 MIL/uL — ABNORMAL LOW (ref 3.87–5.11)
RDW: 16.1 % — ABNORMAL HIGH (ref 11.5–15.5)
WBC: 13.8 K/uL — ABNORMAL HIGH (ref 4.0–10.5)
nRBC: 0 % (ref 0.0–0.2)

## 2024-04-27 LAB — TRIGLYCERIDES: Triglycerides: 308 mg/dL — ABNORMAL HIGH (ref <150)

## 2024-04-27 LAB — CK: Total CK: 373 U/L — ABNORMAL HIGH (ref 38–234)

## 2024-04-27 LAB — MAGNESIUM: Magnesium: 2.2 mg/dL (ref 1.7–2.4)

## 2024-04-27 LAB — PHOSPHORUS: Phosphorus: 3.5 mg/dL (ref 2.5–4.6)

## 2024-04-27 MED ORDER — ACETAMINOPHEN 325 MG PO TABS
650.0000 mg | ORAL_TABLET | Freq: Three times a day (TID) | ORAL | Status: DC | PRN
  Administered 2024-04-27 – 2024-05-06 (×9): 650 mg
  Filled 2024-04-27 (×11): qty 2

## 2024-04-27 MED ORDER — QUETIAPINE FUMARATE 100 MG PO TABS
100.0000 mg | ORAL_TABLET | Freq: Two times a day (BID) | ORAL | Status: DC
  Administered 2024-04-27 – 2024-04-28 (×4): 100 mg
  Filled 2024-04-27 (×5): qty 1

## 2024-04-27 MED ORDER — CLONAZEPAM 1 MG PO TABS
1.5000 mg | ORAL_TABLET | Freq: Two times a day (BID) | ORAL | Status: DC
  Administered 2024-04-27 – 2024-04-29 (×5): 1.5 mg
  Filled 2024-04-27 (×5): qty 1

## 2024-04-27 MED ORDER — FREE WATER
200.0000 mL | Status: DC
  Administered 2024-04-27 – 2024-04-30 (×19): 200 mL

## 2024-04-27 NOTE — Plan of Care (Signed)
  Problem: Metabolic: Goal: Ability to maintain appropriate glucose levels will improve Outcome: Progressing   Problem: Nutritional: Goal: Maintenance of adequate nutrition will improve Outcome: Progressing Goal: Progress toward achieving an optimal weight will improve Outcome: Progressing   Problem: Skin Integrity: Goal: Risk for impaired skin integrity will decrease Outcome: Progressing   Problem: Tissue Perfusion: Goal: Adequacy of tissue perfusion will improve Outcome: Progressing

## 2024-04-27 NOTE — Plan of Care (Signed)
  Problem: Education: Goal: Ability to describe self-care measures that may prevent or decrease complications (Diabetes Survival Skills Education) will improve Outcome: Progressing Goal: Individualized Educational Video(s) Outcome: Progressing   Problem: Coping: Goal: Ability to adjust to condition or change in health will improve Outcome: Progressing   Problem: Fluid Volume: Goal: Ability to maintain a balanced intake and output will improve Outcome: Progressing   Problem: Health Behavior/Discharge Planning: Goal: Ability to identify and utilize available resources and services will improve Outcome: Progressing Goal: Ability to manage health-related needs will improve Outcome: Progressing   Problem: Metabolic: Goal: Ability to maintain appropriate glucose levels will improve Outcome: Progressing   Problem: Nutritional: Goal: Maintenance of adequate nutrition will improve Outcome: Progressing Goal: Progress toward achieving an optimal weight will improve Outcome: Progressing   Problem: Skin Integrity: Goal: Risk for impaired skin integrity will decrease Outcome: Progressing   Problem: Tissue Perfusion: Goal: Adequacy of tissue perfusion will improve Outcome: Progressing   Problem: Education: Goal: Knowledge of General Education information will improve Description: Including pain rating scale, medication(s)/side effects and non-pharmacologic comfort measures Outcome: Progressing   Problem: Health Behavior/Discharge Planning: Goal: Ability to manage health-related needs will improve Outcome: Progressing   Problem: Clinical Measurements: Goal: Ability to maintain clinical measurements within normal limits will improve Outcome: Progressing Goal: Will remain free from infection Outcome: Progressing Goal: Diagnostic test results will improve Outcome: Progressing Goal: Respiratory complications will improve Outcome: Progressing Goal: Cardiovascular complication will  be avoided Outcome: Progressing   Problem: Activity: Goal: Risk for activity intolerance will decrease Outcome: Progressing   Problem: Nutrition: Goal: Adequate nutrition will be maintained Outcome: Progressing   Problem: Coping: Goal: Level of anxiety will decrease Outcome: Progressing   Problem: Elimination: Goal: Will not experience complications related to bowel motility Outcome: Progressing Goal: Will not experience complications related to urinary retention Outcome: Progressing   Problem: Pain Managment: Goal: General experience of comfort will improve and/or be controlled Outcome: Progressing   Problem: Safety: Goal: Ability to remain free from injury will improve Outcome: Progressing   Problem: Skin Integrity: Goal: Risk for impaired skin integrity will decrease Outcome: Progressing   Problem: Safety: Goal: Non-violent Restraint(s) Outcome: Progressing   Problem: Education: Goal: Knowledge of the prescribed therapeutic regimen will improve Outcome: Progressing Goal: Understanding of sexual limitations or changes related to disease process or condition will improve Outcome: Progressing Goal: Individualized Educational Video(s) Outcome: Progressing   Problem: Self-Concept: Goal: Communication of feelings regarding changes in body function or appearance will improve Outcome: Progressing   Problem: Skin Integrity: Goal: Demonstration of wound healing without infection will improve Outcome: Progressing   Problem: Education: Goal: Knowledge of condition will improve Outcome: Progressing Goal: Individualized Educational Video(s) Outcome: Progressing Goal: Individualized Newborn Educational Video(s) Outcome: Progressing   Problem: Activity: Goal: Will verbalize the importance of balancing activity with adequate rest periods Outcome: Progressing Goal: Ability to tolerate increased activity will improve Outcome: Progressing   Problem: Coping: Goal:  Ability to identify and utilize available resources and services will improve Outcome: Progressing   Problem: Life Cycle: Goal: Chance of risk for complications during the postpartum period will decrease Outcome: Progressing   Problem: Role Relationship: Goal: Ability to demonstrate positive interaction with newborn will improve Outcome: Progressing   Problem: Skin Integrity: Goal: Demonstration of wound healing without infection will improve Outcome: Progressing

## 2024-04-27 NOTE — Progress Notes (Signed)
 Subjective: Postpartum Day 4: Cesarean Delivery Patient reports patient is sedated and intubated.    Objective: Vital signs in last 24 hours: Temp:  [98 F (36.7 C)-99.4 F (37.4 C)] 99.4 F (37.4 C) (11/06 1600) Pulse Rate:  [79-157] 116 (11/06 1707) Resp:  [8-37] 12 (11/06 1707) BP: (95-146)/(48-112) 133/72 (11/06 1700) SpO2:  [96 %-100 %] 98 % (11/06 1707) FiO2 (%):  [40 %] 40 % (11/06 1600) Weight:  [67.8 kg] 67.8 kg (11/06 0500)  Physical Exam:  General: sedated and unresponsive to voice Lochia: appropriate Uterine Fundus: firm Incision: healing well, no significant drainage DVT Evaluation: No evidence of DVT seen on physical exam.  Recent Labs    04/26/24 0524 04/27/24 0536  HGB 9.4* 10.0*  HCT 28.3* 30.2*    Assessment/Plan: Status post Cesarean section. Postoperative course complicated by seizure d/o in ICU  Will follow.  Lynwood Solomons, MD 04/27/2024, 5:15 PM

## 2024-04-27 NOTE — Procedures (Signed)
 Patient Name: Colleen Pearson  MRN: [id number]  Epilepsy Attending: Arlin MALVA Krebs  Referring Physician/Provider: Michaela Aisha SQUIBB, MD  Duration: 04/26/2024 1030 to 04/27/2024 1030   Patient history: 33 y.o. female past history of seizure, polysubstance abuse who is [redacted] weeks pregnant, brought in for breakthrough seizure. EEG to evaluate for seizure   Level of alertness: comatose/ lethargic    AEDs during EEG study: LEV, LTG, Clonazepam, Propofol    Technical aspects: This EEG study was done with scalp electrodes positioned according to the 10-20 International system of electrode placement. Electrical activity was reviewed with band pass filter of 1-70Hz , sensitivity of 7 uV/mm, display speed of 16mm/sec with a 60Hz  notched filter applied as appropriate. EEG data were recorded continuously and digitally stored.  Video monitoring was available and reviewed as appropriate.   Description: EEG showed continuous generalized 3 to 6 Hz theta-delta slowing. Hyperventilation and photic stimulation were not performed.     ABNORMALITY - Continuous slow, generalized   IMPRESSION: This study is suggestive of diffuse cerebral dysfunction (encephalopathy) likely related to sedation. No seizures or epileptiform discharges were seen throughout the recording.   Halen Mossbarger O Jeanluc Wegman

## 2024-04-27 NOTE — TOC Progression Note (Signed)
 Transition of Care [facility]) - Progression Note    Patient Details  Name: Colleen Pearson MRN: [id number] Date of Birth: 1991-03-02  Transition of Care [facility]) CM/SW Contact  Lendia Dais, [place] Phone Number: 04/27/2024, 4:30 PM  Clinical Narrative:  Pt is not yet medically stable. Pt is unable to follow commands at the moment and is intubated.   TOC will continue to follow.                      Expected Discharge Plan and Services                                               Social Drivers of Health (SDOH) Interventions SDOH Screenings   Food Insecurity: No Food Insecurity (12/27/2023)  Housing: Low Risk  (12/27/2023)  Transportation Needs: No Transportation Needs (12/27/2023)  Utilities: Not At Risk (12/27/2023)  Alcohol Screen: Low Risk  (12/27/2023)  Depression (PHQ2-9): Low Risk  (12/27/2023)  Financial Resource Strain: Medium Risk (12/27/2023)  Physical Activity: Inactive (12/27/2023)  Social Connections: Unknown (12/27/2023)  Stress: No Stress Concern Present (12/27/2023)  Tobacco Use: Medium Risk (04/23/2024)  Health Literacy: Adequate Health Literacy (12/27/2023)    Readmission Risk Interventions    03/03/2023    3:16 PM  Readmission Risk Prevention Plan  Medication Screening Complete  Transportation Screening Complete

## 2024-04-27 NOTE — Progress Notes (Signed)
   04/27/24 0734  Daily Weaning Assessment  Daily Assessment of Readiness to Wean Wean protocol criteria met (SBT performed)  SBT Method CPAP 5 cm H20 and PS 5 cm H20  Weaning Start Time 0734  Patient response Passed (Tolerated well)

## 2024-04-27 NOTE — Progress Notes (Signed)
 eLink Physician-Brief Progress Note Patient Name: Colleen Pearson DOB: 08/10/1990 MRN: [id number]   Date of Service  04/27/2024  HPI/Events of Note  33 y/o female with h/o Epilepsy and Polysubstance d/o who is a [id number] and is currently [redacted] weeks pregnant who was on the cough and then have several seizures.  Ongoing agitation previously restrained  eICU Interventions  Renew previous restraints for patient safety        Ryleigh Esqueda 04/27/2024, 8:01 PM

## 2024-04-27 NOTE — Progress Notes (Signed)
 LTM maint complete - serviced P8 P3 P4 C4 and Pz  [facility] monitored, Event button test confirmed by [facility].

## 2024-04-27 NOTE — Progress Notes (Signed)
 NAME:  Colleen Pearson, MRN:  [id number], DOB:  1990/11/17, LOS: 4 ADMISSION DATE:  04/23/2024, CONSULTATION DATE:  04/22/2024 REFERRING MD:  Oneil Budge, MD, CHIEF COMPLAINT:  Status Epilepticus   History of Present Illness:  33 y/o female with h/o Epilepsy and Polysubstance d/o who is a [phone] and is currently [redacted] weeks pregnant who was on the cough and then have several seizures.  EMS gave 4mg  IV Magnesium  and 5mg  IV Midazolam .  She was lethargic with secretions not able to protect her airways and she was intubated in the ED.  She has been started on Versed  drip.  She was loaded with Keppra .  There has been reports of non-compliance with her seizure meds-Keppra  and Lamictal .  She also had low levels as outpatient.  She has seizures in September as well, similar situation and apparently has a seizure two weeks prior to that ED visit.  Pertinent  Medical History  Epilepsy and Polysubstance d/o, CF carrier  Significant Hospital Events: Including procedures, antibiotic start and stop dates in addition to other pertinent events   11/1: transfer to [facility] from [facility] 11/2 admitted to ICU intubated and sedated signs of fetal distress resulting in emergent C-section 11/3 remains intubated deeply sedated on LTM 11/4 sedation lightened > agitation  Interim History / Subjective:  Significant agitation 11/5-11/6 with any lightening of sedation.  She did not tolerate being off propofol  for very long even on Precedex  Added clonazepam, Seroquel  on 11/5 Propofol  30, fentanyl  75 Precedex  0.3 0.40+ PEEP 5 I/O+ 3.3 L total, UOP 650 cc / 24 hours No seizures on her continuous EEG 11/5, 11/6   Objective    Blood pressure 132/89, pulse (!) 108, temperature 99.4 F (37.4 C), temperature source Axillary, resp. rate 16, weight 67.8 kg, SpO2 98%, unknown if currently breastfeeding.    Vent Mode: PRVC FiO2 (%):  [40 %] 40 % Set Rate:  [18 bmp] 18 bmp Vt Set:  [350 mL-380 mL] 380 mL PEEP:  [5 cmH20] 5  cmH20 Pressure Support:  [5 cmH20] 5 cmH20 Plateau Pressure:  [20 cmH20] 20 cmH20   Intake/Output Summary (Last 24 hours) at 04/27/2024 0859 Last data filed at 04/27/2024 0723 Gross per 24 hour  Intake 1942.51 ml  Output 650 ml  Net 1292.51 ml   Filed Weights   04/25/24 0500 04/26/24 0708 04/27/24 0500  Weight: 62.9 kg 63 kg 67.8 kg    Examination:  General: Ill-appearing woman on mechanical ventilation.  Currently calm HEENT: ET tube in place, pupils equal, oropharynx otherwise clear Neuro: Easily agitated with stimulation.  Does not track, does open eyes.  Spontaneous movement of all extremities with good strength.  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  Added clonazepam and Seroquel  on 11/5, may uptitrate on 11/6 - Will need substance abuse cessation counseling when feasible to do so  S/p emergent C-section at 32 weeks  - Fetal distress noticed overnight of admission resulting in decision for emergent  C-section P: - Appreciate obstetric care by OB - Follow for any evidence uterine bleeding  Acute hypoxic and hypercarbic respiratory failure Aspiration PNA Progressive pulmonary infiltrates 11/6 P: -Pushing for SBT but agitated delirium is a significant barrier.  Will work to wean sedated medication as able but she has required reinitiation of propofol .  May be able to work her back to Precedex  with the addition of Seroquel , clonazepam. - Remains at risk for acute lung injury, ventilator needs have not changed but chest x-ray with some progression of interstitial infiltrates.  Will continue to follow imaging - Plan to complete 5-day course of Unasyn on 11/6 - Empiric steroids discontinued on 11/5 - Follow chest x-ray  Acute renal failure Hyperkalemia, resolved Hypernatremia P: -Significant bump in her serum creatinine 11/6 associated with hypernatremia - Continue avoid nephrotoxins, ensure adequate renal perfusion - Start free water  flushes  Leukocytosis: steroids, pneumonia, acute phase reactant -Following trend, improving  Signature   Critical care time 34 minutes  Lamar Chris, MD, PhD 04/27/2024, 8:59 AM [facility] [phone] or if no answer before 7:00PM call [phone] For any issues after 7:00PM please call eLink [phone]

## 2024-04-28 ENCOUNTER — Inpatient Hospital Stay (HOSPITAL_COMMUNITY)

## 2024-04-28 DIAGNOSIS — G40901 Epilepsy, unspecified, not intractable, with status epilepticus: Secondary | ICD-10-CM | POA: Diagnosis not present

## 2024-04-28 DIAGNOSIS — J9601 Acute respiratory failure with hypoxia: Secondary | ICD-10-CM | POA: Diagnosis not present

## 2024-04-28 DIAGNOSIS — D62 Acute posthemorrhagic anemia: Secondary | ICD-10-CM

## 2024-04-28 DIAGNOSIS — J9602 Acute respiratory failure with hypercapnia: Secondary | ICD-10-CM | POA: Diagnosis not present

## 2024-04-28 DIAGNOSIS — J690 Pneumonitis due to inhalation of food and vomit: Secondary | ICD-10-CM | POA: Diagnosis not present

## 2024-04-28 LAB — CBC
HCT: 29.4 % — ABNORMAL LOW (ref 36.0–46.0)
Hemoglobin: 9.5 g/dL — ABNORMAL LOW (ref 12.0–15.0)
MCH: 29 pg (ref 26.0–34.0)
MCHC: 32.3 g/dL (ref 30.0–36.0)
MCV: 89.6 fL (ref 80.0–100.0)
Platelets: 231 K/uL (ref 150–400)
RBC: 3.28 MIL/uL — ABNORMAL LOW (ref 3.87–5.11)
RDW: 15.9 % — ABNORMAL HIGH (ref 11.5–15.5)
WBC: 11.8 K/uL — ABNORMAL HIGH (ref 4.0–10.5)
nRBC: 0 % (ref 0.0–0.2)

## 2024-04-28 LAB — BASIC METABOLIC PANEL WITH GFR
Anion gap: 10 (ref 5–15)
BUN: 11 mg/dL (ref 6–20)
CO2: 24 mmol/L (ref 22–32)
Calcium: 7.5 mg/dL — ABNORMAL LOW (ref 8.9–10.3)
Chloride: 112 mmol/L — ABNORMAL HIGH (ref 98–111)
Creatinine, Ser: 0.66 mg/dL (ref 0.44–1.00)
GFR, Estimated: >60 mL/min (ref >60)
Glucose, Bld: 109 mg/dL — ABNORMAL HIGH (ref 70–99)
Potassium: 3.5 mmol/L (ref 3.5–5.1)
Sodium: 146 mmol/L — ABNORMAL HIGH (ref 135–145)

## 2024-04-28 LAB — PHOSPHORUS: Phosphorus: 3.7 mg/dL (ref 2.5–4.6)

## 2024-04-28 LAB — MAGNESIUM: Magnesium: 2.1 mg/dL (ref 1.7–2.4)

## 2024-04-28 LAB — CULTURE, BLOOD (ROUTINE X 2)
Culture: NO GROWTH
Culture: NO GROWTH

## 2024-04-28 LAB — GLUCOSE, CAPILLARY
Glucose-Capillary: 100 mg/dL — ABNORMAL HIGH (ref 70–99)
Glucose-Capillary: 78 mg/dL (ref 70–99)
Glucose-Capillary: 90 mg/dL (ref 70–99)
Glucose-Capillary: 115 mg/dL — ABNORMAL HIGH (ref 70–99)
Glucose-Capillary: 85 mg/dL (ref 70–99)

## 2024-04-28 MED ORDER — DOCUSATE SODIUM 50 MG/5ML PO LIQD
100.0000 mg | Freq: Two times a day (BID) | ORAL | Status: DC
  Administered 2024-04-28 – 2024-05-01 (×8): 100 mg
  Filled 2024-04-28 (×8): qty 10

## 2024-04-28 MED ORDER — POTASSIUM CHLORIDE 20 MEQ PO PACK
40.0000 meq | PACK | Freq: Once | ORAL | Status: AC
  Administered 2024-04-28: 40 meq
  Filled 2024-04-28: qty 2

## 2024-04-28 MED ORDER — SENNA 8.6 MG PO TABS
2.0000 | ORAL_TABLET | Freq: Every day | ORAL | Status: DC
  Administered 2024-04-28 – 2024-04-30 (×3): 17.2 mg
  Filled 2024-04-28 (×3): qty 2

## 2024-04-28 MED ORDER — POLYETHYLENE GLYCOL 3350 17 G PO PACK
17.0000 g | PACK | Freq: Every day | ORAL | Status: DC
  Administered 2024-04-28 – 2024-05-01 (×4): 17 g
  Filled 2024-04-28 (×4): qty 1

## 2024-04-28 NOTE — Progress Notes (Signed)
 Nutrition Follow-up  DOCUMENTATION CODES:  Not applicable  INTERVENTION:  Continue tube feeds via OGT: Osmolite 1.5 at 45 ml/h (1080 ml per day) Free water  flushes 200ml q4h per CCM ( total daily) Provides 1620 kcal, 67 gm protein, 822 ml free water  daily (2022ml total daily- TF + FWF)  NUTRITION DIAGNOSIS:  Inadequate oral intake related to inability to eat as evidenced by NPO status. - remains applicable  GOAL:  Patient will meet greater than or equal to 90% of their needs - met via TF  MONITOR:  Diet advancement, Vent status  REASON FOR ASSESSMENT:  Consult Assessment of nutrition requirement/status  ASSESSMENT:  Pt with hx of epilepsy and polysubstance disorder who underwent emergent C-section at 32 weeks due to several seizures that led to fetal distress. Pt admitted from [facility] and placed on ventilator.  11/1 transfer from [facility] 11/2 admitted to ICU, intubated, emergent C-section   Remains intubated on vent support.   No seizures noted on continuous EEG.  Continues trials of SBT. Delirium and agitation remain barrier to extubation.   Remains constipated. Notably has been receiving pain medications.  Bowel regimen initiated/escalated today.   Tolerating tube feeds via OGT at goal rate.  Sodium trending up. Free water  flushes added   Admit weight: 70.8 kg  Current weight: 69.8 kg **pt had emergent C section 11/2**  Medications: colace BID, miralax  daily, senna daily, thiamine Drips: Propofol  @ 10.14ml/hr   Labs:  Sodium 146 CBG's 93-114 x24 hours  Diet Order:   Diet Order             Diet NPO time specified  Diet effective now                   EDUCATION NEEDS:   Not appropriate for education at this time  Skin:  Skin Assessment: Skin Integrity Issues: Skin Integrity Issues:: Incisions Incisions: abdomen and perineum  Last BM:  unknown  Height:   Ht Readings from Last 1 Encounters:  04/28/24 5' 0.98 (1.549 m)    Weight:    Wt Readings from Last 1 Encounters:  04/28/24 69.8 kg   BMI:  Body mass index is 29.09 kg/m.  Estimated Nutritional Needs:   Kcal:  1600-1800  Protein:  65-85g  Fluid:  1.6-1.8L  Colleen Pearson, RDN, LDN Clinical Nutrition See AMiON for contact information.

## 2024-04-28 NOTE — Progress Notes (Addendum)
 Pharmacy ICU Bowel Regimen Consult Note   Current Inpatient Medications for Bowel Management:  N/a  Assessment: Colleen Pearson is a 33 y.o. year old female admitted on 04/23/2024. Constipation identified as acute opioid-induced constipation  (on fentanyl  infusion since 11/2). Bowel regimen assessment completed by Maggie RN on 11/7. LBM prior to admission. Tube feeds started 11/5 PM.   [x]  Bowel sounds present (hypoactive) [x]  No abdominal tenderness  []  Passing gas   Plan: Start docusate BID, senna 2 tabs at bedtime, miralax  in the AM  Thank you for allowing pharmacy to participate in this patient's care.  Rankin Sams 04/28/2024,8:08 AM

## 2024-04-28 NOTE — Progress Notes (Addendum)
 NAME:  Colleen Pearson, MRN:  [id number], DOB:  [birth year], LOS: 5 ADMISSION DATE:  04/23/2024, CONSULTATION DATE:  04/22/2024 REFERRING MD:  Oneil Budge, MD, CHIEF COMPLAINT:  Status Epilepticus   History of Present Illness:  33 y/o female with h/o Epilepsy and Polysubstance d/o who is a [phone] and is currently [redacted] weeks pregnant who was on the cough and then have several seizures.  EMS gave 4mg  IV Magnesium  and 5mg  IV Midazolam .  She was lethargic with secretions not able to protect her airways and she was intubated in the ED.  She has been started on Versed  drip.  She was loaded with Keppra .  There has been reports of non-compliance with her seizure meds-Keppra  and Lamictal .  She also had low levels as outpatient.  She has seizures in September as well, similar situation and apparently has a seizure two weeks prior to that ED visit.  Pertinent  Medical History  Epilepsy and Polysubstance d/o, CF carrier  Significant Hospital Events: Including procedures, antibiotic start and stop dates in addition to other pertinent events   11/1: transfer to [facility] from [facility] 11/2 admitted to ICU intubated and sedated signs of fetal distress resulting in emergent C-section 11/3 remains intubated deeply sedated on LTM 11/4 sedation lightened > agitation, back to propofol   Interim History / Subjective:  Clonazepam, Seroquel  uptitrated on 11/6 Propofol  30, fentanyl  100 0.40+ PEEP 5 I/O+ 5.5 L total, UOP 775 cc / 24 hours Transient elevation serum creatinine 11/6, now normalized 0.66 Sodium 146, free water  started on 11/6 Tmax 101.4   Objective    Blood pressure (!) 140/70, pulse 90, temperature 100.2 F (37.9 C), temperature source Axillary, resp. rate 13, height 5' 0.98 (1.549 m), weight 69.8 kg, SpO2 100%, unknown if currently breastfeeding.    Vent Mode: PRVC FiO2 (%):  [40 %] 40 % Set Rate:  [18 bmp] 18 bmp Vt Set:  [380 mL] 380 mL PEEP:  [5 cmH20] 5 cmH20 Pressure Support:  [5 cmH20] 5  cmH20 Plateau Pressure:  [22 cmH20-23 cmH20] 22 cmH20   Intake/Output Summary (Last 24 hours) at 04/28/2024 0731 Last data filed at 04/28/2024 0600 Gross per 24 hour  Intake 3035.42 ml  Output 775 ml  Net 2260.42 ml   Filed Weights   04/26/24 0708 04/27/24 0500 04/28/24 0500  Weight: 63 kg 67.8 kg 69.8 kg    Examination:  General: Chronically ill-appearing woman, ventilated.  Currently calm HEENT: ET tube in good position, oropharynx clear Neuro: Calm currently on fentanyl  and propofol  but easily agitated.  She does not track, follow commands.  Moves all extremities with good strength. 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  Plan to continue Keppra  1500 mg twice daily while intubated, then decrease to 1000 mg twice daily post extubation - No seizures noted on continuous EEG, duration of monitoring to be determined by neurology - Agitated delirium an issue, due to her alcohol/substance abuse and withdrawal.  Uptitrating clonazepam and Seroquel .  Will work to wean propofol , possibly attempt another transition over to Precedex  now that longer acting sedating meds are on board - Will need substance abuse cessation counseling when feasible to do so  S/p emergent C-section at 32 weeks  - Fetal distress noticed overnight of admission resulting in decision for emergent  C-section P: -Appreciate obstetric care by [facility] -Following for any evidence uterine bleeding  Acute hypoxic and hypercarbic respiratory failure Aspiration PNA Progressive pulmonary infiltrates 11/6, at risk ILI P: -Some progression of her pulmonary infiltrates although remains on 0.40, PEEP 5.  Would like to continue to push for SBT.  The other significant barrier is her agitated delirium, working to optimize sedation as above - Continue to follow chest x-ray - Completed 5-day course Unasyn.  Would reculture if she shows evidence for infection - Empiric steroids discontinued on 11/5 - Depending on course and predicted duration of MV then we may need to begin to consider tracheostomy  Acute renal failure, improved Hyperkalemia, resolved Hypernatremia P: -Significant bump in her serum creatinine 11/6 associated with hypernatremia.  Unclear whether this was a spurious lab - Continue to avoid nephrotoxins, ensure adequate renal perfusion - Continue free water  flushes at current dose  Leukocytosis, fever on 11/6 - Unasyn completed 11/6 - Reculture if she has recurrent fever  Acute blood loss anemia, post-op - following CBC  Signature   Critical care time 33 minutes  Lamar Chris, MD, PhD 04/28/2024, 7:31 AM [facility] [facility] [phone] or if no answer before 7:00PM call [phone] For any issues after 7:00PM please call eLink [phone]

## 2024-04-28 NOTE — Progress Notes (Signed)
 [facility] ADULT ICU REPLACEMENT PROTOCOL   The patient does apply for the [facility] Adult ICU Electrolyte Replacment Protocol based on the criteria listed below:   1.Exclusion criteria: TCTS, ECMO, Dialysis, and Myasthenia Gravis patients 2. Is GFR >/= 30 ml/min? Yes.    Patient's GFR today is >60 3. Is SCr </= 2? Yes.   Patient's SCr is 0.66 mg/dL 4. Did SCr increase >/= 0.5 in 24 hours? No. 5.Pt's weight >40kg  Yes.   6. Abnormal electrolyte(s): K+ 3.5  7. Electrolytes replaced per protocol 8.  Call MD STAT for K+ </= 2.5, Phos </= 1, or Mag </= 1 Physician:  Haze Ole BIRCH [facility] 04/28/2024 6:57 AM

## 2024-04-28 NOTE — Plan of Care (Signed)
  Problem: Fluid Volume: Goal: Ability to maintain a balanced intake and output will improve Outcome: Progressing   Problem: Metabolic: Goal: Ability to maintain appropriate glucose levels will improve Outcome: Progressing   Problem: Nutritional: Goal: Maintenance of adequate nutrition will improve Outcome: Progressing Goal: Progress toward achieving an optimal weight will improve Outcome: Progressing   Problem: Skin Integrity: Goal: Risk for impaired skin integrity will decrease Outcome: Progressing

## 2024-04-28 NOTE — Plan of Care (Signed)
  Problem: Education: Goal: Ability to describe self-care measures that may prevent or decrease complications (Diabetes Survival Skills Education) will improve Outcome: Progressing Goal: Individualized Educational Video(s) Outcome: Progressing   Problem: Coping: Goal: Ability to adjust to condition or change in health will improve Outcome: Progressing   Problem: Fluid Volume: Goal: Ability to maintain a balanced intake and output will improve Outcome: Progressing   Problem: Health Behavior/Discharge Planning: Goal: Ability to identify and utilize available resources and services will improve Outcome: Progressing Goal: Ability to manage health-related needs will improve Outcome: Progressing   Problem: Metabolic: Goal: Ability to maintain appropriate glucose levels will improve Outcome: Progressing   Problem: Nutritional: Goal: Maintenance of adequate nutrition will improve Outcome: Progressing Goal: Progress toward achieving an optimal weight will improve Outcome: Progressing   Problem: Skin Integrity: Goal: Risk for impaired skin integrity will decrease Outcome: Progressing   Problem: Tissue Perfusion: Goal: Adequacy of tissue perfusion will improve Outcome: Progressing   Problem: Education: Goal: Knowledge of General Education information will improve Description: Including pain rating scale, medication(s)/side effects and non-pharmacologic comfort measures Outcome: Progressing   Problem: Health Behavior/Discharge Planning: Goal: Ability to manage health-related needs will improve Outcome: Progressing   Problem: Clinical Measurements: Goal: Ability to maintain clinical measurements within normal limits will improve Outcome: Progressing Goal: Will remain free from infection Outcome: Progressing Goal: Diagnostic test results will improve Outcome: Progressing Goal: Respiratory complications will improve Outcome: Progressing Goal: Cardiovascular complication will  be avoided Outcome: Progressing   Problem: Activity: Goal: Risk for activity intolerance will decrease Outcome: Progressing   Problem: Nutrition: Goal: Adequate nutrition will be maintained Outcome: Progressing   Problem: Coping: Goal: Level of anxiety will decrease Outcome: Progressing   Problem: Elimination: Goal: Will not experience complications related to bowel motility Outcome: Progressing Goal: Will not experience complications related to urinary retention Outcome: Progressing   Problem: Pain Managment: Goal: General experience of comfort will improve and/or be controlled Outcome: Progressing   Problem: Safety: Goal: Ability to remain free from injury will improve Outcome: Progressing   Problem: Skin Integrity: Goal: Risk for impaired skin integrity will decrease Outcome: Progressing   Problem: Safety: Goal: Non-violent Restraint(s) Outcome: Progressing   Problem: Education: Goal: Knowledge of the prescribed therapeutic regimen will improve Outcome: Progressing Goal: Understanding of sexual limitations or changes related to disease process or condition will improve Outcome: Progressing Goal: Individualized Educational Video(s) Outcome: Progressing   Problem: Self-Concept: Goal: Communication of feelings regarding changes in body function or appearance will improve Outcome: Progressing   Problem: Skin Integrity: Goal: Demonstration of wound healing without infection will improve Outcome: Progressing   Problem: Education: Goal: Knowledge of condition will improve Outcome: Progressing Goal: Individualized Educational Video(s) Outcome: Progressing Goal: Individualized Newborn Educational Video(s) Outcome: Progressing   Problem: Activity: Goal: Will verbalize the importance of balancing activity with adequate rest periods Outcome: Progressing Goal: Ability to tolerate increased activity will improve Outcome: Progressing   Problem: Coping: Goal:  Ability to identify and utilize available resources and services will improve Outcome: Progressing   Problem: Life Cycle: Goal: Chance of risk for complications during the postpartum period will decrease Outcome: Progressing   Problem: Role Relationship: Goal: Ability to demonstrate positive interaction with newborn will improve Outcome: Progressing   Problem: Skin Integrity: Goal: Demonstration of wound healing without infection will improve Outcome: Progressing

## 2024-04-28 NOTE — Evaluation (Signed)
 RT Evaluate and Treat Note  04/28/2024   Breathing is (select one): Worse than normal   The following was found on auscultation (select multiple):  Bilateral Breath Sounds: Rhonchi;Diminished (04/28/24 0737)  R Upper  Breath Sounds: Clear;Diminished (04/28/24 0324) L Upper Breath Sounds: Clear;Diminished (04/28/24 0324) R Lower Breath Sounds: Diminished (04/28/24 0324) L Lower Breath Sounds: Diminished (04/28/24 0324)    Cough Assessment: Cough: Productive (04/28/24 0737)    Most Recent Chest Xray:... (DG Chest Port 1 View Result Date: 04/28/2024 EXAM: 1 VIEW(S) XRAY OF THE CHEST 04/28/2024 05:40:00 AM COMPARISON: 04/27/2024 CLINICAL HISTORY: Respiratory failure (HCC) FINDINGS: LINES, TUBES AND DEVICES: Endotracheal tube in place with tip 2.1 cm above the carina. Enteric tube in place, courses below the diaphragm with distal tip in the expected location of the proximal stomach. Right internal jugular central venous catheter in place, terminates at the level of the right atrium. LUNGS AND PLEURA: Persistent bilateral interstitial and airspace opacities. No pulmonary edema. No pleural effusion. No pneumothorax. HEART AND MEDIASTINUM: No acute abnormality of the cardiac and mediastinal silhouettes. BONES AND SOFT TISSUES: No acute osseous abnormality. IMPRESSION: 1. Persistent bilateral interstitial and airspace opacities. Electronically signed by: Waddell Calk MD 04/28/2024 07:38 AM EST RP Workstation: [id number]      The following medications and/or interventions were ordered/changed/discontinued as part of the Respiratory Treatment protocol:   Medication Changes: no changes   Airway Clearance Changes: no changes   Oxygen Therapy Changes: no changes; remains on ventilator

## 2024-04-28 NOTE — Procedures (Addendum)
 Patient Name: Colleen Pearson  MRN: [id number]  Epilepsy Attending: Arlin MALVA Krebs  Referring Physician/Provider: Michaela Aisha SQUIBB, MD  Duration: 04/27/2024 1030 to 04/28/2024 1030   Patient history: 33 y.o. female past history of seizure, polysubstance abuse who is [redacted] weeks pregnant, brought in for breakthrough seizure. EEG to evaluate for seizure   Level of alertness: comatose/ lethargic    AEDs during EEG study: LEV, LTG, Clonazepam, Propofol    Technical aspects: This EEG study was done with scalp electrodes positioned according to the 10-20 International system of electrode placement. Electrical activity was reviewed with band pass filter of 1-70Hz , sensitivity of 7 uV/mm, display speed of 37mm/sec with a 60Hz  notched filter applied as appropriate. EEG data were recorded continuously and digitally stored.  Video monitoring was available and reviewed as appropriate.   Description: EEG showed continuous generalized 3 to 6 Hz theta-delta slowing. Hyperventilation and photic stimulation were not performed.     ABNORMALITY - Continuous slow, generalized   IMPRESSION: This study is suggestive of diffuse cerebral dysfunction (encephalopathy) likely related to sedation. No seizures or epileptiform discharges were seen throughout the recording.   Dudley Cooley O Renee Erb

## 2024-04-29 ENCOUNTER — Inpatient Hospital Stay (HOSPITAL_COMMUNITY)

## 2024-04-29 ENCOUNTER — Encounter (HOSPITAL_COMMUNITY): Payer: Self-pay | Admitting: Obstetrics

## 2024-04-29 DIAGNOSIS — E876 Hypokalemia: Secondary | ICD-10-CM

## 2024-04-29 LAB — GLUCOSE, CAPILLARY
Glucose-Capillary: 79 mg/dL (ref 70–99)
Glucose-Capillary: 69 mg/dL — ABNORMAL LOW (ref 70–99)
Glucose-Capillary: 81 mg/dL (ref 70–99)
Glucose-Capillary: 86 mg/dL (ref 70–99)
Glucose-Capillary: 89 mg/dL (ref 70–99)
Glucose-Capillary: 96 mg/dL (ref 70–99)

## 2024-04-29 LAB — BASIC METABOLIC PANEL WITH GFR
Anion gap: 9 (ref 5–15)
BUN: 11 mg/dL (ref 6–20)
CO2: 27 mmol/L (ref 22–32)
Calcium: 8 mg/dL — ABNORMAL LOW (ref 8.9–10.3)
Chloride: 110 mmol/L (ref 98–111)
Creatinine, Ser: 0.68 mg/dL (ref 0.44–1.00)
GFR, Estimated: >60 mL/min (ref >60)
Glucose, Bld: 92 mg/dL (ref 70–99)
Potassium: 3.9 mmol/L (ref 3.5–5.1)
Sodium: 146 mmol/L — ABNORMAL HIGH (ref 135–145)

## 2024-04-29 LAB — HEPATIC FUNCTION PANEL
ALT: 21 U/L (ref 0–44)
AST: 18 U/L (ref 15–41)
Albumin: <1.5 g/dL — ABNORMAL LOW (ref 3.5–5.0)
Alkaline Phosphatase: 76 U/L (ref 38–126)
Bilirubin, Direct: <0.1 mg/dL (ref 0.0–0.2)
Total Bilirubin: 0.2 mg/dL (ref 0.0–1.2)
Total Protein: 4.7 g/dL — ABNORMAL LOW (ref 6.5–8.1)

## 2024-04-29 LAB — AMMONIA: Ammonia: 35 umol/L (ref 9–35)

## 2024-04-29 MED ORDER — CLONAZEPAM 1 MG PO TABS
3.0000 mg | ORAL_TABLET | Freq: Two times a day (BID) | ORAL | Status: DC
  Administered 2024-04-29: 3 mg
  Filled 2024-04-29: qty 3

## 2024-04-29 MED ORDER — QUETIAPINE FUMARATE 100 MG PO TABS
200.0000 mg | ORAL_TABLET | Freq: Two times a day (BID) | ORAL | Status: DC
  Administered 2024-04-29 (×2): 200 mg
  Filled 2024-04-29 (×2): qty 2

## 2024-04-29 MED ORDER — THIAMINE MONONITRATE 100 MG PO TABS
200.0000 mg | ORAL_TABLET | Freq: Every day | ORAL | Status: DC
  Administered 2024-04-29: 200 mg
  Filled 2024-04-29: qty 2

## 2024-04-29 MED ORDER — STERILE WATER FOR INJECTION IJ SOLN
INTRAMUSCULAR | Status: AC
Start: 2024-04-29 — End: 2024-04-29
  Filled 2024-04-29: qty 10

## 2024-04-29 MED ORDER — ZIPRASIDONE MESYLATE 20 MG IM SOLR
10.0000 mg | INTRAMUSCULAR | Status: DC | PRN
Start: 2024-04-29 — End: 2024-04-29
  Filled 2024-04-29: qty 20

## 2024-04-29 NOTE — Progress Notes (Signed)
 NEUROLOGY CONSULT FOLLOW UP NOTE   Date of service: April 29, 2024 Patient Name: Colleen Pearson MRN:  [id number] DOB:  Nov 13, 1990  Interval Hx/subjective  Continues to be intubated. Sedated on propofol  at a rate of 20 and fentanyl  at a rate of 125.   Vitals   Vitals:   04/29/24 1127 04/29/24 1128 04/29/24 1200 04/29/24 1300  BP:   (!) 118/58 133/80  Pulse: (!) 111  (!) 114 (!) 126  Resp: 14  15 20   Temp:      TempSrc:      SpO2: 99% 99% 100% 99%  Weight:      Height:         Body mass index is 31.39 kg/m.  Physical Exam   General: Sedated, intubated  HEENT: Normocephalic. No nuchal rigidity.  CVS: Regular rate rhythm Respiratory: Vented   Neurological exam Ment: Sedated with propofol  (rate of 20) and fentanyl  (125). Slow withdrawals of BLE to noxious, with no antigravity movement. Eyes are less open today. Not responding to voice. No attempts to communicate. Does not fixate or track.   CN: Pupils are pinpoint bilaterally. No blink to threat. Absent oculocephalic reflex. Trace corneal reflexes bilaterally. Face flaccidly symmetric.  Motor/Sensory: Slow withdrawal of BLE to noxious without asymmetry. Tone decreased in BUE, with no movement to noxious Reflexes: 1+ bilateral brachioradialis. 2+ bilateral patellars.  Medications  Current Facility-Administered Medications:    acetaminophen  (TYLENOL ) tablet 650 mg, 650 mg, Per Tube, Q8H PRN, Byrum, Robert S, MD, 650 mg at 04/28/24 2354   Chlorhexidine  Gluconate Cloth 2 % PADS 6 each, 6 each, Topical, Daily, Arloa Folks D, NP, 6 each at 04/28/24 2050   clonazePAM (KLONOPIN) tablet 3 mg, 3 mg, Per Tube, BID, Sharie Bourbon, MD   coconut oil, 1 Application, Topical, PRN, Nicholaus Burnard HERO, MD, 1 Application at 04/28/24 2047   docusate (COLACE) 50 MG/5ML liquid 100 mg, 100 mg, Per Tube, BID, Byrum, Robert S, MD, 100 mg at 04/29/24 0941   famotidine  (PEPCID ) tablet 20 mg, 20 mg, Per Tube, BID, Maree Harder, MD, 20 mg at  04/29/24 0941   feeding supplement (OSMOLITE 1.5 CAL) liquid 1,000 mL, 1,000 mL, Per Tube, Continuous, Shelah Lamar RAMAN, MD, Last Rate: 45 mL/hr at 04/29/24 1300, Infusion Verify at 04/29/24 1300   fentaNYL  (SUBLIMAZE ) bolus via infusion 25-100 mcg, 25-100 mcg, Intravenous, Q15 min PRN, Claudene Chew C, NP, 50 mcg at 04/29/24 1320   fentaNYL  in NS (64mcg/ml) infusion-PREMIX, 0-400 mcg/hr, Intravenous, Continuous, Claudene Chew C, NP, Last Rate: 12.5 mL/hr at 04/29/24 1300, 125 mcg/hr at 04/29/24 1300   free water  200 mL, 200 mL, Per Tube, Q4H, Byrum, Robert S, MD, 200 mL at 04/29/24 1214   heparin  injection 5,000 Units, 5,000 Units, Subcutaneous, Q8H, Maree Harder, MD, 5,000 Units at 04/29/24 9486   lamoTRIgine  (LAMICTAL ) tablet 200 mg, 200 mg, Per Tube, BID, Michaela Aisha SQUIBB, MD, 200 mg at 04/29/24 9057   levETIRAcetam  (KEPPRA ) undiluted injection 1,500 mg, 1,500 mg, Intravenous, Q12H, Arora, Ashish, MD, 1,500 mg at 04/29/24 0816   ondansetron  (ZOFRAN ) injection 4 mg, 4 mg, Intravenous, Q6H PRN, Maree Harder, MD, 4 mg at 04/25/24 1834   Oral care mouth rinse, 15 mL, Mouth Rinse, Q2H, Maree Harder, MD, 15 mL at 04/29/24 1215   Oral care mouth rinse, 15 mL, Mouth Rinse, PRN, Maree Harder, MD, 15 mL at 04/28/24 0540   polyethylene glycol (MIRALAX  / GLYCOLAX ) packet 17 g, 17 g, Per Tube, Daily, Byrum,  Lamar RAMAN, MD, 17 g at 04/29/24 9057   propofol  (DIPRIVAN ) 1000 MG/100ML infusion, 0-80 mcg/kg/min, Intravenous, Titrated, Mallory Sheela Kieth CHRISTELLA, MD, Last Rate: 4.25 mL/hr at 04/29/24 1300, 10 mcg/kg/min at 04/29/24 1300   QUEtiapine  (SEROQUEL ) tablet 200 mg, 200 mg, Per Tube, BID, Sharie Bourbon, MD, 200 mg at 04/29/24 0941   senna (SENOKOT) tablet 17.2 mg, 2 tablet, Per Tube, QHS, Shelah Lamar RAMAN, MD, 17.2 mg at 04/28/24 2108   sterile water  (preservative free) injection, , , ,    thiamine (VITAMIN B1) tablet 200 mg, 200 mg, Per Tube, Daily, Sharie Bourbon, MD, 200 mg at 04/29/24  0941  Labs and Diagnostic Imaging   CBC:  Recent Labs  Lab 04/23/24 0850 04/23/24 0928 04/24/24 0431 04/25/24 0521 04/27/24 0536 04/28/24 0508  WBC 8.4  --  28.3*   < > 13.8* 11.8*  NEUTROABS 7.6  --  25.8*  --   --   --   HGB 9.8*   < > 10.7*   < > 10.0* 9.5*  HCT 28.3*   < > 31.9*   < > 30.2* 29.4*  MCV 84.7  --  88.1   < > 88.6 89.6  PLT 205   < > 282   < > 247 231   < > = values in this interval not displayed.    Basic Metabolic Panel:  Lab Results  Component Value Date   NA 146 (H) 04/29/2024   K 3.9 04/29/2024   CO2 27 04/29/2024   GLUCOSE 92 04/29/2024   BUN 11 04/29/2024   CREATININE 0.68 04/29/2024   CALCIUM  8.0 (L) 04/29/2024   GFRNONAA >60 04/29/2024   GFRAA >60 07/21/2018   Lipid Panel:  Lab Results  Component Value Date   LDLCALC 100 (H) 04/22/2024   HgbA1c:  Lab Results  Component Value Date   HGBA1C 3.8 (L) 04/23/2024   Urine Drug Screen:     Component Value Date/Time   LABOPIA NONE DETECTED 04/23/2024 0107   COCAINSCRNUR POSITIVE (A) 04/23/2024 0107   COCAINSCRNUR POSITIVE (A) 08/21/2021 0220   LABBENZ POSITIVE (A) 04/23/2024 0107   AMPHETMU NONE DETECTED 04/23/2024 0107   THCU POSITIVE (A) 04/23/2024 0107   LABBARB NONE DETECTED 04/23/2024 0107    Alcohol Level     Component Value Date/Time   ETH <10 02/22/2023 0007   INR  Lab Results  Component Value Date   INR 1.1 04/23/2024   APTT  Lab Results  Component Value Date   APTT 26 04/23/2024   AED levels:  Lab Results  Component Value Date   LAMOTRIGINE  2.4 04/11/2024   LEVETIRACETA 25.0 04/11/2024      Assessment  CHINARA HERTZBERG is a 33 y.o. female with a PMHx of seizure disorder and polysubstance abuse who presented at [redacted] weeks pregnant with breakthrough seizure. UDS was cocaine and THC positive. She was taken for emergent C-section due to fetal heart decelerations and has delivered a baby. She remains intubated with a gradually improving exam on sedation.  - MRI of  the brain does not show any evidence of posterior reversible encephalopathy syndrome or any other acute abnormalities. - LTM EEG report (11/3): Continuous slow, generalized. Burst attenuation, generalized. This study is suggestive of diffuse cerebral dysfunction (encephalopathy) likely related to sedation. No seizures or epileptiform discharges were seen throughout the recording. - LTM EEG (11/4): Continuous slow, generalized. This study is suggestive of diffuse cerebral dysfunction (encephalopathy) likely related to sedation. No seizures or epileptiform discharges were  seen throughout the recording. - LTM EEG report (11/5): Continuous slow, generalized. This study is suggestive of diffuse cerebral dysfunction (encephalopathy) likely related to sedation. No seizures or epileptiform discharges were seen throughout the recording. - LTM EEG report (11/8): Continuous slow, generalized. This study is suggestive of diffuse cerebral dysfunction (encephalopathy) likely related to sedation. No seizures or epileptiform discharges were seen throughout the recording. 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    Recommendations  Continue Keppra  twice daily dosing at 1500 mg BID for now and after she is extubated, reduce the dose to her home dose of 1000 twice daily, while she is still on LTM.  Continue her lamotrigine  at 200 twice daily. Agree with the addition of Klonopin, which will be helpful if she is withdrawing from EtOH and also to manage possible agitation while weaning off her IV sedatives. Would increase to 3 mg BID.  Agree with increasing her Seroquel  to 200 mg BID.  Maintain seizure precautions Continue to gradually wean off sedation She remains on LTM-we will watch her on LTM, till there is a good clinical exam and medication dosages have been adjusted. Neurology will continue to follow  CRITICAL CARE Performed by: MERRIANNE, Tyresse Jayson  Total  critical care time: 40 minutes  Critical care time was exclusive of separately billable procedures and treating other patients.  Critical care was necessary to treat or prevent imminent or life-threatening deterioration.  Critical care was time spent personally by me on the following activities: development of treatment plan with patient and/or surrogate as well as nursing, discussions with consultants, evaluation of patient's response to treatment, examination of patient, obtaining history from patient or surrogate, ordering and performing treatments and interventions, ordering and review of laboratory studies, ordering and review of radiographic studies, pulse oximetry and re-evaluation of patient's condition.  ______________________________________________________________________   Bonney MERRIANNE, Jenel Gierke, MD [facility]

## 2024-04-29 NOTE — Procedures (Signed)
 Patient Name: Colleen Pearson  MRN: [id number]  Epilepsy Attending: Arlin MALVA Krebs  Referring Physician/Provider: Michaela Aisha SQUIBB, MD  Duration: 04/28/2024 1030 to 04/29/2024 1030   Patient history: 33 y.o. female past history of seizure, polysubstance abuse who is [redacted] weeks pregnant, brought in for breakthrough seizure. EEG to evaluate for seizure   Level of alertness: comatose/ lethargic    AEDs during EEG study: LEV, LTG, Clonazepam, Propofol    Technical aspects: This EEG study was done with scalp electrodes positioned according to the 10-20 International system of electrode placement. Electrical activity was reviewed with band pass filter of 1-70Hz , sensitivity of 7 uV/mm, display speed of 42mm/sec with a 60Hz  notched filter applied as appropriate. EEG data were recorded continuously and digitally stored.  Video monitoring was available and reviewed as appropriate.   Description: EEG showed continuous generalized 3 to 6 Hz theta-delta slowing. Hyperventilation and photic stimulation were not performed.     ABNORMALITY - Continuous slow, generalized   IMPRESSION: This study is suggestive of diffuse cerebral dysfunction (encephalopathy) likely related to sedation. No seizures or epileptiform discharges were seen throughout the recording.   Rafiel Mecca O Aspen Deterding

## 2024-04-29 NOTE — Progress Notes (Signed)
 POSTPARTUM PROGRESS NOTE  POD #5  Subjective: Pt currently sedated and intubated Per RN- pt has had BM, making adequate UOP Objective: Blood pressure 139/81, pulse (!) 120, temperature 98.7 F (37.1 C), temperature source Axillary, resp. rate 19, height 5' (1.524 m), weight 72.9 kg, SpO2 99%, unknown if currently breastfeeding.  Physical Exam:  General: ventilated, resting comfortably Heart: regular rate and rhythm Abdomen: soft, nontender, +BS- mostly on right Uterus: below umbilicus Incision: C/D/I with honeycomb DVT Evaluation: No calf swelling noted, SCDs in place Extremities: minimal edema Skin: warm, dry  Results for orders placed or performed during the hospital encounter of 04/23/24 (from the past 24 hours)  Glucose, capillary     Status: None   Collection Time: 04/28/24 11:25 AM  Result Value Ref Range   Glucose-Capillary 78 70 - 99 mg/dL  Glucose, capillary     Status: None   Collection Time: 04/28/24  3:27 PM  Result Value Ref Range   Glucose-Capillary 90 70 - 99 mg/dL  Glucose, capillary     Status: Abnormal   Collection Time: 04/28/24  7:25 PM  Result Value Ref Range   Glucose-Capillary 115 (H) 70 - 99 mg/dL  Glucose, capillary     Status: None   Collection Time: 04/28/24 11:22 PM  Result Value Ref Range   Glucose-Capillary 85 70 - 99 mg/dL  Glucose, capillary     Status: None   Collection Time: 04/29/24  3:32 AM  Result Value Ref Range   Glucose-Capillary 79 70 - 99 mg/dL  Basic metabolic panel with GFR     Status: Abnormal   Collection Time: 04/29/24  5:12 AM  Result Value Ref Range   Sodium 146 (H) 135 - 145 mmol/L   Potassium 3.9 3.5 - 5.1 mmol/L   Chloride 110 98 - 111 mmol/L   CO2 27 22 - 32 mmol/L   Glucose, Bld 92 70 - 99 mg/dL   BUN 11 6 - 20 mg/dL   Creatinine, Ser 9.31 0.44 - 1.00 mg/dL   Calcium  8.0 (L) 8.9 - 10.3 mg/dL   GFR, Estimated >39 >39 mL/min   Anion gap 9 5 - 15  Glucose, capillary     Status: Abnormal   Collection Time:  04/29/24  7:42 AM  Result Value Ref Range   Glucose-Capillary 69 (L) 70 - 99 mg/dL    Assessment/Plan: Colleen Pearson is a 33 y.o. [id number] s/p pLTCS at [redacted]w[redacted]d POD#5  -meeting postop milestones appropriately -would advise removal of honeycomb dressing in a few days -no further OB management at this time -management per primary team including critical care and Neuro  Please call [phone] [facility] Consult Attending Monday-Friday 8am - 5pm) or [phone] [facility] OB/GYN Attending On Call all day, every day) for any obstetric concerns at any time.    LOS: 6 days   Sevrin Sally, DO Faculty Attending, [facility] 04/29/2024, 9:54 AM

## 2024-04-29 NOTE — Progress Notes (Signed)
LTM maint complete - no skin breakdown under:  Fp1, F7, A1 

## 2024-04-29 NOTE — Plan of Care (Signed)
  Problem: Coping: Goal: Ability to adjust to condition or change in health will improve Outcome: Progressing   Problem: Fluid Volume: Goal: Ability to maintain a balanced intake and output will improve Outcome: Progressing   Problem: Health Behavior/Discharge Planning: Goal: Ability to identify and utilize available resources and services will improve Outcome: Progressing Goal: Ability to manage health-related needs will improve Outcome: Progressing   Problem: Metabolic: Goal: Ability to maintain appropriate glucose levels will improve Outcome: Progressing   Problem: Nutritional: Goal: Maintenance of adequate nutrition will improve Outcome: Progressing Goal: Progress toward achieving an optimal weight will improve Outcome: Progressing   Problem: Skin Integrity: Goal: Risk for impaired skin integrity will decrease Outcome: Progressing   Problem: Tissue Perfusion: Goal: Adequacy of tissue perfusion will improve Outcome: Progressing   Problem: Education: Goal: Knowledge of General Education information will improve Description: Including pain rating scale, medication(s)/side effects and non-pharmacologic comfort measures Outcome: Progressing   Problem: Health Behavior/Discharge Planning: Goal: Ability to manage health-related needs will improve Outcome: Progressing   Problem: Clinical Measurements: Goal: Ability to maintain clinical measurements within normal limits will improve Outcome: Progressing Goal: Will remain free from infection Outcome: Progressing Goal: Diagnostic test results will improve Outcome: Progressing Goal: Respiratory complications will improve Outcome: Progressing Goal: Cardiovascular complication will be avoided Outcome: Progressing   Problem: Activity: Goal: Risk for activity intolerance will decrease Outcome: Progressing   Problem: Nutrition: Goal: Adequate nutrition will be maintained Outcome: Progressing   Problem: Coping: Goal:  Level of anxiety will decrease Outcome: Progressing   Problem: Elimination: Goal: Will not experience complications related to bowel motility Outcome: Progressing Goal: Will not experience complications related to urinary retention Outcome: Progressing   Problem: Pain Managment: Goal: General experience of comfort will improve and/or be controlled Outcome: Progressing   Problem: Safety: Goal: Ability to remain free from injury will improve Outcome: Progressing   Problem: Skin Integrity: Goal: Risk for impaired skin integrity will decrease Outcome: Progressing   Problem: Education: Goal: Knowledge of the prescribed therapeutic regimen will improve Outcome: Progressing Goal: Understanding of sexual limitations or changes related to disease process or condition will improve Outcome: Progressing   Problem: Self-Concept: Goal: Communication of feelings regarding changes in body function or appearance will improve Outcome: Progressing   Problem: Skin Integrity: Goal: Demonstration of wound healing without infection will improve Outcome: Progressing   Problem: Education: Goal: Knowledge of condition will improve Outcome: Progressing   Problem: Activity: Goal: Will verbalize the importance of balancing activity with adequate rest periods Outcome: Progressing Goal: Ability to tolerate increased activity will improve Outcome: Progressing   Problem: Coping: Goal: Ability to identify and utilize available resources and services will improve Outcome: Progressing   Problem: Life Cycle: Goal: Chance of risk for complications during the postpartum period will decrease Outcome: Progressing   Problem: Role Relationship: Goal: Ability to demonstrate positive interaction with newborn will improve Outcome: Progressing   Problem: Skin Integrity: Goal: Demonstration of wound healing without infection will improve Outcome: Progressing

## 2024-04-29 NOTE — Plan of Care (Signed)
  Problem: Education: Goal: Ability to describe self-care measures that may prevent or decrease complications (Diabetes Survival Skills Education) will improve Outcome: Progressing Goal: Individualized Educational Video(s) Outcome: Progressing   Problem: Coping: Goal: Ability to adjust to condition or change in health will improve Outcome: Progressing   Problem: Fluid Volume: Goal: Ability to maintain a balanced intake and output will improve Outcome: Progressing   Problem: Health Behavior/Discharge Planning: Goal: Ability to identify and utilize available resources and services will improve Outcome: Progressing Goal: Ability to manage health-related needs will improve Outcome: Progressing   Problem: Metabolic: Goal: Ability to maintain appropriate glucose levels will improve Outcome: Progressing   Problem: Nutritional: Goal: Maintenance of adequate nutrition will improve Outcome: Progressing Goal: Progress toward achieving an optimal weight will improve Outcome: Progressing   Problem: Skin Integrity: Goal: Risk for impaired skin integrity will decrease Outcome: Progressing   Problem: Tissue Perfusion: Goal: Adequacy of tissue perfusion will improve Outcome: Progressing   Problem: Education: Goal: Knowledge of General Education information will improve Description: Including pain rating scale, medication(s)/side effects and non-pharmacologic comfort measures Outcome: Progressing   Problem: Health Behavior/Discharge Planning: Goal: Ability to manage health-related needs will improve Outcome: Progressing   Problem: Clinical Measurements: Goal: Ability to maintain clinical measurements within normal limits will improve Outcome: Progressing Goal: Will remain free from infection Outcome: Progressing Goal: Diagnostic test results will improve Outcome: Progressing Goal: Respiratory complications will improve Outcome: Progressing Goal: Cardiovascular complication will  be avoided Outcome: Progressing   Problem: Activity: Goal: Risk for activity intolerance will decrease Outcome: Progressing   Problem: Nutrition: Goal: Adequate nutrition will be maintained Outcome: Progressing   Problem: Coping: Goal: Level of anxiety will decrease Outcome: Progressing   Problem: Elimination: Goal: Will not experience complications related to bowel motility Outcome: Progressing Goal: Will not experience complications related to urinary retention Outcome: Progressing   Problem: Pain Managment: Goal: General experience of comfort will improve and/or be controlled Outcome: Progressing   Problem: Safety: Goal: Ability to remain free from injury will improve Outcome: Progressing   Problem: Skin Integrity: Goal: Risk for impaired skin integrity will decrease Outcome: Progressing   Problem: Safety: Goal: Non-violent Restraint(s) Outcome: Progressing   Problem: Education: Goal: Knowledge of the prescribed therapeutic regimen will improve Outcome: Progressing Goal: Understanding of sexual limitations or changes related to disease process or condition will improve Outcome: Progressing Goal: Individualized Educational Video(s) Outcome: Progressing   Problem: Self-Concept: Goal: Communication of feelings regarding changes in body function or appearance will improve Outcome: Progressing   Problem: Skin Integrity: Goal: Demonstration of wound healing without infection will improve Outcome: Progressing   Problem: Education: Goal: Knowledge of condition will improve Outcome: Progressing Goal: Individualized Educational Video(s) Outcome: Progressing Goal: Individualized Newborn Educational Video(s) Outcome: Progressing   Problem: Activity: Goal: Will verbalize the importance of balancing activity with adequate rest periods Outcome: Progressing Goal: Ability to tolerate increased activity will improve Outcome: Progressing   Problem: Coping: Goal:  Ability to identify and utilize available resources and services will improve Outcome: Progressing   Problem: Life Cycle: Goal: Chance of risk for complications during the postpartum period will decrease Outcome: Progressing   Problem: Role Relationship: Goal: Ability to demonstrate positive interaction with newborn will improve Outcome: Progressing   Problem: Skin Integrity: Goal: Demonstration of wound healing without infection will improve Outcome: Progressing

## 2024-04-29 NOTE — Progress Notes (Signed)
 NAME:  Colleen Pearson, MRN:  [id number], DOB:  03/15/1991, LOS: 6 ADMISSION DATE:  04/23/2024, CONSULTATION DATE:  04/22/2024 REFERRING MD:  Oneil Budge, MD, CHIEF COMPLAINT:  Status Epilepticus   History of Present Illness:  33 y/o female with h/o Epilepsy and Polysubstance d/o who is a [phone] and is currently [redacted] weeks pregnant who was on the cough and then have several seizures.  EMS gave 4mg  IV Magnesium  and 5mg  IV Midazolam .  She was lethargic with secretions not able to protect her airways and she was intubated in the ED.  She has been started on Versed  drip.  She was loaded with Keppra .  There has been reports of non-compliance with her seizure meds-Keppra  and Lamictal .  She also had low levels as outpatient.  She has seizures in September as well, similar situation and apparently has a seizure two weeks prior to that ED visit.  Pertinent  Medical History  Epilepsy and Polysubstance d/o, CF carrier  Significant Hospital Events: Including procedures, antibiotic start and stop dates in addition to other pertinent events   11/1: transfer to [facility] from [facility] 11/2 admitted to ICU intubated and sedated signs of fetal distress resulting in emergent C-section 11/3 remains intubated deeply sedated on LTM 11/4 sedation lightened > agitation, back to propofol   Interim History / Subjective:  On propofol  35, fentanyl  125 and Versed  as needed pushes-does not follow commands,trying to wean off sedation Trying to wean off sedation, will go up on clonazepam, and Seroquel  Continuous EEG-no seizures Will repeat MRI tomorrow if no improvement   Objective    Blood pressure 119/63, pulse (!) 124, temperature 98.7 F (37.1 C), temperature source Axillary, resp. rate (!) 23, height 5' (1.524 m), weight 72.9 kg, SpO2 99%, unknown if currently breastfeeding.    Vent Mode: PSV;CPAP FiO2 (%):  [40 %] 40 % Set Rate:  [18 bmp] 18 bmp Vt Set:  [380 mL] 380 mL PEEP:  [5 cmH20] 5 cmH20 Pressure Support:  [5  cmH20] 5 cmH20 Plateau Pressure:  [18 cmH20-25 cmH20] 23 cmH20   Intake/Output Summary (Last 24 hours) at 04/29/2024 0909 Last data filed at 04/29/2024 0816 Gross per 24 hour  Intake 2982.68 ml  Output 2625 ml  Net 357.68 ml   Filed Weights   04/27/24 0500 04/28/24 0500 04/29/24 0439  Weight: 67.8 kg 69.8 kg 72.9 kg    Examination:  General: Chronically ill-appearing woman, ventilated.  Currently calm HEENT: ET tube in good position, oropharynx clear Neuro: Calm currently on fentanyl  and propofol  but easily agitated.  She does not track, follow commands.  Moves all extremities with good strength. 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  Plan to continue Keppra  1500 mg twice daily while intubated, then decrease to 1000 mg twice daily post extubation - No seizures noted on continuous EEG, duration of monitoring to be determined by neurology - Agitated delirium an issue, due to her alcohol/substance abuse and withdrawal.  Uptitrating clonazepam and Seroquel .  Will work to wean propofol , possibly attempt another transition over to Precedex  now that longer acting sedating meds are on board - Will need substance abuse cessation counseling when feasible to do so  S/p emergent C-section at 32 weeks-for fetal distress - Latest baby is now extubated  P: -Appreciate obstetric care by  [facility] -Following for any evidence uterine bleeding  Acute hypoxic and hypercarbic respiratory failure Aspiration PNA Progressive pulmonary infiltrates 11/6, at risk ILI P: -Some progression of her pulmonary infiltrates although remains on 0.40, PEEP 5.  Would like to continue to push for SBT.  The other significant barrier is her agitated delirium, working to optimize sedation as above - Continue to follow chest x-ray - Completed 5-day course Unasyn.  Would reculture if she shows evidence for infection - Empiric steroids discontinued on 11/5 - Depending on course and predicted duration of MV then we may need to begin to consider tracheostomy  Acute renal failure, improved Hyperkalemia, resolved Hypernatremia P: -Significant bump in her serum creatinine 11/6 associated with hypernatremia.  Unclear whether this was a spurious lab - Continue to avoid nephrotoxins, ensure adequate renal perfusion - Continue free water  flushes at current dose  Leukocytosis, fever on 11/6 - Unasyn completed 11/6 - Reculture if she has recurrent fever  Acute blood loss anemia, post-op - following CBC   EEG not showing any seizure activity, does not follow commands, will continue weaning off sedation, DC'd Versed , and went up on clonazepam and Seroquel  Will repeat MRI on 11/9 if no improvement     Signature   Lenny Drought, MD  [facility] Prefer epic messenger for cross cover needs   My critical care time: 40 minutes  Critical care time was exclusive of separately billable procedures and treating other patients.  Critical care was necessary to treat or prevent imminent or life-threatening deterioration.  Critical care was time spent personally by me on the following activities: development of treatment plan with patient and/or surrogate as well as nursing, discussions with consultants, evaluation of patient's response to treatment, examination of patient, obtaining  history from patient or surrogate, ordering and performing treatments and interventions, ordering and review of laboratory studies, ordering and review of radiographic studies, pulse oximetry, re-evaluation of patient's condition and participation in multidisciplinary rounds.

## 2024-04-30 ENCOUNTER — Inpatient Hospital Stay (HOSPITAL_COMMUNITY)

## 2024-04-30 LAB — CBC
HCT: 29.3 % — ABNORMAL LOW (ref 36.0–46.0)
Hemoglobin: 9.4 g/dL — ABNORMAL LOW (ref 12.0–15.0)
MCH: 28.6 pg (ref 26.0–34.0)
MCHC: 32.1 g/dL (ref 30.0–36.0)
MCV: 89.1 fL (ref 80.0–100.0)
Platelets: 252 K/uL (ref 150–400)
RBC: 3.29 MIL/uL — ABNORMAL LOW (ref 3.87–5.11)
RDW: 14.7 % (ref 11.5–15.5)
WBC: 17.9 K/uL — ABNORMAL HIGH (ref 4.0–10.5)
nRBC: 0 % (ref 0.0–0.2)

## 2024-04-30 LAB — GLUCOSE, CAPILLARY
Glucose-Capillary: 103 mg/dL — ABNORMAL HIGH (ref 70–99)
Glucose-Capillary: 112 mg/dL — ABNORMAL HIGH (ref 70–99)
Glucose-Capillary: 80 mg/dL (ref 70–99)
Glucose-Capillary: 87 mg/dL (ref 70–99)
Glucose-Capillary: 110 mg/dL — ABNORMAL HIGH (ref 70–99)

## 2024-04-30 LAB — RENAL FUNCTION PANEL
Albumin: <1.5 g/dL — ABNORMAL LOW (ref 3.5–5.0)
Anion gap: 9 (ref 5–15)
BUN: 11 mg/dL (ref 6–20)
CO2: 27 mmol/L (ref 22–32)
Calcium: 8 mg/dL — ABNORMAL LOW (ref 8.9–10.3)
Chloride: 100 mmol/L (ref 98–111)
Creatinine, Ser: 0.58 mg/dL (ref 0.44–1.00)
GFR, Estimated: >60 mL/min (ref >60)
Glucose, Bld: 89 mg/dL (ref 70–99)
Phosphorus: 3.7 mg/dL (ref 2.5–4.6)
Potassium: 4.2 mmol/L (ref 3.5–5.1)
Sodium: 136 mmol/L (ref 135–145)

## 2024-04-30 MED ORDER — CLONAZEPAM 1 MG PO TABS
3.0000 mg | ORAL_TABLET | Freq: Three times a day (TID) | ORAL | Status: DC
  Administered 2024-04-30 – 2024-05-01 (×4): 3 mg
  Filled 2024-04-30 (×4): qty 3

## 2024-04-30 MED ORDER — QUETIAPINE FUMARATE 100 MG PO TABS
100.0000 mg | ORAL_TABLET | Freq: Three times a day (TID) | ORAL | Status: DC
  Administered 2024-04-30 (×3): 100 mg
  Filled 2024-04-30 (×3): qty 1

## 2024-04-30 MED ORDER — HYDROMORPHONE BOLUS VIA INFUSION
0.2500 mg | INTRAVENOUS | Status: DC | PRN
  Administered 2024-04-30: 0.5 mg via INTRAVENOUS
  Administered 2024-05-01 – 2024-05-02 (×14): 2 mg via INTRAVENOUS

## 2024-04-30 MED ORDER — MIDAZOLAM HCL (PF) 2 MG/2ML IJ SOLN
4.0000 mg | Freq: Once | INTRAMUSCULAR | Status: DC
  Filled 2024-04-30: qty 4
  Filled 2024-04-30: qty 1

## 2024-04-30 MED ORDER — THIAMINE MONONITRATE 100 MG PO TABS
100.0000 mg | ORAL_TABLET | Freq: Every day | ORAL | Status: DC
  Administered 2024-05-03 – 2024-05-08 (×6): 100 mg
  Filled 2024-04-30 (×6): qty 1

## 2024-04-30 MED ORDER — FREE WATER
50.0000 mL | Freq: Four times a day (QID) | Status: DC
  Administered 2024-04-30 – 2024-05-01 (×3): 50 mL

## 2024-04-30 MED ORDER — HYDROMORPHONE HCL-NACL 50-0.9 MG/50ML-% IV SOLN
0.5000 mg/h | INTRAVENOUS | Status: DC
  Administered 2024-04-30 – 2024-05-01 (×2): 1 mg/h via INTRAVENOUS
  Administered 2024-05-02: 3 mg/h via INTRAVENOUS
  Filled 2024-04-30 (×3): qty 50

## 2024-04-30 MED ORDER — GADOBUTROL 1 MMOL/ML IV SOLN
6.0000 mL | Freq: Once | INTRAVENOUS | Status: AC | PRN
  Administered 2024-04-30: 6 mL via INTRAVENOUS

## 2024-04-30 MED ORDER — HYDROMORPHONE HCL 1 MG/ML IJ SOLN
1.0000 mg | Freq: Once | INTRAMUSCULAR | Status: AC
  Administered 2024-04-30: 1 mg via INTRAVENOUS
  Filled 2024-04-30: qty 1

## 2024-04-30 MED ORDER — THIAMINE MONONITRATE 100 MG PO TABS
400.0000 mg | ORAL_TABLET | Freq: Every day | ORAL | Status: DC
  Administered 2024-04-30: 400 mg
  Filled 2024-04-30: qty 4

## 2024-04-30 MED ORDER — THIAMINE MONONITRATE 100 MG PO TABS
500.0000 mg | ORAL_TABLET | Freq: Every day | ORAL | Status: AC
  Administered 2024-05-01 – 2024-05-02 (×2): 500 mg
  Filled 2024-04-30 (×2): qty 5

## 2024-04-30 NOTE — Progress Notes (Signed)
 vLTM maintenance  All impedances below 25k.  No skin breakdown noted at A1 T3 CZ P3

## 2024-04-30 NOTE — Progress Notes (Signed)
 vLTM discontinued [facility] notified   No skin breakdown noted at A1 A2 T3 T4

## 2024-04-30 NOTE — Plan of Care (Signed)
  Problem: Coping: Goal: Ability to adjust to condition or change in health will improve Outcome: Progressing   Problem: Fluid Volume: Goal: Ability to maintain a balanced intake and output will improve Outcome: Progressing   Problem: Health Behavior/Discharge Planning: Goal: Ability to identify and utilize available resources and services will improve Outcome: Progressing Goal: Ability to manage health-related needs will improve Outcome: Progressing   Problem: Metabolic: Goal: Ability to maintain appropriate glucose levels will improve Outcome: Progressing   Problem: Nutritional: Goal: Maintenance of adequate nutrition will improve Outcome: Progressing Goal: Progress toward achieving an optimal weight will improve Outcome: Progressing   Problem: Skin Integrity: Goal: Risk for impaired skin integrity will decrease Outcome: Progressing   Problem: Tissue Perfusion: Goal: Adequacy of tissue perfusion will improve Outcome: Progressing   Problem: Education: Goal: Knowledge of General Education information will improve Description: Including pain rating scale, medication(s)/side effects and non-pharmacologic comfort measures Outcome: Progressing   Problem: Health Behavior/Discharge Planning: Goal: Ability to manage health-related needs will improve Outcome: Progressing   Problem: Clinical Measurements: Goal: Ability to maintain clinical measurements within normal limits will improve Outcome: Progressing Goal: Will remain free from infection Outcome: Progressing Goal: Diagnostic test results will improve Outcome: Progressing Goal: Respiratory complications will improve Outcome: Progressing Goal: Cardiovascular complication will be avoided Outcome: Progressing   Problem: Activity: Goal: Risk for activity intolerance will decrease Outcome: Progressing   Problem: Nutrition: Goal: Adequate nutrition will be maintained Outcome: Progressing   Problem: Coping: Goal:  Level of anxiety will decrease Outcome: Progressing   Problem: Elimination: Goal: Will not experience complications related to bowel motility Outcome: Progressing Goal: Will not experience complications related to urinary retention Outcome: Progressing   Problem: Pain Managment: Goal: General experience of comfort will improve and/or be controlled Outcome: Progressing   Problem: Safety: Goal: Ability to remain free from injury will improve Outcome: Progressing   Problem: Skin Integrity: Goal: Risk for impaired skin integrity will decrease Outcome: Progressing   Problem: Education: Goal: Knowledge of the prescribed therapeutic regimen will improve Outcome: Progressing Goal: Understanding of sexual limitations or changes related to disease process or condition will improve Outcome: Progressing   Problem: Self-Concept: Goal: Communication of feelings regarding changes in body function or appearance will improve Outcome: Progressing   Problem: Skin Integrity: Goal: Demonstration of wound healing without infection will improve Outcome: Progressing   Problem: Education: Goal: Knowledge of condition will improve Outcome: Progressing   Problem: Activity: Goal: Will verbalize the importance of balancing activity with adequate rest periods Outcome: Progressing Goal: Ability to tolerate increased activity will improve Outcome: Progressing   Problem: Coping: Goal: Ability to identify and utilize available resources and services will improve Outcome: Progressing   Problem: Life Cycle: Goal: Chance of risk for complications during the postpartum period will decrease Outcome: Progressing   Problem: Role Relationship: Goal: Ability to demonstrate positive interaction with newborn will improve Outcome: Progressing   Problem: Skin Integrity: Goal: Demonstration of wound healing without infection will improve Outcome: Progressing

## 2024-04-30 NOTE — Procedures (Signed)
 Patient Name: Colleen Pearson  MRN: [id number]  Epilepsy Attending: Arlin MALVA Krebs  Referring Physician/Provider: Michaela Aisha SQUIBB, MD  Duration: 04/29/2024 1030 to 04/30/2024 1006   Patient history: 33 y.o. female past history of seizure, polysubstance abuse who is [redacted] weeks pregnant, brought in for breakthrough seizure. EEG to evaluate for seizure   Level of alertness: comatose/ lethargic    AEDs during EEG study: LEV, LTG, Clonazepam   Technical aspects: This EEG study was done with scalp electrodes positioned according to the 10-20 International system of electrode placement. Electrical activity was reviewed with band pass filter of 1-70Hz , sensitivity of 7 uV/mm, display speed of 4mm/sec with a 60Hz  notched filter applied as appropriate. EEG data were recorded continuously and digitally stored.  Video monitoring was available and reviewed as appropriate.   Description: EEG showed continuous generalized 3 to 6 Hz theta-delta slowing. Hyperventilation and photic stimulation were not performed.     ABNORMALITY - Continuous slow, generalized   IMPRESSION: This study is suggestive of diffuse cerebral dysfunction (encephalopathy) likely related to sedation. No seizures or epileptiform discharges were seen throughout the recording.   Colleen Pearson

## 2024-04-30 NOTE — Progress Notes (Signed)
 NAME:  Colleen Pearson, MRN:  [id number], DOB:  1990/08/16, LOS: 7 ADMISSION DATE:  04/23/2024, CONSULTATION DATE:  04/22/2024 REFERRING MD:  Oneil Budge, MD, CHIEF COMPLAINT:  Status Epilepticus   History of Present Illness:  33 y/o female with h/o Epilepsy and Polysubstance d/o who is a [phone] and is currently [redacted] weeks pregnant who was on the cough and then have several seizures.  EMS gave 4mg  IV Magnesium  and 5mg  IV Midazolam .  She was lethargic with secretions not able to protect her airways and she was intubated in the ED.  She has been started on Versed  drip.  She was loaded with Keppra .  There has been reports of non-compliance with her seizure meds-Keppra  and Lamictal .  She also had low levels as outpatient.  She has seizures in September as well, similar situation and apparently has a seizure two weeks prior to that ED visit.  Pertinent  Medical History  Epilepsy and Polysubstance d/o, CF carrier  Significant Hospital Events: Including procedures, antibiotic start and stop dates in addition to other pertinent events   11/1: transfer to [facility] from [facility] 11/2 admitted to ICU intubated and sedated signs of fetal distress resulting in emergent C-section 11/3 remains intubated deeply sedated on LTM 11/4 sedation lightened > agitation, back to propofol   Interim History / Subjective:  Off propofol , but requiring very high doses of fentanyl , switched to Dilaudid On clonazepam and Seroquel  Continuous EEG no seizures, MRI no obvious/acute abnormality  Objective    Blood pressure (!) 146/75, pulse (!) 112, temperature (!) 100.4 F (38 C), temperature source Axillary, resp. rate 14, height 5' (1.524 m), weight 73 kg, SpO2 95%, unknown if currently breastfeeding.    Vent Mode: PRVC FiO2 (%):  [40 %] 40 % Set Rate:  [15 bmp-18 bmp] 18 bmp Vt Set:  [380 mL] 380 mL PEEP:  [5 cmH20] 5 cmH20 Pressure Support:  [5 cmH20] 5 cmH20 Plateau Pressure:  [19 cmH20] 19 cmH20   Intake/Output Summary  (Last 24 hours) at 04/30/2024 0742 Last data filed at 04/30/2024 0734 Gross per 24 hour  Intake 3655.6 ml  Output 1800 ml  Net 1855.6 ml   Filed Weights   04/28/24 0500 04/29/24 0439 04/30/24 0435  Weight: 69.8 kg 72.9 kg 73 kg    Examination:  General: On the vent, chronically ill-appearing HEENT: ET tube in good position, NCAT Neuro: Calm, pupils pinpoint bilaterally, does not follow no response to pain, decreased tone CV: Regular rate and rhythm normal Pulm: Symmetrical chest wall movement, clear to auscultation GI: Soft nontender nondistended GU: Deferred   Resolved problem list  Hyperkalemia Shock Lactic acidosis  Assessment and Plan   Sz disorder w status epilepticus, not compliant with medications at baseline Substance use disorder  Multifactorial encephalopathy nonpurposeful movement, with tachycardia required higher doses of fentanyl . Of propofol , but requiring high dose of fentanyl -switch to Dilaudid Readjusted clonazepam and Seroquel  for TID EEG not showing any seizure activity, repeat MRI no obvious signs of acute abnormality.  Ammonia  and BUN unremarkable -Appreciate neurology management.  Plan to continue Keppra  1500 mg twice daily while intubated, then decrease to 1000 mg twice daily post extubation - No seizures noted on continuous EEG, duration of monitoring to be determined by neurology - Agitated delirium due to her alcohol/substance abuse and withdrawal.  Uptitrating clonazepam and Seroquel .  Will work to wean propofol , possibly attempt another transition over to Precedex  now that longer acting sedating meds are on board - Will need substance abuse cessation  counseling when feasible to do so  S/p emergent C-section at 32 weeks-for fetal distress - Latest baby is now extubated  P: -Appreciate obstetric care by [facility] -Following for any evidence uterine bleeding  Acute hypoxic and hypercarbic respiratory failure Aspiration PNA Progressive pulmonary  infiltrates 11/6, at risk ILI P: -Some progression of her pulmonary infiltrates although remains on 0.40, PEEP 5.  Would like to continue to push for SBT.  The other significant barrier is her agitated delirium, working to optimize sedation as above - Continue to follow chest x-ray - Completed 5-day course Unasyn.  Would reculture if she shows evidence for infection - Empiric steroids discontinued on 11/5 - Depending on course and predicted duration of MV then we may need to begin to consider tracheostomy  Acute renal failure, improved Hyperkalemia, resolved Hypernatremia P: -Significant bump in her serum creatinine 11/6 associated with hypernatremia.  Unclear whether this was a spurious lab - Continue to avoid nephrotoxins, ensure adequate renal perfusion - Continue free water  flushes at current dose  Leukocytosis, fever on 11/6 - Unasyn completed 11/6 - Reculture if she has recurrent fever  Acute blood loss anemia, post-op - following CBC   Signature   Lenny Drought, MD  [facility] Prefer epic messenger for cross cover needs   My critical care time: 35 minutes  Critical care time was exclusive of separately billable procedures and treating other patients.  Critical care was necessary to treat or prevent imminent or life-threatening deterioration.  Critical care was time spent personally by me on the following activities: development of treatment plan with patient and/or surrogate as well as nursing, discussions with consultants, evaluation of patient's response to treatment, examination of patient, obtaining history from patient or surrogate, ordering and performing treatments and interventions, ordering and review of laboratory studies, ordering and review of radiographic studies, pulse oximetry, re-evaluation of patient's condition and participation in multidisciplinary rounds.

## 2024-04-30 NOTE — Progress Notes (Signed)
 RT NOTE: RT transported PT on ventilator from room 3M08 to MRI and back to room 3M08 with no apparent complications.

## 2024-04-30 NOTE — Progress Notes (Addendum)
 NEUROLOGY CONSULT FOLLOW UP NOTE   Date of service: April 30, 2024 Patient Name: Colleen Pearson MRN:  [id number] DOB:  04/14/1991  Interval Hx/subjective  Continues to be intubated. Sedated on fentanyl  at a rate of 400. Propofol  was discontinued at 1357 yesterday.   Vitals   Vitals:   04/30/24 0634 04/30/24 0649 04/30/24 0700 04/30/24 0729  BP:   (!) 146/75   Pulse: (!) 110 (!) 143 (!) 112   Resp: 20 18 14    Temp:    (!) 100.4 F (38 C)  TempSrc:    Axillary  SpO2: 97% 94% 95%   Weight:      Height:         Body mass index is 31.43 kg/m.  Physical Exam   General: Sedated on fentanyl  only. Intubated  HEENT: Normocephalic. No nuchal rigidity.  Respiratory: Vented Ext: No rash noted.    Neurological exam Ment: Sedated with fentanyl  at a rate of 400. Does not arouse to voice or sternal rub. No limb movement spontaneously or to noxious. Eyelids are partially open with no blinking noted.    CN: Pupils are pinpoint bilaterally. No blink to threat. Absent oculocephalic reflex. Brisk corneal reflexes bilaterally. Face flaccidly symmetric.  Motor/Sensory: Tone decreased x 4. No movement to noxious x 4. No posturing or other spontaneous movements. No asymmetry noted.  Reflexes: 1+ bilateral brachioradialis. 3+ right patellar, 2+ left patellar, 2+ bilateral achilles. Toes upgoing. With passive dorsiflexion of ankles, there are 8 beats of clonus on the right and 6 on the left.  Medications  Current Facility-Administered Medications:    acetaminophen  (TYLENOL ) tablet 650 mg, 650 mg, Per Tube, Q8H PRN, Byrum, Robert S, MD, 650 mg at 04/28/24 2354   Chlorhexidine  Gluconate Cloth 2 % PADS 6 each, 6 each, Topical, Daily, Arloa Folks D, NP, 6 each at 04/29/24 2000   clonazePAM (KLONOPIN) tablet 3 mg, 3 mg, Per Tube, TID, Sharie Bourbon, MD, 3 mg at 04/30/24 9178   coconut oil, 1 Application, Topical, PRN, Nicholaus Burnard HERO, MD, 1 Application at 04/28/24 2047   docusate (COLACE)  50 MG/5ML liquid 100 mg, 100 mg, Per Tube, BID, Byrum, Robert S, MD, 100 mg at 04/30/24 0930   famotidine  (PEPCID ) tablet 20 mg, 20 mg, Per Tube, BID, Maree Harder, MD, 20 mg at 04/30/24 0930   feeding supplement (OSMOLITE 1.5 CAL) liquid 1,000 mL, 1,000 mL, Per Tube, Continuous, Shelah Lamar RAMAN, MD, Last Rate: 45 mL/hr at 04/30/24 0700, Infusion Verify at 04/30/24 0700   fentaNYL  (SUBLIMAZE ) bolus via infusion 25-100 mcg, 25-100 mcg, Intravenous, Q15 min PRN, Smith, Joshua C, NP, 100 mcg at 04/30/24 0843   fentaNYL  in NS (71mcg/ml) infusion-PREMIX, 0-400 mcg/hr, Intravenous, Continuous, Claudene Chew C, NP, Last Rate: 40 mL/hr at 04/30/24 0700, 400 mcg/hr at 04/30/24 0700   free water  200 mL, 200 mL, Per Tube, Q4H, Byrum, Robert S, MD, 200 mL at 04/30/24 0734   heparin  injection 5,000 Units, 5,000 Units, Subcutaneous, Q8H, Maree Harder, MD, 5,000 Units at 04/30/24 0505   lamoTRIgine  (LAMICTAL ) tablet 200 mg, 200 mg, Per Tube, BID, Michaela Aisha SQUIBB, MD, 200 mg at 04/30/24 0930   levETIRAcetam  (KEPPRA ) undiluted injection 1,500 mg, 1,500 mg, Intravenous, Q12H, Arora, Ashish, MD, 1,500 mg at 04/30/24 9271   midazolam  PF (VERSED ) injection 4 mg, 4 mg, Intravenous, Once, Hussein, Bourbon, MD   ondansetron  (ZOFRAN ) injection 4 mg, 4 mg, Intravenous, Q6H PRN, Maree Harder, MD, 4 mg at 04/25/24 8165  Oral care mouth rinse, 15 mL, Mouth Rinse, Q2H, Maree Harder, MD, 15 mL at 04/30/24 0931   Oral care mouth rinse, 15 mL, Mouth Rinse, PRN, Maree Harder, MD, 15 mL at 04/28/24 0540   polyethylene glycol (MIRALAX  / GLYCOLAX ) packet 17 g, 17 g, Per Tube, Daily, Byrum, Lamar RAMAN, MD, 17 g at 04/30/24 0930   propofol  (DIPRIVAN ) 1000 MG/100ML infusion, 0-80 mcg/kg/min, Intravenous, Titrated, Mallory Sheela Kieth CHRISTELLA, MD, Stopped at 04/29/24 1357   QUEtiapine  (SEROQUEL ) tablet 100 mg, 100 mg, Per Tube, TID, Sharie Bourbon, MD, 100 mg at 04/30/24 0930   senna (SENOKOT) tablet 17.2 mg, 2 tablet, Per  Tube, QHS, Byrum, Robert S, MD, 17.2 mg at 04/29/24 2038   thiamine (VITAMIN B1) tablet 400 mg, 400 mg, Per Tube, Daily, Sharie Bourbon, MD, 400 mg at 04/30/24 0930  Labs and Diagnostic Imaging   CBC:  Recent Labs  Lab 04/24/24 0431 04/25/24 0521 04/28/24 0508 04/30/24 0829  WBC 28.3*   < > 11.8* 17.9*  NEUTROABS 25.8*  --   --   --   HGB 10.7*   < > 9.5* 9.4*  HCT 31.9*   < > 29.4* 29.3*  MCV 88.1   < > 89.6 89.1  PLT 282   < > 231 252   < > = values in this interval not displayed.    Basic Metabolic Panel:  Lab Results  Component Value Date   NA 136 04/30/2024   K 4.2 04/30/2024   CO2 27 04/30/2024   GLUCOSE 89 04/30/2024   BUN 11 04/30/2024   CREATININE 0.58 04/30/2024   CALCIUM  8.0 (L) 04/30/2024   GFRNONAA >60 04/30/2024   GFRAA >60 07/21/2018   Lipid Panel:  Lab Results  Component Value Date   LDLCALC 100 (H) 04/22/2024   HgbA1c:  Lab Results  Component Value Date   HGBA1C 3.8 (L) 04/23/2024   Urine Drug Screen:     Component Value Date/Time   LABOPIA NONE DETECTED 04/23/2024 0107   COCAINSCRNUR POSITIVE (A) 04/23/2024 0107   COCAINSCRNUR POSITIVE (A) 08/21/2021 0220   LABBENZ POSITIVE (A) 04/23/2024 0107   AMPHETMU NONE DETECTED 04/23/2024 0107   THCU POSITIVE (A) 04/23/2024 0107   LABBARB NONE DETECTED 04/23/2024 0107    Alcohol Level     Component Value Date/Time   ETH <10 02/22/2023 0007   INR  Lab Results  Component Value Date   INR 1.1 04/23/2024   APTT  Lab Results  Component Value Date   APTT 26 04/23/2024   AED levels:  Lab Results  Component Value Date   LAMOTRIGINE  2.4 04/11/2024   LEVETIRACETA 25.0 04/11/2024     Assessment  Colleen Pearson is a 33 y.o. female with a PMHx of seizure disorder and polysubstance abuse who presented at [redacted] weeks pregnant with breakthrough seizure. UDS was cocaine and THC positive. She was taken for emergent C-section due to fetal heart decelerations and has delivered a baby. She  remains intubated with a gradually improving exam on sedation. She is currently only on fentanyl . Propofol  was discontinued yesterday.  - MRI of the brain does not show any evidence of posterior reversible encephalopathy syndrome or any other acute abnormalities. - EEGs:  - LTM EEG report (11/3): Continuous slow, generalized. Burst attenuation, generalized. This study is suggestive of diffuse cerebral dysfunction (encephalopathy) likely related to sedation. No seizures or epileptiform discharges were seen throughout the recording. - LTM EEG (11/4): Continuous slow, generalized. This study is suggestive of diffuse  cerebral dysfunction (encephalopathy) likely related to sedation. No seizures or epileptiform discharges were seen throughout the recording. - LTM EEG report (11/5): Continuous slow, generalized. This study is suggestive of diffuse cerebral dysfunction (encephalopathy) likely related to sedation. No seizures or epileptiform discharges were seen throughout the recording. - LTM EEG report (11/8): Continuous slow, generalized. This study is suggestive of diffuse cerebral dysfunction (encephalopathy) likely related to sedation. No seizures or epileptiform discharges were seen throughout the recording. - LTM EEG report for this morning: Continuous slow, generalized. This study is suggestive of diffuse cerebral dysfunction (encephalopathy) likely related to sedation. No seizures or epileptiform discharges were seen throughout the recording.  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      Recommendations  - Discontinuing LTM EEG - Continue Keppra  twice daily dosing at 1500 mg BID for now. After she is extubated, reduce the dose to her home dose of 1000 twice daily.  - Continue her lamotrigine  at 200 twice daily. - Agree with continuing Klonopin, which will be helpful if she is withdrawing from EtOH and also to manage possible agitation while  weaning off her IV fentanyl .  - Agree with Seroquel  100 mg TID.  - Maintain seizure precautions - Continue to gradually wean off sedation - Neurology will continue to follow every 2-3 days while she is being weaned off fentanyl . Will likely sign off once she is fully awake, provide no seizure recurrence.   Addendum: - MRI brain w/wo contrast:  1. Widespread bilateral subarachnoid FLAIR hyperintensity with suggestion of subtle abnormality prepontine T1 signal also. This is favored to be artifact due to mechanical ventilation; but recommend follow up non-contrast head CT to exclude the possibility of subarachnoid hemorrhage. 2. Mildly motion degraded exam despite repeats, but stable and normal MRI appearance of the brain; no mesial temporal lobe abnormality identified on mildly degraded thin slice imaging. 3. Intubated  - CT head has been ordered  CRITICAL CARE Performed by: MERRIANNE, Dixie Jafri  Total critical care time: 35 minutes  Critical care time was exclusive of separately billable procedures and treating other patients.  Critical care was necessary to treat or prevent imminent or life-threatening deterioration.  Critical care was time spent personally by me on the following activities: development of treatment plan with patient and/or surrogate as well as nursing, discussions with consultants, evaluation of patient's response to treatment, examination of patient, obtaining history from patient or surrogate, ordering and performing treatments and interventions, ordering and review of laboratory studies, ordering and review of radiographic studies, pulse oximetry and re-evaluation of patient's condition.  ______________________________________________________________________   Bonney MERRIANNE, Precious Segall, MD [facility]

## 2024-05-01 ENCOUNTER — Inpatient Hospital Stay (HOSPITAL_COMMUNITY)

## 2024-05-01 DIAGNOSIS — F1410 Cocaine abuse, uncomplicated: Secondary | ICD-10-CM

## 2024-05-01 DIAGNOSIS — Z91199 Patient's noncompliance with other medical treatment and regimen due to unspecified reason: Secondary | ICD-10-CM

## 2024-05-01 LAB — URINALYSIS, MICROSCOPIC (REFLEX)

## 2024-05-01 LAB — URINALYSIS, ROUTINE W REFLEX MICROSCOPIC
Glucose, UA: NEGATIVE mg/dL
Ketones, ur: NEGATIVE mg/dL
Nitrite: NEGATIVE
Protein, ur: 100 mg/dL — AB
Specific Gravity, Urine: 1.015 (ref 1.005–1.030)
pH: 7 (ref 5.0–8.0)

## 2024-05-01 LAB — GLUCOSE, CAPILLARY
Glucose-Capillary: 112 mg/dL — ABNORMAL HIGH (ref 70–99)
Glucose-Capillary: 106 mg/dL — ABNORMAL HIGH (ref 70–99)
Glucose-Capillary: 99 mg/dL (ref 70–99)
Glucose-Capillary: 85 mg/dL (ref 70–99)
Glucose-Capillary: 77 mg/dL (ref 70–99)
Glucose-Capillary: 107 mg/dL — ABNORMAL HIGH (ref 70–99)

## 2024-05-01 MED ORDER — POLYETHYLENE GLYCOL 3350 17 G PO PACK
17.0000 g | PACK | Freq: Two times a day (BID) | ORAL | Status: DC
  Administered 2024-05-01 – 2024-05-03 (×4): 17 g
  Filled 2024-05-01 (×5): qty 1

## 2024-05-01 MED ORDER — IPRATROPIUM-ALBUTEROL 0.5-2.5 (3) MG/3ML IN SOLN
3.0000 mL | Freq: Four times a day (QID) | RESPIRATORY_TRACT | Status: DC | PRN
Start: 2024-05-01 — End: 2024-05-04
  Administered 2024-05-01 – 2024-05-04 (×4): 3 mL via RESPIRATORY_TRACT
  Filled 2024-05-01 (×4): qty 3

## 2024-05-01 MED ORDER — SODIUM CHLORIDE 3 % IN NEBU
4.0000 mL | INHALATION_SOLUTION | Freq: Two times a day (BID) | RESPIRATORY_TRACT | Status: AC
Start: 2024-05-01 — End: 2024-05-04
  Administered 2024-05-01 – 2024-05-02 (×4): 4 mL via RESPIRATORY_TRACT
  Filled 2024-05-01: qty 4
  Filled 2024-05-01 (×5): qty 15

## 2024-05-01 MED ORDER — FREE WATER
25.0000 mL | Freq: Four times a day (QID) | Status: DC
  Administered 2024-05-01 – 2024-05-05 (×16): 25 mL

## 2024-05-01 MED ORDER — SENNA 8.6 MG PO TABS
2.0000 | ORAL_TABLET | Freq: Two times a day (BID) | ORAL | Status: DC
  Administered 2024-05-01 – 2024-05-03 (×5): 17.2 mg
  Filled 2024-05-01 (×6): qty 2

## 2024-05-01 MED ORDER — CLONAZEPAM 1 MG PO TABS
3.0000 mg | ORAL_TABLET | Freq: Two times a day (BID) | ORAL | Status: DC
  Administered 2024-05-01: 3 mg
  Filled 2024-05-01: qty 3

## 2024-05-01 MED ORDER — ZINC OXIDE 40 % EX OINT
TOPICAL_OINTMENT | Freq: Three times a day (TID) | CUTANEOUS | Status: DC | PRN
Start: 2024-05-01 — End: 2024-05-08

## 2024-05-01 MED ORDER — QUETIAPINE FUMARATE 100 MG PO TABS
100.0000 mg | ORAL_TABLET | Freq: Every day | ORAL | Status: DC
  Administered 2024-05-01 – 2024-05-02 (×2): 100 mg
  Filled 2024-05-01 (×2): qty 1

## 2024-05-01 MED ORDER — PIPERACILLIN-TAZOBACTAM 3.375 G IVPB
3.3750 g | Freq: Three times a day (TID) | INTRAVENOUS | Status: AC
  Administered 2024-05-01 – 2024-05-07 (×19): 3.375 g via INTRAVENOUS
  Filled 2024-05-01 (×21): qty 50

## 2024-05-01 NOTE — Progress Notes (Signed)
 Pharmacy ICU Bowel Regimen Consult Note   Current Inpatient Medications for Bowel Management:  N/a  Assessment: Colleen Pearson is a 33 y.o. year old female admitted on 04/23/2024. Constipation identified as acute opioid-induced constipation  (on fentanyl  infusion since 11/2). Bowel regimen assessment completed by Colleen Pearson on 11/7. LBM prior to admission. Tube feeds started 11/5 PM.   [x]  Bowel sounds present (hypoactive) [x]  No abdominal tenderness  []  Passing gas   Plan: Increase Miralax  to BID and Senna 2 tabs to BID, PO route   Taden Witter D. Lendell, PharmD, BCPS, BCCCP 05/01/2024, 10:21 AM

## 2024-05-01 NOTE — Plan of Care (Signed)
  Problem: Coping: Goal: Ability to adjust to condition or change in health will improve Outcome: Progressing   Problem: Fluid Volume: Goal: Ability to maintain a balanced intake and output will improve Outcome: Progressing   Problem: Health Behavior/Discharge Planning: Goal: Ability to identify and utilize available resources and services will improve Outcome: Progressing Goal: Ability to manage health-related needs will improve Outcome: Progressing   Problem: Metabolic: Goal: Ability to maintain appropriate glucose levels will improve Outcome: Progressing   Problem: Nutritional: Goal: Maintenance of adequate nutrition will improve Outcome: Progressing Goal: Progress toward achieving an optimal weight will improve Outcome: Progressing   Problem: Skin Integrity: Goal: Risk for impaired skin integrity will decrease Outcome: Progressing   Problem: Tissue Perfusion: Goal: Adequacy of tissue perfusion will improve Outcome: Progressing   Problem: Education: Goal: Knowledge of General Education information will improve Description: Including pain rating scale, medication(s)/side effects and non-pharmacologic comfort measures Outcome: Progressing   Problem: Health Behavior/Discharge Planning: Goal: Ability to manage health-related needs will improve Outcome: Progressing   Problem: Clinical Measurements: Goal: Ability to maintain clinical measurements within normal limits will improve Outcome: Progressing Goal: Will remain free from infection Outcome: Progressing Goal: Diagnostic test results will improve Outcome: Progressing Goal: Respiratory complications will improve Outcome: Progressing Goal: Cardiovascular complication will be avoided Outcome: Progressing   Problem: Activity: Goal: Risk for activity intolerance will decrease Outcome: Progressing   Problem: Nutrition: Goal: Adequate nutrition will be maintained Outcome: Progressing   Problem: Coping: Goal:  Level of anxiety will decrease Outcome: Progressing   Problem: Elimination: Goal: Will not experience complications related to bowel motility Outcome: Progressing Goal: Will not experience complications related to urinary retention Outcome: Progressing   Problem: Pain Managment: Goal: General experience of comfort will improve and/or be controlled Outcome: Progressing   Problem: Safety: Goal: Ability to remain free from injury will improve Outcome: Progressing   Problem: Skin Integrity: Goal: Risk for impaired skin integrity will decrease Outcome: Progressing   Problem: Education: Goal: Knowledge of the prescribed therapeutic regimen will improve Outcome: Progressing Goal: Understanding of sexual limitations or changes related to disease process or condition will improve Outcome: Progressing   Problem: Self-Concept: Goal: Communication of feelings regarding changes in body function or appearance will improve Outcome: Progressing   Problem: Skin Integrity: Goal: Demonstration of wound healing without infection will improve Outcome: Progressing   Problem: Education: Goal: Knowledge of condition will improve Outcome: Progressing   Problem: Activity: Goal: Will verbalize the importance of balancing activity with adequate rest periods Outcome: Progressing Goal: Ability to tolerate increased activity will improve Outcome: Progressing   Problem: Coping: Goal: Ability to identify and utilize available resources and services will improve Outcome: Progressing   Problem: Life Cycle: Goal: Chance of risk for complications during the postpartum period will decrease Outcome: Progressing   Problem: Role Relationship: Goal: Ability to demonstrate positive interaction with newborn will improve Outcome: Progressing   Problem: Skin Integrity: Goal: Demonstration of wound healing without infection will improve Outcome: Progressing

## 2024-05-01 NOTE — Progress Notes (Signed)
 NEUROLOGY CONSULT FOLLOW UP NOTE   Date of service: May 01, 2024 Patient Name: Colleen Pearson MRN:  [id number] DOB:  January 24, 1991  Interval Hx/subjective   No significant changes  Vitals   Vitals:   05/01/24 0730 05/01/24 0745 05/01/24 0746 05/01/24 0800  BP:    119/64  Pulse:  (!) 103  (!) 104  Resp:  12  10  Temp: 98.2 F (36.8 C)     TempSrc: Axillary     SpO2:  100% 100% 100%  Weight:      Height:         Body mass index is 31.86 kg/m.  Physical Exam   Intubated in the ICU, on 1 mg/h of Dilaudid  Neurologic Examination    MS: Opens eyes to noxious stimulation, she does not fixate or track, she does not follow commands CN: Pupils are reactive bilaterally, eyes are slightly disconjugate, she does not look to either direction, face is symmetric Motor: She moves all extremities spontaneously, but does not follow commands Sensory: She withdraws to noxious stimulation bilaterally  Medications  Current Facility-Administered Medications:    acetaminophen  (TYLENOL ) tablet 650 mg, 650 mg, Per Tube, Q8H PRN, Byrum, Robert S, MD, 650 mg at 04/30/24 2130   Chlorhexidine  Gluconate Cloth 2 % PADS 6 each, 6 each, Topical, Daily, Arloa Folks D, NP, 6 each at 04/30/24 2000   clonazePAM (KLONOPIN) tablet 3 mg, 3 mg, Per Tube, BID, Sharie Bourbon, MD   coconut oil, 1 Application, Topical, PRN, Nicholaus Burnard HERO, MD, 1 Application at 04/28/24 2047   docusate (COLACE) 50 MG/5ML liquid 100 mg, 100 mg, Per Tube, BID, Byrum, Robert S, MD, 100 mg at 04/30/24 2130   famotidine  (PEPCID ) tablet 20 mg, 20 mg, Per Tube, BID, Maree Harder, MD, 20 mg at 04/30/24 2131   feeding supplement (OSMOLITE 1.5 CAL) liquid 1,000 mL, 1,000 mL, Per Tube, Continuous, Shelah Lamar RAMAN, MD, Last Rate: 45 mL/hr at 05/01/24 0800, Infusion Verify at 05/01/24 0800   free water  25 mL, 25 mL, Per Tube, Q6H, Sharie Bourbon, MD   heparin  injection 5,000 Units, 5,000 Units, Subcutaneous, Q8H, Maree Harder, MD, 5,000 Units at 05/01/24 0535   HYDROmorphone (DILAUDID) 50 mg in 50 mL NS (1mg /mL) premix infusion, 0.5-4 mg/hr, Intravenous, Continuous, Hussein, Bourbon, MD, Last Rate: 1 mL/hr at 05/01/24 0800, 1 mg/hr at 05/01/24 0800   HYDROmorphone (DILAUDID) bolus via infusion 0.25-2 mg, 0.25-2 mg, Intravenous, Q30 min PRN, Sharie Bourbon, MD, 2 mg at 05/01/24 0515   ipratropium-albuterol  (DUONEB) 0.5-2.5 (3) MG/3ML nebulizer solution 3 mL, 3 mL, Nebulization, Q6H PRN, Sharie Bourbon, MD   lamoTRIgine  (LAMICTAL ) tablet 200 mg, 200 mg, Per Tube, BID, Michaela Aisha SQUIBB, MD, 200 mg at 04/30/24 2130   levETIRAcetam  (KEPPRA ) undiluted injection 1,500 mg, 1,500 mg, Intravenous, Q12H, Arora, Ashish, MD, 1,500 mg at 05/01/24 0740   ondansetron  (ZOFRAN ) injection 4 mg, 4 mg, Intravenous, Q6H PRN, Maree Harder, MD, 4 mg at 04/25/24 1834   Oral care mouth rinse, 15 mL, Mouth Rinse, Q2H, Maree Harder, MD, 15 mL at 05/01/24 9271   Oral care mouth rinse, 15 mL, Mouth Rinse, PRN, Maree Harder, MD, 15 mL at 04/28/24 0540   piperacillin -tazobactam (ZOSYN ) IVPB 3.375 g, 3.375 g, Intravenous, Q8H, Hussein, Abdullahi, MD   polyethylene glycol (MIRALAX  / GLYCOLAX ) packet 17 g, 17 g, Per Tube, Daily, Byrum, Robert S, MD, 17 g at 04/30/24 0930   QUEtiapine  (SEROQUEL ) tablet 100 mg, 100 mg, Per Tube, QHS, Hussein, Ralston,  MD   senna (SENOKOT) tablet 17.2 mg, 2 tablet, Per Tube, QHS, Byrum, Robert S, MD, 17.2 mg at 04/30/24 2130   sodium chloride  HYPERTONIC 3 % nebulizer solution 4 mL, 4 mL, Nebulization, BID, Hussein, Abdullahi, MD   thiamine (VITAMIN B1) tablet 500 mg, 500 mg, Per Tube, Daily **FOLLOWED BY** [START ON 05/03/2024] thiamine (VITAMIN B1) tablet 100 mg, 100 mg, Per Tube, Daily, Hussein, Lenny, MD  Labs and Diagnostic Imaging   Creatinine 0.58    Assessment   Colleen Pearson is a 33 y.o. female with breakthrough seizures in the setting of cocaine/possible medication  noncompliance/pregnancy.  She remains agitated and has therefore been started on clonazepam 3 mg twice daily, and is on Dilaudid 1 mg/h and Seroquel  100 mg 3 times daily.  I suspect that her sedation is the etiology of her continued difficulty with getting her to follow commands, and this is being gradually weaned.  Recommendations  Continue Keppra  1500 mg twice daily(increased from home 1000 mg twice daily) Continue lamotrigine  200 mg twice daily(home dose just prior to admission) Check lamotrigine  level as levels of this can increase following delivery, though her dose was not increased by much during pregnancy (was on 125 twice daily and now on 200 twice daily) Continue to wean sedation per CCM  ______________________________________________________________________   Signed, Aisha Seals, MD [facility]

## 2024-05-01 NOTE — TOC Progression Note (Signed)
 Transition of Care [facility]) - Progression Note    Patient Details  Name: Colleen Pearson MRN: [id number] Date of Birth: [birth year]  Transition of Care [facility]) CM/SW Contact  Tom-Johnson, Adrien Dietzman Daphne, RN Phone Number: 05/01/2024, 2:09 PM  Clinical Narrative:     Patient continues to be intubated and sedated, weaning off Vent. On IV abx. LTM EEG d/c'd, Neurology following.   No ICM needs or recommendations noted at this time.  Patient not Medically ready for discharge.  CM will continue to follow as patient progresses with care towards discharge.                     Expected Discharge Plan and Services                                               Social Drivers of Health (SDOH) Interventions SDOH Screenings   Food Insecurity: No Food Insecurity (04/27/2024)  Housing: Unknown (04/27/2024)  Transportation Needs: Patient Unable To Answer (04/27/2024)  Utilities: Patient Unable To Answer (04/27/2024)  Alcohol Screen: Low Risk  (12/27/2023)  Depression (PHQ2-9): Low Risk  (12/27/2023)  Financial Resource Strain: Medium Risk (12/27/2023)  Physical Activity: Inactive (12/27/2023)  Social Connections: Unknown (12/27/2023)  Stress: No Stress Concern Present (12/27/2023)  Tobacco Use: Medium Risk (04/23/2024)  Health Literacy: Adequate Health Literacy (12/27/2023)    Readmission Risk Interventions    03/03/2023    3:16 PM  Readmission Risk Prevention Plan  Medication Screening Complete  Transportation Screening Complete

## 2024-05-01 NOTE — Progress Notes (Signed)
 NAME:  Colleen Pearson, MRN:  [id number], DOB:  10-10-1990, LOS: 8 ADMISSION DATE:  04/23/2024, CONSULTATION DATE:  04/22/2024 REFERRING MD:  Oneil Budge, MD, CHIEF COMPLAINT:  Status Epilepticus   History of Present Illness:  33 y/o female with h/o Epilepsy and Polysubstance d/o who is [phone] and is currently [redacted] weeks pregnant who was on the cough and then have several seizures.  EMS gave 4mg  IV Magnesium  and 5mg  IV Midazolam .  She was lethargic with secretions not able to protect her airways and she was intubated in the ED.  She has been started on Versed  drip.  She was loaded with Keppra .  There has been reports of non-compliance with her seizure meds-Keppra  and Lamictal .  She also had low levels as outpatient.  She has seizures in September as well, similar situation and apparently has a seizure two weeks prior to that ED visit.  Pertinent  Medical History  Epilepsy and Polysubstance d/o, CF carrier  Significant Hospital Events: Including procedures, antibiotic start and stop dates in addition to other pertinent events   11/1: transfer to [facility] from [facility] 11/2 admitted to ICU intubated and sedated signs of fetal distress resulting in emergent C-section 11/3 remains intubated deeply sedated on LTM 11/4 sedation lightened > agitation, back to propofol   Interim History / Subjective:  Off sedation, switched from fentanyl  to Dilaudid gtt. on 11/9 On clonazepam and Seroquel  Continuous EEG no seizures, MRI no obvious/acute abnormality  Objective    Blood pressure (!) 104/55, pulse (!) 102, temperature 98.2 F (36.8 C), temperature source Axillary, resp. rate 11, height 5' (1.524 m), weight 74 kg, SpO2 99%, unknown if currently breastfeeding.    Vent Mode: PRVC FiO2 (%):  [40 %] 40 % Set Rate:  [18 bmp] 18 bmp Vt Set:  [380 mL] 380 mL PEEP:  [5 cmH20] 5 cmH20 Pressure Support:  [5 cmH20] 5 cmH20 Plateau Pressure:  [18 cmH20-28 cmH20] 18 cmH20   Intake/Output Summary (Last 24 hours) at  05/01/2024 0746 Last data filed at 05/01/2024 0700 Gross per 24 hour  Intake 1709.68 ml  Output 2850 ml  Net -1140.32 ml   Filed Weights   04/29/24 0439 04/30/24 0435 05/01/24 0410  Weight: 72.9 kg 73 kg 74 kg    Examination:  Gen: On minimal vent setting, chronically ill-appearing HEENT: ET tube in good position, NCAT Neuro: Pupils still pinpoint and sluggish, does not follow, no response to pain CV: S1-S2 normal, RRR, Pulm: Symmetrical chest wall movement, clear to auscultation GI: Soft nontender nondistended GU: Deferred   Resolved problem list  Hyperkalemia Shock Lactic acidosis  Assessment and Plan   Sz disorder w status epilepticus, not compliant with medications at baseline Substance use disorder  Multifactorial encephalopathy nonpurposeful movement, with tachycardia required higher doses of fentanyl . Of propofol , but requiring high dose of fentanyl -switch to Dilaudid Readjusted clonazepam and Seroquel  for TID EEG not showing any seizure activity, repeat MRI no obvious signs of acute abnormality.  Ammonia  and BUN unremarkable -Appreciate neurology management.  Plan to continue Keppra  1500 mg twice daily while intubated, then decrease to 1000 mg twice daily post extubation - No seizures noted on continuous EEG, duration of monitoring to be determined by neurology - Agitated delirium due to her alcohol/substance abuse and withdrawal.  Uptitrating clonazepam and Seroquel .  Will work to wean propofol , possibly attempt another transition over to Precedex  now that longer acting sedating meds are on board - Will need substance abuse cessation counseling when feasible to do  so  S/p emergent C-section at 40 weeks-for fetal distress - Latest baby is now extubated  P: -Appreciate obstetric care by [facility] -Following for any evidence uterine bleeding  Acute hypoxic and hypercarbic respiratory failure Aspiration PNA Progressive pulmonary infiltrates 11/6, at risk ILI P: -Some  progression of her pulmonary infiltrates although remains on 0.40, PEEP 5.  Would like to continue to push for SBT.  The other significant barrier is her agitated delirium, working to optimize sedation as above - Continue to follow chest x-ray - Completed 5-day course Unasyn.  Would reculture if she shows evidence for infection - Empiric steroids discontinued on 11/5 - Depending on course and predicted duration of MV then we may need to begin to consider tracheostomy  Acute renal failure, improved Hyperkalemia, resolved Hypernatremia P: -Significant bump in her serum creatinine 11/6 associated with hypernatremia.  Unclear whether this was a spurious lab - Continue to avoid nephrotoxins, ensure adequate renal perfusion - Continue free water  flushes at current dose   Leukocytosis-WBC 17, 1 fever episodes 38 C, increased secretion - Respiratory culture, urine culture, started Zosyn  - DC'd right IJ central line  Acute blood loss anemia, post-op - following CBC   Signature   Lenny Drought, MD  [facility] Prefer epic messenger for cross cover needs   My critical care time: 30 minutes  Critical care time was exclusive of separately billable procedures and treating other patients.  Critical care was necessary to treat or prevent imminent or life-threatening deterioration.  Critical care was time spent personally by me on the following activities: development of treatment plan with patient and/or surrogate as well as nursing, discussions with consultants, evaluation of patient's response to treatment, examination of patient, obtaining history from patient or surrogate, ordering and performing treatments and interventions, ordering and review of laboratory studies, ordering and review of radiographic studies, pulse oximetry, re-evaluation of patient's condition and participation in multidisciplinary rounds.

## 2024-05-01 NOTE — Progress Notes (Signed)
 RT NOTE: Sputum sample obtained and sent to lab at this time.

## 2024-05-02 LAB — POCT I-STAT 7, (LYTES, BLD GAS, ICA,H+H)
Acid-Base Excess: 7 mmol/L — ABNORMAL HIGH (ref 0.0–2.0)
Bicarbonate: 33.6 mmol/L — ABNORMAL HIGH (ref 20.0–28.0)
Calcium, Ion: 1.19 mmol/L (ref 1.15–1.40)
HCT: 27 % — ABNORMAL LOW (ref 36.0–46.0)
Hemoglobin: 9.2 g/dL — ABNORMAL LOW (ref 12.0–15.0)
O2 Saturation: 80 %
Patient temperature: 98.3
Potassium: 4.8 mmol/L (ref 3.5–5.1)
Sodium: 138 mmol/L (ref 135–145)
TCO2: 35 mmol/L — ABNORMAL HIGH (ref 22–32)
pCO2 arterial: 54.9 mmHg — ABNORMAL HIGH (ref 32–48)
pH, Arterial: 7.393 (ref 7.35–7.45)
pO2, Arterial: 45 mmHg — ABNORMAL LOW (ref 83–108)

## 2024-05-02 LAB — BASIC METABOLIC PANEL WITH GFR
Anion gap: 8 (ref 5–15)
Anion gap: 14 (ref 5–15)
BUN: 15 mg/dL (ref 6–20)
BUN: 17 mg/dL (ref 6–20)
CO2: 28 mmol/L (ref 22–32)
CO2: 29 mmol/L (ref 22–32)
Calcium: 8 mg/dL — ABNORMAL LOW (ref 8.9–10.3)
Calcium: 8.3 mg/dL — ABNORMAL LOW (ref 8.9–10.3)
Chloride: 104 mmol/L (ref 98–111)
Chloride: 98 mmol/L (ref 98–111)
Creatinine, Ser: 0.53 mg/dL (ref 0.44–1.00)
Creatinine, Ser: 0.83 mg/dL (ref 0.44–1.00)
GFR, Estimated: >60 mL/min (ref >60)
GFR, Estimated: >60 mL/min (ref >60)
Glucose, Bld: 103 mg/dL — ABNORMAL HIGH (ref 70–99)
Glucose, Bld: 90 mg/dL (ref 70–99)
Potassium: 4.6 mmol/L (ref 3.5–5.1)
Potassium: 5.1 mmol/L (ref 3.5–5.1)
Sodium: 140 mmol/L (ref 135–145)
Sodium: 141 mmol/L (ref 135–145)

## 2024-05-02 LAB — LAMOTRIGINE LEVEL: Lamotrigine Lvl: 10.6 ug/mL (ref 2.0–20.0)

## 2024-05-02 LAB — CBC
HCT: 26.7 % — ABNORMAL LOW (ref 36.0–46.0)
HCT: 29.6 % — ABNORMAL LOW (ref 36.0–46.0)
Hemoglobin: 8.6 g/dL — ABNORMAL LOW (ref 12.0–15.0)
Hemoglobin: 9.5 g/dL — ABNORMAL LOW (ref 12.0–15.0)
MCH: 28.9 pg (ref 26.0–34.0)
MCH: 28.8 pg (ref 26.0–34.0)
MCHC: 32.2 g/dL (ref 30.0–36.0)
MCHC: 32.1 g/dL (ref 30.0–36.0)
MCV: 89.6 fL (ref 80.0–100.0)
MCV: 89.7 fL (ref 80.0–100.0)
Platelets: 357 K/uL (ref 150–400)
Platelets: 506 K/uL — ABNORMAL HIGH (ref 150–400)
RBC: 2.98 MIL/uL — ABNORMAL LOW (ref 3.87–5.11)
RBC: 3.3 MIL/uL — ABNORMAL LOW (ref 3.87–5.11)
RDW: 14.6 % (ref 11.5–15.5)
RDW: 14.4 % (ref 11.5–15.5)
WBC: 16.4 K/uL — ABNORMAL HIGH (ref 4.0–10.5)
WBC: 19.7 K/uL — ABNORMAL HIGH (ref 4.0–10.5)
nRBC: 0 % (ref 0.0–0.2)
nRBC: 0 % (ref 0.0–0.2)

## 2024-05-02 LAB — GLUCOSE, CAPILLARY
Glucose-Capillary: 87 mg/dL (ref 70–99)
Glucose-Capillary: 84 mg/dL (ref 70–99)
Glucose-Capillary: 98 mg/dL (ref 70–99)
Glucose-Capillary: 85 mg/dL (ref 70–99)
Glucose-Capillary: 93 mg/dL (ref 70–99)
Glucose-Capillary: 88 mg/dL (ref 70–99)

## 2024-05-02 LAB — RESPIRATORY PANEL BY PCR

## 2024-05-02 LAB — BRAIN NATRIURETIC PEPTIDE: B Natriuretic Peptide: 116 pg/mL — ABNORMAL HIGH (ref 0.0–100.0)

## 2024-05-02 MED ORDER — OXYCODONE HCL 5 MG PO TABS
5.0000 mg | ORAL_TABLET | Freq: Four times a day (QID) | ORAL | Status: DC
  Administered 2024-05-02: 5 mg
  Filled 2024-05-02: qty 1

## 2024-05-02 MED ORDER — KETAMINE HCL-SODIUM CHLORIDE 1000-0.69 MG/100ML-% IV SOLN
1.2000 mg/kg/h | INTRAVENOUS | Status: DC
  Administered 2024-05-02 – 2024-05-03 (×2): 1.2 mg/kg/h via INTRAVENOUS
  Filled 2024-05-02 (×2): qty 100

## 2024-05-02 MED ORDER — HYDROMORPHONE HCL 1 MG/ML IJ SOLN
1.0000 mg | INTRAMUSCULAR | Status: AC | PRN
  Administered 2024-05-03 (×3): 1 mg via INTRAVENOUS
  Filled 2024-05-02 (×2): qty 1

## 2024-05-02 MED ORDER — QUETIAPINE FUMARATE 50 MG PO TABS
50.0000 mg | ORAL_TABLET | Freq: Every day | ORAL | Status: DC
  Administered 2024-05-02: 50 mg
  Filled 2024-05-02: qty 1

## 2024-05-02 MED ORDER — FUROSEMIDE 10 MG/ML IJ SOLN
40.0000 mg | Freq: Once | INTRAMUSCULAR | Status: AC
  Administered 2024-05-02: 40 mg via INTRAVENOUS
  Filled 2024-05-02: qty 4

## 2024-05-02 MED ORDER — CLONAZEPAM 1 MG PO TABS
1.0000 mg | ORAL_TABLET | Freq: Three times a day (TID) | ORAL | Status: DC
  Administered 2024-05-02 – 2024-05-03 (×4): 1 mg
  Filled 2024-05-02 (×4): qty 1

## 2024-05-02 MED ORDER — KETAMINE HCL-SODIUM CHLORIDE 1000-0.69 MG/100ML-% IV SOLN
1.0000 mg/kg/h | INTRAVENOUS | Status: DC
  Administered 2024-05-02: 1 mg/kg/h via INTRAVENOUS
  Filled 2024-05-02 (×2): qty 100

## 2024-05-02 MED ORDER — LEVETIRACETAM 100 MG/ML PO SOLN
1500.0000 mg | Freq: Two times a day (BID) | ORAL | Status: DC
  Administered 2024-05-02 – 2024-05-08 (×12): 1500 mg
  Filled 2024-05-02 (×12): qty 15

## 2024-05-02 MED ORDER — KETAMINE HCL-SODIUM CHLORIDE 1000-0.69 MG/100ML-% IV SOLN
1.0000 mg/kg/h | INTRAVENOUS | Status: DC
Start: 2024-05-02 — End: 2024-05-02

## 2024-05-02 MED ORDER — OXYCODONE HCL 5 MG PO TABS: 5.0000 mg | ORAL_TABLET | Freq: Four times a day (QID) | ORAL | Status: DC | PRN

## 2024-05-02 MED ORDER — HYDROMORPHONE HCL 1 MG/ML IJ SOLN
INTRAMUSCULAR | Status: AC
  Administered 2024-05-02: 1 mg
  Filled 2024-05-02: qty 1

## 2024-05-02 MED ORDER — DEXMEDETOMIDINE HCL IN NACL 400 MCG/100ML IV SOLN
0.0000 ug/kg/h | INTRAVENOUS | Status: DC
  Administered 2024-05-02: 0.4 ug/kg/h via INTRAVENOUS
  Filled 2024-05-02: qty 100

## 2024-05-02 MED ORDER — PROPOFOL 1000 MG/100ML IV EMUL
0.0000 ug/kg/min | INTRAVENOUS | Status: DC
  Administered 2024-05-02: 60 ug/kg/min via INTRAVENOUS
  Administered 2024-05-02: 10 ug/kg/min via INTRAVENOUS
  Administered 2024-05-02: 60 ug/kg/min via INTRAVENOUS
  Administered 2024-05-03: 20 ug/kg/min via INTRAVENOUS
  Administered 2024-05-03: 60 ug/kg/min via INTRAVENOUS
  Administered 2024-05-03: 25 ug/kg/min via INTRAVENOUS
  Administered 2024-05-03: 60 ug/kg/min via INTRAVENOUS
  Administered 2024-05-04: 30 ug/kg/min via INTRAVENOUS
  Filled 2024-05-02 (×8): qty 100

## 2024-05-02 MED ORDER — OXYCODONE HCL 5 MG PO TABS
10.0000 mg | ORAL_TABLET | Freq: Four times a day (QID) | ORAL | Status: DC
  Administered 2024-05-02 – 2024-05-03 (×4): 10 mg
  Filled 2024-05-02 (×4): qty 2

## 2024-05-02 MED ORDER — HYDROMORPHONE HCL 1 MG/ML IJ SOLN
1.0000 mg | INTRAMUSCULAR | Status: DC | PRN
  Administered 2024-05-03: 2 mg via INTRAVENOUS
  Administered 2024-05-03: 1 mg via INTRAVENOUS
  Filled 2024-05-02 (×2): qty 1
  Filled 2024-05-02 (×3): qty 2

## 2024-05-02 MED ORDER — HYDROMORPHONE HCL 1 MG/ML IJ SOLN
1.0000 mg | Freq: Once | INTRAMUSCULAR | Status: AC
  Administered 2024-05-02: 1 mg via INTRAVENOUS
  Filled 2024-05-02: qty 1

## 2024-05-02 NOTE — Progress Notes (Signed)
 Nutrition Follow-up  DOCUMENTATION CODES:  Not applicable  INTERVENTION:  If pt remains intubated, continue tube feeds via OGT: Osmolite 1.5 at 45 ml/h (1080 ml per day) Free water  flushes 2ml q6h per CCM ( total daily) Provides 1620 kcal, 67 gm protein, 822 ml free water  daily (922ml total daily- TF + FWF)  If pt does not extubate today or if extubated and unable to pass swallow assessment, recommend Cortrak placement next date for ongoing nutrition support.   NUTRITION DIAGNOSIS:  Inadequate oral intake related to inability to eat as evidenced by NPO status. - remains applicable  GOAL:  Patient will meet greater than or equal to 90% of their needs - goal met via TF  MONITOR:  Diet advancement, Vent status  REASON FOR ASSESSMENT:  Consult Assessment of nutrition requirement/status  ASSESSMENT:  Pt with hx of epilepsy and polysubstance disorder who underwent emergent C-section at 32 weeks due to several seizures that led to fetal distress. Pt admitted from [facility] and placed on ventilator.   Patient discussed in rounds. She remains intubated on vent support.  TF held in attempt to SAT and attempt extubation; agitation has been barrier to discharge..   Pt has been tolerating tube feeds at goal rate via OGT.  Would recommend Cortrak placement for supplemental nutrition support if unable to extubate or if unable to advance diet post extubation.   Admit weight: 70.8 kg (+emergent c-section post admission) Current weight: 73.6 kg + edema: mild pitting BUE  UOP: x24 hours  Medications: miralax  BID, senna BID, thiamine daily  Drips: Abx Propofol  @ 4.87ml/hr   Labs:  reviewed  CBG's 77-107 x24 hours  Diet Order:   Diet Order             Diet NPO time specified  Diet effective now                   EDUCATION NEEDS:   Not appropriate for education at this time  Skin:  Skin Assessment: Skin Integrity Issues: Skin Integrity Issues::  Incisions Incisions: abdomen and perineum  Last BM:  11/10 type 7 medium  Height:   Ht Readings from Last 1 Encounters:  04/28/24 5' (1.524 m)    Weight:   Wt Readings from Last 1 Encounters:  05/02/24 73.6 kg   BMI:  Body mass index is 31.69 kg/m.  Estimated Nutritional Needs:   Kcal:  1600-1800  Protein:  65-85g  Fluid:  1.6-1.8L  Royce Maris, RDN, LDN Clinical Nutrition See AMiON for contact information.

## 2024-05-02 NOTE — Plan of Care (Signed)
  Problem: Fluid Volume: Goal: Ability to maintain a balanced intake and output will improve Outcome: Progressing   Problem: Metabolic: Goal: Ability to maintain appropriate glucose levels will improve Outcome: Progressing   Problem: Nutritional: Goal: Maintenance of adequate nutrition will improve Outcome: Progressing Goal: Progress toward achieving an optimal weight will improve Outcome: Progressing   Problem: Skin Integrity: Goal: Risk for impaired skin integrity will decrease Outcome: Progressing   Problem: Tissue Perfusion: Goal: Adequacy of tissue perfusion will improve Outcome: Progressing   Problem: Clinical Measurements: Goal: Ability to maintain clinical measurements within normal limits will improve Outcome: Progressing Goal: Will remain free from infection Outcome: Progressing Goal: Diagnostic test results will improve Outcome: Progressing Goal: Cardiovascular complication will be avoided Outcome: Progressing   Problem: Nutrition: Goal: Adequate nutrition will be maintained Outcome: Progressing   Problem: Coping: Goal: Level of anxiety will decrease Outcome: Progressing   Problem: Elimination: Goal: Will not experience complications related to bowel motility Outcome: Progressing Goal: Will not experience complications related to urinary retention Outcome: Progressing   Problem: Pain Managment: Goal: General experience of comfort will improve and/or be controlled Outcome: Progressing   Problem: Safety: Goal: Ability to remain free from injury will improve Outcome: Progressing   Problem: Skin Integrity: Goal: Risk for impaired skin integrity will decrease Outcome: Progressing   Problem: Skin Integrity: Goal: Demonstration of wound healing without infection will improve Outcome: Progressing   Problem: Skin Integrity: Goal: Demonstration of wound healing without infection will improve Outcome: Progressing     Problem: Clinical  Measurements: Goal: Respiratory complications will improve Outcome: Not Progressing   Problem: Activity: Goal: Risk for activity intolerance will decrease Outcome: Not Progressing

## 2024-05-02 NOTE — Progress Notes (Signed)
 NEUROLOGY CONSULT FOLLOW UP NOTE   Date of service: May 02, 2024 Patient Name: Colleen Pearson MRN:  [id number] DOB:  Apr 04, 1991  Interval Hx/subjective   No significant changes  Vitals   Vitals:   05/02/24 1447 05/02/24 1500 05/02/24 1527 05/02/24 1600  BP: (!) 114/59 111/63  108/60  Pulse: (!) 106 (!) 105  (!) 119  Resp: (!) 25 16  18   Temp:   98.8 F (37.1 C)   TempSrc:   Axillary   SpO2:  100%  100%  Weight:      Height:         Body mass index is 31.69 kg/m.  Physical Exam   Intubated in the ICU, on 1 mg/h of Dilaudid  Neurologic Examination    MS: Opens eyes to noxious stimulation, she does orient head towards voice today, she does not follow commands CN: Pupils are reactive bilaterally, eyes are slightly disconjugate, she does not look to either direction, face is symmetric Motor: She moves all extremities spontaneously, but does not follow commands Sensory: She withdraws to noxious stimulation bilaterally  Medications  Current Facility-Administered Medications:    acetaminophen  (TYLENOL ) tablet 650 mg, 650 mg, Per Tube, Q8H PRN, Byrum, Robert S, MD, 650 mg at 05/01/24 2039   Chlorhexidine  Gluconate Cloth 2 % PADS 6 each, 6 each, Topical, Daily, Arloa Folks D, NP, 6 each at 05/01/24 2000   clonazePAM (KLONOPIN) tablet 1 mg, 1 mg, Per Tube, TID, Antonetta Moccasin B, NP, 1 mg at 05/02/24 1541   coconut oil, 1 Application, Topical, PRN, Nicholaus Burnard HERO, MD, 1 Application at 04/28/24 2047   famotidine  (PEPCID ) tablet 20 mg, 20 mg, Per Tube, BID, Maree Harder, MD, 20 mg at 05/02/24 0939   feeding supplement (OSMOLITE 1.5 CAL) liquid 1,000 mL, 1,000 mL, Per Tube, Continuous, Byrum, Robert S, MD, Last Rate: 45 mL/hr at 05/02/24 1600, Infusion Verify at 05/02/24 1600   free water  25 mL, 25 mL, Per Tube, Q6H, Hussein, Lenny, MD, 25 mL at 05/02/24 1224   heparin  injection 5,000 Units, 5,000 Units, Subcutaneous, Q8H, Maree Harder, MD, 5,000 Units at 05/02/24  1340   HYDROmorphone (DILAUDID) injection 1 mg, 1 mg, Intravenous, Q30 min PRN, Antonetta Moccasin B, NP   HYDROmorphone (DILAUDID) injection 1-3 mg, 1-3 mg, Intravenous, Q1H PRN, Antonetta Moccasin B, NP   ipratropium-albuterol  (DUONEB) 0.5-2.5 (3) MG/3ML nebulizer solution 3 mL, 3 mL, Nebulization, Q6H PRN, Hussein, Lenny, MD, 3 mL at 05/02/24 0755   ketamine  (KETALAR ) adult IV infusion 1000mg /174mL (10 mg/mL-Premix), 1.2 mg/kg/hr, Intravenous, Continuous, Antonetta Moccasin B, NP, Last Rate: 8.83 mL/hr at 05/02/24 1600, 1.2 mg/kg/hr at 05/02/24 1600   lamoTRIgine  (LAMICTAL ) tablet 200 mg, 200 mg, Per Tube, BID, Michaela Aisha SQUIBB, MD, 200 mg at 05/02/24 0940   levETIRAcetam  (KEPPRA ) 100 MG/ML solution 1,500 mg, 1,500 mg, Per Tube, BID, Pawar, Rahul, MD   liver oil-zinc oxide (DESITIN) 40 % ointment, , Topical, TID PRN, Sharie Lenny, MD   ondansetron  (ZOFRAN ) injection 4 mg, 4 mg, Intravenous, Q6H PRN, Maree Harder, MD, 4 mg at 04/25/24 1834   Oral care mouth rinse, 15 mL, Mouth Rinse, Q2H, Maree Harder, MD, 15 mL at 05/02/24 1541   Oral care mouth rinse, 15 mL, Mouth Rinse, PRN, Maree Harder, MD, 15 mL at 04/28/24 0540   oxyCODONE  (Oxy IR/ROXICODONE ) immediate release tablet 10 mg, 10 mg, Per Tube, Q6H, Antonetta Moccasin B, NP, 10 mg at 05/02/24 1601   piperacillin -tazobactam (ZOSYN ) IVPB 3.375 g, 3.375 g, Intravenous,  Q8H, Hussein, Lenny, MD, Last Rate: 12.5 mL/hr at 05/02/24 1601, 3.375 g at 05/02/24 1601   polyethylene glycol (MIRALAX  / GLYCOLAX ) packet 17 g, 17 g, Per Tube, BID, Dang, Thuy D, RPH, 17 g at 05/02/24 0940   propofol  (DIPRIVAN ) 1000 MG/100ML infusion, 0-80 mcg/kg/min, Intravenous, Titrated, Pawar, Rahul, MD, Last Rate: 26.5 mL/hr at 05/02/24 1600, 60 mcg/kg/min at 05/02/24 1600   QUEtiapine  (SEROQUEL ) tablet 100 mg, 100 mg, Per Tube, QHS, Hussein, Lenny, MD, 100 mg at 05/01/24 2041   QUEtiapine  (SEROQUEL ) tablet 50 mg, 50 mg, Per Tube, Daily, Antonetta Moccasin B, NP, 50 mg at  05/02/24 9060   senna (SENOKOT) tablet 17.2 mg, 2 tablet, Per Tube, BID, Dang, Thuy D, RPH, 17.2 mg at 05/02/24 0940   sodium chloride  HYPERTONIC 3 % nebulizer solution 4 mL, 4 mL, Nebulization, BID, Hussein, Abdullahi, MD, 4 mL at 05/02/24 0755   [COMPLETED] thiamine (VITAMIN B1) tablet 500 mg, 500 mg, Per Tube, Daily, 500 mg at 05/02/24 0939 **FOLLOWED BY** [START ON 05/03/2024] thiamine (VITAMIN B1) tablet 100 mg, 100 mg, Per Tube, Daily, Hussein, Lenny, MD  Labs and Diagnostic Imaging   Creatinine 0.58    Assessment   Colleen Pearson is a 33 y.o. female with breakthrough seizures in the setting of cocaine/possible medication noncompliance/pregnancy.  She is being gradually weaned from sedation, and I think some of her exam could still be this. We will continue to follow.   Recommendations  Continue Keppra  1500 mg twice daily(increased from home 1000 mg twice daily) Continue lamotrigine  200 mg twice daily(home dose just prior to admission) Continue to wean sedation per CCM  ______________________________________________________________________   Signed, Aisha Seals, MD [facility]

## 2024-05-02 NOTE — Progress Notes (Signed)
 CCM NP at bedside gave verbal order for this RN to turn dilaudid drip down to 1mg /hr to perform wake up assessment.

## 2024-05-02 NOTE — Progress Notes (Signed)
 Pt able to wean on CPAP/PS 5/5 40% from 1110-1310. Pt placed back on full support due to increased work of breathing, HR in the 150's and RR in the 40's.

## 2024-05-02 NOTE — Progress Notes (Signed)
 When this RN was cleaning the patient a golf ball sized clot was noted coming from patient's vagina. This RN called the OB PACU RN who got in touch with the on-call OB MD who states that size clot is to be expected but if clots become grapefruit sized or patient is soaking multiple pads to call the on call OB. Will pass along to night shift RN.

## 2024-05-02 NOTE — Progress Notes (Signed)
 ABG sample obtained but results appear to be mixed. Dr. Pawar given results. No new orders received at this time. RT will continue to be available as needed.

## 2024-05-02 NOTE — Progress Notes (Signed)
 NAME:  Colleen Pearson, MRN:  [id number], DOB:  1991/05/12, LOS: 9 ADMISSION DATE:  04/23/2024, CONSULTATION DATE:  04/22/2024 REFERRING MD:  Oneil Budge, MD, CHIEF COMPLAINT:  Status Epilepticus   History of Present Illness:  33 y/o female with h/o Epilepsy and Polysubstance d/o who is [phone] and is currently [redacted] weeks pregnant who was on the cough and then have several seizures.  EMS gave 4mg  IV Magnesium  and 5mg  IV Midazolam .  She was lethargic with secretions not able to protect her airways and she was intubated in the ED.  She has been started on Versed  drip.  She was loaded with Keppra .  There has been reports of non-compliance with her seizure meds-Keppra  and Lamictal .  She also had low levels as outpatient.  She has seizures in September as well, similar situation and apparently has a seizure two weeks prior to that ED visit.  Pertinent  Medical History  Epilepsy and Polysubstance d/o, CF carrier  Significant Hospital Events: Including procedures, antibiotic start and stop dates in addition to other pertinent events   11/1: transfer to [facility] from [facility] 11/2 admitted to ICU intubated and sedated signs of fetal distress resulting in emergent C-section 11/3 remains intubated deeply sedated on LTM 11/4 sedation lightened > agitation, back to propofol  11/9 fentanyl  gtt changed to dilaudid gtt, cEEG stopped, MRI no acute abnormality   Interim History / Subjective:  Dilaudid gtt increased from 1> 4 mg/hr overnight due to severe agitation  Objective    Blood pressure (!) 112/57, pulse (!) 103, temperature 98.9 F (37.2 C), temperature source Axillary, resp. rate 10, height 5' (1.524 m), weight 73.6 kg, SpO2 99%, unknown if currently breastfeeding.    Vent Mode: PRVC FiO2 (%):  [40 %] 40 % Set Rate:  [18 bmp] 18 bmp Vt Set:  [380 mL] 380 mL PEEP:  [5 cmH20] 5 cmH20 Pressure Support:  [5 cmH20] 5 cmH20 Plateau Pressure:  [13 cmH20-24 cmH20] 13 cmH20   Intake/Output Summary (Last 24  hours) at 05/02/2024 0739 Last data filed at 05/02/2024 0700 Gross per 24 hour  Intake 1646.72 ml  Output 2800 ml  Net -1153.28 ml   Filed Weights   04/30/24 0435 05/01/24 0410 05/02/24 0415  Weight: 73 kg 74 kg 73.6 kg    Examination:  Dilaudid 4mg > 1 General:  critically ill adult female lying in bed in NAD HEENT: MM pink/moist, ETT/ OGT, pupils 3/r, anicteric Neuro: opens eyes to noxious stimuli but does not track or f/c, MAE spont, bilateral wrist restraints CV: rr, NSR, no murmur PULM:  MV supported, coarse few scattered rhonchi and wheeze more on right, thick tan secretions, apneic on SBT GI: soft, bs+, ND, purwick, some residual menses, lower abd incision with intact steri strips  Extremities: warm/dry, no LE edema, mild dependent edema in BUE Skin: no rashes  100.1 tmax  UOP 2.8L -1.1L Stool x 1 Net +5.8L  CBG 77-107 Labs> K 4.6, sCr 0.53, WBC 17.9> 16.4, H/H 8.6/ 26.7   Resolved problem list  Hyperkalemia Shock Lactic acidosis  Assessment and Plan   Sz disorder w status epilepticus Medication non compliance  Substance use disorder  Multifactorial encephalopathy, agitated delirium, ?withdrawal - EEG negative for seizure activity; cEEG d/c'd 11/9, repeat MRI 11/9 no obvious signs of acute abnormality, CTH 11/10 neg.  Ammonia  neg P:  - appreciate Neurology assistance - AEDs per neurology > keppra , lamictal   - await lamotrigine  level  - seizure precautions - maintain neuro protective measures; goal  for eurothermia, euglycemia, eunatermia, normoxia, and PCO2 goal of 35-40 - change clonzaepam to 1mg  TID, seroquel  50mg  am and 100mg  at bedtime, enteral oxy 5mg  q6hrs prn, tylenol  prn, and changing over to precedex , stop dilaudid to hopefully wean/ extubate - substance abuse counseling as appropriate   S/p emergent C-section at 32 weeks-for fetal distress P: - per OB - monitor for increased uterine bleeding/ trend CBC  Acute hypoxic and hypercarbic  respiratory failure Aspiration PNA - completed unasyn 11/6, new temp, leukocytosis and change in secretions 11/10, started on zosyn  P: - agitation remains barrier to extubation.  Weaned well yesterday.  Apneic initially this morning on dilaudid gtt - cont full MV support, daily assessment for extubation  - changing sedation> d/c diludid, add precedex , cont enteral sedation as above w/ scheduled bowel regimen - intermittent CXR - follow trach asp - cont zosyn  day 2/x - follow fever curve/ WBC - ongoing pulm hygiene   Acute renal failure, improved Hyperkalemia, resolved Hypernatremia P: - cont FWF - stable UOP/ renal indices with purwick - trend renal indices  - strict I/Os, daily wts - avoid nephrotoxins, renal dose meds, hemodynamic support as above  Acute blood loss anemia, post-op - trending CBC - transfuse per protocol   At risk for malnutrition  - EN per RD     CRITICAL CARE Performed by: Lyle Pesa   Total critical care time: 39 minutes  Critical care time was exclusive of separately billable procedures and treating other patients.  Critical care was necessary to treat or prevent imminent or life-threatening deterioration.  Critical care was time spent personally by me on the following activities: development of treatment plan with patient and/or surrogate as well as nursing, discussions with consultants, evaluation of patient's response to treatment, examination of patient, obtaining history from patient or surrogate, ordering and performing treatments and interventions, ordering and review of laboratory studies, ordering and review of radiographic studies, pulse oximetry and re-evaluation of patient's condition.    Lyle Pesa, NP [facility] 05/02/2024, 7:40 AM  See Amion for pager If no response to pager , please call [phone] until 7pm After 7:00 pm call Elink  [phone]

## 2024-05-03 ENCOUNTER — Inpatient Hospital Stay (HOSPITAL_COMMUNITY)

## 2024-05-03 DIAGNOSIS — G9340 Encephalopathy, unspecified: Secondary | ICD-10-CM

## 2024-05-03 DIAGNOSIS — Z98891 History of uterine scar from previous surgery: Secondary | ICD-10-CM

## 2024-05-03 LAB — BASIC METABOLIC PANEL WITH GFR
Anion gap: 12 (ref 5–15)
BUN: 17 mg/dL (ref 6–20)
CO2: 29 mmol/L (ref 22–32)
Calcium: 8.2 mg/dL — ABNORMAL LOW (ref 8.9–10.3)
Chloride: 103 mmol/L (ref 98–111)
Creatinine, Ser: 0.86 mg/dL (ref 0.44–1.00)
GFR, Estimated: >60 mL/min (ref >60)
Glucose, Bld: 121 mg/dL — ABNORMAL HIGH (ref 70–99)
Potassium: 4.5 mmol/L (ref 3.5–5.1)
Sodium: 144 mmol/L (ref 135–145)

## 2024-05-03 LAB — GLUCOSE, CAPILLARY
Glucose-Capillary: 120 mg/dL — ABNORMAL HIGH (ref 70–99)
Glucose-Capillary: 94 mg/dL (ref 70–99)
Glucose-Capillary: 110 mg/dL — ABNORMAL HIGH (ref 70–99)
Glucose-Capillary: 113 mg/dL — ABNORMAL HIGH (ref 70–99)
Glucose-Capillary: 110 mg/dL — ABNORMAL HIGH (ref 70–99)
Glucose-Capillary: 94 mg/dL (ref 70–99)

## 2024-05-03 LAB — CBC
HCT: 29.8 % — ABNORMAL LOW (ref 36.0–46.0)
HCT: 32.6 % — ABNORMAL LOW (ref 36.0–46.0)
Hemoglobin: 9.5 g/dL — ABNORMAL LOW (ref 12.0–15.0)
Hemoglobin: 10.6 g/dL — ABNORMAL LOW (ref 12.0–15.0)
MCH: 28.4 pg (ref 26.0–34.0)
MCH: 28.7 pg (ref 26.0–34.0)
MCHC: 31.9 g/dL (ref 30.0–36.0)
MCHC: 32.5 g/dL (ref 30.0–36.0)
MCV: 89 fL (ref 80.0–100.0)
MCV: 88.3 fL (ref 80.0–100.0)
Platelets: 531 K/uL — ABNORMAL HIGH (ref 150–400)
Platelets: 616 K/uL — ABNORMAL HIGH (ref 150–400)
RBC: 3.35 MIL/uL — ABNORMAL LOW (ref 3.87–5.11)
RBC: 3.69 MIL/uL — ABNORMAL LOW (ref 3.87–5.11)
RDW: 14.5 % (ref 11.5–15.5)
RDW: 14.5 % (ref 11.5–15.5)
WBC: 11.9 K/uL — ABNORMAL HIGH (ref 4.0–10.5)
WBC: 14.3 K/uL — ABNORMAL HIGH (ref 4.0–10.5)
nRBC: 0 % (ref 0.0–0.2)
nRBC: 0 % (ref 0.0–0.2)

## 2024-05-03 LAB — TRIGLYCERIDES: Triglycerides: 217 mg/dL — ABNORMAL HIGH (ref <150)

## 2024-05-03 MED ORDER — FENTANYL 2500MCG IN NS 250ML (10MCG/ML) PREMIX INFUSION
0.0000 ug/h | INTRAVENOUS | Status: DC
  Administered 2024-05-03: 50 ug/h via INTRAVENOUS
  Administered 2024-05-04: 200 ug/h via INTRAVENOUS
  Filled 2024-05-03 (×2): qty 250

## 2024-05-03 MED ORDER — MIDAZOLAM HCL (PF) 5 MG/ML IJ SOLN: 2.0000 mg | Freq: Once | INTRAMUSCULAR | Status: DC

## 2024-05-03 MED ORDER — VANCOMYCIN HCL 1500 MG/300ML IV SOLN
1500.0000 mg | Freq: Once | INTRAVENOUS | Status: AC
  Administered 2024-05-03: 1500 mg via INTRAVENOUS
  Filled 2024-05-03: qty 300

## 2024-05-03 MED ORDER — MIDAZOLAM HCL 2 MG/2ML IJ SOLN
INTRAMUSCULAR | Status: AC
  Administered 2024-05-03: 2 mg via INTRAVENOUS
  Filled 2024-05-03: qty 2

## 2024-05-03 MED ORDER — VANCOMYCIN HCL 1500 MG/300ML IV SOLN
1500.0000 mg | Freq: Once | INTRAVENOUS | Status: DC
  Filled 2024-05-03: qty 300

## 2024-05-03 MED ORDER — VANCOMYCIN HCL 10 G IV SOLR
1500.0000 mg | Freq: Once | INTRAVENOUS | Status: DC
  Filled 2024-05-03: qty 15

## 2024-05-03 MED ORDER — MIDAZOLAM HCL (PF) 2 MG/2ML IJ SOLN: 2.0000 mg | Freq: Once | INTRAMUSCULAR | Status: AC

## 2024-05-03 MED ORDER — FENTANYL BOLUS VIA INFUSION
25.0000 ug | INTRAVENOUS | Status: DC | PRN
  Administered 2024-05-03: 50 ug via INTRAVENOUS
  Administered 2024-05-03: 100 ug via INTRAVENOUS
  Administered 2024-05-03: 50 ug via INTRAVENOUS
  Administered 2024-05-04 (×5): 100 ug via INTRAVENOUS

## 2024-05-03 MED ORDER — VANCOMYCIN HCL 1500 MG/300ML IV SOLN
1500.0000 mg | INTRAVENOUS | Status: DC
  Administered 2024-05-04: 1500 mg via INTRAVENOUS
  Filled 2024-05-03: qty 300

## 2024-05-03 MED ORDER — FENTANYL CITRATE (PF) 50 MCG/ML IJ SOSY
25.0000 ug | PREFILLED_SYRINGE | Freq: Once | INTRAMUSCULAR | Status: AC
  Administered 2024-05-03: 50 ug via INTRAVENOUS

## 2024-05-03 MED ORDER — FUROSEMIDE 10 MG/ML IJ SOLN
40.0000 mg | Freq: Once | INTRAMUSCULAR | Status: AC
  Administered 2024-05-03: 40 mg via INTRAVENOUS
  Filled 2024-05-03: qty 4

## 2024-05-03 MED ORDER — QUETIAPINE FUMARATE 100 MG PO TABS
100.0000 mg | ORAL_TABLET | Freq: Two times a day (BID) | ORAL | Status: DC
  Administered 2024-05-03: 100 mg
  Filled 2024-05-03: qty 1

## 2024-05-03 NOTE — Progress Notes (Signed)
 NEUROLOGY CONSULT FOLLOW UP NOTE   Date of service: May 03, 2024 Patient Name: Colleen Pearson MRN:  [id number] DOB:  1991-04-03  Interval Hx/subjective   No significant changes  Vitals   Vitals:   05/03/24 1430 05/03/24 1500 05/03/24 1530 05/03/24 1540  BP: 130/76 139/70 (!) 143/86 (!) 143/56  Pulse: (!) 138 (!) 140 (!) 136   Resp: (!) 24 (!) 37 (!) 30 (!) 39  Temp:  100 F (37.8 C)    TempSrc:  Axillary    SpO2: 100% 100% 99%   Weight:      Height:         Body mass index is 27.99 kg/m.  Physical Exam   Intubated in the ICU, on 1 mg/h of Dilaudid  Neurologic Examination    MS: Opens eyes to noxious stimulation, she does orient head towards voice she does not follow commands CN: Pupils are reactive bilaterally, eyes are slightly disconjugate, she does not look to either direction, face is symmetric Motor: She moves all extremities spontaneously, but does not follow commands Sensory: She withdraws to noxious stimulation bilaterally  Medications  Current Facility-Administered Medications:    acetaminophen  (TYLENOL ) tablet 650 mg, 650 mg, Per Tube, Q8H PRN, Byrum, Robert S, MD, 650 mg at 05/01/24 2039   Chlorhexidine  Gluconate Cloth 2 % PADS 6 each, 6 each, Topical, Daily, Arloa Folks D, NP, 6 each at 05/02/24 2300   coconut oil, 1 Application, Topical, PRN, Nicholaus Burnard HERO, MD, 1 Application at 04/28/24 2047   famotidine  (PEPCID ) tablet 20 mg, 20 mg, Per Tube, BID, Maree Harder, MD, 20 mg at 05/03/24 1014   feeding supplement (OSMOLITE 1.5 CAL) liquid 1,000 mL, 1,000 mL, Per Tube, Continuous, Byrum, Robert S, MD, Last Rate: 45 mL/hr at 05/03/24 1500, Infusion Verify at 05/03/24 1500   free water  25 mL, 25 mL, Per Tube, Q6H, Hussein, Abdullahi, MD, 25 mL at 05/03/24 1128   heparin  injection 5,000 Units, 5,000 Units, Subcutaneous, Q8H, Maree Harder, MD, 5,000 Units at 05/03/24 1326   HYDROmorphone (DILAUDID) injection 1-3 mg, 1-3 mg, Intravenous, Q1H PRN,  Antonetta Moccasin B, NP   ipratropium-albuterol  (DUONEB) 0.5-2.5 (3) MG/3ML nebulizer solution 3 mL, 3 mL, Nebulization, Q6H PRN, Sharie Bourbon, MD, 3 mL at 05/02/24 0755   ketamine  (KETALAR ) adult IV infusion 1000mg /161mL (10 mg/mL-Premix), 1.2 mg/kg/hr, Intravenous, Continuous, Antonetta Moccasin B, NP, Last Rate: 8.83 mL/hr at 05/03/24 1500, 1.2 mg/kg/hr at 05/03/24 1500   lamoTRIgine  (LAMICTAL ) tablet 200 mg, 200 mg, Per Tube, BID, Michaela Aisha SQUIBB, MD, 200 mg at 05/03/24 1024   levETIRAcetam  (KEPPRA ) 100 MG/ML solution 1,500 mg, 1,500 mg, Per Tube, BID, Pawar, Rahul, MD, 1,500 mg at 05/03/24 1014   liver oil-zinc oxide (DESITIN) 40 % ointment, , Topical, TID PRN, Sharie Bourbon, MD   ondansetron  (ZOFRAN ) injection 4 mg, 4 mg, Intravenous, Q6H PRN, Maree Harder, MD, 4 mg at 04/25/24 1834   Oral care mouth rinse, 15 mL, Mouth Rinse, Q2H, Maree Harder, MD, 15 mL at 05/03/24 1326   Oral care mouth rinse, 15 mL, Mouth Rinse, PRN, Maree Harder, MD, 15 mL at 04/28/24 0540   piperacillin -tazobactam (ZOSYN ) IVPB 3.375 g, 3.375 g, Intravenous, Q8H, Hussein, Abdullahi, MD, Last Rate: 12.5 mL/hr at 05/03/24 1500, Infusion Verify at 05/03/24 1500   polyethylene glycol (MIRALAX  / GLYCOLAX ) packet 17 g, 17 g, Per Tube, BID, Dang, Thuy D, RPH, 17 g at 05/03/24 1015   propofol  (DIPRIVAN ) 1000 MG/100ML infusion, 0-80 mcg/kg/min, Intravenous, Titrated, Pawar, Rahul, MD,  Last Rate: 8.83 mL/hr at 05/03/24 1500, 20 mcg/kg/min at 05/03/24 1500   senna (SENOKOT) tablet 17.2 mg, 2 tablet, Per Tube, BID, Dang, Thuy D, RPH, 17.2 mg at 05/03/24 1015   sodium chloride  HYPERTONIC 3 % nebulizer solution 4 mL, 4 mL, Nebulization, BID, Hussein, Abdullahi, MD, 4 mL at 05/02/24 1959   [COMPLETED] thiamine (VITAMIN B1) tablet 500 mg, 500 mg, Per Tube, Daily, 500 mg at 05/02/24 0939 **FOLLOWED BY** thiamine (VITAMIN B1) tablet 100 mg, 100 mg, Per Tube, Daily, Hussein, Lenny, MD, 100 mg at 05/03/24 1015  Labs and  Diagnostic Imaging   Creatinine 0.58    Assessment   Colleen Pearson is a 33 y.o. female with breakthrough seizures in the setting of cocaine/possible medication noncompliance/pregnancy.  She is being gradually weaned from sedation, and I think some of her exam could still be this. We will continue to follow.   Recommendations  Continue Keppra  1500 mg twice daily(increased from home 1000 mg twice daily) Continue lamotrigine  200 mg twice daily(home dose just prior to admission) Continue to wean sedation per CCM  ______________________________________________________________________   Signed, Aisha Seals, MD [facility]

## 2024-05-03 NOTE — Progress Notes (Addendum)
 NAME:  Colleen Pearson, MRN:  [id number], DOB:  20-Dec-1990, LOS: 10 ADMISSION DATE:  04/23/2024, CONSULTATION DATE:  04/22/2024 REFERRING MD:  Oneil Budge, MD, CHIEF COMPLAINT:  Status Epilepticus   History of Present Illness:  33 y/o female with h/o Epilepsy and Polysubstance d/o who is [phone] and is currently [redacted] weeks pregnant who was on the cough and then have several seizures.  EMS gave 4mg  IV Magnesium  and 5mg  IV Midazolam .  She was lethargic with secretions not able to protect her airways and she was intubated in the ED.  She has been started on Versed  drip.  She was loaded with Keppra .  There has been reports of non-compliance with her seizure meds-Keppra  and Lamictal .  She also had low levels as outpatient.  She has seizures in September as well, similar situation and apparently has a seizure two weeks prior to that ED visit.  Pertinent  Medical History  Epilepsy and Polysubstance d/o, CF carrier  Significant Hospital Events: Including procedures, antibiotic start and stop dates in addition to other pertinent events   11/1: transfer to [facility] from [facility] 11/2 admitted to ICU intubated and sedated signs of fetal distress resulting in emergent C-section 11/3 remains intubated deeply sedated on LTM 11/4 sedation lightened > agitation, back to propofol  11/9 fentanyl  gtt changed to dilaudid gtt, cEEG stopped, MRI no acute abnormality  11/12 Patient failed wean yesterday- having to be placed on ketamine , propofol  gtt   Interim History / Subjective:  No significant events overnight No blood clots passed since yesterday afternoon on 11/11   Objective    Blood pressure 125/71, pulse (!) 118, temperature 97.9 F (36.6 C), temperature source Axillary, resp. rate (!) 25, height 5' (1.524 m), weight 65 kg, SpO2 97%, unknown if currently breastfeeding.    Vent Mode: PRVC FiO2 (%):  [40 %] 40 % Set Rate:  [18 bmp] 18 bmp Vt Set:  [380 mL] 380 mL PEEP:  [5 cmH20] 5 cmH20 Pressure Support:  [5  cmH20] 5 cmH20 Plateau Pressure:  [19 cmH20-26 cmH20] 26 cmH20   Intake/Output Summary (Last 24 hours) at 05/03/2024 0725 Last data filed at 05/03/2024 0600 Gross per 24 hour  Intake 1784.89 ml  Output 4150 ml  Net -2365.11 ml   Filed Weights   05/01/24 0410 05/02/24 0415 05/03/24 0500  Weight: 74 kg 73.6 kg 65 kg    Examination:  General: acute critically ill female, lying in icu bed on vent, agitated  HEENT: Normocephalic, PERRLA intact, ETT, OG, Pink MM CV: s1,s2, RRR, no MRG, No JVD  pulm: clear, diminished, no distress on vent during full support Abs: bs active, soft, steri strips stable lower abdomen   Extremities: generalized edema, moves extremities to pain but not to commands  Skin: no rash  Neuro: Rass 1 to -1, responds to painful stimuli, cough gag reflex present  GU: purewick    Resolved problem list  Hyperkalemia Shock Lactic acidosis  Assessment and Plan   Sz disorder w status epilepticus Medication non compliance  Substance use disorder  Multifactorial encephalopathy, agitated delirium, ?withdrawal - EEG negative for seizure activity; cEEG d/c'd 11/9, repeat MRI 11/9 no obvious signs of acute abnormality, CTH 11/10 neg.  Ammonia  neg on 11/8  P:  Continue keppra , lamictal  per Neuro recs, appreciate neuro asssitance  Continue neuroprotective measures: eurothermia, euglycemia, eunatremia, normoxia, normocapnia Continue clonazepam to 1mg  TID, increase seroquel  to 100mg  in morning and HS Continue enteral oxy 5mg  q 6hrs Unfortunately did not tolerate weaning  or SBT on 11/11, having to change precedex  to propofol , with addition of ketamine  gtt  Currently patient only tolerating an hour of SBT, still requiring high amounts of sedation  Repeat Chest X-ray   S/p emergent C-section at 32 weeks-for fetal distress P: OB following, reach out to OB in regards to steri strip removal s/p c-section  Continue to monitor for bleeding, and passing of blood clots   Acute  hypoxic and hypercarbic respiratory failure Aspiration PNA - completed unasyn 11/6, new temp, leukocytosis and change in secretions 11/10, started on zosyn   agitation remains barrier to extubation, WBC and fever improving with initiation of zosyn  P:  Continue ventilator support and lung protective strategies  Continue LTVV  Wean PEEP and Fio2 requirements to sat goal of >92%  HOB > 30 degrees Plat < 30  Aim for Driving pressures < 15  Intermittent Chest X-ray and ABGS Obtain and follow cultures-tracheal aspirate VAP and PAD protocols in place  Wean sedation as tolerated, SBT and WUA daily   -Attempt to wean off propofol  gtt, can continue ketamine  for now  Adjusted enteral medications- see above   Acute renal failure, improved Given 40mg  of Lasix  on 11/11- with adequate urine output > 2 L, now 3.6 L positive  P: Continue to trend renal function daily  Continue to monitor and optimize electrolytes daily Continue to monitor urine output Continue strict I/Os Continue Adequate renal perfusion  Avoid nephrotoxic agents  Give an additional 40mg  of IV lasix    Acute blood loss anemia, post-op Hgb stable, did pass large clot yesterday afternoon on 11/11 OB aware P: Continue to monitor for signs of bleeding Trend h/h per CBC daily   At risk for malnutrition  P: Continue tube feeds RD following, appreciate assistance   CC Time: 50 mins    Christian Jatoya Armbrister AGACNP-BC   [facility] 05/03/2024, 9:23 AM  Please see [email or web address] for pager details.  From 7A-7P if no response, please call [phone]. After hours, please call ELink [phone].

## 2024-05-03 NOTE — Progress Notes (Signed)
 Pharmacy Antibiotic Note  Colleen Pearson is a 33 y.o. female admitted on 04/23/2024 with concern for pneumonia.  Pt has been on therapy for aspiration pneumonia with zosyn . Now consulted to broaden therapy. Pharmacy has been consulted for vancomycin  dosing.  Plan: Vancomycin  1500mg  q24h (eAUC 535, sCr 0.86) F/u renal function, infectious work up and length of therapy Vancomycin  level as needed   Height: 5' (152.4 cm) Weight: 65 kg (143 lb 4.8 oz) IBW/kg (Calculated) : 45.5  Temp (24hrs), Avg:98.9 F (37.2 C), Min:97.9 F (36.6 C), Max:100 F (37.8 C)  Recent Labs  Lab 04/29/24 0512 04/30/24 0829 05/02/24 0137 05/02/24 1803 05/03/24 0331 05/03/24 1537  WBC  --  17.9* 16.4* 19.7* 11.9* 14.3*  CREATININE 0.68 0.58 0.53 0.83 0.86  --     Estimated Creatinine Clearance: 78.3 mL/min (by C-G formula based on SCr of 0.86 mg/dL).    Allergies  Allergen Reactions   Penicillins Hives and Swelling    Tolerated Zosyn  02/24/23   Ultram [Tramadol] Hives and Swelling    Antimicrobials this admission: Unasyn 11/2 >> 11/6 Clindamycin  11/2 x1 OR Gentamicin  11/2 x1 OR Zosyn  11/10 >> Vancomycin  11/12 >  Microbiology results: 11/2 MRSA PCR - negative 11/10 TA -  11/11 resp panel PCR - negative  Thank you for allowing pharmacy to be a part of this patient's care.  Leonor GORMAN Bash 05/03/2024 4:44 PM

## 2024-05-03 NOTE — Procedures (Signed)
 Cortrak  Person Inserting Tube:  Elihue Josette RAMAN, RD Tube Type:  Cortrak - 43 inches Tube Size:  10 Tube Location:  Left nare Secured by: Bridle Initial Placement:  Gastric Technique Used to Measure Tube Placement:  Marking at nare/corner of mouth Cortrak Secured At:  69 cm Initial Placement Verification:  Cortrak device (Registered Dieticians Only)  Cortrak Tube Team Note:  Consult received to place a Cortrak feeding tube.   No x-ray is required. RN may begin using tube.   If the tube becomes dislodged please keep the tube and contact the Cortrak team at [email or web address] for replacement.  If after hours and replacement cannot be delayed, place a NG tube and confirm placement with an abdominal x-ray.    Josette Elihue, MS, RDN, LDN Clinical Dietitian I Please reach out via secure chat

## 2024-05-04 LAB — BASIC METABOLIC PANEL WITH GFR
Anion gap: 12 (ref 5–15)
BUN: 22 mg/dL — ABNORMAL HIGH (ref 6–20)
CO2: 25 mmol/L (ref 22–32)
Calcium: 8.3 mg/dL — ABNORMAL LOW (ref 8.9–10.3)
Chloride: 103 mmol/L (ref 98–111)
Creatinine, Ser: 0.79 mg/dL (ref 0.44–1.00)
GFR, Estimated: >60 mL/min (ref >60)
Glucose, Bld: 117 mg/dL — ABNORMAL HIGH (ref 70–99)
Potassium: 4.4 mmol/L (ref 3.5–5.1)
Sodium: 140 mmol/L (ref 135–145)

## 2024-05-04 LAB — CBC
HCT: 30.8 % — ABNORMAL LOW (ref 36.0–46.0)
Hemoglobin: 9.8 g/dL — ABNORMAL LOW (ref 12.0–15.0)
MCH: 28.4 pg (ref 26.0–34.0)
MCHC: 31.8 g/dL (ref 30.0–36.0)
MCV: 89.3 fL (ref 80.0–100.0)
Platelets: 564 K/uL — ABNORMAL HIGH (ref 150–400)
RBC: 3.45 MIL/uL — ABNORMAL LOW (ref 3.87–5.11)
RDW: 14.2 % (ref 11.5–15.5)
WBC: 12.5 K/uL — ABNORMAL HIGH (ref 4.0–10.5)
nRBC: 0 % (ref 0.0–0.2)

## 2024-05-04 LAB — GLUCOSE, CAPILLARY
Glucose-Capillary: 97 mg/dL (ref 70–99)
Glucose-Capillary: 123 mg/dL — ABNORMAL HIGH (ref 70–99)
Glucose-Capillary: 142 mg/dL — ABNORMAL HIGH (ref 70–99)
Glucose-Capillary: 148 mg/dL — ABNORMAL HIGH (ref 70–99)
Glucose-Capillary: 125 mg/dL — ABNORMAL HIGH (ref 70–99)
Glucose-Capillary: 132 mg/dL — ABNORMAL HIGH (ref 70–99)

## 2024-05-04 LAB — CULTURE, RESPIRATORY W GRAM STAIN
Culture: NORMAL
Gram Stain: NONE SEEN

## 2024-05-04 MED ORDER — HALOPERIDOL LACTATE 5 MG/ML IJ SOLN
2.0000 mg | Freq: Once | INTRAMUSCULAR | Status: AC
  Administered 2024-05-04: 2 mg via INTRAVENOUS
  Filled 2024-05-04: qty 1

## 2024-05-04 MED ORDER — DEXMEDETOMIDINE HCL IN NACL 400 MCG/100ML IV SOLN
0.0000 ug/kg/h | INTRAVENOUS | Status: DC
  Administered 2024-05-05: 1.8 ug/kg/h via INTRAVENOUS
  Filled 2024-05-04: qty 100
  Filled 2024-05-04: qty 200
  Filled 2024-05-04: qty 100

## 2024-05-04 MED ORDER — DEXMEDETOMIDINE HCL IN NACL 400 MCG/100ML IV SOLN
0.0000 ug/kg/h | INTRAVENOUS | Status: DC
  Administered 2024-05-04: 0.4 ug/kg/h via INTRAVENOUS
  Administered 2024-05-04: 0.9 ug/kg/h via INTRAVENOUS
  Filled 2024-05-04 (×2): qty 100

## 2024-05-04 MED ORDER — MIDAZOLAM HCL (PF) 2 MG/2ML IJ SOLN: 2.0000 mg | Freq: Once | INTRAMUSCULAR | Status: AC

## 2024-05-04 MED ORDER — IPRATROPIUM-ALBUTEROL 0.5-2.5 (3) MG/3ML IN SOLN
3.0000 mL | Freq: Four times a day (QID) | RESPIRATORY_TRACT | Status: DC
  Administered 2024-05-04 – 2024-05-06 (×8): 3 mL via RESPIRATORY_TRACT
  Filled 2024-05-04 (×9): qty 3

## 2024-05-04 MED ORDER — IPRATROPIUM-ALBUTEROL 0.5-2.5 (3) MG/3ML IN SOLN: 3.0000 mL | Freq: Four times a day (QID) | RESPIRATORY_TRACT | Status: DC

## 2024-05-04 MED ORDER — FLUMAZENIL 0.5 MG/5ML IV SOLN
0.2000 mg | Freq: Once | INTRAVENOUS | Status: AC
  Administered 2024-05-05: 0.2 mg via INTRAVENOUS
  Filled 2024-05-04: qty 5

## 2024-05-04 MED ORDER — FUROSEMIDE 10 MG/ML IJ SOLN
40.0000 mg | Freq: Once | INTRAMUSCULAR | Status: AC
  Administered 2024-05-04: 40 mg via INTRAVENOUS
  Filled 2024-05-04: qty 4

## 2024-05-04 MED ORDER — LORAZEPAM 2 MG/ML IJ SOLN
1.0000 mg | Freq: Once | INTRAMUSCULAR | Status: AC
  Administered 2024-05-04: 1 mg via INTRAVENOUS
  Filled 2024-05-04: qty 1

## 2024-05-04 MED ORDER — MIDAZOLAM HCL 2 MG/2ML IJ SOLN
INTRAMUSCULAR | Status: AC
  Administered 2024-05-04: 2 mg via INTRAVENOUS
  Filled 2024-05-04: qty 2

## 2024-05-04 NOTE — Progress Notes (Signed)
 NEUROLOGY CONSULT FOLLOW UP NOTE   Date of service: May 04, 2024 Patient Name: Colleen Pearson MRN:  [id number] DOB:  1990-12-01  Interval Hx/subjective   No significant changes  Vitals   Vitals:   05/04/24 1500 05/04/24 1530 05/04/24 1544 05/04/24 1600  BP: 127/79 111/73  111/60  Pulse: (!) 124 (!) 121  (!) 108  Resp: (!) 34 (!) 31  (!) 23  Temp:   97.9 F (36.6 C)   TempSrc:   Axillary   SpO2: 98% 96%  98%  Weight:      Height:         Body mass index is 27.99 kg/m.  Physical Exam   Recently extubated  Neurologic Examination    MS: She opens eyes and follows commands.  Her voice is very hoarse, recently extubated, but she is able to tell me her name and knows that she is in the hospital, but does not answer orientation. 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  Sensory: She withdraws to noxious stimulation bilaterally  Medications  Current Facility-Administered Medications:    acetaminophen  (TYLENOL ) tablet 650 mg, 650 mg, Per Tube, Q8H PRN, Byrum, Robert S, MD, 650 mg at 05/04/24 1542   Chlorhexidine  Gluconate Cloth 2 % PADS 6 each, 6 each, Topical, Daily, Arloa Folks D, NP, 6 each at 05/04/24 0800   coconut oil, 1 Application, Topical, PRN, Nicholaus Burnard HERO, MD, 1 Application at 04/28/24 2047   dexmedetomidine  (PRECEDEX ) 400 MCG/100ML (4 mcg/mL) infusion, 0-1.2 mcg/kg/hr, Intravenous, Titrated, Claudene Chew C, NP, Last Rate: 16.25 mL/hr at 05/04/24 1600, 1 mcg/kg/hr at 05/04/24 1600   famotidine  (PEPCID ) tablet 20 mg, 20 mg, Per Tube, BID, Maree Harder, MD, 20 mg at 05/04/24 0920   feeding supplement (OSMOLITE 1.5 CAL) liquid 1,000 mL, 1,000 mL, Per Tube, Continuous, Shelah Lamar RAMAN, MD, Last Rate: 45 mL/hr at 05/04/24 1600, Infusion Verify at 05/04/24 1600   free water  25 mL, 25 mL, Per Tube, Q6H,  Hussein, Abdullahi, MD, 25 mL at 05/04/24 1105   heparin  injection 5,000 Units, 5,000 Units, Subcutaneous, Q8H, Maree Harder, MD, 5,000 Units at 05/04/24 1336   ipratropium-albuterol  (DUONEB) 0.5-2.5 (3) MG/3ML nebulizer solution 3 mL, 3 mL, Nebulization, Q6H, Claudene Chew C, NP, 3 mL at 05/04/24 1430   lamoTRIgine  (LAMICTAL ) tablet 200 mg, 200 mg, Per Tube, BID, Michaela Aisha SQUIBB, MD, 200 mg at 05/04/24 9078   levETIRAcetam  (KEPPRA ) 100 MG/ML solution 1,500 mg, 1,500 mg, Per Tube, BID, Pawar, Rahul, MD, 1,500 mg at 05/04/24 9078   liver oil-zinc oxide (DESITIN) 40 % ointment, , Topical, TID PRN, Sharie Bourbon, MD   ondansetron  (ZOFRAN ) injection 4 mg, 4 mg, Intravenous, Q6H PRN, Maree Harder, MD, 4 mg at 04/25/24 1834   Oral care mouth rinse, 15 mL, Mouth Rinse, Q2H, Maree Harder, MD, 15 mL at 05/04/24 1518   Oral care mouth rinse, 15 mL, Mouth Rinse, PRN, Maree Harder, MD, 15 mL at 04/28/24 0540   piperacillin -tazobactam (ZOSYN ) IVPB 3.375 g, 3.375 g, Intravenous, Q8H, Hussein, Bourbon, MD, Stopped at 05/04/24 1339   polyethylene glycol (MIRALAX  / GLYCOLAX ) packet 17 g, 17 g, Per Tube, BID, Dang, Thuy D, RPH, 17 g at 05/03/24 2133   senna (SENOKOT) tablet 17.2 mg, 2 tablet, Per Tube, BID, Dang, Thuy D, RPH, 17.2 mg at 05/03/24 2133   [COMPLETED] thiamine (VITAMIN  B1) tablet 500 mg, 500 mg, Per Tube, Daily, 500 mg at 05/02/24 0939 **FOLLOWED BY** thiamine (VITAMIN B1) tablet 100 mg, 100 mg, Per Tube, Daily, Hussein, Abdullahi, MD, 100 mg at 05/04/24 0920   vancomycin  (VANCOREADY) IVPB 1500 mg/300 mL, 1,500 mg, Intravenous, Q24H, Dewald, Dorn NOVAK, MD  Labs and Diagnostic Imaging   Creatinine 0.79    Assessment   Colleen Pearson is a 33 y.o. female with breakthrough seizures in the setting of cocaine/possible medication noncompliance/pregnancy.  She has been weaned from sedation, and her exam is improved.  Recommendations  Continue Keppra  1500 mg twice daily(increased from  home 1000 mg twice daily) Continue lamotrigine  200 mg twice daily(home dose just prior to admission)  ______________________________________________________________________   Signed, Aisha Seals, MD [facility]

## 2024-05-04 NOTE — Progress Notes (Signed)
 Patient extubated per MD's order with RT, RN x2 and NP at bedside. Cuff leak heard prior to extubating, no stridor noted post.

## 2024-05-04 NOTE — Progress Notes (Signed)
 NAME:  Colleen Pearson, MRN:  [id number], DOB:  [birth year], LOS: 11 ADMISSION DATE:  04/23/2024, CONSULTATION DATE:  04/22/2024 REFERRING MD:  Oneil Budge, MD, CHIEF COMPLAINT:  Status Epilepticus   History of Present Illness:  33 y/o female with h/o Epilepsy and Polysubstance d/o who is a [phone] and is currently [redacted] weeks pregnant who was on the cough and then have several seizures.  EMS gave 4mg  IV Magnesium  and 5mg  IV Midazolam .  She was lethargic with secretions not able to protect her airways and she was intubated in the ED.  She has been started on Versed  drip.  She was loaded with Keppra .  There has been reports of non-compliance with her seizure meds-Keppra  and Lamictal .  She also had low levels as outpatient.  She has seizures in September as well, similar situation and apparently has a seizure two weeks prior to that ED visit.  Pertinent  Medical History  Epilepsy and Polysubstance d/o, CF carrier  Significant Hospital Events: Including procedures, antibiotic start and stop dates in addition to other pertinent events   11/1: transfer to [facility] from [facility] 11/2 admitted to ICU intubated and sedated signs of fetal distress resulting in emergent C-section 11/3 remains intubated deeply sedated on LTM 11/4 sedation lightened > agitation, back to propofol  11/9 fentanyl  gtt changed to dilaudid gtt, cEEG stopped, MRI no acute abnormality  11/12 Patient failed wean yesterday- having to be placed on ketamine , propofol  gtt  11/13 Plan to extubate patient this morning, still having agitation   Interim History / Subjective:  No major events overnight   Objective    Blood pressure (!) 111/50, pulse (!) 123, temperature 100.1 F (37.8 C), temperature source Axillary, resp. rate (!) 24, height 5' (1.524 m), weight 65 kg, SpO2 100%, unknown if currently breastfeeding.    Vent Mode: PSV;CPAP FiO2 (%):  [40 %] 40 % Set Rate:  [18 bmp] 18 bmp Vt Set:  [380 mL] 380 mL PEEP:  [5 cmH20] 5  cmH20 Pressure Support:  [5 cmH20] 5 cmH20   Intake/Output Summary (Last 24 hours) at 05/04/2024 0759 Last data filed at 05/04/2024 0600 Gross per 24 hour  Intake 2272.7 ml  Output 3350 ml  Net -1077.3 ml   Filed Weights   05/01/24 0410 05/02/24 0415 05/03/24 0500  Weight: 74 kg 73.6 kg 65 kg    Examination:  General: acute on chronic adult female, lying in icu bed on vent, agitated  HEENT: Normocephalic, PERRLA intact, ETT, OG, Pink MM CV: s1,s2, RRR, no MRG, No JVD  pulm: clear, diminished, no distress on vent, tachypnea  Abs: bs active, soft  Extremities: generalized edema, no deformity, moves all extremities on command  Skin: no rash  Neuro: Rass 1, responds to painful stimuli, cough gag reflex present  GU: foley intact    Resolved problem list  Hyperkalemia Shock Lactic acidosis  Assessment and Plan   Sz disorder w status epilepticus Medication non compliance  Substance use disorder  Multifactorial encephalopathy, agitated delirium, ?withdrawal - EEG negative for seizure activity; cEEG d/c'd 11/9, repeat MRI 11/9 no obvious signs of acute abnormality, CTH 11/10 neg.  Ammonia  neg on 11/8  P:  Continue keppra , Lamictal  per Neuro recs, appreciate neuro assistance Discontinue prop, and dc fentanyl   Continue neuroprotective measures: eurothermia, euglycemia, eunatremia, normoxia, normocapnia  Place on precedex   Will extubate patient to [place], patient following commands but agitated.  S/p emergent C-section at 6 weeks-for fetal distress P: OB following, appreciate assistance Continue to monitor  for bleeding, and passing of blood clots.  Steri strips removed yesterday- no complications   Acute hypoxic and hypercarbic respiratory failure Aspiration PNA - completed unasyn 11/6, new temp, leukocytosis and change in secretions 11/10, started on zosyn   agitation remains barrier to extubation, WBC and fever improving with initiation of zosyn  P:  Continue ventilator  support and lung protective strategies  Continue LTVV  Wean PEEP and Fio2 requirements to sat goal of >92%  HOB > 30 degrees Plat < 30  Aim for Driving pressures < 15  Intermittent Chest X-ray and ABGS Obtain and follow cultures tracheal aspirate VAP and PAD protocols in place  Wean sedation as tolerated, SBT and WUA daily   - dc prop, dc fentanyl  gtt  Initiate precedex , will extubate Continue zosyn , vanc added over   Acute renal failure, improved Given 40mg  of Lasix  on 11/11- with adequate urine output > 3L, Now 2.49 Liters Positive  P: Continue to trend renal function daily  Continue to monitor and optimize electrolytes daily Continue to monitor urine output Continue strict I/Os Continue Adequate renal perfusion  Avoid nephrotoxic agents  Will give additional dose of 40mg  of IV lasix    Acute blood loss anemia, post-op Hgb stable, did pass large clot yesterday afternoon on 11/11 OB aware P: Currently no signs of bleeding  Continue to monitor CBC daily   At risk for malnutrition  P: Cortrak placed yesterday  Continue RDs recs  Continue tube feeds for now Patient will need swallowing exam if tolerates okay with extubation    CC Time: 55 mins    Christian Calisa Luckenbaugh AGACNP-BC   [facility] 05/04/2024, 9:05 AM  Please see [email or web address] for pager details.  From 7A-7P if no response, please call [phone]. After hours, please call ELink [phone].

## 2024-05-04 NOTE — Evaluation (Signed)
 Clinical/Bedside Swallow Evaluation Patient Details  Name: CELINES FEMIA MRN: [id number] Date of Birth: 1990-08-30  Today's Date: 05/04/2024 Time: SLP Start Time (ACUTE ONLY): 1630 SLP Stop Time (ACUTE ONLY): 1649 SLP Time Calculation (min) (ACUTE ONLY): 19 min  Past Medical History:  Past Medical History:  Diagnosis Date   Anemia    Asthma    Cocaine use complicating pregnancy in third trimester 08/30/2017   Endometriosis    Nexplanon  in place 11/04/2020   Ovarian cyst    Polysubstance abuse (HCC)    Seizures (HCC)    Status post cesarean section 08/30/2017   Past Surgical History:  Past Surgical History:  Procedure Laterality Date   CESAREAN SECTION N/A 08/30/2017   Procedure: CESAREAN SECTION;  Surgeon: Leonce Garnette BIRCH, MD;  Location: [facility] ORS;  Service: Obstetrics;  Laterality: N/A;   CESAREAN SECTION N/A 04/23/2024   Procedure: CESAREAN DELIVERY;  Surgeon: Izell Harari, MD;  Location: [facility] ORS;  Service: Obstetrics;  Laterality: N/A;   LASER ABLATION/CAUTERIZATION OF ENDOMETRIAL IMPLANTS     HPI:  33 yo female with history of epilepsy and substance use disorder who presented to [facility] 11/1 after having several seizures. Intubated due to reduced secretion management, CXR showed patchy opacities consistent with bilateral bronchopneumonia. ETT 11/1-11/13. S/p emergent C-section at 32 weeks 11/2. UDS + for cocaine and THC. EEG negative for seizure activity, d/c 11/9. MRI negative for acute abnormality. Cortrak placed 11/12. Seen by SLP during admission for seizures s/p 5 day intubation for dysphagia. MBS 03/01/23 showed silent aspiration of thin and nectar thick liquids, recommending a Dys diet with honey thick liquids before being advanced to a regular diet with thin liquids as mentation improved (MBS 03/09/23 also showed improved function with only transient penetration of thin liquids).    Assessment / Plan / Recommendation  Clinical Impression  Recommend she remain NPO  with the exception of ice chips in moderation after oral care. Ice in an NPO pt is meant to provide comfort, promote pharyngeal hygiene, and preserve swallow function. It is not expected to be harmful to patient even if some moisture is aspirated. SLP will f/u to assess readiness for POs vs instrumental testing.   Pt is alert, presenting with decreased safety awareness and inhibition. She exhibits signs of post-extubation dysphagia with a hoarse vocal quality and a cough that lacks force. Oral transit is efficient with all boluses. Hydrophonia and coughing follow ice chips and water  but do not recur with applesauce. She became tachypneic throughout the session and after removing her [place], SpO2 dropped to 89% but quickly returned to >95% once  was replaced. Education was provided and plan was discussed with pt and her aunt.   SLP Visit Diagnosis: Dysphagia, unspecified (R13.10)    Aspiration Risk  Mild aspiration risk    Diet Recommendation NPO;Ice chips PRN after oral care    Medication Administration: Via alternative means    Other  Recommendations Oral Care Recommendations: Oral care QID;Oral care prior to ice chip/H20     Assistance Recommended at Discharge    Functional Status Assessment Patient has had a recent decline in their functional status and demonstrates the ability to make significant improvements in function in a reasonable and predictable amount of time.  Frequency and Duration min 2x/week  2 weeks       Prognosis Prognosis for improved oropharyngeal function: Good Barriers to Reach Goals: Behavior      Swallow Study   General HPI: 33 yo female with history  of epilepsy and substance use disorder who presented to [facility] 11/1 after having several seizures. Intubated due to reduced secretion management, CXR showed patchy opacities consistent with bilateral bronchopneumonia. ETT 11/1-11/13. S/p emergent C-section at 32 weeks 11/2. UDS + for cocaine and THC. EEG negative for  seizure activity, d/c 11/9. MRI negative for acute abnormality. Cortrak placed 11/12. Seen by SLP during admission for seizures s/p 5 day intubation for dysphagia. MBS 03/01/23 showed silent aspiration of thin and nectar thick liquids, recommending a Dys diet with honey thick liquids before being advanced to a regular diet with thin liquids as mentation improved (MBS 03/09/23 also showed improved function with only transient penetration of thin liquids). Type of Study: Bedside Swallow Evaluation Previous Swallow Assessment: see HPI Diet Prior to this Study: NPO;Cortrak/Small bore NG tube Temperature Spikes Noted: No Respiratory Status: Nasal cannula History of Recent Intubation: Yes Total duration of intubation (days): 13 days Date extubated: 05/04/24 Behavior/Cognition: Alert;Impulsive;Distractible;Requires cueing Oral Cavity Assessment: Within Functional Limits Oral Care Completed by SLP: No Oral Cavity - Dentition: Adequate natural dentition Vision: Functional for self-feeding Self-Feeding Abilities: Able to feed self;Needs assist Patient Positioning: Upright in bed Baseline Vocal Quality: Hoarse Volitional Cough: Weak Volitional Swallow: Able to elicit    Oral/Motor/Sensory Function Overall Oral Motor/Sensory Function: Within functional limits   Ice Chips Ice chips: Impaired Presentation: Spoon Pharyngeal Phase Impairments: Wet Vocal Quality;Cough - Immediate   Thin Liquid Thin Liquid: Impaired Presentation: Straw Pharyngeal  Phase Impairments: Wet Vocal Quality;Cough - Immediate    Nectar Thick Nectar Thick Liquid: Not tested   Honey Thick Honey Thick Liquid: Not tested   Puree Puree: Within functional limits Presentation: Spoon   Solid     Solid: Not tested      Damien Blumenthal, M.A., CCC-SLP Speech Language Pathology, Acute Rehabilitation Services  Secure Chat preferred [phone]  05/04/2024,5:14 PM

## 2024-05-04 NOTE — Progress Notes (Signed)
 eLink Physician-Brief Progress Note Patient Name: Colleen Pearson DOB: 12-27-1990 MRN: [id number]   Date of Service  05/04/2024  HPI/Events of Note  Patient agitated and sitting up in bed and insisting on leaving. She is an extremely high fall risk.  eICU Interventions  Haldol  2 mg iv PRN, 2-4 point restraints PRN, Precedex  ceiling raised to 1.8 mcg. I also tried to reason with the patient but it was impossible.        Yoskar Murrillo U Deriana Vanderhoef 05/04/2024, 8:00 PM

## 2024-05-05 ENCOUNTER — Inpatient Hospital Stay (HOSPITAL_COMMUNITY)

## 2024-05-05 LAB — POCT I-STAT 7, (LYTES, BLD GAS, ICA,H+H)
Acid-Base Excess: 4 mmol/L — ABNORMAL HIGH (ref 0.0–2.0)
Bicarbonate: 27.1 mmol/L (ref 20.0–28.0)
Calcium, Ion: 1.19 mmol/L (ref 1.15–1.40)
HCT: 27 % — ABNORMAL LOW (ref 36.0–46.0)
Hemoglobin: 9.2 g/dL — ABNORMAL LOW (ref 12.0–15.0)
O2 Saturation: 98 %
Potassium: 3.9 mmol/L (ref 3.5–5.1)
Sodium: 137 mmol/L (ref 135–145)
TCO2: 28 mmol/L (ref 22–32)
pCO2 arterial: 34.4 mmHg (ref 32–48)
pH, Arterial: 7.505 — ABNORMAL HIGH (ref 7.35–7.45)
pO2, Arterial: 101 mmHg (ref 83–108)

## 2024-05-05 LAB — BASIC METABOLIC PANEL WITH GFR
Anion gap: 16 — ABNORMAL HIGH (ref 5–15)
BUN: 23 mg/dL — ABNORMAL HIGH (ref 6–20)
CO2: 22 mmol/L (ref 22–32)
Calcium: 8.5 mg/dL — ABNORMAL LOW (ref 8.9–10.3)
Chloride: 98 mmol/L (ref 98–111)
Creatinine, Ser: 0.62 mg/dL (ref 0.44–1.00)
GFR, Estimated: >60 mL/min (ref >60)
Glucose, Bld: 114 mg/dL — ABNORMAL HIGH (ref 70–99)
Potassium: 3.7 mmol/L (ref 3.5–5.1)
Sodium: 136 mmol/L (ref 135–145)

## 2024-05-05 LAB — CBC
HCT: 29.3 % — ABNORMAL LOW (ref 36.0–46.0)
Hemoglobin: 9.8 g/dL — ABNORMAL LOW (ref 12.0–15.0)
MCH: 28.2 pg (ref 26.0–34.0)
MCHC: 33.4 g/dL (ref 30.0–36.0)
MCV: 84.4 fL (ref 80.0–100.0)
Platelets: 627 K/uL — ABNORMAL HIGH (ref 150–400)
RBC: 3.47 MIL/uL — ABNORMAL LOW (ref 3.87–5.11)
RDW: 13.4 % (ref 11.5–15.5)
WBC: 12.7 K/uL — ABNORMAL HIGH (ref 4.0–10.5)
nRBC: 0 % (ref 0.0–0.2)

## 2024-05-05 LAB — PHOSPHORUS: Phosphorus: 3 mg/dL (ref 2.5–4.6)

## 2024-05-05 LAB — GLUCOSE, CAPILLARY
Glucose-Capillary: 104 mg/dL — ABNORMAL HIGH (ref 70–99)
Glucose-Capillary: 103 mg/dL — ABNORMAL HIGH (ref 70–99)
Glucose-Capillary: 126 mg/dL — ABNORMAL HIGH (ref 70–99)
Glucose-Capillary: 144 mg/dL — ABNORMAL HIGH (ref 70–99)
Glucose-Capillary: 118 mg/dL — ABNORMAL HIGH (ref 70–99)

## 2024-05-05 LAB — MAGNESIUM: Magnesium: 1.8 mg/dL (ref 1.7–2.4)

## 2024-05-05 MED ORDER — FREE WATER
100.0000 mL | Freq: Four times a day (QID) | Status: DC
  Administered 2024-05-05 – 2024-05-08 (×12): 100 mL

## 2024-05-05 MED ORDER — POLYETHYLENE GLYCOL 3350 17 G PO PACK
17.0000 g | PACK | Freq: Every day | ORAL | Status: DC
  Administered 2024-05-07: 17 g
  Filled 2024-05-05 (×3): qty 1

## 2024-05-05 MED ORDER — SENNA 8.6 MG PO TABS
2.0000 | ORAL_TABLET | Freq: Every day | ORAL | Status: DC
  Administered 2024-05-07: 17.2 mg
  Filled 2024-05-05 (×3): qty 2

## 2024-05-05 MED ORDER — ORAL CARE MOUTH RINSE
15.0000 mL | OROMUCOSAL | Status: DC
  Administered 2024-05-05 – 2024-05-08 (×17): 15 mL via OROMUCOSAL

## 2024-05-05 MED ORDER — LAMOTRIGINE 25 MG PO TABS
150.0000 mg | ORAL_TABLET | Freq: Two times a day (BID) | ORAL | Status: DC
  Administered 2024-05-05 – 2024-05-08 (×7): 150 mg
  Filled 2024-05-05: qty 1
  Filled 2024-05-05: qty 2
  Filled 2024-05-05: qty 1
  Filled 2024-05-05 (×5): qty 2

## 2024-05-05 MED ORDER — ATORVASTATIN CALCIUM 40 MG PO TABS
40.0000 mg | ORAL_TABLET | Freq: Every day | ORAL | Status: DC
  Administered 2024-05-05 – 2024-05-08 (×4): 40 mg via ORAL
  Filled 2024-05-05 (×4): qty 1

## 2024-05-05 MED ORDER — LOPERAMIDE HCL 1 MG/7.5ML PO SUSP: 2.0000 mg | ORAL | Status: DC | PRN

## 2024-05-05 NOTE — TOC Progression Note (Signed)
 Transition of Care [facility] - Progression Note    Patient Details  Name: Colleen Pearson MRN: [id number] Date of Birth: 1991-05-01  Transition of Care [facility]) CM/SW Contact  Tom-Johnson, Pavle Wiler Daphne, RN Phone Number: 05/05/2024, 9:40 AM  Clinical Narrative:     Patient was extubated yesterday 05/04/24, currently on room air, IV abx, Precedex . Has Cortrak for supplemental feeding.  CM unable to assess at this time d/t patient not answering questions appropriately at this time.   Patient not Medically ready for discharge.  CM will continue to follow and assess as patient progresses with care towards discharge.                     Expected Discharge Plan and Services                                               Social Drivers of Health (SDOH) Interventions SDOH Screenings   Food Insecurity: No Food Insecurity (04/27/2024)  Housing: Unknown (04/27/2024)  Transportation Needs: Patient Unable To Answer (04/27/2024)  Utilities: Patient Unable To Answer (04/27/2024)  Alcohol Screen: Low Risk  (12/27/2023)  Depression (PHQ2-9): Low Risk  (12/27/2023)  Financial Resource Strain: Medium Risk (12/27/2023)  Physical Activity: Inactive (12/27/2023)  Social Connections: Unknown (12/27/2023)  Stress: No Stress Concern Present (12/27/2023)  Tobacco Use: Medium Risk (04/23/2024)  Health Literacy: Adequate Health Literacy (12/27/2023)    Readmission Risk Interventions    03/03/2023    3:16 PM  Readmission Risk Prevention Plan  Medication Screening Complete  Transportation Screening Complete

## 2024-05-05 NOTE — Progress Notes (Signed)
 eLink Physician-Brief Progress Note Patient Name: BRIDNEY GUADARRAMA DOB: 1991-03-02 MRN: [id number]   Date of Service  05/05/2024  HPI/Events of Note  ABG result reviewed  eICU Interventions          Lamoine Magallon U Cellie Dardis 05/05/2024, 3:00 AM

## 2024-05-05 NOTE — Progress Notes (Signed)
 Nutrition Follow-up  DOCUMENTATION CODES:  Not applicable  INTERVENTION:  TF via Cortrak: Osmolite 1.5 at 45 ml/h (1080 ml per day) Free water  flushes 100ml q6h per CCM (400ml total daily) Provides 1620 kcal, 68 gm protein, 822 ml free water  daily (1222ml total daily- TF + FWF)  NUTRITION DIAGNOSIS:  Inadequate oral intake related to inability to eat as evidenced by NPO status. - remains applicable  GOAL:   Patient will meet greater than or equal to 90% of their needs - goal met via TF  MONITOR:  Diet advancement, Vent status  REASON FOR ASSESSMENT:  Consult Assessment of nutrition requirement/status  ASSESSMENT:  Pt with hx of epilepsy and polysubstance disorder who underwent emergent C-section at 32 weeks due to several seizures that led to fetal distress. Pt admitted from [facility] and placed on ventilator.  11/1 transfer from [facility] 11/2 admitted to ICU, intubated, emergent C-section  11/11 OGT exchanged for Cortrak 11/13 extubated; SLP recommend NPO, follow up for readiness for PO vs instrumental testing 11/14 s/p MBS- remain NPO  Per Neurology, continues to remain somnolent, though appears better than yesterday.   TF held overnight d/t agitation and pulling at tubes, attempting to leave.  Spoke with RN who reports that tube feeds were resumed this morning.   Attempted to check in with patient at bedside however pt concerned with needing to use the bathroom. Alerted RN.   Speech therapy assessed prior to RD visit. Performed MBS today. Recommend continue NPO d/t post extubation dysphagia and imaging also limited by reduced bolus acceptance and excessive movement.   Admit weight: 70.8 kg (+emergent c-section post admission) Current weight: 65 kg (? Accuracy given significant drop in weight x1 day)  Medications: miralax  BID, senna BID, thiamine, IV abx  Labs:  BUN 23 Anion gap 16 CBG's 103-148 x24 hours HgbA1c 3.8%   Diet Order:   Diet Order             Diet  NPO time specified  Diet effective now                   EDUCATION NEEDS:   Not appropriate for education at this time  Skin:  Skin Assessment: Skin Integrity Issues: Skin Integrity Issues:: Incisions Incisions: abdomen and perineum  Last BM:  11/13 x4 type 6/type 7  Height:   Ht Readings from Last 1 Encounters:  04/28/24 5' (1.524 m)    Weight:   Wt Readings from Last 1 Encounters:  05/03/24 65 kg   BMI:  Body mass index is 27.99 kg/m.  Estimated Nutritional Needs:   Kcal:  1600-1800  Protein:  65-85g  Fluid:  1.6-1.8L  Royce Maris, RDN, LDN Clinical Nutrition See AMiON for contact information.

## 2024-05-05 NOTE — Evaluation (Signed)
 Physical Therapy Evaluation Patient Details Name: Colleen Pearson MRN: [id number] DOB: 03-21-1991 Today's Date: 05/05/2024  History of Present Illness  33 yo female presents to [facility] ED 11/01 with status epilepticus.  This is the 3rd known seizure this pregnancy. Pt unable to protect her airway after suspected aspiration requiring intubation and was transferred to [facility]. Fetal HR declined requiring emergency C-section birth 11/02. Pt extubated 11/13  PMH:[redacted] weeks pregnantseizure disorder and polysubstance abuse.  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 Pt extubated yesterday, needing sedation for agitation overnight and asleep on entry being weaned off of Precedex  . Pt rouses easily, but remains disoriented and lethargic. Pt is min Ax2 to come to EoB and modAx2 for initial standing. Pt requiring maxA, leaning on PT shoulder while pericare performed in standing. Pt returns to bed with supervision. With improved arousal, PT predicts pt will be able to move at a min-modA level and could go home with 24 hour support from family and HHPT. PT will continue to see acutely.      If plan is discharge home, recommend the following: A lot of help with walking and/or transfers;A lot of help with bathing/dressing/bathroom;Assistance with cooking/housework;Assistance with feeding;Direct supervision/assist for medications management;Direct supervision/assist for financial management;Assist for transportation;Help with stairs or ramp for entrance;Supervision due to cognitive status   Can travel by private vehicle    Likely    Equipment Recommendations Rolling walker (2 wheels)     Functional Status Assessment Patient has had a recent decline in their functional status and demonstrates the ability to make significant improvements in function in a reasonable and predictable amount of time.     Precautions / Restrictions Precautions Precautions: Fall Recall of  Precautions/Restrictions: Impaired Restrictions Weight Bearing Restrictions Per Provider Order: No Other Position/Activity Restrictions: C section      Mobility  Bed Mobility Overal bed mobility: Needs Assistance Bed Mobility: Supine to Sit, Sit to Supine     Supine to sit: Min assist Sit to supine: Supervision   General bed mobility comments: min A  and use of bed rail to come to EoB, cues for log rolling and bracing abdomen with pillow to reduce pain with return to bed.    Transfers Overall transfer level: Needs assistance   Transfers: Sit to/from Stand, Bed to chair/wheelchair/BSC Sit to Stand: Mod assist, +2 safety/equipment           General transfer comment: pt able to power up with minA x2 but requires mod to maxAx1 for maintain upright due to knee buckling, pt with head on PT shoulder needing encouragement to stay awake. modA x2 for lateral stepping towards HoB            Pertinent Vitals/Pain Pain Assessment Pain Assessment: Faces Pain Score: 10-Worst pain ever Faces Pain Scale: Hurts even more Pain Location: C-section incision with movement, taught log rolling to decreased pain with movement Pain Descriptors / Indicators: Sharp, Shooting Pain Intervention(s): Limited activity within patient's tolerance, Monitored during session, Repositioned    Home Living Family/patient expects to be discharged to:: Private residence Living Arrangements: Parent Available Help at Discharge: Family Type of Home: House Home Access: Stairs to enter Entrance Stairs-Rails: Can reach both Entrance Stairs-Number of Steps: 4   Home Layout: One level        Prior Function Prior Level of Function : Independent/Modified Independent               ADLs Comments: 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, Hospital Extremity Assessment Upper Extremity Assessment: Defer to OT evaluation    Lower Extremity Assessment Lower Extremity  Assessment: Generalized weakness;Overall [facility] for tasks assessed    Cervical / Trunk Assessment Cervical / Trunk Assessment: Other exceptions (post C-section)  Communication   Communication Communication: Impaired Factors Affecting Communication: Reduced clarity of speech (from 12 intubation)    Cognition Arousal: Lethargic Behavior During Therapy: Flat affect   PT - Cognitive impairments: Difficult to assess, Orientation   Orientation impairments: Place, Time (thinks she is at [employer],)                   PT - Cognition Comments: pt is very sleepy and emotional during session Following commands: Impaired Following commands impaired: Follows one step commands inconsistently     Cueing Cueing Techniques: Verbal cues, Gestural cues, Tactile cues, Other (comments)     General Comments General comments (skin integrity, edema, etc.): Pt is lethargic, and emotional during session, frustrated she can't drink and that she has not seen her baby yet, RN reducing Precedex , hopeful to be able to take pt to nursery this afternoon        Assessment/Plan    PT Assessment Patient needs continued PT services  PT Problem List Decreased strength;Decreased activity tolerance;Decreased balance;Decreased mobility;Decreased skin integrity;Decreased cognition;Decreased coordination       PT Treatment Interventions DME instruction;Gait training;Stair training;Functional mobility training;Therapeutic activities;Therapeutic exercise;Balance training;Cognitive remediation;Patient/family education    PT Goals (Current goals can be found in the Care Plan section)  Acute Rehab PT Goals Patient Stated Goal: see her son PT Goal Formulation: With patient Time For Goal Achievement: 05/19/24 Potential to Achieve Goals: Fair    Frequency Min 3X/week     Co-evaluation PT/OT/SLP Co-Evaluation/Treatment: Yes Reason for Co-Treatment: Necessary to address cognition/behavior during functional  activity;For patient/therapist safety;To address functional/ADL transfers PT goals addressed during session: Mobility/safety with mobility;Balance;Strengthening/ROM OT goals addressed during session: ADL's and self-care;Strengthening/ROM       AM-PAC PT 6 Clicks Mobility  Outcome Measure Help needed turning from your back to your side while in a flat bed without using bedrails?: A Little Help needed moving from lying on your back to sitting on the side of a flat bed without using bedrails?: A Little Help needed moving to and from a bed to a chair (including a wheelchair)?: Total Help needed standing up from a chair using your arms (e.g., wheelchair or bedside chair)?: Total Help needed to walk in hospital room?: Total Help needed climbing 3-5 steps with a railing? : Total 6 Click Score: 10    End of Session   Activity Tolerance: Patient limited by lethargy Patient left: in bed;with call bell/phone within reach;with bed alarm set Nurse Communication: Mobility status PT Visit Diagnosis: Unsteadiness on feet (R26.81);Other abnormalities of gait and mobility (R26.89);Muscle weakness (generalized) (M62.81);Difficulty in walking, not elsewhere classified (R26.2)    Time: 8990-8965 PT Time Calculation (min) (ACUTE ONLY): 25 min   Charges:   PT Evaluation $PT Eval Moderate Complexity: 1 Mod   PT General Charges $$ ACUTE PT VISIT: 1 Visit         Rayen Palen B. Fleeta Lapidus PT, DPT Acute Rehabilitation Services Please use secure chat or  Call Office [phone]   Almarie KATHEE Fleeta Fleet 05/05/2024, 1:47 PM

## 2024-05-05 NOTE — Social Work (Signed)
 CSW contacted RN to determine if the patient is an appropriate mental state to complete a psychosocial assessment with CSW. RN recommended CSW check back later today.   CSW will continue to offer support and resources to family while infant remains in NICU.   Nat Quiet, MSW, LCSW Clinical Social Worker  [phone] 05/05/2024  8:41 AM

## 2024-05-05 NOTE — Progress Notes (Signed)
 eLink Physician-Brief Progress Note Patient Name: CHENA CHOHAN DOB: 08/27/1990 MRN: [id number]   Date of Service  05/05/2024  HPI/Events of Note  Patient again appears quite drowsy.  eICU Interventions  Will check an ABG.        Van Ehlert U Latayna Ritchie 05/05/2024, 1:45 AM

## 2024-05-05 NOTE — Progress Notes (Signed)
 Patient eLink Physician-Brief Progress Note Patient Name: Colleen Pearson DOB: 04/13/1991 MRN: [id number]   Date of Service  05/05/2024  HPI/Events of Note  Patient was somnolent after receiving 1 mg of iv Ativan .  eICU Interventions  Flumazenil 0.2 mg iv x 1 given with resolution of somnolence.        [name] U Lodema Parma 05/05/2024, 12:07 AM

## 2024-05-05 NOTE — Progress Notes (Signed)
 NAME:  Colleen Pearson, MRN:  [id number], DOB:  05/12/1991, LOS: 12 ADMISSION DATE:  04/23/2024, CONSULTATION DATE:  04/22/2024 REFERRING MD:  Oneil Budge, MD, CHIEF COMPLAINT:  Status Epilepticus   History of Present Illness:  33 y/o female with h/o Epilepsy and Polysubstance d/o who is a [phone] and is currently [redacted] weeks pregnant who was on the cough and then have several seizures.  EMS gave 4mg  IV Magnesium  and 5mg  IV Midazolam .  She was lethargic with secretions not able to protect her airways and she was intubated in the ED.  She has been started on Versed  drip.  She was loaded with Keppra .  There has been reports of non-compliance with her seizure meds-Keppra  and Lamictal .  She also had low levels as outpatient.  She has seizures in September as well, similar situation and apparently has a seizure two weeks prior to that ED visit.  Pertinent  Medical History  Epilepsy and Polysubstance d/o, CF carrier  Significant Hospital Events: Including procedures, antibiotic start and stop dates in addition to other pertinent events   11/1: transfer to [facility] from [facility] 11/2 admitted to ICU intubated and sedated signs of fetal distress resulting in emergent C-section 11/3 remains intubated deeply sedated on LTM 11/4 sedation lightened > agitation, back to propofol  11/9 fentanyl  gtt changed to dilaudid gtt, cEEG stopped, MRI no acute abnormality  11/12 Patient failed wean yesterday- having to be placed on ketamine , propofol  gtt  11/13 Plan to extubate patient this morning, still having agitation  11/14 Extubated yesterday, was able to tolerate,   Interim History / Subjective:  Agitation and delirium  Given ativan  1mg , and had to be reversed due to somnolence and decreased respiratory drive   Following commands  Oriented x3, disoriented to time   Impulsive at times   Objective    Blood pressure 107/64, pulse 84, temperature 98.3 F (36.8 C), temperature source Axillary, resp. rate (!) 36,  height 5' (1.524 m), weight 65 kg, SpO2 93%, unknown if currently breastfeeding.    FiO2 (%):  [36 %] 36 %   Intake/Output Summary (Last 24 hours) at 05/05/2024 1038 Last data filed at 05/05/2024 0913 Gross per 24 hour  Intake 1510.31 ml  Output 1050 ml  Net 460.31 ml   Filed Weights   05/01/24 0410 05/02/24 0415 05/03/24 0500  Weight: 74 kg 73.6 kg 65 kg    Examination:  General: acute on chronic young adult female, sitting up ICU bed  HEENT: Normocephalic, PERRLA intact, Pink MM, poor dentition CV: s1,s2, RRR, no MRG, No JVD  pulm: clear, diminished, no distress, on 4L [place] but actually RA because patient does not have [place] in nares, O2 sats  Abs: bs active, soft, surgical incision s/p c-section stable  Extremities: tatoos generalized, moves all extremities on command Skin: no rash  Neuro: Rass 0 GU: intact    Resolved problem list  Hyperkalemia Shock Lactic acidosis  Assessment and Plan   Sz disorder w status epilepticus Medication non compliance  Substance use disorder  Multifactorial encephalopathy, agitated delirium, ?withdrawal- Encephalopathy improving  - EEG negative for seizure activity; cEEG d/c'd 11/9, repeat MRI 11/9 no obvious signs of acute abnormality, CTH 11/10 neg.  Ammonia  neg on 11/8  Extubated on 11/13  P:  Continue keppra , lamictal  per Neuro  Wean off precedex  Continue neuro protective measures: eurothermia, euglycemia, eunatremia, normoxia, normocapnia  Will consult psych to follow, to assist- patient may benefit from antidepressant in future  If able to get off  dex, will transfer out of ICU later today on 11/14   S/p emergent C-section at 32 weeks-for fetal distress Steri strips- lower abdomen removed on 11/12- stable  P: OB following, appreciate assitance Continue to monitor for bleeding, and passing blood clots   Acute hypoxic and hypercarbic respiratory failure Aspiration PNA Extubated on 11/13 - completed unasyn 11/6, new temp,  leukocytosis and change in secretions 11/10, started on zosyn   agitation remains barrier to extubation, WBC and fever improving with initiation of zosyn  ABG on 11/14 respiratory alkalosis P:  Continue IS, continue aggressive pulm hygiene  Continue scheduled nebs Dc Vanc, continue zosyn  for 7 days  Mobilize with PT, up to chair daily  With tachypnea improving, alkalosis will improve  Hold of further lasix  admin   Acute renal failure, improved Given 40mg  of Lasix  on 11/11- with adequate urine output > 3L, Now 2.49 Liters Positive  P: Continue to trend renal function daily  Continue to monitor and optimize electrolytes daily Continue to monitor urine output Continue strict I/Os Continue Adequate renal perfusion  Avoid nephrotoxic agents  Hold off further diuresis for today   Acute blood loss anemia, post-op Hgb stable, did pass large clot yesterday afternoon on 11/11 OB aware P: No signs of bleeding Continue to follow and trend labs- CBC  Hyperlipidemia  LDL 100  P: Start statin- lipitor 40mg    At risk for malnutrition  P: Continue cortrak for now Continue Speech evaluation for swallow eval Continue tube feeds per cortrak    CC Time: 60 mins    Christian Onofrio Klemp AGACNP-BC   [facility] 05/05/2024, 10:38 AM  Please see [email or web address] for pager details.  From 7A-7P if no response, please call [phone]. After hours, please call ELink [phone].

## 2024-05-05 NOTE — Clinical Social Work Maternal (Addendum)
 CLINICAL SOCIAL WORK MATERNAL/CHILD NOTE  Patient Details  Name: Colleen Pearson MRN: [id number] Date of Birth: 07/01/1990  Date:  September 27, 2023  Clinical Social Worker Initiating Note:  Nat Quiet, [place] Date/Time: Initiated:  05/05/24/1540     Child's Name:  Guadlupe Shepard Raddle.   Biological Parents:  Mother, Father (Mother: Horice Carrero 06/23/1990, Father: Durwood Shepard Raddle.)   Need for Interpreter:  None   Reason for Referral:  Parental Support of Premature Babies < 32 weeks/or Critically Ill babies, Current Substance Use/Substance Use During Pregnancy  , Behavioral Health Concerns   Address:  [address] [place] [address]    Phone number:  [phone] (home)     Additional phone number:   Household Members/Support Persons (HM/SP):   Household Member/Support Person 1, Household Member/Support Person 2   HM/SP Name Relationship DOB or Age  HM/SP -1 Laurier Right Father    HM/SP -2 Joshuajames Moehring Daughter 08/30/2017  HM/SP -3        HM/SP -4        HM/SP -5        HM/SP -6        HM/SP -7        HM/SP -8          Natural Supports (not living in the home):  Spouse/significant other   Professional Supports: None   Employment: Full-time   Type of Work: Conservator, Museum/gallery   Education:  [employer] school graduate   Homebound arranged:    Surveyor, Quantity Resources:  Medicaid   Other Resources:      Cultural/Religious Considerations Which May Impact Care:    Strengths:  Compliance with medical plan     Psychotropic Medications:         Pediatrician:       Pediatrician List:   [employer]    [place]    [facility]      Pediatrician Fax Number:    Risk Factors/Current Problems:  Substance Use  , Mental Health Concerns     Cognitive State:  Able to Concentrate  , Alert     Mood/Affect:  Calm  , Interested     CSW Assessment: CSW received consult for NICU admission. CSW met  with MOB to offer support and complete assessment.    CSW met with MOB at bedside and introduced CSW role. MOB was alert and oriented x3. She appeared calm and engaged throughout the assessment. CSW acknowledged that MOB was able to see her infant today. MOB smiled and nodded her head in agreement. MOB reported that she received an update on her infant from NICU staff and was oriented to the NICU. MOB confirmed that her demographic information on hospital file was correct and stated that she lives with father, Yuki Purves, and her daughter Lamarr Galley. CSW inquired about FOB involvement. She identified FOB as Durwood Shepard, and she was unable to recall his date of birth but confirmed that he is listed on the birth certificate. CSW inquired about MOB's support system. MOB identified FOB as her primary source of support. CSW educated MOB about NICU support services and offered to check in with her while the infant remains in the NICU. MOB was receptive to these visits.  CSW inquired about MOB's mental health history. MOB acknowledged that she has a diagnosis for Bipolar I with both manic and depressive symptoms. MOB reported that she could not recall the  last time she received formal treatment for her mental health. She reported that she had taken mental health medication in the past but not recently. MOB reported that her mental health was not a current concern for her. CSW asked if MOB had previously experienced postpartum depression and anxiety symptoms with her older child. MOB denied experiencing symptoms.   CSW provided education regarding the baby blues period vs. perinatal mood disorders, discussed treatment and gave resources for mental health follow up including crisis support, if concerns arise. CSW recommended self-evaluation during the postpartum time period using the New Mom Checklist from Postpartum Progress and encouraged MOB to contact a medical professional if symptoms are noted at  any time. CSW assessed MOB for safety. MOB denied SI/HI and DV concerns.   CSW inquired about MOB's substance use during the pregnancy. MOB disclosed that she used cocaine during the pregnancy and denied using any other substances including THC. She reported the last time that she used cocaine was the week she had an epileptic seizure and was admitted to the hospital. CSW informed MOB about the hospital drug screen policy and informed MOB that infant's UDS was positive for illegal substances and prescribed substances that she received during her care. CSW informed MOB that CSW would make a report to [facility] CPS regarding infant's UDS and CDS results. MOB verbalized understanding. CSW inquired if MOB had CPS history. MOB reported prior CPS history in 2019 with her older daughter for substance use (cocaine). CSW required if MOB was interested in substance use resources. MOB expressed interested and CSW provided MOB with substance use resources and encouraged follow up.  CSW inquired if MOB would be able to purchase essential baby items including a car seat and bassinet. MOB reported that she would be able to purchase the items. CSW inquired if MOB had chosen a pediatrician for the infant. MOB reported that she was still deciding. CSW agreed to provide MOB with a list of pediatricians for reference. CSW inquired if MOB revives WIC and SNAP benefits. MOB reported that was not receiving benefits. CSW assessed MOB for additional needs at time. MOB reported none.   CSW made a report to [facility] CPS regarding infant's UDS and CDS results. Barriers to infant's discharge.   CSW will continue to offer support and resources to family while infant remains in NICU.   CSW Plan/Description:  Perinatal Mood and Anxiety Disorder (PMADs) Education, Sudden Infant Death Syndrome (SIDS) Education, Psychosocial Support and Ongoing Assessment of Needs, No Further Intervention Required/No Barriers to Discharge,  Child Protective Service Report  , Hospital Drug Screen Policy Information    Nat DELENA Quiet, LCSW 17-Apr-2024, 3:45 PM

## 2024-05-05 NOTE — Evaluation (Signed)
 Modified Barium Swallow Study  Patient Details  Name: Colleen Pearson MRN: [id number] Date of Birth: Oct 28, 1990  Today's Date: 05/05/2024  Modified Barium Swallow completed.  Full report located under Chart Review in the Imaging Section.  History of Present Illness 33 yo female with history of epilepsy and substance use disorder who presented to [facility] 11/1 after having several seizures. Intubated due to reduced secretion management, CXR showed patchy opacities consistent with bilateral bronchopneumonia. ETT 11/1-11/13. S/p emergent C-section at 32 weeks 11/2. UDS + for cocaine and THC. EEG negative for seizure activity, d/c 11/9. MRI negative for acute abnormality. Cortrak placed 11/12. Seen by SLP during admission for seizures s/p 5 day intubation for dysphagia. MBS 03/01/23 showed silent aspiration of thin and nectar thick liquids, recommending a Dys diet with honey thick liquids before being advanced to a regular diet with thin liquids as mentation improved (MBS 03/09/23 also showed improved function with only transient penetration of thin liquids).   Clinical Impression Pt has a post-extubation dysphagia with MBS also limited by reduced bolus acceptance and excessive movement. Aspiration with liquids is inconsistently sensed. Recommend that she stay NPO but could offer snacks of purees from the floor stock. Would do this with full supervision for positioning and pacing. Would offer purees OR ice chips (not at the same time), providing oral care first. Hopeful for continued progress with increased time post-extubation.  Oral phase is grossly functional except for slow mastication of solids. Pharyngeally, she has reduced hyolaryngeal movement and laryngeal vestibule closure. Aspiration occurs with thin and nectar thick liquids (PAS 7-8), with additional penetration of honey thick liquids. Some of the aspiration does come back up above her vocal folds, but she cannot clear all of it or all of the  penetration in her laryngeal vestibule. Airway protection and pharyngeal clearance is complete with purees. Pt was unfortunately gagging on the barium taste throughout the study and did not take large or consecutive volumes despite her efforts. Given inconsistent airway protection with liquids in the small volumes, concern for aspiration at bedside is increased given amount of impulsivity observed at bedside.   Factors that may increase risk of adverse event in presence of aspiration Noe & Lianne 2021): Reduced cognitive function;Frail or deconditioned;Weak cough;Presence of tubes (ETT, trach, NG, etc.);Aspiration of thick, dense, and/or acidic materials  Swallow Evaluation Recommendations Recommendations: NPO;Ice chips PRN after oral care (small amounts of ice OR purees from the floor stock) Medication Administration: Via alternative means Supervision: Full supervision/cueing for swallowing strategies Oral care recommendations: Oral care QID (4x/day);Oral care before ice chips/water       Leita SAILOR., M.A. CCC-SLP Acute Rehabilitation Services Office: [phone]  Secure chat preferred  05/05/2024,2:16 PM

## 2024-05-05 NOTE — Evaluation (Signed)
 Occupational Therapy Evaluation Patient Details Name: Colleen Pearson MRN: [id number] DOB: 1991/05/22 Today's Date: 05/05/2024   History of Present Illness   33 yo female presents to [facility] ED 11/01 with status epilepticus.  This is the 3rd known seizure this pregnancy. Pt unable to protect her airway after suspected aspiration requiring intubation and was transferred to [facility]. Fetal HR declined requiring emergency C-section birth 11/02. Pt extubated 11/13  PMH:[redacted] weeks pregnantseizure disorder and polysubstance abuse.     Clinical Impressions Pt is typically independent in ADL and mobility, works as a tax inspector. Today she is limited by cognition, and knees buckling in standing requiring mod A +2 safety. She can recall that she had a c section and perseverates on seeing her new baby Colleen Pearson but does not remember having a seizure. Functional task performance limited by cognition today and is overall mod A for UB and LB ADL. OT will continue to follow acutely to maximize safety and independence in ADL and functional transfer. Specific cognitive goal set for taking care of child in addition to ADL. At this time recommending HHOT post acute- will monitor and update POC as needed.      If plan is discharge home, recommend the following:   Two people to help with walking and/or transfers;A lot of help with bathing/dressing/bathroom;Direct supervision/assist for medications management;Direct supervision/assist for financial management;Assist for transportation;Help with stairs or ramp for entrance;Supervision due to cognitive status     Functional Status Assessment   Patient has had a recent decline in their functional status and demonstrates the ability to make significant improvements in function in a reasonable and predictable amount of time.     Equipment Recommendations   None recommended by OT     Recommendations for Other Services   PT consult;Speech consult      Precautions/Restrictions   Precautions Precautions: Fall Recall of Precautions/Restrictions: Impaired Restrictions Weight Bearing Restrictions Per Provider Order: No Other Position/Activity Restrictions: C section     Mobility Bed Mobility Overal bed mobility: Needs Assistance Bed Mobility: Supine to Sit, Sit to Supine     Supine to sit: Min assist Sit to supine: Supervision   General bed mobility comments: min A  and use of bed rail to come to EoB, cues for log rolling and bracing abdomen with pillow to reduce pain with return to bed.    Transfers Overall transfer level: Needs assistance   Transfers: Sit to/from Stand, Bed to chair/wheelchair/BSC Sit to Stand: Mod assist, +2 safety/equipment     Step pivot transfers: Mod assist, +2 safety/equipment (steps up the bed)     General transfer comment: pt able to power up with minA x2 but requires mod to maxAx1 for maintain upright due to knee buckling, pt with head on PT shoulder needing encouragement to stay awake. modA x2 for lateral stepping towards HoB      Balance Overall balance assessment: Needs assistance Sitting-balance support: Single extremity supported, Feet supported Sitting balance-Leahy Scale: Poor     Standing balance support: Bilateral upper extremity supported, Reliant on assistive device for balance Standing balance-Leahy Scale: Poor Standing balance comment: knees buckling during session requring therapist intervention                           ADL either performed or assessed with clinical judgement   ADL Overall ADL's : Needs assistance/impaired Eating/Feeding: NPO Eating/Feeding Details (indicate cue type and reason): able to eat ice chips Grooming: Wash/dry  face;Maximal assistance;Sitting Grooming Details (indicate cue type and reason): cognition impacting Upper Body Bathing: Maximal assistance   Lower Body Bathing: Maximal assistance   Upper Body Dressing : Moderate  assistance   Lower Body Dressing: Maximal assistance   Toilet Transfer: Moderate assistance;+2 for physical assistance;+2 for safety/equipment   Toileting- Clothing Manipulation and Hygiene: Maximal assistance;Bed level       Functional mobility during ADLs: Moderate assistance;+2 for safety/equipment (2 person HHA knees buckling) General ADL Comments: cognition impacting physical function this session.     Vision Patient Visual Report: No change from baseline Additional Comments: eyes closed a lot during session, continue to assess     Perception         Praxis         Pertinent Vitals/Pain Pain Assessment Pain Assessment: Faces Faces Pain Scale: Hurts little more Pain Location: C-section incision with movement, taught log rolling to decreased pain with movement Pain Descriptors / Indicators: Colleen Pearson, Shooting, Tender Pain Intervention(s): Monitored during session, Repositioned     Extremity/Trunk Assessment Upper Extremity Assessment Upper Extremity Assessment: Generalized weakness   Lower Extremity Assessment Lower Extremity Assessment: Defer to PT evaluation   Cervical / Trunk Assessment Cervical / Trunk Assessment: Other exceptions (post C-section)   Communication Communication Communication: Impaired Factors Affecting Communication: Reduced clarity of speech (from 12 intubation)   Cognition Arousal: Lethargic Behavior During Therapy: Flat affect Cognition: Cognition impaired   Orientation impairments: Situation (knew she had a C section, did not remember siezure) Awareness: Intellectual awareness impaired, Online awareness impaired Memory impairment (select all impairments): Short-term memory, Working memory Attention impairment (select first level of impairment): Sustained attention Executive functioning impairment (select all impairments): Reasoning, Problem solving, Organization OT - Cognition Comments: potentially due to medication, Pt with poor safety  awareness, repetition of questions                 Following commands: Impaired Following commands impaired: Follows one step commands inconsistently     Cueing  General Comments   Cueing Techniques: Verbal cues;Gestural cues;Tactile cues  Pt is lethargic, and emotional during session, frustrated she can't drink and that she has not seen her baby yet, RN reducing Precedex , hopeful to be able to take pt to nursery this afternoon   Exercises     Shoulder Instructions      Home Living Family/patient expects to be discharged to:: Private residence Living Arrangements: Parent Available Help at Discharge: Family Type of Home: House Home Access: Stairs to enter Secretary/administrator of Steps: 4 Entrance Stairs-Rails: Can reach both Home Layout: One level     Bathroom Shower/Tub: Producer, Television/film/video: Standard                Prior Functioning/Environment Prior Level of Function : Independent/Modified Independent               ADLs Comments: 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, Music Problem List: Decreased strength;Decreased range of motion;Decreased activity tolerance;Impaired balance (sitting and/or standing);Decreased cognition;Decreased safety awareness;Decreased knowledge of use of DME or AE;Pain   OT Treatment/Interventions: Self-care/ADL training;Energy conservation;DME and/or AE instruction;Therapeutic activities;Patient/family education;Balance training      OT Goals(Current goals can be found in the care plan section)   Acute Rehab OT Goals Patient Stated Goal: see my baby OT Goal Formulation: With patient Time For Goal Achievement: 05/19/24 Potential to Achieve Goals: Good ADL Goals Pt Will Perform Grooming: standing;with supervision Pt Will Perform Upper Body Dressing:  with supervision;sitting Pt Will Perform Lower Body Dressing: with supervision;sit to/from stand Pt Will Transfer to Toilet: with  supervision;ambulating Pt Will Perform Toileting - Clothing Manipulation and hygiene: with modified independence;sitting/lateral leans Additional ADL Goal #1: Pt will be able to perform multi-step sequencing task related to care of baby (i.e. changing diaper, fixing bottle etc) with no cues or assist in standing   OT Frequency:  Min 2X/week    Co-evaluation PT/OT/SLP Co-Evaluation/Treatment: Yes Reason for Co-Treatment: Necessary to address cognition/behavior during functional activity;For patient/therapist safety;To address functional/ADL transfers PT goals addressed during session: Mobility/safety with mobility;Balance;Strengthening/ROM OT goals addressed during session: ADL's and self-care;Strengthening/ROM      AM-PAC OT 6 Clicks Daily Activity     Outcome Measure Help from another person eating meals?: Total (NPO) Help from another person taking care of personal grooming?: A Lot Help from another person toileting, which includes using toliet, bedpan, or urinal?: A Lot Help from another person bathing (including washing, rinsing, drying)?: A Lot Help from another person to put on and taking off regular upper body clothing?: A Lot Help from another person to put on and taking off regular lower body clothing?: A Lot 6 Click Score: 11   End of Session Nurse Communication: Mobility status  Activity Tolerance: Patient limited by lethargy;Other (comment) (sedation medication) Patient left: in bed;with call bell/phone within reach;with bed alarm set;with nursing/sitter in room  OT Visit Diagnosis: Unsteadiness on feet (R26.81);Other abnormalities of gait and mobility (R26.89);Muscle weakness (generalized) (M62.81);Other symptoms and signs involving cognitive function                Time: 1009-1035 OT Time Calculation (min): 26 min Charges:  OT General Charges $OT Visit: 1 Visit OT Evaluation $OT Eval Moderate Complexity: 1 Mod  Colleen Pearson Acute Rehabilitation  Services Office: [phone]   Colleen JINNY Odea 05/05/2024, 2:34 PM

## 2024-05-05 NOTE — Progress Notes (Signed)
 NEUROLOGY CONSULT FOLLOW UP NOTE   Date of service: May 05, 2024 Patient Name: Colleen Pearson MRN:  [id number] DOB:  Sep 25, 1990  Interval Hx/subjective   Patient continues to be somnolent, though she does appear better than yesterday, and much improved compared to previous weeks  Vitals   Vitals:   05/05/24 0000 05/05/24 0100 05/05/24 0215 05/05/24 0744  BP: 132/80 101/76    Pulse: 100 92    Resp: (!) 29 (!) 32    Temp:    98.3 F (36.8 C)  TempSrc:    Axillary  SpO2: 97% 98% 96%   Weight:      Height:         Body mass index is 27.99 kg/m.  Physical Exam   Recently extubated  Neurologic Examination    MS: She opens eyes and follows commands.  She is able to tell me that she is in the hospital, but answers Darryle long when I ask her which one.. 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   Sensory: She responds to mild stimulation bilaterally  Medications  Current Facility-Administered Medications:    acetaminophen  (TYLENOL ) tablet 650 mg, 650 mg, Per Tube, Q8H PRN, Byrum, Robert S, MD, 650 mg at 05/04/24 1542   Chlorhexidine  Gluconate Cloth 2 % PADS 6 each, 6 each, Topical, Daily, Arloa Folks D, NP, 6 each at 05/04/24 0800   coconut oil, 1 Application, Topical, PRN, Nicholaus Burnard HERO, MD, 1 Application at 04/28/24 2047   dexmedetomidine  (PRECEDEX ) 400 MCG/100ML (4 mcg/mL) infusion, 0-1.8 mcg/kg/hr, Intravenous, Titrated, Ogan, Okoronkwo U, MD, Last Rate: 27.6 mL/hr at 05/05/24 0800, 1.7 mcg/kg/hr at 05/05/24 0800   famotidine  (PEPCID ) tablet 20 mg, 20 mg, Per Tube, BID, Maree Harder, MD, 20 mg at 05/04/24 2236   feeding supplement (OSMOLITE 1.5 CAL) liquid 1,000 mL, 1,000 mL, Per Tube, Continuous, Shelah Lamar RAMAN, MD, Paused at 05/04/24 1945   free water  25 mL, 25 mL, Per Tube, Q6H, Hussein, Lenny, MD, 25 mL at 05/04/24  1723   heparin  injection 5,000 Units, 5,000 Units, Subcutaneous, Q8H, Maree Harder, MD, 5,000 Units at 05/04/24 2236   ipratropium-albuterol  (DUONEB) 0.5-2.5 (3) MG/3ML nebulizer solution 3 mL, 3 mL, Nebulization, Q6H, Claudene Chew C, NP, 3 mL at 05/05/24 0804   lamoTRIgine  (LAMICTAL ) tablet 150 mg, 150 mg, Per Tube, BID, Michaela Aisha SQUIBB, MD   levETIRAcetam  (KEPPRA ) 100 MG/ML solution 1,500 mg, 1,500 mg, Per Tube, BID, Pawar, Rahul, MD, 1,500 mg at 05/04/24 2236   liver oil-zinc oxide (DESITIN) 40 % ointment, , Topical, TID PRN, Sharie Lenny, MD   ondansetron  (ZOFRAN ) injection 4 mg, 4 mg, Intravenous, Q6H PRN, Maree Harder, MD, 4 mg at 04/25/24 1834   Oral care mouth rinse, 15 mL, Mouth Rinse, Q2H, Maree Harder, MD, 15 mL at 05/04/24 1723   Oral care mouth rinse, 15 mL, Mouth Rinse, PRN, Maree Harder, MD, 15 mL at 04/28/24 0540   piperacillin -tazobactam (ZOSYN ) IVPB 3.375 g, 3.375 g, Intravenous, Q8H, Hussein, Lenny, MD, Stopped at 05/05/24 9271   polyethylene glycol (MIRALAX  / GLYCOLAX ) packet 17 g, 17 g, Per Tube, BID, Dang, Thuy D, RPH, 17 g at 05/03/24 2133   senna (SENOKOT) tablet 17.2 mg, 2 tablet, Per Tube, BID, Dang, Thuy D, RPH, 17.2 mg at 05/03/24 2133   [COMPLETED] thiamine (VITAMIN B1) tablet 500 mg, 500 mg, Per Tube, Daily,  500 mg at 05/02/24 0939 **FOLLOWED BY** thiamine (VITAMIN B1) tablet 100 mg, 100 mg, Per Tube, Daily, Hussein, Abdullahi, MD, 100 mg at 05/04/24 0920   vancomycin  (VANCOREADY) IVPB 1500 mg/300 mL, 1,500 mg, Intravenous, Q24H, Kara Dorn NOVAK, MD, Stopped at 05/04/24 1922  L[facility] and Diagnostic [facility]   Creatinine 0.79    Assessment   Colleen Pearson is a 33 y.o. female with breakthrough seizures in the setting of cocaine/possible medication noncompliance/pregnancy.  She has been weaned from sedation, and her exam is improved, but she remains very somnolent.  She was on increased doses of both Keppra  and lamotrigine  because of her  pregnancy, and now that she is postdelivery, my expectation is that these levels are increasing.  I will back down on her lamotrigine  level today  Recommendations  Continue Keppra  1500 mg twice daily(increased from home 1000 mg twice daily) Decrease lamotrigine  to 150 twice daily ______________________________________________________________________   Signed, Aisha Seals, MD [facility]

## 2024-05-05 NOTE — Progress Notes (Signed)
 Speech Language Pathology Treatment: Dysphagia  Patient Details Name: Colleen Pearson MRN: [id number] DOB: 05-15-1991 Today's Date: 05/05/2024 Time: 8898-8886 SLP Time Calculation (min) (ACUTE ONLY): 12 min  Assessment / Plan / Recommendation Clinical Impression  Pt is alert and cooperative, on room air now, but quite impulsive when it comes to PO trials. Dysphonia persists, although she subjectively thinks it is improving. She continues to cough with thin liquids more so than other consistencies. Given ongoing signs of post-extubation dysphagia, instrumental testing is likely indicated. I think her mentation would allow for participation. Will keep NPO except for ice chips in moderation, after oral care and with staff, until further testing can be completed.    HPI HPI: 33 yo female with history of epilepsy and substance use disorder who presented to [facility] 11/1 after having several seizures. Intubated due to reduced secretion management, CXR showed patchy opacities consistent with bilateral bronchopneumonia. ETT 11/1-11/13. S/p emergent C-section at 32 weeks 11/2. UDS + for cocaine and THC. EEG negative for seizure activity, d/c 11/9. MRI negative for acute abnormality. Cortrak placed 11/12. Seen by SLP during admission for seizures s/p 5 day intubation for dysphagia. MBS 03/01/23 showed silent aspiration of thin and nectar thick liquids, recommending a Dys diet with honey thick liquids before being advanced to a regular diet with thin liquids as mentation improved (MBS 03/09/23 also showed improved function with only transient penetration of thin liquids).      SLP Plan  MBS          Recommendations  Diet recommendations: NPO;Other(comment) (ice chips in moderation after oral care and with staff) Medication Administration: Via alternative means                  Oral care QID;Oral care prior to ice chip/H20     Dysphagia, unspecified (R13.10)     MBS     Leita SAILOR., M.A.  CCC-SLP Acute Rehabilitation Services Office: [phone]  Secure chat preferred   05/05/2024, 11:30 AM

## 2024-05-06 DIAGNOSIS — G40901 Epilepsy, unspecified, not intractable, with status epilepticus: Secondary | ICD-10-CM | POA: Diagnosis not present

## 2024-05-06 DIAGNOSIS — Z91148 Patient's other noncompliance with medication regimen for other reason: Secondary | ICD-10-CM

## 2024-05-06 DIAGNOSIS — R569 Unspecified convulsions: Secondary | ICD-10-CM

## 2024-05-06 LAB — CBC
HCT: 32.7 % — ABNORMAL LOW (ref 36.0–46.0)
Hemoglobin: 10.9 g/dL — ABNORMAL LOW (ref 12.0–15.0)
MCH: 28.1 pg (ref 26.0–34.0)
MCHC: 33.3 g/dL (ref 30.0–36.0)
MCV: 84.3 fL (ref 80.0–100.0)
Platelets: 816 K/uL — ABNORMAL HIGH (ref 150–400)
RBC: 3.88 MIL/uL (ref 3.87–5.11)
RDW: 13.3 % (ref 11.5–15.5)
WBC: 10 K/uL (ref 4.0–10.5)
nRBC: 0 % (ref 0.0–0.2)

## 2024-05-06 LAB — GLUCOSE, CAPILLARY
Glucose-Capillary: 109 mg/dL — ABNORMAL HIGH (ref 70–99)
Glucose-Capillary: 110 mg/dL — ABNORMAL HIGH (ref 70–99)
Glucose-Capillary: 112 mg/dL — ABNORMAL HIGH (ref 70–99)
Glucose-Capillary: 146 mg/dL — ABNORMAL HIGH (ref 70–99)
Glucose-Capillary: 162 mg/dL — ABNORMAL HIGH (ref 70–99)
Glucose-Capillary: 98 mg/dL (ref 70–99)

## 2024-05-06 LAB — TRIGLYCERIDES: Triglycerides: 195 mg/dL — ABNORMAL HIGH (ref <150)

## 2024-05-06 LAB — COMPREHENSIVE METABOLIC PANEL WITH GFR
ALT: 28 U/L (ref 0–44)
AST: 28 U/L (ref 15–41)
Albumin: 2.1 g/dL — ABNORMAL LOW (ref 3.5–5.0)
Alkaline Phosphatase: 114 U/L (ref 38–126)
Anion gap: 15 (ref 5–15)
BUN: 22 mg/dL — ABNORMAL HIGH (ref 6–20)
CO2: 19 mmol/L — ABNORMAL LOW (ref 22–32)
Calcium: 8 mg/dL — ABNORMAL LOW (ref 8.9–10.3)
Chloride: 101 mmol/L (ref 98–111)
Creatinine, Ser: 0.63 mg/dL (ref 0.44–1.00)
GFR, Estimated: >60 mL/min (ref >60)
Glucose, Bld: 120 mg/dL — ABNORMAL HIGH (ref 70–99)
Potassium: 3.1 mmol/L — ABNORMAL LOW (ref 3.5–5.1)
Sodium: 135 mmol/L (ref 135–145)
Total Bilirubin: 0.7 mg/dL (ref 0.0–1.2)
Total Protein: 6.5 g/dL (ref 6.5–8.1)

## 2024-05-06 LAB — MAGNESIUM: Magnesium: 1.9 mg/dL (ref 1.7–2.4)

## 2024-05-06 LAB — PHOSPHORUS: Phosphorus: 3.1 mg/dL (ref 2.5–4.6)

## 2024-05-06 MED ORDER — OXYCODONE HCL 5 MG PO TABS
5.0000 mg | ORAL_TABLET | Freq: Once | ORAL | Status: AC
  Administered 2024-05-06: 5 mg
  Filled 2024-05-06: qty 1

## 2024-05-06 MED ORDER — MELATONIN 3 MG PO TABS
3.0000 mg | ORAL_TABLET | Freq: Every evening | ORAL | Status: DC | PRN
  Filled 2024-05-06: qty 1

## 2024-05-06 MED ORDER — GUAIFENESIN 100 MG/5ML PO LIQD
5.0000 mL | ORAL | Status: DC | PRN
Start: 2024-05-06 — End: 2024-05-08
  Administered 2024-05-06 – 2024-05-07 (×2): 5 mL via ORAL
  Filled 2024-05-06 (×2): qty 5

## 2024-05-06 MED ORDER — IPRATROPIUM-ALBUTEROL 0.5-2.5 (3) MG/3ML IN SOLN
3.0000 mL | Freq: Four times a day (QID) | RESPIRATORY_TRACT | Status: DC | PRN
  Administered 2024-05-06 – 2024-05-07 (×4): 3 mL via RESPIRATORY_TRACT
  Filled 2024-05-06 (×4): qty 3

## 2024-05-06 MED ORDER — MELATONIN 3 MG PO TABS
3.0000 mg | ORAL_TABLET | Freq: Every evening | ORAL | Status: DC | PRN
  Administered 2024-05-06 – 2024-05-07 (×3): 3 mg
  Filled 2024-05-06 (×2): qty 1

## 2024-05-06 MED ORDER — POTASSIUM CHLORIDE 20 MEQ PO PACK: 40.0000 meq | PACK | Freq: Two times a day (BID) | ORAL | Status: DC

## 2024-05-06 MED ORDER — POTASSIUM CHLORIDE 20 MEQ PO PACK
40.0000 meq | PACK | Freq: Two times a day (BID) | ORAL | Status: AC
  Administered 2024-05-06 – 2024-05-07 (×2): 40 meq
  Filled 2024-05-06 (×2): qty 2

## 2024-05-06 MED ORDER — LORAZEPAM 2 MG/ML IJ SOLN
1.0000 mg | INTRAMUSCULAR | Status: DC | PRN
  Administered 2024-05-06 – 2024-05-07 (×4): 1 mg via INTRAVENOUS
  Filled 2024-05-06 (×4): qty 1

## 2024-05-06 NOTE — Hospital Course (Signed)
 Patient is a 33 years old female G3, P0 with history of epilepsy and polysubstance abuse presented to hospital with several episodes of seizures.  EMS administered magnesium  sulfate and midazolam  and was subsequently lethargic with secretions and was unable to protect her airways and was intubated in the ED.  She was subsequently started on Versed  drip and loaded with Keppra .  There was report of noncompliance with her seizure medications including Keppra  and Lamictal .   She was subsequently intubated and had difficulty weaning from ventilator but was subsequently extubated 05/04/2024.  Was weaned off Precedex  as well and was starting to follow commands so was transferred to medical unit.  Patient does have cortrak tube tube and speech therapy following.  Neuro following and is on Keppra  and Lamictal .  Sequence of events.  11/1: transfer to [facility] from [facility] 11/2 admitted to ICU intubated and sedated signs of fetal distress resulting in emergent C-section 11/3 remains intubated deeply sedated on LTM 11/4 sedation lightened > agitation, back to propofol  11/9 fentanyl  gtt changed to dilaudid gtt, cEEG stopped, MRI no acute abnormality  11/12 Patient failed wean on 05/02/2024- having to be placed on ketamine , propofol  gtt  11/13 Plan to extubate patient this morning, still having agitation  11/14 Extubated on 05/04/2024, was able to tolerate  Seizure disorder with status epilepticus likely secondary to medication noncompliance and substance use disorder. Multifactorial encephalopathy, agitated delirium, withdrawal- Encephalopathy improving at this time.  EEG was negative for seizure-like activity and continuous EEG was discontinued on 04/30/2024.  MRI of the brain was repeated on 11 9 with no acute abnormality.  CT head on 05/01/2024 was negative for acute findings.  Ammonia  level was -11 8.  Patient was subsequently extubated on 05/04/2024.  Plan is to continue Keppra  and Lamictal  as per neuro.  Was on  Precedex  which has been discontinued. Will consult psych to follow, to assist- patient may benefit from antidepressant in future     Status post emergent C-section at 73 weeks-for fetal distress Obstetrics following the patient   Acute hypoxic and hypercarbic respiratory failure Aspiration pneumonia Extubated on 11/13 Patient has completed unasyn 11/6.  Patient however developed new temperature leukocytosis and change in secretions on 1110 and was started on Zosyn .  Subsequently extubated 05/04/2024.  Continue pulmonary hygiene, incentive spirometry DuoNebs.  Completed vancomycin  and Zosyn  for 7 days   Acute renal failure, improved Improved.  Latest creatinine of 0.60  Acute blood loss anemia, postoperatively. Was passing some blood clots.  No further issues.  Hemoglobin from 04/25/2024 at 9.8.   Hyperlipidemia  LDL was 100, has been started on statins.   At risk for malnutrition On cortrak tube feeding.  Check speech and swallow evaluation.  Speech therapy has seen the patient and recommend n.p.o. at this time.  Plan for modified barium swallow  Deconditioning debility.  Physical therapy has seen the patient and recommended home health PT on discharge.

## 2024-05-06 NOTE — Plan of Care (Signed)
   Problem: Coping: Goal: Ability to adjust to condition or change in health will improve Outcome: Progressing   Problem: Metabolic: Goal: Ability to maintain appropriate glucose levels will improve Outcome: Progressing   Problem: Skin Integrity: Goal: Risk for impaired skin integrity will decrease Outcome: Progressing

## 2024-05-06 NOTE — Progress Notes (Addendum)
 PROGRESS NOTE  [name]:[id number] DOB: Jul 20, 1990 DOA: 04/23/2024 PCP: Colleen Pearson, No Pcp Per   LOS: 13 days   Brief narrative:  Colleen Pearson is a 33 years old female G3, P0 with history of epilepsy and polysubstance abuse presented to hospital with several episodes of seizures.  EMS administered magnesium  sulfate and midazolam  and was subsequently lethargic with secretions and was unable to protect her airways and was intubated in the ED.  She was subsequently started on Versed  drip and loaded with Keppra .  There was report of noncompliance with her seizure medications including Keppra  and Lamictal .   She was subsequently intubated and had difficulty weaning from ventilator but was subsequently extubated 05/04/2024.  Was weaned off Precedex  as well and was starting to follow commands so was transferred to medical unit.  Colleen Pearson does have cortrak tube tube and speech therapy following.  Neuro following and is on Keppra  and Lamictal .  Sequence of events.  11/1: transfer to [facility] from [facility] 11/2 admitted to ICU intubated and sedated signs of fetal distress resulting in emergent C-section 11/3 remains intubated deeply sedated on LTM 11/4 sedation lightened > agitation, back to propofol  11/9 fentanyl  gtt changed to dilaudid gtt, cEEG stopped, MRI no acute abnormality  11/12 Colleen Pearson failed wean on 05/02/2024- having to be placed on ketamine , propofol  gtt  11/13 Plan to extubate Colleen Pearson this morning, still having agitation  11/14 Extubated on 05/04/2024, was able to tolerate    Assessment/Plan: Principal Problem:   Status epilepticus (HCC) Active Problems:   Seizures (HCC)   Supervision of high risk pregnancy, antepartum   Seizure disorder during pregnancy in third trimester [facility])   Previous cesarean delivery affecting pregnancy, antepartum   Post-operative state   Substance use disorder  Seizure disorder with status epilepticus likely secondary to medication noncompliance and substance  use disorder.  Further seizures.  Multifactorial encephalopathy, agitated delirium, withdrawal- Encephalopathy has resolved at this time.  EEG was negative for seizure-like activity and continuous EEG was discontinued on 04/30/2024.  MRI of the brain was repeated on 11 9 with no acute abnormality.  CT head on 05/01/2024 was negative for acute findings.  Ammonia  level was -11 8.  Colleen Pearson was subsequently extubated on 05/04/2024.  Plan is to continue Keppra  and Lamictal  as per neuro.  Was on Precedex  which has been discontinued.  Hypokalemia.  Potassium of 3.1 today.  Will replace.  Check levels in AM.   Status post emergent C-section at 32 weeks-for fetal distress Obstetrics following the Colleen Pearson   Acute hypoxic and hypercarbic respiratory failure Aspiration pneumonia Extubated on 11/13 Colleen Pearson has completed unasyn 11/6.  Colleen Pearson however developed new temperature leukocytosis and change in secretions on 1110 and was started on Zosyn .  Subsequently extubated 05/04/2024.  Continue pulmonary hygiene, incentive spirometry DuoNebs.    Acute renal failure, improved Improved.  Latest creatinine of 0.60  Acute blood loss anemia, postoperatively. Was passing some blood clots.  No further issues.  Hemoglobin from 04/25/2024 at 9.8.   Hyperlipidemia  LDL was 100, has been started on statins.   At risk for malnutrition On cortrak tube feeding.  Check speech and swallow evaluation.  Speech therapy has seen the Colleen Pearson and recommend n.p.o. at this time.  Plan for modified barium swallow  Deconditioning debility.  Physical therapy has seen the Colleen Pearson and recommended home health PT on discharge.  DVT prophylaxis: heparin  injection 5,000 Units Start: 04/23/24 1400 SCD's Start: 04/23/24 1243   Disposition: Likely home with home health in 1 to  2 days  Status is: Inpatient Remains inpatient appropriate because: Pending clinical improvement,    Code Status:     Code Status: Full Code  Family  Communication: None at bedside  Consultants: Critical care Neurology  Procedures: EEG  Anti-infectives:  Zosyn  IV  Anti-infectives (From admission, onward)    Start     Dose/Rate Route Frequency Ordered Stop   05/04/24 1800  vancomycin  (VANCOREADY) IVPB 1500 mg/300 mL  Status:  Discontinued        1,500 mg 150 mL/hr over 120 Minutes Intravenous Every 24 hours 05/03/24 1733 05/05/24 1042   05/03/24 1800  vancomycin  (VANCOCIN ) 1,500 mg in sodium chloride  0.9 % 500 mL IVPB  Status:  Discontinued        1,500 mg 257.5 mL/hr over 120 Minutes Intravenous  Once 05/03/24 1709 05/03/24 1710   05/03/24 1800  vancomycin  (VANCOREADY) IVPB 1500 mg/300 mL        1,500 mg 150 mL/hr over 120 Minutes Intravenous  Once 05/03/24 1710 05/04/24 0007   05/03/24 1745  vancomycin  (VANCOREADY) IVPB 1500 mg/300 mL  Status:  Discontinued        1,500 mg 150 mL/hr over 120 Minutes Intravenous  Once 05/03/24 1647 05/03/24 1709   05/01/24 0900  piperacillin -tazobactam (ZOSYN ) IVPB 3.375 g        3.375 g 12.5 mL/hr over 240 Minutes Intravenous Every 8 hours 05/01/24 0855 05/08/24 0159   04/23/24 0836  clindamycin  (CLEOCIN ) IVPB 900 mg       Placed in And Linked Group   900 mg 100 mL/hr over 30 Minutes Intravenous 60 min pre-op 04/23/24 0836 04/23/24 0849   04/23/24 0836  gentamicin  (GARAMYCIN ) 290 mg in dextrose  5 % 100 mL IVPB       Placed in And Linked Group   5 mg/kg  57 kg (Adjusted) 107.3 mL/hr over 60 Minutes Intravenous 60 min pre-op 04/23/24 0836 04/23/24 1133   04/23/24 0823  ceFAZolin  (ANCEF ) IVPB 2g/100 mL premix  Status:  Discontinued        2 g 200 mL/hr over 30 Minutes Intravenous 30 min pre-op 04/23/24 0823 04/23/24 0958   04/23/24 0400  Ampicillin-Sulbactam (UNASYN) 3 g in sodium chloride  0.9 % 100 mL IVPB        3 g 200 mL/hr over 30 Minutes Intravenous Every 6 hours 04/23/24 0247 04/28/24 0918        Subjective: Today, Colleen Pearson was seen and examined at bedside.  Colleen Pearson  states that she feels little trouble with breathing and has some cough she tried to drink since this morning and had to puke.  Denies any fever chills or dyspnea.  Objective: Vitals:   05/06/24 0730 05/06/24 0850  BP: 132/78   Pulse: 72   Resp: 16   Temp: 98.4 F (36.9 C)   SpO2: 99% 100%    Intake/Output Summary (Last 24 hours) at 05/06/2024 1052 Last data filed at 05/06/2024 0554 Gross per 24 hour  Intake 361.85 ml  Output --  Net 361.85 ml   Filed Weights   05/01/24 0410 05/02/24 0415 05/03/24 0500  Weight: 74 kg 73.6 kg 65 kg   Body mass index is 27.99 kg/m.   Physical Exam: GENERAL: Colleen Pearson is alert awake and oriented. Not in obvious distress.  Cortrak tube tube in place HENT: No scleral pallor or icterus. Pupils equally reactive to light. Oral mucosa is moist NECK: is supple, no gross swelling noted. CHEST: Decreased breath sounds bilaterally. CVS: S1 and S2 heard, no  murmur. Regular rate and rhythm.  ABDOMEN: Soft, non-tender, bowel sounds are present. EXTREMITIES: No edema. CNS: Cranial nerves are intact. No focal motor deficits. SKIN: warm and dry without rashes.  Data Review: I have personally reviewed the following laboratory data and studies,  CBC: Recent Labs  Lab 05/03/24 0331 05/03/24 1537 05/04/24 0226 05/05/24 0209 05/05/24 0230 05/06/24 0917  WBC 11.9* 14.3* 12.5*  --  12.7* 10.0  HGB 9.5* 10.6* 9.8* 9.2* 9.8* 10.9*  HCT 29.8* 32.6* 30.8* 27.0* 29.3* 32.7*  MCV 89.0 88.3 89.3  --  84.4 84.3  PLT 531* 616* 564*  --  627* 816*   Basic Metabolic Panel: Recent Labs  Lab 04/30/24 0829 05/02/24 0137 05/02/24 1803 05/03/24 0331 05/04/24 0226 05/05/24 0209 05/05/24 0230 05/06/24 0917  NA 136   < > 141 144 140 137 136 135  K 4.2   < > 5.1 4.5 4.4 3.9 3.7 3.1*  CL 100   < > 98 103 103  --  98 101  CO2 27   < > 29 29 25   --  22 19*  GLUCOSE 89   < > 90 121* 117*  --  114* 120*  BUN 11   < > 17 17 22*  --  23* 22*  CREATININE 0.58   < >  0.83 0.86 0.79  --  0.62 0.63  CALCIUM  8.0*   < > 8.3* 8.2* 8.3*  --  8.5* 8.0*  MG  --   --   --   --   --   --  1.8 1.9  PHOS 3.7  --   --   --   --   --  3.0 3.1   < > = values in this interval not displayed.   Liver Function Tests: Recent Labs  Lab 04/30/24 0829 05/06/24 0917  AST  --  28  ALT  --  28  ALKPHOS  --  114  BILITOT  --  0.7  PROT  --  6.5  ALBUMIN <1.5* 2.1*   No results for input(s): LIPASE, AMYLASE in the last 168 hours. No results for input(s): AMMONIA  in the last 168 hours. Cardiac Enzymes: No results for input(s): CKTOTAL, CKMB, CKMBINDEX, TROPONINI in the last 168 hours. BNP (last 3 results) Recent Labs    05/02/24 1431  BNP 116.0*    ProBNP (last 3 results) No results for input(s): PROBNP in the last 8760 hours.  CBG: Recent Labs  Lab 05/05/24 1603 05/05/24 2036 05/06/24 0003 05/06/24 0402 05/06/24 0735  GLUCAP 144* 118* 112* 109* 98   Recent Results (from the past 240 hours)  Culture, Respiratory w Gram Stain     Status: None   Collection Time: 05/01/24  1:15 PM   Specimen: Tracheal Aspirate; Respiratory  Result Value Ref Range Status   Specimen Description TRACHEAL ASPIRATE  Final   Special Requests NONE  Final   Gram Stain NO WBC SEEN NO ORGANISMS SEEN   Final   Culture   Final    RARE Normal respiratory flora-no Staph aureus or Pseudomonas seen Performed at [facility], [address], [place], [place] [address]    Report Status 05/04/2024 FINAL  Final  Respiratory (~20 pathogens) panel by PCR     Status: None   Collection Time: 05/02/24  2:26 PM   Specimen: Nasopharyngeal Swab; Respiratory  Result Value Ref Range Status   Adenovirus NOT DETECTED NOT DETECTED Final   Coronavirus 229E NOT DETECTED NOT DETECTED  Final    Comment: (NOTE) The Coronavirus on the Respiratory Panel, DOES NOT test for the novel  Coronavirus (2019 nCoV)    Coronavirus HKU1 NOT DETECTED NOT DETECTED Final   Coronavirus NL63  NOT DETECTED NOT DETECTED Final   Coronavirus OC43 NOT DETECTED NOT DETECTED Final   Metapneumovirus NOT DETECTED NOT DETECTED Final   Rhinovirus / Enterovirus NOT DETECTED NOT DETECTED Final   Influenza A NOT DETECTED NOT DETECTED Final   Influenza B NOT DETECTED NOT DETECTED Final   Parainfluenza Virus 1 NOT DETECTED NOT DETECTED Final   Parainfluenza Virus 2 NOT DETECTED NOT DETECTED Final   Parainfluenza Virus 3 NOT DETECTED NOT DETECTED Final   Parainfluenza Virus 4 NOT DETECTED NOT DETECTED Final   Respiratory Syncytial Virus NOT DETECTED NOT DETECTED Final   Bordetella pertussis NOT DETECTED NOT DETECTED Final   Bordetella Parapertussis NOT DETECTED NOT DETECTED Final   Chlamydophila pneumoniae NOT DETECTED NOT DETECTED Final   Mycoplasma pneumoniae NOT DETECTED NOT DETECTED Final    Comment: Performed at [facility], [address] [place], [place] [address]     Studies: DG Swallowing Func-Speech Pathology Result Date: 05/05/2024 Table formatting from the original result was not included. Modified Barium Swallow Study Colleen Pearson Details Name: Colleen Pearson MRN: [id number] Date of Birth: [birth year] Today's Date: 05/05/2024 HPI/PMH: HPI: 33 yo female with history of epilepsy and substance use disorder who presented to [facility] 11/1 after having several seizures. Intubated due to reduced secretion management, CXR showed patchy opacities consistent with bilateral bronchopneumonia. ETT 11/1-11/13. S/p emergent C-section at 32 weeks 11/2. UDS + for cocaine and THC. EEG negative for seizure activity, d/c 11/9. MRI negative for acute abnormality. Cortrak placed 11/12. Seen by SLP during admission for seizures s/p 5 day intubation for dysphagia. MBS 03/01/23 showed silent aspiration of thin and nectar thick liquids, recommending a Dys diet with honey thick liquids before being advanced to a regular diet with thin liquids as mentation improved (MBS 03/09/23 also showed improved function with only  transient penetration of thin liquids). Clinical Impression: Pt has a post-extubation dysphagia with MBS also limited by reduced bolus acceptance and excessive movement. Aspiration with liquids is inconsistently sensed. Recommend that she stay NPO but could offer snacks of purees from the floor stock. Would do this with full supervision for positioning and pacing. Would offer purees OR ice chips (not at the same time), providing oral care first. Hopeful for continued progress with increased time post-extubation. Oral phase is grossly functional except for slow mastication of solids. Pharyngeally, she has reduced hyolaryngeal movement and laryngeal vestibule closure. Aspiration occurs with thin and nectar thick liquids (PAS 7-8), with additional penetration of honey thick liquids. Some of the aspiration does come back up above her vocal folds, but she cannot clear all of it or all of the penetration in her laryngeal vestibule. Airway protection and pharyngeal clearance is complete with purees. Pt was unfortunately gagging on the barium taste throughout the study and did not take large or consecutive volumes despite her efforts. Given inconsistent airway protection with liquids in the small volumes, concern for aspiration at bedside is increased given amount of impulsivity observed at bedside. Factors that may increase risk of adverse event in presence of aspiration Noe & Lianne 2021): Factors that may increase risk of adverse event in presence of aspiration Noe & Lianne 2021): Reduced cognitive function; Frail or deconditioned; Weak cough; Presence of tubes (ETT, trach, NG, etc.); Aspiration of thick, dense, and/or  acidic materials Recommendations/Plan: Swallowing Evaluation Recommendations Swallowing Evaluation Recommendations Recommendations: NPO; Ice chips PRN after oral care (small amounts of ice OR purees from the floor stock) Medication Administration: Via alternative means Supervision: Full  supervision/cueing for swallowing strategies Oral care recommendations: Oral care QID (4x/day); Oral care before ice chips/water  Treatment Plan Treatment Plan Treatment recommendations: Therapy as outlined in treatment plan below Follow-up recommendations: Acute inpatient rehab (3 hours/day) Functional status assessment: Colleen Pearson has had a recent decline in their functional status and demonstrates the ability to make significant improvements in function in a reasonable and predictable amount of time. Treatment frequency: Min 2x/week Treatment duration: 2 weeks Interventions: Aspiration precaution training; Compensatory techniques; Colleen Pearson/family education; Trials of upgraded texture/liquids; Respiratory muscle strength training Recommendations Recommendations for follow up therapy are one component of a multi-disciplinary discharge planning process, led by the attending physician.  Recommendations may be updated based on Colleen Pearson status, additional functional criteria and insurance authorization. Assessment: Orofacial Exam: Orofacial Exam Oral Cavity - Dentition: Adequate natural dentition Anatomy: Anatomy: WFL Boluses Administered: Boluses Administered Boluses Administered: Thin liquids (Level 0); Mildly thick liquids (Level 2, nectar thick); Moderately thick liquids (Level 3, honey thick); Puree; Solid  Oral Impairment Domain: Oral Impairment Domain Lip Closure: Interlabial escape, no progression to anterior lip Tongue control during bolus hold: Posterior escape of greater than half of bolus Bolus preparation/mastication: Slow prolonged chewing/mashing with complete recollection Bolus transport/lingual motion: Brisk tongue motion Oral residue: Complete oral clearance Location of oral residue : N/A Initiation of pharyngeal swallow : Posterior laryngeal surface of the epiglottis  Pharyngeal Impairment Domain: Pharyngeal Impairment Domain Soft palate elevation: No bolus between soft palate (SP)/pharyngeal wall (PW)  Laryngeal elevation: Partial superior movement of thyroid cartilage/partial approximation of arytenoids to epiglottic petiole Anterior hyoid excursion: Partial anterior movement Epiglottic movement: Complete inversion Laryngeal vestibule closure: Incomplete, narrow column air/contrast in laryngeal vestibule Pharyngeal stripping wave : Present - complete Pharyngeal contraction (A/P view only): N/A Pharyngoesophageal segment opening: Complete distension and complete duration, no obstruction of flow Tongue base retraction: No contrast between tongue base and posterior pharyngeal wall (PPW) Pharyngeal residue: Complete pharyngeal clearance Location of pharyngeal residue: N/A  Esophageal Impairment Domain: No data recorded Pill: No data recorded Penetration/Aspiration Scale Score: Penetration/Aspiration Scale Score 1.  Material does not enter airway: Puree; Solid 3.  Material enters airway, remains ABOVE vocal cords and not ejected out: Moderately thick liquids (Level 3, honey thick) 7.  Material enters airway, passes BELOW cords and not ejected out despite cough attempt by Colleen Pearson: Thin liquids (Level 0) 8.  Material enters airway, passes BELOW cords without attempt by Colleen Pearson to eject out (silent aspiration) : Mildly thick liquids (Level 2, nectar thick) Compensatory Strategies: No data recorded  General Information: Caregiver present: Yes BANKER)  Diet Prior to this Study: NPO; Cortrak/Small bore NG tube   Temperature : Normal   Respiratory Status: Increased WOB   Supplemental O2: None (Room air)   History of Recent Intubation: Yes  Behavior/Cognition: Alert; Impulsive; Distractible; Requires cueing Self-Feeding Abilities: Needs assist with self-feeding Baseline vocal quality/speech: Dysphonic Volitional Cough: Able to elicit Volitional Swallow: Able to elicit Exam Limitations: Excessive movement; Limited visibility; Poor bolus acceptance Goal Planning: Prognosis for improved oropharyngeal function: Good Barriers to  Reach Goals: Cognitive deficits No data recorded Colleen Pearson/Family Stated Goal: wants to go home Consulted and agree with results and recommendations: Colleen Pearson; Nurse Pain: Pain Assessment Pain Assessment: Faces Pain Score: 10 Faces Pain Scale: 0 Facial Expression: 0 Body Movements: 0 Muscle Tension: 0  Compliance with ventilator (intubated pts.): N/A Vocalization (extubated pts.): 0 CPOT Total: 0 Pain Location: C-section incision with movement, taught log rolling to decreased pain with movement Pain Descriptors / Indicators: Sharp; Shooting Pain Intervention(s): Limited activity within Colleen Pearson's tolerance; Monitored during session; Repositioned End of Session: Start Time:SLP Start Time (ACUTE ONLY): 1225 Stop Time: SLP Stop Time (ACUTE ONLY): 1240 Time Calculation:SLP Time Calculation (min) (ACUTE ONLY): 15 min Charges: SLP Evaluations $ SLP Speech Visit: 1 Visit SLP Evaluations $BSS Swallow: 1 Procedure $MBS Swallow: 1 Procedure $Swallowing Treatment: 1 Procedure SLP visit diagnosis: SLP Visit Diagnosis: Dysphagia, pharyngeal phase (R13.13) Past Medical History: Past Medical History: Diagnosis Date  Anemia   Asthma   Cocaine use complicating pregnancy in third trimester 08/30/2017  Endometriosis   Nexplanon  in place 11/04/2020  Ovarian cyst   Polysubstance abuse (HCC)   Seizures (HCC)   Status post cesarean section 08/30/2017 Past Surgical History: Past Surgical History: Procedure Laterality Date  CESAREAN SECTION N/A 08/30/2017  Procedure: CESAREAN SECTION;  Surgeon: Leonce Garnette BIRCH, MD;  Location: [facility] ORS;  Service: Obstetrics;  Laterality: N/A;  CESAREAN SECTION N/A 04/23/2024  Procedure: CESAREAN DELIVERY;  Surgeon: Izell Harari, MD;  Location: [facility] ORS;  Service: Obstetrics;  Laterality: N/A;  LASER ABLATION/CAUTERIZATION OF ENDOMETRIAL IMPLANTS   Leita SAILOR., M.A. CCC-SLP Acute Rehabilitation Services Office: [phone] Secure chat preferred 05/05/2024, 2:18 PM     Vernal Alstrom, MD  [facility] 05/06/2024  If 7PM-7AM, please contact night-coverage

## 2024-05-06 NOTE — Progress Notes (Signed)
 NEUROLOGY CONSULT FOLLOW UP NOTE   Date of service: May 06, 2024 Patient Name: Colleen Pearson MRN:  [id number] DOB:  [birth year]  Interval Hx/subjective   No family at the bedside. She is resting in bed in NAD. No new neurological events overnight   Vitals   Vitals:   05/06/24 0235 05/06/24 0415 05/06/24 0730 05/06/24 0850  BP:  132/75 132/78   Pulse:  85 72   Resp:  19 16   Temp:  97.7 F (36.5 C) 98.4 F (36.9 C)   TempSrc:  Oral    SpO2: 99% 96% 99% 100%  Weight:      Height:         Body mass index is 27.99 kg/m.  Physical Exam   Constitutional: Appears well-developed and well-nourished.   Psych: Affect appropriate to situation.    Eyes: No scleral injection.   HENT: No OP obstrucion.  Coretrak tube in place  Head: Normocephalic.   Cardiovascular: Normal rate and regular rhythm.   Respiratory: Effort normal, non-labored breathing.   GI: Soft.  No distension. There is no tenderness.   Skin: WDI.    Neurologic Examination   Mental Status -  Level of arousal and orientation to time, place, and person were intact. Language including expression, naming, repetition, comprehension was assessed and found intact  Cranial Nerves II - XII - II - Visual field intact OU. III, IV, VI - Extraocular movements intact.slightly disconjugate at first than corrected and were midline V - Facial sensation intact bilaterally . VII - Facial movement intact bilaterally . VIII - Hearing & vestibular intact bilaterally . X - Palate elevates symmetrically . XI - Chin turning & shoulder shrug intact bilaterally . XII - Tongue protrusion intact .  Motor Strength - The patient's strength was normal in all extremities and pronator drift was absent.  Bulk was normal and fasciculations were absent .   Motor Tone - Muscle tone was assessed at the neck and appendages and was normal . Sensory - Light touch, temperature/pinprick were assessed and were symmetrical.   Coordination - The  patient had normal movements in the hands and feet with no ataxia or dysmetria.  Tremor was absent. Gait and Station - deferred.  Medications  Current Facility-Administered Medications:    acetaminophen  (TYLENOL ) tablet 650 mg, 650 mg, Per Tube, Q8H PRN, Byrum, Robert S, MD, 650 mg at 05/06/24 0019   atorvastatin (LIPITOR) tablet 40 mg, 40 mg, Oral, Daily, Smith, Joshua C, NP, 40 mg at 05/06/24 9081   Chlorhexidine  Gluconate Cloth 2 % PADS 6 each, 6 each, Topical, Daily, Arloa Folks D, NP, 6 each at 05/05/24 2039   coconut oil, 1 Application, Topical, PRN, Nicholaus Burnard HERO, MD, 1 Application at 04/28/24 2047   dexmedetomidine  (PRECEDEX ) 400 MCG/100ML (4 mcg/mL) infusion, 0-1.8 mcg/kg/hr, Intravenous, Titrated, Claudene Fonda BROCKS, NP, Stopped at 05/05/24 1143   feeding supplement (OSMOLITE 1.5 CAL) liquid 1,000 mL, 1,000 mL, Per Tube, Continuous, Shelah Lamar RAMAN, MD, Paused at 05/04/24 1945   free water  100 mL, 100 mL, Per Tube, Q6H, Claudene Fonda C, NP, 100 mL at 05/06/24 0554   heparin  injection 5,000 Units, 5,000 Units, Subcutaneous, Q8H, Maree Harder, MD, 5,000 Units at 05/06/24 0554   ipratropium-albuterol  (DUONEB) 0.5-2.5 (3) MG/3ML nebulizer solution 3 mL, 3 mL, Nebulization, Q6H, Claudene Fonda C, NP, 3 mL at 05/06/24 0850   lamoTRIgine  (LAMICTAL ) tablet 150 mg, 150 mg, Per Tube, BID, Michaela Aisha SQUIBB, MD, 150 mg at 05/06/24 [phone]  levETIRAcetam  (KEPPRA ) 100 MG/ML solution 1,500 mg, 1,500 mg, Per Tube, BID, Pawar, Rahul, MD, 1,500 mg at 05/06/24 0919   liver oil-zinc oxide (DESITIN) 40 % ointment, , Topical, TID PRN, Sharie Bourbon, MD   loperamide HCl (IMODIUM) 1 MG/7.5ML suspension 2 mg, 2 mg, Per Tube, PRN, Mohammed, Shahid, MD   melatonin tablet 3 mg, 3 mg, Per Tube, QHS PRN, Mohammed, Shahid, MD, 3 mg at 05/06/24 0255   ondansetron  (ZOFRAN ) injection 4 mg, 4 mg, Intravenous, Q6H PRN, Maree Harder, MD, 4 mg at 04/25/24 1834   Oral care mouth rinse, 15 mL, Mouth Rinse, PRN,  Maree Harder, MD, 15 mL at 04/28/24 0540   Oral care mouth rinse, 15 mL, Mouth Rinse, Q4H, Mohammed, Shahid, MD, 15 mL at 05/06/24 0413   piperacillin -tazobactam (ZOSYN ) IVPB 3.375 g, 3.375 g, Intravenous, Q8H, Claudene Chew C, NP, Last Rate: 12.5 mL/hr at 05/06/24 0920, 3.375 g at 05/06/24 0920   polyethylene glycol (MIRALAX  / GLYCOLAX ) packet 17 g, 17 g, Per Tube, Daily, Claudene Chew BROCKS, NP   senna (SENOKOT) tablet 17.2 mg, 2 tablet, Per Tube, Daily, Claudene Chew BROCKS, NP   [COMPLETED] thiamine (VITAMIN B1) tablet 500 mg, 500 mg, Per Tube, Daily, 500 mg at 05/02/24 0939 **FOLLOWED BY** thiamine (VITAMIN B1) tablet 100 mg, 100 mg, Per Tube, Daily, Hussein, Bourbon, MD, 100 mg at 05/06/24 0919  Labs and Diagnostic Imaging   CBC:  Recent Labs  Lab 05/05/24 0230 05/06/24 0917  WBC 12.7* 10.0  HGB 9.8* 10.9*  HCT 29.3* 32.7*  MCV 84.4 84.3  PLT 627* 816*    Basic Metabolic Panel:  Lab Results  Component Value Date   NA 135 05/06/2024   K 3.1 (L) 05/06/2024   CO2 19 (L) 05/06/2024   GLUCOSE 120 (H) 05/06/2024   BUN 22 (H) 05/06/2024   CREATININE 0.63 05/06/2024   CALCIUM  8.0 (L) 05/06/2024   GFRNONAA >60 05/06/2024   GFRAA >60 07/21/2018   Lipid Panel:  Lab Results  Component Value Date   LDLCALC 100 (H) 04/22/2024   HgbA1c:  Lab Results  Component Value Date   HGBA1C 3.8 (L) 04/23/2024   Urine Drug Screen:     Component Value Date/Time   LABOPIA NONE DETECTED 04/23/2024 0107   COCAINSCRNUR POSITIVE (A) 04/23/2024 0107   COCAINSCRNUR POSITIVE (A) 08/21/2021 0220   LABBENZ POSITIVE (A) 04/23/2024 0107   AMPHETMU NONE DETECTED 04/23/2024 0107   THCU POSITIVE (A) 04/23/2024 0107   LABBARB NONE DETECTED 04/23/2024 0107    Alcohol Level     Component Value Date/Time   ETH <10 02/22/2023 0007   INR  Lab Results  Component Value Date   INR 1.1 04/23/2024   APTT  Lab Results  Component Value Date   APTT 26 04/23/2024   AED levels:  Lab Results  Component  Value Date   LAMOTRIGINE  10.6 05/01/2024   LEVETIRACETA 25.0 04/11/2024    CT Head without contrast 11/10:  No evidence of subarachnoid hemorrhage and stable non-contrast CT appearance of the brain.   Assessment   Colleen Pearson is a 33 y.o. female with breakthrough seizures in the setting of cocaine/possible medication noncompliance/pregnancy.  She has been weaned from sedation, and her exam is improved, but she remains very somnolent.  She was on increased doses of both Keppra  and lamotrigine  because of her pregnancy, and now that she is postdelivery, my expectation is that these levels are increasing.   lamotrigine  dose was decreased on 11/14  Recommendations   -  Seizure precautions - Continue Keppra  1500mg  BID, and lamotrigine  150mg  BID  - Will need outpatient follow up with her neurologist in 2-4 weeks after discharge  - Neurology will sign off Please call with questions or concerns  ______________________________________________________________________   Signed, Karna DELENA Geralds, NP [facility]

## 2024-05-06 NOTE — Progress Notes (Signed)
 Speech Language Pathology Treatment: Dysphagia  Patient Details Name: Colleen Pearson MRN: [id number] DOB: 1990/11/08 Today's Date: 05/06/2024 Time: 1530-1550 SLP Time Calculation (min) (ACUTE ONLY): 20 min  Assessment / Plan / Recommendation Clinical Impression  Patient seen by SLP for skilled treatment focused on dysphagia goals. Her mother, step father and sister arrived towards end of session. Per RN, patient has been agitated and focused on wanting to go home. When SLP arrived into room, patient sitting up in middle of bed with head down. SLP assessed her toleration of thin liquids (water , soda) and honey thick liquids. With thin liquids she exhibited immediate and delayed cough response and with honey thick liquids, she exhibited a delayed throat clear. Her voice is hoarse and although she indicated it was her normal smoker's voice her mother and step father both denied this. SLP discussed with patient and her family that currently she is not safe for PO's but given time, expect improvements in her swallow function. SLP will continue to follow for PO readiness trials.    HPI HPI: 33 yo female with history of epilepsy and substance use disorder who presented to [facility] 11/1 after having several seizures. Intubated due to reduced secretion management, CXR showed patchy opacities consistent with bilateral bronchopneumonia. ETT 11/1-11/13. S/p emergent C-section at 32 weeks 11/2. UDS + for cocaine and THC. EEG negative for seizure activity, d/c 11/9. MRI negative for acute abnormality. Cortrak placed 11/12. Seen by SLP during admission for seizures s/p 5 day intubation for dysphagia. MBS 03/01/23 showed silent aspiration of thin and nectar thick liquids, recommending a Dys diet with honey thick liquids before being advanced to a regular diet with thin liquids as mentation improved (MBS 03/09/23 also showed improved function with only transient penetration of thin liquids).      SLP Plan  Continue  with current plan of care          Recommendations  Diet recommendations: Other(comment);NPO (ice chips in moderation after oral care with staff) Medication Administration: Via alternative means                  Oral care QID;Oral care prior to ice chip/H20   Intermittent Supervision/Assistance Dysphagia, pharyngeal phase (R13.13)     Continue with current plan of care    Norleen IVAR Blase, MA, CCC-SLP Speech Therapy

## 2024-05-07 DIAGNOSIS — G40901 Epilepsy, unspecified, not intractable, with status epilepticus: Secondary | ICD-10-CM | POA: Diagnosis not present

## 2024-05-07 LAB — CBC
HCT: 30 % — ABNORMAL LOW (ref 36.0–46.0)
Hemoglobin: 10.3 g/dL — ABNORMAL LOW (ref 12.0–15.0)
MCH: 28.6 pg (ref 26.0–34.0)
MCHC: 34.3 g/dL (ref 30.0–36.0)
MCV: 83.3 fL (ref 80.0–100.0)
Platelets: 929 K/uL (ref 150–400)
RBC: 3.6 MIL/uL — ABNORMAL LOW (ref 3.87–5.11)
RDW: 13.4 % (ref 11.5–15.5)
WBC: 11.6 K/uL — ABNORMAL HIGH (ref 4.0–10.5)
nRBC: 0 % (ref 0.0–0.2)

## 2024-05-07 LAB — MAGNESIUM: Magnesium: 1.9 mg/dL (ref 1.7–2.4)

## 2024-05-07 LAB — BASIC METABOLIC PANEL WITH GFR
Anion gap: 13 (ref 5–15)
BUN: 18 mg/dL (ref 6–20)
CO2: 21 mmol/L — ABNORMAL LOW (ref 22–32)
Calcium: 8.3 mg/dL — ABNORMAL LOW (ref 8.9–10.3)
Chloride: 103 mmol/L (ref 98–111)
Creatinine, Ser: 0.7 mg/dL (ref 0.44–1.00)
GFR, Estimated: >60 mL/min (ref >60)
Glucose, Bld: 110 mg/dL — ABNORMAL HIGH (ref 70–99)
Potassium: 3.9 mmol/L (ref 3.5–5.1)
Sodium: 137 mmol/L (ref 135–145)

## 2024-05-07 LAB — GLUCOSE, CAPILLARY
Glucose-Capillary: 105 mg/dL — ABNORMAL HIGH (ref 70–99)
Glucose-Capillary: 114 mg/dL — ABNORMAL HIGH (ref 70–99)
Glucose-Capillary: 115 mg/dL — ABNORMAL HIGH (ref 70–99)
Glucose-Capillary: 126 mg/dL — ABNORMAL HIGH (ref 70–99)
Glucose-Capillary: 141 mg/dL — ABNORMAL HIGH (ref 70–99)
Glucose-Capillary: 143 mg/dL — ABNORMAL HIGH (ref 70–99)
Glucose-Capillary: 145 mg/dL — ABNORMAL HIGH (ref 70–99)

## 2024-05-07 LAB — PHOSPHORUS: Phosphorus: 2.7 mg/dL (ref 2.5–4.6)

## 2024-05-07 MED ORDER — PANTOPRAZOLE SODIUM 40 MG IV SOLR
40.0000 mg | INTRAVENOUS | Status: DC
  Administered 2024-05-07: 40 mg via INTRAVENOUS
  Filled 2024-05-07: qty 10

## 2024-05-07 NOTE — Plan of Care (Signed)
  Problem: Coping: Goal: Ability to adjust to condition or change in health will improve Outcome: Not Progressing   Problem: Fluid Volume: Goal: Ability to maintain a balanced intake and output will improve Outcome: Not Progressing   Problem: Health Behavior/Discharge Planning: Goal: Ability to identify and utilize available resources and services will improve Outcome: Not Progressing Goal: Ability to manage health-related needs will improve Outcome: Not Progressing   Problem: Metabolic: Goal: Ability to maintain appropriate glucose levels will improve Outcome: Not Progressing   Problem: Nutritional: Goal: Maintenance of adequate nutrition will improve Outcome: Not Progressing Goal: Progress toward achieving an optimal weight will improve Outcome: Not Progressing   Problem: Skin Integrity: Goal: Risk for impaired skin integrity will decrease Outcome: Not Progressing   Problem: Tissue Perfusion: Goal: Adequacy of tissue perfusion will improve Outcome: Not Progressing   Problem: Education: Goal: Knowledge of General Education information will improve Description: Including pain rating scale, medication(s)/side effects and non-pharmacologic comfort measures Outcome: Not Progressing   Problem: Health Behavior/Discharge Planning: Goal: Ability to manage health-related needs will improve Outcome: Not Progressing   Problem: Clinical Measurements: Goal: Ability to maintain clinical measurements within normal limits will improve Outcome: Not Progressing Goal: Will remain free from infection Outcome: Not Progressing Goal: Diagnostic test results will improve Outcome: Not Progressing Goal: Respiratory complications will improve Outcome: Not Progressing Goal: Cardiovascular complication will be avoided Outcome: Not Progressing   Problem: Activity: Goal: Risk for activity intolerance will decrease Outcome: Not Progressing   Problem: Nutrition: Goal: Adequate nutrition  will be maintained Outcome: Not Progressing   Problem: Coping: Goal: Level of anxiety will decrease Outcome: Not Progressing   Problem: Elimination: Goal: Will not experience complications related to bowel motility Outcome: Not Progressing Goal: Will not experience complications related to urinary retention Outcome: Not Progressing   Problem: Pain Managment: Goal: General experience of comfort will improve and/or be controlled Outcome: Not Progressing   Problem: Safety: Goal: Ability to remain free from injury will improve Outcome: Not Progressing   Problem: Skin Integrity: Goal: Risk for impaired skin integrity will decrease Outcome: Not Progressing   Problem: Education: Goal: Knowledge of the prescribed therapeutic regimen will improve Outcome: Not Progressing Goal: Understanding of sexual limitations or changes related to disease process or condition will improve Outcome: Not Progressing   Problem: Self-Concept: Goal: Communication of feelings regarding changes in body function or appearance will improve Outcome: Not Progressing   Problem: Skin Integrity: Goal: Demonstration of wound healing without infection will improve Outcome: Not Progressing   Problem: Education: Goal: Knowledge of condition will improve Outcome: Not Progressing   Problem: Activity: Goal: Will verbalize the importance of balancing activity with adequate rest periods Outcome: Not Progressing Goal: Ability to tolerate increased activity will improve Outcome: Not Progressing   Problem: Coping: Goal: Ability to identify and utilize available resources and services will improve Outcome: Not Progressing   Problem: Life Cycle: Goal: Chance of risk for complications during the postpartum period will decrease Outcome: Not Progressing   Problem: Role Relationship: Goal: Ability to demonstrate positive interaction with newborn will improve Outcome: Not Progressing   Problem: Skin  Integrity: Goal: Demonstration of wound healing without infection will improve Outcome: Not Progressing

## 2024-05-07 NOTE — Plan of Care (Signed)
  Problem: Coping: Goal: Ability to adjust to condition or change in health will improve Outcome: Progressing   Problem: Skin Integrity: Goal: Risk for impaired skin integrity will decrease Outcome: Progressing   Problem: Nutrition: Goal: Adequate nutrition will be maintained Outcome: Progressing

## 2024-05-07 NOTE — Progress Notes (Signed)
 PROGRESS NOTE  Colleen Pearson FMW:[id number] DOB: 12-11-1990 DOA: 04/23/2024 PCP: Patient, No Pcp Per   LOS: 14 days   Brief narrative:  Patient is a 33 years old female G3, P0 with history of epilepsy and polysubstance abuse presented to hospital with several episodes of seizures.  EMS administered magnesium  sulfate and midazolam  and was subsequently lethargic with secretions and was unable to protect her airways and was intubated in the ED.  She was subsequently started on Versed  drip and loaded with Keppra .  There was report of noncompliance with her seizure medications including Keppra  and Lamictal .   She was subsequently intubated and had difficulty weaning from ventilator but was subsequently extubated 05/04/2024.  Was weaned off Precedex  as well and was starting to follow commands so was transferred to medical unit.  Patient does have cortrak tube tube and speech therapy following.  Neuro following and is on Keppra  and Lamictal .  Sequence of events.  11/1: transfer to [facility] from [facility] 11/2 admitted to ICU intubated and sedated signs of fetal distress resulting in emergent C-section 11/3 remains intubated deeply sedated on LTM 11/4 sedation lightened > agitation, back to propofol  11/9 fentanyl  gtt changed to dilaudid gtt, cEEG stopped, MRI no acute abnormality  11/12 Patient failed wean on 05/02/2024- having to be placed on ketamine , propofol  gtt  11/13 Plan to extubate patient this morning, still having agitation  11/14 Extubated on 05/04/2024, was able to tolerate    Assessment/Plan: Principal Problem:   Status epilepticus (HCC) Active Problems:   Seizures (HCC)   Supervision of high risk pregnancy, antepartum   Seizure disorder during pregnancy in third trimester [facility])   Previous cesarean delivery affecting pregnancy, antepartum   Post-operative state   Substance use disorder  Seizure disorder with status epilepticus likely secondary to medication noncompliance and substance  use disorder.  No report of further seizures.  Patient alert awake and oriented.  Multifactorial encephalopathy, agitated delirium, withdrawal-has resolved at this time.  .  EEG was negative for seizure-like activity and continuous EEG was discontinued on 04/30/2024.  MRI of the brain was repeated on 11 9 with no acute abnormality.  CT head on 05/01/2024 was negative for acute findings.  Ammonia  level was -11 8.  Patient was subsequently extubated on 05/04/2024.  Plan is to continue Keppra  and Lamictal  as per neuro.    Hypokalemia, improved after replacement.  Latest potassium of 3.9   Status post emergent C-section at 32 weeks-for fetal distress Obstetrics following the patient   Acute hypoxic and hypercarbic respiratory failure Aspiration pneumonia Extubated on 11/13 Patient has completed unasyn 11/6.  Patient however developed new temperature leukocytosis and change in secretions on 11/10 and was started on Zosyn .  Subsequently extubated 05/04/2024.  Continue pulmonary hygiene, incentive spirometry DuoNebs.  Continue Zosyn  to complete course.   Acute renal failure, improved Improved.  Latest creatinine of 0.70  Acute blood loss anemia, postoperatively. Was passing some blood clots.  No further issues.  Hemoglobintoday at 10.3.   Hyperlipidemia  LDL was 100, has been started on statins.   At risk for malnutrition On cortrak tube feeding.  Check speech and swallow evaluation.  Speech therapy has seen the patient and recommend n.p.o.  Plan for modified barium swallow.  Advance diet as able.  Thrombocytosis.  Significant.  Likely reactive secondary to infection/anemia.  Will need to continue to monitor  Deconditioning debility.  Physical therapy has seen the patient and recommended home health PT on discharge.  DVT prophylaxis: heparin  injection  5,000 Units Start: 04/23/24 1400 SCD's Start: 04/23/24 1243   Disposition: Likely home with home health in 1 to 2 days when p.o.  okay  Status is: Inpatient Remains inpatient appropriate because: Pending clinical improvement,    Code Status:     Code Status: Full Code  Family Communication: Spoke with the patient's mother at bedside 05/06/2024  Consultants: Critical care Neurology  Procedures: EEG  Anti-infectives:  Zosyn  IV  Anti-infectives (From admission, onward)    Start     Dose/Rate Route Frequency Ordered Stop   05/04/24 1800  vancomycin  (VANCOREADY) IVPB 1500 mg/300 mL  Status:  Discontinued        1,500 mg 150 mL/hr over 120 Minutes Intravenous Every 24 hours 05/03/24 1733 05/05/24 1042   05/03/24 1800  vancomycin  (VANCOCIN ) 1,500 mg in sodium chloride  0.9 % 500 mL IVPB  Status:  Discontinued        1,500 mg 257.5 mL/hr over 120 Minutes Intravenous  Once 05/03/24 1709 05/03/24 1710   05/03/24 1800  vancomycin  (VANCOREADY) IVPB 1500 mg/300 mL        1,500 mg 150 mL/hr over 120 Minutes Intravenous  Once 05/03/24 1710 05/04/24 0007   05/03/24 1745  vancomycin  (VANCOREADY) IVPB 1500 mg/300 mL  Status:  Discontinued        1,500 mg 150 mL/hr over 120 Minutes Intravenous  Once 05/03/24 1647 05/03/24 1709   05/01/24 0900  piperacillin -tazobactam (ZOSYN ) IVPB 3.375 g        3.375 g 12.5 mL/hr over 240 Minutes Intravenous Every 8 hours 05/01/24 0855 05/08/24 0159   04/23/24 0836  clindamycin  (CLEOCIN ) IVPB 900 mg       Placed in And Linked Group   900 mg 100 mL/hr over 30 Minutes Intravenous 60 min pre-op 04/23/24 0836 04/23/24 0849   04/23/24 0836  gentamicin  (GARAMYCIN ) 290 mg in dextrose  5 % 100 mL IVPB       Placed in And Linked Group   5 mg/kg  57 kg (Adjusted) 107.3 mL/hr over 60 Minutes Intravenous 60 min pre-op 04/23/24 0836 04/23/24 1133   04/23/24 0823  ceFAZolin  (ANCEF ) IVPB 2g/100 mL premix  Status:  Discontinued        2 g 200 mL/hr over 30 Minutes Intravenous 30 min pre-op 04/23/24 0823 04/23/24 0958   04/23/24 0400  Ampicillin-Sulbactam (UNASYN) 3 g in sodium chloride  0.9 %  100 mL IVPB        3 g 200 mL/hr over 30 Minutes Intravenous Every 6 hours 04/23/24 0247 04/28/24 0918        Subjective: Today, patient was seen and examined at bedside.  Patient lying in bed in feels anxious but otherwise okay.  Any nausea vomiting fever chills or rigor.  Wants to go home and eat but understands limitations with swallowing  Objective: Vitals:   05/07/24 0356 05/07/24 0800  BP: 132/68 119/66  Pulse: 71 81  Resp: 19 19  Temp: 97.7 F (36.5 C) 98.4 F (36.9 C)  SpO2: 98% 98%    Intake/Output Summary (Last 24 hours) at 05/07/2024 1122 Last data filed at 05/07/2024 9390 Gross per 24 hour  Intake 400 ml  Output --  Net 400 ml   Filed Weights   05/01/24 0410 05/02/24 0415 05/03/24 0500  Weight: 74 kg 73.6 kg 65 kg   Body mass index is 27.99 kg/m.   Physical Exam: GENERAL: Patient is alert awake and oriented. Not in obvious distress.  Cortrak tube tube in place mildly anxious HENT:  No scleral pallor or icterus. Pupils equally reactive to light. Oral mucosa is moist NECK: is supple, no gross swelling noted. CHEST: Decreased breath sounds bilaterally.  Coarse breath sounds noted. CVS: S1 and S2 heard, no murmur. Regular rate and rhythm.  ABDOMEN: Soft, non-tender, bowel sounds are present. EXTREMITIES: No edema. CNS: Cranial nerves are intact. No focal motor deficits. SKIN: warm and dry without rashes.  Data Review: I have personally reviewed the following laboratory data and studies,  CBC: Recent Labs  Lab 05/03/24 1537 05/04/24 0226 05/05/24 0209 05/05/24 0230 05/06/24 0917 05/07/24 0319  WBC 14.3* 12.5*  --  12.7* 10.0 11.6*  HGB 10.6* 9.8* 9.2* 9.8* 10.9* 10.3*  HCT 32.6* 30.8* 27.0* 29.3* 32.7* 30.0*  MCV 88.3 89.3  --  84.4 84.3 83.3  PLT 616* 564*  --  627* 816* 929*   Basic Metabolic Panel: Recent Labs  Lab 05/03/24 0331 05/04/24 0226 05/05/24 0209 05/05/24 0230 05/06/24 0917 05/07/24 0319  NA 144 140 137 136 135 137  K 4.5  4.4 3.9 3.7 3.1* 3.9  CL 103 103  --  98 101 103  CO2 29 25  --  22 19* 21*  GLUCOSE 121* 117*  --  114* 120* 110*  BUN 17 22*  --  23* 22* 18  CREATININE 0.86 0.79  --  0.62 0.63 0.70  CALCIUM  8.2* 8.3*  --  8.5* 8.0* 8.3*  MG  --   --   --  1.8 1.9 1.9  PHOS  --   --   --  3.0 3.1 2.7   Liver Function Tests: Recent Labs  Lab 05/06/24 0917  AST 28  ALT 28  ALKPHOS 114  BILITOT 0.7  PROT 6.5  ALBUMIN 2.1*   No results for input(s): LIPASE, AMYLASE in the last 168 hours. No results for input(s): AMMONIA  in the last 168 hours. Cardiac Enzymes: No results for input(s): CKTOTAL, CKMB, CKMBINDEX, TROPONINI in the last 168 hours. BNP (last 3 results) Recent Labs    05/02/24 1431  BNP 116.0*    ProBNP (last 3 results) No results for input(s): PROBNP in the last 8760 hours.  CBG: Recent Labs  Lab 05/06/24 1523 05/06/24 1954 05/07/24 0012 05/07/24 0356 05/07/24 0809  GLUCAP 146* 162* 145* 126* 114*   Recent Results (from the past 240 hours)  Culture, Respiratory w Gram Stain     Status: None   Collection Time: 05/01/24  1:15 PM   Specimen: Tracheal Aspirate; Respiratory  Result Value Ref Range Status   Specimen Description TRACHEAL ASPIRATE  Final   Special Requests NONE  Final   Gram Stain NO WBC SEEN NO ORGANISMS SEEN   Final   Culture   Final    RARE Normal respiratory flora-no Staph aureus or Pseudomonas seen Performed at [facility], [address], [place], [place] [address]    Report Status 05/04/2024 FINAL  Final  Respiratory (~20 pathogens) panel by PCR     Status: None   Collection Time: 05/02/24  2:26 PM   Specimen: Nasopharyngeal Swab; Respiratory  Result Value Ref Range Status   Adenovirus NOT DETECTED NOT DETECTED Final   Coronavirus 229E NOT DETECTED NOT DETECTED Final    Comment: (NOTE) The Coronavirus on the Respiratory Panel, DOES NOT test for the novel  Coronavirus (2019 nCoV)    Coronavirus HKU1 NOT DETECTED NOT  DETECTED Final   Coronavirus NL63 NOT DETECTED NOT DETECTED Final   Coronavirus OC43 NOT DETECTED NOT DETECTED  Final   Metapneumovirus NOT DETECTED NOT DETECTED Final   Rhinovirus / Enterovirus NOT DETECTED NOT DETECTED Final   Influenza A NOT DETECTED NOT DETECTED Final   Influenza B NOT DETECTED NOT DETECTED Final   Parainfluenza Virus 1 NOT DETECTED NOT DETECTED Final   Parainfluenza Virus 2 NOT DETECTED NOT DETECTED Final   Parainfluenza Virus 3 NOT DETECTED NOT DETECTED Final   Parainfluenza Virus 4 NOT DETECTED NOT DETECTED Final   Respiratory Syncytial Virus NOT DETECTED NOT DETECTED Final   Bordetella pertussis NOT DETECTED NOT DETECTED Final   Bordetella Parapertussis NOT DETECTED NOT DETECTED Final   Chlamydophila pneumoniae NOT DETECTED NOT DETECTED Final   Mycoplasma pneumoniae NOT DETECTED NOT DETECTED Final    Comment: Performed at [facility], [address] [place], [place] [address]     Studies: DG Swallowing Func-Speech Pathology Result Date: 05/05/2024 Table formatting from the original result was not included. Modified Barium Swallow Study Patient Details Name: TORI DATTILIO MRN: [id number] Date of Birth: 15-Mar-1991 Today's Date: 05/05/2024 HPI/PMH: HPI: 33 yo female with history of epilepsy and substance use disorder who presented to [facility] 11/1 after having several seizures. Intubated due to reduced secretion management, CXR showed patchy opacities consistent with bilateral bronchopneumonia. ETT 11/1-11/13. S/p emergent C-section at 32 weeks 11/2. UDS + for cocaine and THC. EEG negative for seizure activity, d/c 11/9. MRI negative for acute abnormality. Cortrak placed 11/12. Seen by SLP during admission for seizures s/p 5 day intubation for dysphagia. MBS 03/01/23 showed silent aspiration of thin and nectar thick liquids, recommending a Dys diet with honey thick liquids before being advanced to a regular diet with thin liquids as mentation improved (MBS 03/09/23 also  showed improved function with only transient penetration of thin liquids). Clinical Impression: Pt has a post-extubation dysphagia with MBS also limited by reduced bolus acceptance and excessive movement. Aspiration with liquids is inconsistently sensed. Recommend that she stay NPO but could offer snacks of purees from the floor stock. Would do this with full supervision for positioning and pacing. Would offer purees OR ice chips (not at the same time), providing oral care first. Hopeful for continued progress with increased time post-extubation. Oral phase is grossly functional except for slow mastication of solids. Pharyngeally, she has reduced hyolaryngeal movement and laryngeal vestibule closure. Aspiration occurs with thin and nectar thick liquids (PAS 7-8), with additional penetration of honey thick liquids. Some of the aspiration does come back up above her vocal folds, but she cannot clear all of it or all of the penetration in her laryngeal vestibule. Airway protection and pharyngeal clearance is complete with purees. Pt was unfortunately gagging on the barium taste throughout the study and did not take large or consecutive volumes despite her efforts. Given inconsistent airway protection with liquids in the small volumes, concern for aspiration at bedside is increased given amount of impulsivity observed at bedside. Factors that may increase risk of adverse event in presence of aspiration Noe & Lianne 2021): Factors that may increase risk of adverse event in presence of aspiration Noe & Lianne 2021): Reduced cognitive function; Frail or deconditioned; Weak cough; Presence of tubes (ETT, trach, NG, etc.); Aspiration of thick, dense, and/or acidic materials Recommendations/Plan: Swallowing Evaluation Recommendations Swallowing Evaluation Recommendations Recommendations: NPO; Ice chips PRN after oral care (small amounts of ice OR purees from the floor stock) Medication Administration: Via  alternative means Supervision: Full supervision/cueing for swallowing strategies Oral care recommendations: Oral care QID (4x/day); Oral care before ice  chips/water  Treatment Plan Treatment Plan Treatment recommendations: Therapy as outlined in treatment plan below Follow-up recommendations: Acute inpatient rehab (3 hours/day) Functional status assessment: Patient has had a recent decline in their functional status and demonstrates the ability to make significant improvements in function in a reasonable and predictable amount of time. Treatment frequency: Min 2x/week Treatment duration: 2 weeks Interventions: Aspiration precaution training; Compensatory techniques; Patient/family education; Trials of upgraded texture/liquids; Respiratory muscle strength training Recommendations Recommendations for follow up therapy are one component of a multi-disciplinary discharge planning process, led by the attending physician.  Recommendations may be updated based on patient status, additional functional criteria and insurance authorization. Assessment: Orofacial Exam: Orofacial Exam Oral Cavity - Dentition: Adequate natural dentition Anatomy: Anatomy: WFL Boluses Administered: Boluses Administered Boluses Administered: Thin liquids (Level 0); Mildly thick liquids (Level 2, nectar thick); Moderately thick liquids (Level 3, honey thick); Puree; Solid  Oral Impairment Domain: Oral Impairment Domain Lip Closure: Interlabial escape, no progression to anterior lip Tongue control during bolus hold: Posterior escape of greater than half of bolus Bolus preparation/mastication: Slow prolonged chewing/mashing with complete recollection Bolus transport/lingual motion: Brisk tongue motion Oral residue: Complete oral clearance Location of oral residue : N/A Initiation of pharyngeal swallow : Posterior laryngeal surface of the epiglottis  Pharyngeal Impairment Domain: Pharyngeal Impairment Domain Soft palate elevation: No bolus between soft  palate (SP)/pharyngeal wall (PW) Laryngeal elevation: Partial superior movement of thyroid cartilage/partial approximation of arytenoids to epiglottic petiole Anterior hyoid excursion: Partial anterior movement Epiglottic movement: Complete inversion Laryngeal vestibule closure: Incomplete, narrow column air/contrast in laryngeal vestibule Pharyngeal stripping wave : Present - complete Pharyngeal contraction (A/P view only): N/A Pharyngoesophageal segment opening: Complete distension and complete duration, no obstruction of flow Tongue base retraction: No contrast between tongue base and posterior pharyngeal wall (PPW) Pharyngeal residue: Complete pharyngeal clearance Location of pharyngeal residue: N/A  Esophageal Impairment Domain: No data recorded Pill: No data recorded Penetration/Aspiration Scale Score: Penetration/Aspiration Scale Score 1.  Material does not enter airway: Puree; Solid 3.  Material enters airway, remains ABOVE vocal cords and not ejected out: Moderately thick liquids (Level 3, honey thick) 7.  Material enters airway, passes BELOW cords and not ejected out despite cough attempt by patient: Thin liquids (Level 0) 8.  Material enters airway, passes BELOW cords without attempt by patient to eject out (silent aspiration) : Mildly thick liquids (Level 2, nectar thick) Compensatory Strategies: No data recorded  General Information: Caregiver present: Yes BANKER)  Diet Prior to this Study: NPO; Cortrak/Small bore NG tube   Temperature : Normal   Respiratory Status: Increased WOB   Supplemental O2: None (Room air)   History of Recent Intubation: Yes  Behavior/Cognition: Alert; Impulsive; Distractible; Requires cueing Self-Feeding Abilities: Needs assist with self-feeding Baseline vocal quality/speech: Dysphonic Volitional Cough: Able to elicit Volitional Swallow: Able to elicit Exam Limitations: Excessive movement; Limited visibility; Poor bolus acceptance Goal Planning: Prognosis for improved  oropharyngeal function: Good Barriers to Reach Goals: Cognitive deficits No data recorded Patient/Family Stated Goal: wants to go home Consulted and agree with results and recommendations: Patient; Nurse Pain: Pain Assessment Pain Assessment: Faces Pain Score: 10 Faces Pain Scale: 0 Facial Expression: 0 Body Movements: 0 Muscle Tension: 0 Compliance with ventilator (intubated pts.): N/A Vocalization (extubated pts.): 0 CPOT Total: 0 Pain Location: C-section incision with movement, taught log rolling to decreased pain with movement Pain Descriptors / Indicators: Sharp; Shooting Pain Intervention(s): Limited activity within patient's tolerance; Monitored during session; Repositioned End of Session: Start Time:SLP Start  Time (ACUTE ONLY): 1225 Stop Time: SLP Stop Time (ACUTE ONLY): 1240 Time Calculation:SLP Time Calculation (min) (ACUTE ONLY): 15 min Charges: SLP Evaluations $ SLP Speech Visit: 1 Visit SLP Evaluations $BSS Swallow: 1 Procedure $MBS Swallow: 1 Procedure $Swallowing Treatment: 1 Procedure SLP visit diagnosis: SLP Visit Diagnosis: Dysphagia, pharyngeal phase (R13.13) Past Medical History: Past Medical History: Diagnosis Date  Anemia   Asthma   Cocaine use complicating pregnancy in third trimester 08/30/2017  Endometriosis   Nexplanon  in place 11/04/2020  Ovarian cyst   Polysubstance abuse (HCC)   Seizures (HCC)   Status post cesarean section 08/30/2017 Past Surgical History: Past Surgical History: Procedure Laterality Date  CESAREAN SECTION N/A 08/30/2017  Procedure: CESAREAN SECTION;  Surgeon: Leonce Garnette BIRCH, MD;  Location: [facility] ORS;  Service: Obstetrics;  Laterality: N/A;  CESAREAN SECTION N/A 04/23/2024  Procedure: CESAREAN DELIVERY;  Surgeon: Izell Harari, MD;  Location: [facility] ORS;  Service: Obstetrics;  Laterality: N/A;  LASER ABLATION/CAUTERIZATION OF ENDOMETRIAL IMPLANTS   Leita SAILOR., M.A. CCC-SLP Acute Rehabilitation Services Office: [phone] Secure chat preferred 05/05/2024, 2:18  PM     Vernal Alstrom, MD  [facility] 05/07/2024  If 7PM-7AM, please contact night-coverage

## 2024-05-08 LAB — GLUCOSE, CAPILLARY
Glucose-Capillary: 107 mg/dL — ABNORMAL HIGH (ref 70–99)
Glucose-Capillary: 111 mg/dL — ABNORMAL HIGH (ref 70–99)

## 2024-05-08 NOTE — Discharge Summary (Signed)
 Physician Discharge Summary  Colleen Pearson FMW:[id number] DOB: [birth year] DOA: 04/23/2024  PCP: Patient, No Pcp Per  Admit date: 04/23/2024 Discharge date: 05/08/2024  Admitted From: Home  Discharge disposition: Left AGAINST MEDICAL ADVICE   Recommendations for Outpatient Follow-Up:  Follow-up with PCP as outpatient  Discharge Diagnosis:   Principal Problem:   Status epilepticus (HCC) Active Problems:   Seizures (HCC)   Supervision of high risk pregnancy, antepartum   Seizure disorder during pregnancy in third trimester [facility])   Previous cesarean delivery affecting pregnancy, antepartum   Post-operative state   Substance use disorder    Discharge Condition: Left AGAINST MEDICAL ADVICE  Diet recommendation: Left AGAINST MEDICAL ADVICE  Wound care: None.  Code status: Full.   History of Present Illness:   Patient is a 33 years old female G3, P0 with history of epilepsy and polysubstance abuse presented to hospital with several episodes of seizures.  EMS administered magnesium  sulfate and midazolam  and was subsequently lethargic with secretions and was unable to protect her airways and was intubated in the ED.  She was subsequently started on Versed  drip and loaded with Keppra .  There was report of noncompliance with her seizure medications including Keppra  and Lamictal .   She was subsequently intubated and had difficulty weaning from ventilator but was subsequently extubated 05/04/2024.  Was weaned off Precedex  as well and was starting to follow commands so was transferred to medical unit.  Patient does have cortrak tube tube and speech therapy following.  Neuro following and is on Keppra  and Lamictal .   Sequence of events.   11/1: transfer to [facility] from [facility] 11/2 admitted to ICU intubated and sedated signs of fetal distress resulting in emergent C-section 11/3 remains intubated deeply sedated on LTM 11/4 sedation lightened > agitation, back to propofol  11/9 fentanyl   gtt changed to dilaudid gtt, cEEG stopped, MRI no acute abnormality  11/12 Patient failed wean on 05/02/2024- having to be placed on ketamine , propofol  gtt  11/13 Plan to extubate patient this morning, still having agitation  11/14 Extubated on 05/04/2024, was able to tolerate   Hospital Course:   Following conditions were addressed during hospitalization as listed below,  Seizure disorder with status epilepticus likely secondary to medication noncompliance and substance use disorder.  No report of further seizures.  Patient alert awake and oriented.   Multifactorial encephalopathy, agitated delirium, withdrawal-has resolved at this time.  .  EEG was negative for seizure-like activity and continuous EEG was discontinued on 04/30/2024.  MRI of the brain was repeated on 11 9 with no acute abnormality.  CT head on 05/01/2024 was negative for acute findings.  Ammonia  level was -11 8.  Patient was subsequently extubated on 05/04/2024.  Plan is to continue Keppra  and Lamictal  as per neuro. patient states that she has supplies at home.   Hypokalemia, improved after replacement.  Latest potassium of 3.9   Status post emergent C-section at 32 weeks-for fetal distress GYN obstetrics follwed the patient during hospitalization   Acute hypoxic and hypercarbic respiratory failure Aspiration pneumonia Extubated on 11/13 Patient has completed unasyn 11/6.  Patient however developed new temperature leukocytosis and change in secretions on 11/10 and was started on Zosyn .  Subsequently extubated 05/04/2024.  Received pulmonary hygiene, incentive spirometry DuoNebs.  Has completed course of zosyn    Acute renal failure, improved Improved.  Latest creatinine of 0.70   Acute blood loss anemia, postoperatively. Was passing some blood clots.  No further issues.  Hemoglobin today at 10.3.  Hyperlipidemia  LDL was 100, has been started on statins.   At risk for malnutrition Dysphagia. On cortrak tube  feeding.   Speech therapy has seen the patient and recommend n.p.o.  Plan for modified barium swallow.  At this time patient wishes to go home AGAINST MEDICAL ADVICE.  Have advised her that she is likely to get aspiration and further complications but despite that she wishes to leave.    Thrombocytosis.  Significant.  Likely reactive secondary to infection/anemia.     Deconditioning, debility.  Physical therapy has seen the patient and recommended home health PT on discharge but patient wants to leave AGAINST MEDICAL ADVICE   Disposition.  Left AGAINST MEDICAL ADVICE  Medical Consultants:   Critical care  neurology Procedures:    EEG Intubation mechanical ventilation and extubation Subjective:   Today, patient was seen and examined at bedside.  Still having trouble with swallowing as per speech therapy but wishes to go AGAINST MEDICAL ADVICE.  Discharge Exam:   Vitals:   05/07/24 2130 05/08/24 0950  BP: 124/70 120/74  Pulse: 88 (!) 101  Resp: 20 18  Temp: 98.4 F (36.9 C) (!) 97.5 F (36.4 C)  SpO2: 97% 98%   Vitals:   05/07/24 1532 05/07/24 2130 05/08/24 0500 05/08/24 0950  BP: 125/76 124/70  120/74  Pulse: 74 88  (!) 101  Resp: 16 20  18   Temp: 98 F (36.7 C) 98.4 F (36.9 C)  (!) 97.5 F (36.4 C)  TempSrc: Oral Oral  Oral  SpO2: 99% 97%  98%  Weight:   66.2 kg   Height:       GENERAL: Patient is alert awake and oriented. Not in obvious distress.  Cortrak tube tube in place, more calm today. HENT: No scleral pallor or icterus. Pupils equally reactive to light. Oral mucosa is moist NECK: is supple, no gross swelling noted. CHEST: Decreased breath sounds bilaterally.   CVS: S1 and S2 heard, no murmur. Regular rate and rhythm.  ABDOMEN: Soft, non-tender, bowel sounds are present. EXTREMITIES: No edema. CNS: Cranial nerves are intact. No focal motor deficits. SKIN: warm and dry without rashes.  Diaphoretic skin  The results of significant diagnostics from this  hospitalization (including imaging, microbiology, ancillary and laboratory) are listed below for reference.     Diagnostic Studies:   ECHOCARDIOGRAM COMPLETE Result Date: 04/24/2024    ECHOCARDIOGRAM REPORT   Patient Name:   Colleen Pearson [facility] Date of Exam: 04/24/2024 Medical Rec #:  [id number]          Height:       61.0 in Accession #:    [id number]         Weight:       149.5 lb Date of Birth:  1991-04-08          BSA:          1.669 m Patient Age:    33 years           BP:           104/57 mmHg Patient Gender: F                  HR:           68 bpm. Exam Location:  Inpatient Procedure: 2D Echo, Cardiac Doppler, Color Doppler and Intracardiac            Opacification Agent (Both Spectral and Color Flow Doppler were  utilized during procedure). Indications:    Shock R57.9  History:        Patient has prior history of Echocardiogram examinations, most                 recent 03/02/2023. Arrythmias:Tachycardia; Risk                 Factors:Polysubstance Abuse.  Sonographer:    BERNARDA ROCKS Referring Phys: [id number] GRACE E BOWSER IMPRESSIONS  1. Left ventricular ejection fraction, by estimation, is 40 to 45%. Left ventricular ejection fraction by 2D MOD biplane is 44.1 %. The left ventricle has mildly decreased function. The left ventricle demonstrates global hypokinesis. The left ventricular internal cavity size was moderately dilated. Left ventricular diastolic parameters were normal.  2. Right ventricular systolic function is normal. The right ventricular size is normal. There is normal pulmonary artery systolic pressure.  3. The mitral valve is normal in structure. No evidence of mitral valve regurgitation. No evidence of mitral stenosis.  4. The aortic valve was not well visualized. Aortic valve regurgitation is not visualized. No aortic stenosis is present.  5. The inferior vena cava is dilated in size with <50% respiratory variability, suggesting right atrial pressure of 15 mmHg. Comparison(s):  Compared to prior echo, EF is slightly worse and LV is dilated. FINDINGS  Left Ventricle: Left ventricular ejection fraction, by estimation, is 40 to 45%. Left ventricular ejection fraction by 2D MOD biplane is 44.1 %. The left ventricle has mildly decreased function. The left ventricle demonstrates global hypokinesis. Definity contrast agent was given IV to delineate the left ventricular endocardial borders. The left ventricular internal cavity size was moderately dilated. There is no left ventricular hypertrophy. Left ventricular diastolic parameters were normal. Right Ventricle: The right ventricular size is normal. No increase in right ventricular wall thickness. Right ventricular systolic function is normal. There is normal pulmonary artery systolic pressure. The tricuspid regurgitant velocity is 2.10 m/s, and  with an assumed right atrial pressure of 15 mmHg, the estimated right ventricular systolic pressure is 32.6 mmHg. Left Atrium: Left atrial size was normal in size. Right Atrium: Right atrial size was normal in size. Pericardium: There is no evidence of pericardial effusion. Mitral Valve: The mitral valve is normal in structure. No evidence of mitral valve regurgitation. No evidence of mitral valve stenosis. MV peak gradient, 5.8 mmHg. The mean mitral valve gradient is 1.0 mmHg. Tricuspid Valve: The tricuspid valve is normal in structure. Tricuspid valve regurgitation is not demonstrated. No evidence of tricuspid stenosis. Aortic Valve: The aortic valve was not well visualized. Aortic valve regurgitation is not visualized. No aortic stenosis is present. Aortic valve mean gradient measures 3.0 mmHg. Aortic valve peak gradient measures 6.8 mmHg. Aortic valve area, by VTI measures 2.51 cm. Pulmonic Valve: The pulmonic valve was not well visualized. Pulmonic valve regurgitation is not visualized. No evidence of pulmonic stenosis. Aorta: The aortic root is normal in size and structure. Venous: The inferior  vena cava is dilated in size with less than 50% respiratory variability, suggesting right atrial pressure of 15 mmHg. IAS/Shunts: No atrial level shunt detected by color flow Doppler.  LEFT VENTRICLE PLAX 2D                        Biplane EF (MOD) LVIDd:         4.60 cm         LV Biplane EF:   Left LVIDs:  3.00 cm                          ventricular LV PW:         0.60 cm                          ejection LV IVS:        0.70 cm                          fraction by LVOT diam:     1.90 cm                          2D MOD LV SV:         65                               biplane is LV SV Index:   39                               44.1 %. LVOT Area:     2.84 cm                                Diastology                                LV e' medial:    12.00 cm/s LV Volumes (MOD)               LV E/e' medial:  8.9 LV vol d, MOD    135.0 ml      LV e' lateral:   11.60 cm/s A2C:                           LV E/e' lateral: 9.2 LV vol d, MOD    136.0 ml A4C: LV vol s, MOD    73.4 ml A2C: LV vol s, MOD    78.4 ml A4C: LV SV MOD A2C:   61.6 ml LV SV MOD A4C:   136.0 ml LV SV MOD BP:    61.6 ml RIGHT VENTRICLE             IVC RV Basal diam:  3.20 cm     IVC diam: 2.50 cm RV S prime:     11.70 cm/s TAPSE (M-mode): 2.3 cm RVSP:           32.6 mmHg LEFT ATRIUM             Index        RIGHT ATRIUM            Index LA diam:        2.80 cm 1.68 cm/m   RA Pressure: 15.00 mmHg LA Vol (A2C):   42.9 ml 25.71 ml/m  RA Area:     10.90 cm LA Vol (A4C):   24.4 ml 14.62 ml/m  RA Volume:   25.20 ml   15.10 ml/m LA Biplane Vol: 32.4 ml 19.41 ml/m  AORTIC VALVE  PULMONIC VALVE AV Area (Vmax):    2.53 cm     PV Vmax:          0.82 m/s AV Area (Vmean):   2.60 cm     PV Peak grad:     2.7 mmHg AV Area (VTI):     2.51 cm     PR End Diast Vel: 1.40 msec AV Vmax:           130.00 cm/s AV Vmean:          86.000 cm/s AV VTI:            0.259 m AV Peak Grad:      6.8 mmHg AV Mean Grad:      3.0 mmHg LVOT Vmax:          116.00 cm/s LVOT Vmean:        79.000 cm/s LVOT VTI:          0.229 m LVOT/AV VTI ratio: 0.88  AORTA Ao Root diam: 2.10 cm Ao Asc diam:  2.40 cm MITRAL VALVE                TRICUSPID VALVE MV Area (PHT): 3.24 cm     TR Peak grad:   17.6 mmHg MV Area VTI:   1.90 cm     TR Vmax:        210.00 cm/s MV Peak grad:  5.8 mmHg     Estimated RAP:  15.00 mmHg MV Mean grad:  1.0 mmHg     RVSP:           32.6 mmHg MV Vmax:       1.20 m/s MV Vmean:      52.7 cm/s    SHUNTS MV Decel Time: 234 msec     Systemic VTI:  0.23 m MV E velocity: 107.00 cm/s  Systemic Diam: 1.90 cm MV A velocity: 46.50 cm/s MV E/A ratio:  2.30 Franck Azobou Tonleu Electronically signed by Joelle Ren Ny Signature Date/Time: 04/24/2024/1:47:44 PM    Final    Overnight EEG with video Result Date: 04/24/2024 Shelton Arlin KIDD, MD     04/25/2024  9:27 AM Patient Name: Colleen Pearson MRN: [id number] Epilepsy Attending: Arlin KIDD Shelton Referring Physician/Provider: Michaela Aisha SQUIBB, MD Duration: 04/23/2024 1030 to 04/24/2024 1030 Patient history: 33 y.o. female past history of seizure, polysubstance abuse who is [redacted] weeks pregnant, brought in for breakthrough seizure. EEG to evaluate for seizure Level of alertness: comatose/ lethargic AEDs during EEG study: LEV, LTG, Propofol  Technical aspects: This EEG study was done with scalp electrodes positioned according to the 10-20 International system of electrode placement. Electrical activity was reviewed with band pass filter of 1-70Hz , sensitivity of 7 uV/mm, display speed of 37mm/sec with a 60Hz  notched filter applied as appropriate. EEG data were recorded continuously and digitally stored.  Video monitoring was available and reviewed as appropriate. Description: EEG initially showed continuous generalized 3 to 6 Hz theta-delta slowing admixed with 15 to 18 Hz beta activity distributed symmetrically and diffusely. Gradually, EEG evolved into burst attenuation pattern with bursts of 3 to 6 Hz  theta-delta slowing admixed with 12 to 13 Hz beta activity lasting 4 to 5 seconds alternating with 5 to 7 seconds of generalized EEG attenuation. Hyperventilation and photic stimulation were not performed.   ABNORMALITY - Continuous slow, generalized - Burst attenuation, generalized IMPRESSION: This study is suggestive of diffuse cerebral dysfunction (encephalopathy) likely related to sedation. No seizures or epileptiform discharges were  seen throughout the recording. Arlin MALVA Krebs   DG CHEST PORT 1 VIEW Result Date: 04/24/2024 EXAM: 1 VIEW(S) XRAY OF THE CHEST 04/24/2024 05:55:23 AM COMPARISON: 04/23/2024 CLINICAL HISTORY: Endotracheally intubated FINDINGS: LINES, TUBES AND DEVICES: Endotracheal tube in place with tip 3.1 cm above the carina. Enteric tube in place with tip reaching the diaphragm and terminating below the field of view. Right IJ central venous catheter in place with tip projecting over the right atrium. LUNGS AND PLEURA: Slightly improved aeration of right lower lung with persistent patchy airspace opacities. No pulmonary edema. No pleural effusion. No pneumothorax. HEART AND MEDIASTINUM: No acute abnormality of the cardiac and mediastinal silhouettes. BONES AND SOFT TISSUES: No acute osseous abnormality. IMPRESSION: 1. Slightly improved aeration of the right lower lung with persistent patchy airspace opacities. Electronically signed by: Waddell Calk MD 04/24/2024 06:53 AM EST RP Workstation: [id number]   DG CHEST PORT 1 VIEW Result Date: 04/23/2024 CLINICAL DATA:  Central line malfunction EXAM: PORTABLE CHEST 1 VIEW COMPARISON:  04/23/2024 FINDINGS: Single frontal view of the chest demonstrates endotracheal tube overlying tracheal air column, tip at thoracic inlet. Enteric catheter passes below diaphragm, tip and side port projecting over gastric fundus. Right internal jugular catheter tip projects over the atriocaval junction. Cardiac silhouette is unremarkable. Patchy bilateral  perihilar airspace disease again noted. No effusion or pneumothorax. No acute bony abnormalities. IMPRESSION: 1. Support devices as above. 2. Stable patchy bilateral perihilar airspace disease. Electronically Signed   By: Ozell Daring M.D.   On: 04/23/2024 17:25   US  EKG SITE RITE Result Date: 04/23/2024 If Site Rite image not attached, placement could not be confirmed due to current cardiac rhythm.  DG CHEST PORT 1 VIEW Result Date: 04/23/2024 CLINICAL DATA:  Respiratory failure status post intubation EXAM: PORTABLE CHEST 1 VIEW COMPARISON:  Chest radiograph dated 04/22/2024 FINDINGS: Lines/tubes: Endotracheal tube tip projects 1.6 cm above the carina. Gastric/enteric tube tip projects over the stomach. Lungs: Well inflated lungs. Increased bilateral perihilar confluent opacities. Pleura: No pneumothorax or pleural effusion. Heart/mediastinum: Borders are obscured. Bones: No acute osseous abnormality. IMPRESSION: 1. Endotracheal tube tip projects 1.6 cm above the carina. 2. Increased bilateral perihilar confluent opacities, which may represent multifocal aspiration or pneumonia. Electronically Signed   By: Limin  Xu M.D.   On: 04/23/2024 11:17   US  MFM OB LIMITED Result Date: 04/23/2024 ----------------------------------------------------------------------  OBSTETRICS REPORT                        (Signed Final 04/23/2024 07:42 am) ---------------------------------------------------------------------- Patient Info  ID #:       [id number]                          D.O.B.:  07/14/1990 (33 yrs)(F)  Name:       Colleen Pearson [facility]              Visit Date: 04/23/2024 02:49 am ---------------------------------------------------------------------- Performed By  Attending:        Steffan Keys MD         Referred By:       [facility] MAU/Triage  Performed By:     Sharlet LABOR Summer        [facility]  ---------------------------------------------------------------------- Orders  #  Description                           Code        Ordered By  1  US  MFM OB LIMITED                     23184.98    CHARLIE PICKENS ----------------------------------------------------------------------  #  Order #                     Accession #                Episode #  1  [id number]                   [id number]                 [id number] ---------------------------------------------------------------------- Indications  Seizure disorder (Keppra  and Lamictal )          O99.350 G40.909  Drug use complicating pregnancy, third          O99.323  trimester  Cystic Fibrosis (CF) Carrier, third trimester   O09.893  [redacted] weeks gestation of pregnancy                 Z3A.32  Previous cesarean delivery                      O34.219  Poor obstetric history: Previous preterm        O09.219  delivery (36+2 weeks)  Low risk NIPS - NEG AFP ---------------------------------------------------------------------- Fetal Evaluation  Num Of Fetuses:          1  Fetal Heart Rate(bpm):   158  Cardiac Activity:        Observed  Presentation:            Cephalic  Placenta:                Anterior  Amniotic Fluid  AFI FV:      Within normal limits  AFI Sum(cm)     %Tile       Largest Pocket(cm)  18.1            67          5.1  RUQ(cm)       RLQ(cm)       LUQ(cm)        LLQ(cm)  5             4.3           3.7            5.1 ---------------------------------------------------------------------- Biometry  LV:        5.2  mm ---------------------------------------------------------------------- OB History  Gravidity:    4         Term:   0        Prem:   1        SAB:   1  TOP:          1       Ectopic:  0        Living: 1 ---------------------------------------------------------------------- Gestational Age  Best:          32w 0d     Det. By:  Early Ultrasound         EDD:   06/18/24                                      (  12/17/23)  ---------------------------------------------------------------------- Anatomy  Ventricles:            Appears normal         Kidneys:                Rt Kidney appears                                                                        normal  Stomach:               Appears normal, left   Bladder:                Appears normal                         sided ---------------------------------------------------------------------- Cervix Uterus Adnexa  Cervix  Not visualized (advanced GA >24wks) ---------------------------------------------------------------------- Comments  This patient was admitted due to status epilepticus.  A limited ultrasound performed today shows that the fetus is  in the vertex presentation.  There was normal amniotic fluid noted with a total AFI of 18.1  cm.  A normal-appearing anterior placenta was noted. ----------------------------------------------------------------------                   Steffan Keys, MD Electronically Signed Final Report   04/23/2024 07:42 am ----------------------------------------------------------------------   MR BRAIN WO CONTRAST Result Date: 04/23/2024 EXAM: MRI BRAIN WITHOUT CONTRAST 04/23/2024 06:47:10 AM TECHNIQUE: Multiplanar multisequence MRI of the head/brain was performed without the administration of intravenous contrast. COMPARISON: MRI of the head dated 03/19/2023 and CT of the head dated 04/22/2024. CLINICAL HISTORY: Seizure disorder, clinical change. FINDINGS: BRAIN AND VENTRICLES: No acute infarct. No intracranial hemorrhage. No mass. No midline shift. No hydrocephalus. Blooming artifact is present within the globus pallidus bilaterally, secondary to calcification, which was noted on the previous CT. There is no definite finding to explain seizure disorder. The sella is unremarkable. Normal flow voids. ORBITS: No acute abnormality. SINUSES AND MASTOIDS: Mucosal disease is present within the ethmoid, sphenoid, and left maxillary sinuses. BONES AND  SOFT TISSUES: Normal marrow signal. No acute soft tissue abnormality. IMPRESSION: 1. No acute intracranial abnormality to explain seizure disorder. 2. Mucosal disease in the ethmoid, sphenoid, and left maxillary sinuses. Electronically signed by: Evalene Coho MD 04/23/2024 07:31 AM EST RP Workstation: [id number]     Labs:   Basic Metabolic Panel: Recent Labs  Lab 05/03/24 0331 05/04/24 0226 05/05/24 0209 05/05/24 0230 05/06/24 0917 05/07/24 0319  NA 144 140 137 136 135 137  K 4.5 4.4 3.9 3.7 3.1* 3.9  CL 103 103  --  98 101 103  CO2 29 25  --  22 19* 21*  GLUCOSE 121* 117*  --  114* 120* 110*  BUN 17 22*  --  23* 22* 18  CREATININE 0.86 0.79  --  0.62 0.63 0.70  CALCIUM  8.2* 8.3*  --  8.5* 8.0* 8.3*  MG  --   --   --  1.8 1.9 1.9  PHOS  --   --   --  3.0 3.1 2.7   GFR Estimated Creatinine Clearance: 84.9 mL/min (by C-G formula based on SCr of 0.7 mg/dL). Liver Function Tests: Recent Labs  Lab 05/06/24  0917  AST 28  ALT 28  ALKPHOS 114  BILITOT 0.7  PROT 6.5  ALBUMIN 2.1*   No results for input(s): LIPASE, AMYLASE in the last 168 hours. No results for input(s): AMMONIA  in the last 168 hours. Coagulation profile No results for input(s): INR, PROTIME in the last 168 hours.  CBC: Recent Labs  Lab 05/03/24 1537 05/04/24 0226 05/05/24 0209 05/05/24 0230 05/06/24 0917 05/07/24 0319  WBC 14.3* 12.5*  --  12.7* 10.0 11.6*  HGB 10.6* 9.8* 9.2* 9.8* 10.9* 10.3*  HCT 32.6* 30.8* 27.0* 29.3* 32.7* 30.0*  MCV 88.3 89.3  --  84.4 84.3 83.3  PLT 616* 564*  --  627* 816* 929*   Cardiac Enzymes: No results for input(s): CKTOTAL, CKMB, CKMBINDEX, TROPONINI in the last 168 hours. BNP: Invalid input(s): POCBNP CBG: Recent Labs  Lab 05/07/24 1625 05/07/24 1939 05/07/24 2313 05/08/24 0340 05/08/24 1248  GLUCAP 115* 105* 141* 107* 111*   D-Dimer No results for input(s): DDIMER in the last 72 hours. Hgb A1c No results for input(s): HGBA1C  in the last 72 hours. Lipid Profile Recent Labs    05/06/24 0516  TRIG 195*   Thyroid function studies No results for input(s): TSH, T4TOTAL, T3FREE, THYROIDAB in the last 72 hours.  Invalid input(s): FREET3 Anemia work up No results for input(s): VITAMINB12, FOLATE, FERRITIN, TIBC, IRON, RETICCTPCT in the last 72 hours. Microbiology Recent Results (from the past 240 hours)  Culture, Respiratory w Gram Stain     Status: None   Collection Time: 05/01/24  1:15 PM   Specimen: Tracheal Aspirate; Respiratory  Result Value Ref Range Status   Specimen Description TRACHEAL ASPIRATE  Final   Special Requests NONE  Final   Gram Stain NO WBC SEEN NO ORGANISMS SEEN   Final   Culture   Final    RARE Normal respiratory flora-no Staph aureus or Pseudomonas seen Performed at [facility], [address] [address], [place], [place] [address]    Report Status 05/04/2024 FINAL  Final  Respiratory (~20 pathogens) panel by PCR     Status: None   Collection Time: 05/02/24  2:26 PM   Specimen: Nasopharyngeal Swab; Respiratory  Result Value Ref Range Status   Adenovirus NOT DETECTED NOT DETECTED Final   Coronavirus 229E NOT DETECTED NOT DETECTED Final    Comment: (NOTE) The Coronavirus on the Respiratory Panel, DOES NOT test for the novel  Coronavirus (2019 nCoV)    Coronavirus HKU1 NOT DETECTED NOT DETECTED Final   Coronavirus NL63 NOT DETECTED NOT DETECTED Final   Coronavirus OC43 NOT DETECTED NOT DETECTED Final   Metapneumovirus NOT DETECTED NOT DETECTED Final   Rhinovirus / Enterovirus NOT DETECTED NOT DETECTED Final   Influenza A NOT DETECTED NOT DETECTED Final   Influenza B NOT DETECTED NOT DETECTED Final   Parainfluenza Virus 1 NOT DETECTED NOT DETECTED Final   Parainfluenza Virus 2 NOT DETECTED NOT DETECTED Final   Parainfluenza Virus 3 NOT DETECTED NOT DETECTED Final   Parainfluenza Virus 4 NOT DETECTED NOT DETECTED Final   Respiratory Syncytial Virus NOT DETECTED  NOT DETECTED Final   Bordetella pertussis NOT DETECTED NOT DETECTED Final   Bordetella Parapertussis NOT DETECTED NOT DETECTED Final   Chlamydophila pneumoniae NOT DETECTED NOT DETECTED Final   Mycoplasma pneumoniae NOT DETECTED NOT DETECTED Final    Comment: Performed at [facility], [address], [place], [place] [address]     Discharge Instructions:    Left AGAINST MEDICAL ADVICE  Time coordinating discharge: 39 minutes  Signed:  Fritzi Scripter  [facility] 05/08/2024, 1:22 PM

## 2024-05-08 NOTE — Social Work (Signed)
 [place] CPS social worker Randa H.) contacted CSW and stated that she has been trying to contact MOB but was unable reach her over the phone. MOB was present at bedside visiting her infant. MOB agreed to speak with CPS social worker on CSW's phone. CPS social worker provided MOB with an update and stated that she would be visiting MOB and her infant today. MOB asked questions, verbalized understanding and then ended the call.   CSW inquired how MOB had feeling. MOB reported that she was feeling ok and endorsed having postpartum depression symptoms. MOB shared that she was ready to be discharged from the hospital and be with her infant. CSW validated MOB's feelings.CSW offered encouragement and offered support while the infant remains in the NICU. CSW discussed mental health support services and substance use services offered in [place]. MOB verbalized understanding and expressed appreciation for the resources. CSW assessed for safety. MOB denied SI/HI. CSW inquired if the NICU staff had been keeping her well-informed about her infant's care. MOB reported that the NICU staff had provided her with an update on infant. CSW assessed MOB for additional needs. MOB reported none.   Nat Quiet, MSW, LCSW Clinical Social Worker  [phone] 05/08/2024  11:38 AM

## 2024-05-08 NOTE — TOC Progression Note (Signed)
 Transition of Care [facility]) - Progression Note    Patient Details  Name: Colleen Pearson MRN: [id number] Date of Birth: 03-22-1991  [facility] of [facility]) CM/SW Contact  Andrez JULIANNA George, RN Phone Number: 05/08/2024, 10:42 AM  Clinical Narrative:     Pt continues with cortrak. CPS involved.  IP Care management following.  Expected Discharge Plan: Home w Home Health Services                 Expected Discharge Plan and Services                                               Social Drivers of Health (SDOH) Interventions SDOH Screenings   Food Insecurity: No Food Insecurity (04/27/2024)  Housing: Unknown (04/27/2024)  Transportation Needs: Patient Unable To Answer (04/27/2024)  Utilities: Patient Unable To Answer (04/27/2024)  Alcohol Screen: Low Risk  (12/27/2023)  Depression (PHQ2-9): Low Risk  (12/27/2023)  Financial Resource Strain: Medium Risk (12/27/2023)  Physical Activity: Inactive (12/27/2023)  Social Connections: Unknown (12/27/2023)  Stress: No Stress Concern Present (12/27/2023)  Tobacco Use: Medium Risk (04/23/2024)  Health Literacy: Adequate Health Literacy (12/27/2023)    Readmission Risk Interventions    03/03/2023    3:16 PM  Readmission Risk Prevention Plan  Medication Screening Complete  Transportation Screening Complete

## 2024-05-08 NOTE — Progress Notes (Signed)
 Speech Language Pathology Treatment: Dysphagia  Patient Details Name: Colleen Pearson MRN: [id number] DOB: 30-Jun-1990 Today's Date: 05/08/2024 Time: 1227-1250 SLP Time Calculation (min) (ACUTE ONLY): 23 min  Assessment / Plan / Recommendation Clinical Impression  Pt is demonstrating slight improvements related to vocal quality and impulsivity. Her vocal quality remains hoarse and while her cough is forceful, it lacks crispness. She independently provided oral care and took sips of thin liquids, resulting in prolonged coughing. Pt repeatedly states she will be leaving the hospital AMA today and verbalizes understanding of aspiration risk. Discussed that since her vocal quality has not improved, this SLP suspects that repeating an instrumental swallow study may yield similar results and indicate the need for thickened liquids, which pt refuses to drink.   While she is at increased risk for aspiration-related adverse events secondary to prolonged intubation, she is overall mobile and independent. Education was provided on aspiration precautions and pt states she will complete oral care before PO intake. It is recommended that she remain NPO, allowing more time post-extubation before repeating instrumental testing but SLP was alerted by MD that pt is leaving AMA. Recommend she resume a regular diet with thin liquids at time of discharge, adhering to strict aspiration precautions. She can return for repeat testing on an OP basis in 1-2 weeks if desired. Will continue following as able.    HPI HPI: 33 yo female with history of epilepsy and substance use disorder who presented to [facility] 11/1 after having several seizures. Intubated due to reduced secretion management, CXR showed patchy opacities consistent with bilateral bronchopneumonia. ETT 11/1-11/13. S/p emergent C-section at 32 weeks 11/2. UDS + for cocaine and THC. EEG negative for seizure activity, d/c 11/9. MRI negative for acute abnormality. Cortrak  placed 11/12. Seen by SLP during admission for seizures s/p 5 day intubation for dysphagia. MBS 03/01/23 showed silent aspiration of thin and nectar thick liquids, recommending a Dys diet with honey thick liquids before being advanced to a regular diet with thin liquids as mentation improved (MBS 03/09/23 also showed improved function with only transient penetration of thin liquids).      SLP Plan  Continue with current plan of care          Recommendations  Diet recommendations: NPO Liquids provided via: Cup;Straw Medication Administration: Via alternative means                  Oral care QID;Oral care prior to ice chip/H20   Frequent or constant Supervision/Assistance Dysphagia, pharyngeal phase (R13.13)     Continue with current plan of care     Damien Blumenthal, M.A., CCC-SLP Speech Language Pathology, Acute Rehabilitation Services  Secure Chat preferred [phone]   05/08/2024, 12:58 PM

## 2024-05-08 NOTE — Progress Notes (Addendum)
 PROGRESS NOTE  LOWELLA KINDLEY FMW:[id number] DOB: 15-Sep-1990 DOA: 04/23/2024 PCP: Patient, No Pcp Per   LOS: 15 days   Brief narrative:  Patient is a 33 years old female G3, P0 with history of epilepsy and polysubstance abuse presented to hospital with several episodes of seizures.  EMS administered magnesium  sulfate and midazolam  and was subsequently lethargic with secretions and was unable to protect her airways and was intubated in the ED.  She was subsequently started on Versed  drip and loaded with Keppra .  There was report of noncompliance with her seizure medications including Keppra  and Lamictal .   She was subsequently intubated and had difficulty weaning from ventilator but was subsequently extubated 05/04/2024.  Was weaned off Precedex  as well and was starting to follow commands so was transferred to medical unit.  Patient does have cortrak tube tube and speech therapy following.  Neuro following and is on Keppra  and Lamictal .  Sequence of events.  11/1: transfer to [facility] from [facility] 11/2 admitted to ICU intubated and sedated signs of fetal distress resulting in emergent C-section 11/3 remains intubated deeply sedated on LTM 11/4 sedation lightened > agitation, back to propofol  11/9 fentanyl  gtt changed to dilaudid gtt, cEEG stopped, MRI no acute abnormality  11/12 Patient failed wean on 05/02/2024- having to be placed on ketamine , propofol  gtt  11/13 Plan to extubate patient this morning, still having agitation  11/14 Extubated on 05/04/2024, was able to tolerate 11/17-    Assessment/Plan: Principal Problem:   Status epilepticus (HCC) Active Problems:   Seizures (HCC)   Supervision of high risk pregnancy, antepartum   Seizure disorder during pregnancy in third trimester [facility])   Previous cesarean delivery affecting pregnancy, antepartum   Post-operative state   Substance use disorder  Seizure disorder with status epilepticus likely secondary to medication noncompliance and  substance use disorder.  No report of further seizures.  Patient alert awake and oriented.  Multifactorial encephalopathy, agitated delirium, withdrawal-has resolved at this time.  .  EEG was negative for seizure-like activity and continuous EEG was discontinued on 04/30/2024.  MRI of the brain was repeated on 11 9 with no acute abnormality.  CT head on 05/01/2024 was negative for acute findings.  Ammonia  level was -11 8.  Patient was subsequently extubated on 05/04/2024.  Plan is to continue Keppra  and Lamictal  as per neuro.    Hypokalemia, improved after replacement.  Latest potassium of 3.9   Status post emergent C-section at 32 weeks-for fetal distress Obstetrics following the patient   Acute hypoxic and hypercarbic respiratory failure Aspiration pneumonia Extubated on 11/13 Patient has completed unasyn 11/6.  Patient however developed new temperature leukocytosis and change in secretions on 11/10 and was started on Zosyn .  Subsequently extubated 05/04/2024.  Continue pulmonary hygiene, incentive spirometry DuoNebs.  Has completed course of zosyn    Acute renal failure, improved Improved.  Latest creatinine of 0.70  Acute blood loss anemia, postoperatively. Was passing some blood clots.  No further issues.  Hemoglobin today at 10.3.   Hyperlipidemia  LDL was 100, has been started on statins.   At risk for malnutrition On cortrak tube feeding.  Check speech and swallow evaluation.  Speech therapy has seen the patient and recommend n.p.o.  Plan for modified barium swallow.  Advance diet as able.  Communicated with the speech therapy again for reevaluation.  Thrombocytosis.  Significant.  Likely reactive secondary to infection/anemia.  Will need to continue to monitor  Deconditioning, debility.  Physical therapy has seen the patient and  recommended home health PT on discharge.  DVT prophylaxis: heparin  injection 5,000 Units Start: 04/23/24 1400 SCD's Start: 04/23/24  1243   Disposition: Likely home with home health in 1 to 2 days when p.o. okay  Status is: Inpatient Remains inpatient appropriate because: Pending clinical improvement,    Code Status:     Code Status: Full Code  Family Communication: Spoke with the patient's mother at bedside 05/06/2024  Consultants: Critical care Neurology  Procedures: EEG  Anti-infectives:  Zosyn  IV-completed  Anti-infectives (From admission, onward)    Start     Dose/Rate Route Frequency Ordered Stop   05/04/24 1800  vancomycin  (VANCOREADY) IVPB 1500 mg/300 mL  Status:  Discontinued        1,500 mg 150 mL/hr over 120 Minutes Intravenous Every 24 hours 05/03/24 1733 05/05/24 1042   05/03/24 1800  vancomycin  (VANCOCIN ) 1,500 mg in sodium chloride  0.9 % 500 mL IVPB  Status:  Discontinued        1,500 mg 257.5 mL/hr over 120 Minutes Intravenous  Once 05/03/24 1709 05/03/24 1710   05/03/24 1800  vancomycin  (VANCOREADY) IVPB 1500 mg/300 mL        1,500 mg 150 mL/hr over 120 Minutes Intravenous  Once 05/03/24 1710 05/04/24 0007   05/03/24 1745  vancomycin  (VANCOREADY) IVPB 1500 mg/300 mL  Status:  Discontinued        1,500 mg 150 mL/hr over 120 Minutes Intravenous  Once 05/03/24 1647 05/03/24 1709   05/01/24 0900  piperacillin -tazobactam (ZOSYN ) IVPB 3.375 g        3.375 g 12.5 mL/hr over 240 Minutes Intravenous Every 8 hours 05/01/24 0855 05/07/24 2252   04/23/24 0836  clindamycin  (CLEOCIN ) IVPB 900 mg       Placed in And Linked Group   900 mg 100 mL/hr over 30 Minutes Intravenous 60 min pre-op 04/23/24 0836 04/23/24 0849   04/23/24 0836  gentamicin  (GARAMYCIN ) 290 mg in dextrose  5 % 100 mL IVPB       Placed in And Linked Group   5 mg/kg  57 kg (Adjusted) 107.3 mL/hr over 60 Minutes Intravenous 60 min pre-op 04/23/24 0836 04/23/24 1133   04/23/24 0823  ceFAZolin  (ANCEF ) IVPB 2g/100 mL premix  Status:  Discontinued        2 g 200 mL/hr over 30 Minutes Intravenous 30 min pre-op 04/23/24 0823  04/23/24 0958   04/23/24 0400  Ampicillin-Sulbactam (UNASYN) 3 g in sodium chloride  0.9 % 100 mL IVPB        3 g 200 mL/hr over 30 Minutes Intravenous Every 6 hours 04/23/24 0247 04/28/24 0918        Subjective: Today, patient was seen and examined at bedside.  He is more calm and composed.  Denies any nausea vomiting abdominal pain.  No shortness of breath cough or fever.  Objective: Vitals:   05/07/24 2130 05/08/24 0950  BP: 124/70 120/74  Pulse: 88 (!) 101  Resp: 20 18  Temp: 98.4 F (36.9 C) (!) 97.5 F (36.4 C)  SpO2: 97% 98%    Intake/Output Summary (Last 24 hours) at 05/08/2024 1122 Last data filed at 05/08/2024 0552 Gross per 24 hour  Intake 400 ml  Output --  Net 400 ml   Filed Weights   05/02/24 0415 05/03/24 0500 05/08/24 0500  Weight: 73.6 kg 65 kg 66.2 kg   Body mass index is 28.5 kg/m.   Physical Exam:  GENERAL: Patient is alert awake and oriented. Not in obvious distress.  Cortrak tube tube  in place, more calm today. HENT: No scleral pallor or icterus. Pupils equally reactive to light. Oral mucosa is moist NECK: is supple, no gross swelling noted. CHEST: Decreased breath sounds bilaterally.   CVS: S1 and S2 heard, no murmur. Regular rate and rhythm.  ABDOMEN: Soft, non-tender, bowel sounds are present. EXTREMITIES: No edema. CNS: Cranial nerves are intact. No focal motor deficits. SKIN: warm and dry without rashes.  Diaphoretic skin.  Data Review: I have personally reviewed the following laboratory data and studies,  CBC: Recent Labs  Lab 05/03/24 1537 05/04/24 0226 05/05/24 0209 05/05/24 0230 05/06/24 0917 05/07/24 0319  WBC 14.3* 12.5*  --  12.7* 10.0 11.6*  HGB 10.6* 9.8* 9.2* 9.8* 10.9* 10.3*  HCT 32.6* 30.8* 27.0* 29.3* 32.7* 30.0*  MCV 88.3 89.3  --  84.4 84.3 83.3  PLT 616* 564*  --  627* 816* 929*   Basic Metabolic Panel: Recent Labs  Lab 05/03/24 0331 05/04/24 0226 05/05/24 0209 05/05/24 0230 05/06/24 0917  05/07/24 0319  NA 144 140 137 136 135 137  K 4.5 4.4 3.9 3.7 3.1* 3.9  CL 103 103  --  98 101 103  CO2 29 25  --  22 19* 21*  GLUCOSE 121* 117*  --  114* 120* 110*  BUN 17 22*  --  23* 22* 18  CREATININE 0.86 0.79  --  0.62 0.63 0.70  CALCIUM  8.2* 8.3*  --  8.5* 8.0* 8.3*  MG  --   --   --  1.8 1.9 1.9  PHOS  --   --   --  3.0 3.1 2.7   Liver Function Tests: Recent Labs  Lab 05/06/24 0917  AST 28  ALT 28  ALKPHOS 114  BILITOT 0.7  PROT 6.5  ALBUMIN 2.1*   No results for input(s): LIPASE, AMYLASE in the last 168 hours. No results for input(s): AMMONIA  in the last 168 hours. Cardiac Enzymes: No results for input(s): CKTOTAL, CKMB, CKMBINDEX, TROPONINI in the last 168 hours. BNP (last 3 results) Recent Labs    05/02/24 1431  BNP 116.0*    ProBNP (last 3 results) No results for input(s): PROBNP in the last 8760 hours.  CBG: Recent Labs  Lab 05/07/24 1251 05/07/24 1625 05/07/24 1939 05/07/24 2313 05/08/24 0340  GLUCAP 143* 115* 105* 141* 107*   Recent Results (from the past 240 hours)  Culture, Respiratory w Gram Stain     Status: None   Collection Time: 05/01/24  1:15 PM   Specimen: Tracheal Aspirate; Respiratory  Result Value Ref Range Status   Specimen Description TRACHEAL ASPIRATE  Final   Special Requests NONE  Final   Gram Stain NO WBC SEEN NO ORGANISMS SEEN   Final   Culture   Final    RARE Normal respiratory flora-no Staph aureus or Pseudomonas seen Performed at [facility], [address], [place], [place] [address]    Report Status 05/04/2024 FINAL  Final  Respiratory (~20 pathogens) panel by PCR     Status: None   Collection Time: 05/02/24  2:26 PM   Specimen: Nasopharyngeal Swab; Respiratory  Result Value Ref Range Status   Adenovirus NOT DETECTED NOT DETECTED Final   Coronavirus 229E NOT DETECTED NOT DETECTED Final    Comment: (NOTE) The Coronavirus on the Respiratory Panel, DOES NOT test for the novel  Coronavirus  (2019 nCoV)    Coronavirus HKU1 NOT DETECTED NOT DETECTED Final   Coronavirus NL63 NOT DETECTED NOT DETECTED Final  Coronavirus OC43 NOT DETECTED NOT DETECTED Final   Metapneumovirus NOT DETECTED NOT DETECTED Final   Rhinovirus / Enterovirus NOT DETECTED NOT DETECTED Final   Influenza A NOT DETECTED NOT DETECTED Final   Influenza B NOT DETECTED NOT DETECTED Final   Parainfluenza Virus 1 NOT DETECTED NOT DETECTED Final   Parainfluenza Virus 2 NOT DETECTED NOT DETECTED Final   Parainfluenza Virus 3 NOT DETECTED NOT DETECTED Final   Parainfluenza Virus 4 NOT DETECTED NOT DETECTED Final   Respiratory Syncytial Virus NOT DETECTED NOT DETECTED Final   Bordetella pertussis NOT DETECTED NOT DETECTED Final   Bordetella Parapertussis NOT DETECTED NOT DETECTED Final   Chlamydophila pneumoniae NOT DETECTED NOT DETECTED Final   Mycoplasma pneumoniae NOT DETECTED NOT DETECTED Final    Comment: Performed at [facility], [address] [place], [place] [address]     Studies: No results found.     Vernal Alstrom, MD  [facility] 05/08/2024  If 7PM-7AM, please contact night-coverage

## 2024-05-08 NOTE — Social Work (Addendum)
 CSW escorted CPS social worker Randa MATSU.) to MOB's room [phone] to complete an assessment.   RN informed CSW that MOB has requested to leave AMA and asked if CSW could talk with her. CSW encouraged MOB to continue hospital treatment. MOB insisted that she was ready to leave the hospital.   Nat Quiet, MSW, LCSW Clinical Social Worker  [phone] 05/08/2024  1:36 PM

## 2024-05-08 NOTE — Plan of Care (Signed)
  Problem: Coping: Goal: Ability to adjust to condition or change in health will improve Outcome: Progressing   Problem: Fluid Volume: Goal: Ability to maintain a balanced intake and output will improve Outcome: Progressing   Problem: Health Behavior/Discharge Planning: Goal: Ability to identify and utilize available resources and services will improve Outcome: Progressing Goal: Ability to manage health-related needs will improve Outcome: Progressing   Problem: Metabolic: Goal: Ability to maintain appropriate glucose levels will improve Outcome: Progressing   Problem: Nutritional: Goal: Maintenance of adequate nutrition will improve Outcome: Progressing Goal: Progress toward achieving an optimal weight will improve Outcome: Progressing   Problem: Skin Integrity: Goal: Risk for impaired skin integrity will decrease Outcome: Progressing   Problem: Tissue Perfusion: Goal: Adequacy of tissue perfusion will improve Outcome: Progressing   Problem: Education: Goal: Knowledge of General Education information will improve Description: Including pain rating scale, medication(s)/side effects and non-pharmacologic comfort measures Outcome: Progressing   Problem: Health Behavior/Discharge Planning: Goal: Ability to manage health-related needs will improve Outcome: Progressing   Problem: Clinical Measurements: Goal: Ability to maintain clinical measurements within normal limits will improve Outcome: Progressing Goal: Will remain free from infection Outcome: Progressing Goal: Diagnostic test results will improve Outcome: Progressing Goal: Respiratory complications will improve Outcome: Progressing Goal: Cardiovascular complication will be avoided Outcome: Progressing   Problem: Activity: Goal: Risk for activity intolerance will decrease Outcome: Progressing   Problem: Nutrition: Goal: Adequate nutrition will be maintained Outcome: Progressing   Problem: Coping: Goal:  Level of anxiety will decrease Outcome: Progressing   Problem: Elimination: Goal: Will not experience complications related to bowel motility Outcome: Progressing Goal: Will not experience complications related to urinary retention Outcome: Progressing   Problem: Pain Managment: Goal: General experience of comfort will improve and/or be controlled Outcome: Progressing   Problem: Safety: Goal: Ability to remain free from injury will improve Outcome: Progressing   Problem: Skin Integrity: Goal: Risk for impaired skin integrity will decrease Outcome: Progressing   Problem: Education: Goal: Knowledge of the prescribed therapeutic regimen will improve Outcome: Progressing Goal: Understanding of sexual limitations or changes related to disease process or condition will improve Outcome: Progressing   Problem: Self-Concept: Goal: Communication of feelings regarding changes in body function or appearance will improve Outcome: Progressing   Problem: Skin Integrity: Goal: Demonstration of wound healing without infection will improve Outcome: Progressing   Problem: Education: Goal: Knowledge of condition will improve Outcome: Progressing   Problem: Activity: Goal: Will verbalize the importance of balancing activity with adequate rest periods Outcome: Progressing Goal: Ability to tolerate increased activity will improve Outcome: Progressing   Problem: Coping: Goal: Ability to identify and utilize available resources and services will improve Outcome: Progressing   Problem: Life Cycle: Goal: Chance of risk for complications during the postpartum period will decrease Outcome: Progressing   Problem: Role Relationship: Goal: Ability to demonstrate positive interaction with newborn will improve Outcome: Progressing   Problem: Skin Integrity: Goal: Demonstration of wound healing without infection will improve Outcome: Progressing

## 2024-05-08 NOTE — Progress Notes (Signed)
 Patient left AMA and signed the AMA papers. Cortrak and iv removed. MD aware.

## 2024-05-09 ENCOUNTER — Ambulatory Visit

## 2024-05-09 ENCOUNTER — Telehealth (HOSPITAL_COMMUNITY): Payer: Self-pay | Admitting: *Deleted

## 2024-05-09 NOTE — Telephone Encounter (Signed)
 Attempted hospital discharge follow-up call. Message received stating, Call cannot be completed as dialed. Allean IVAR Carton, RN, 05/09/24, RETHA

## 2024-05-16 ENCOUNTER — Other Ambulatory Visit

## 2024-05-16 ENCOUNTER — Ambulatory Visit

## 2024-06-02 ENCOUNTER — Other Ambulatory Visit: Payer: Self-pay

## 2024-06-12 ENCOUNTER — Inpatient Hospital Stay: Admit: 2024-06-12 | Admitting: Obstetrics

## 2024-06-12 SURGERY — Surgical Case
Anesthesia: Choice
